# Patient Record
Sex: Female | Born: 1960 | Hispanic: Yes | Marital: Married | Smoking: Never smoker
Health system: Southern US, Community
[De-identification: ages and names within clinical notes are randomized; demographics above are authoritative.]

## PROBLEM LIST (undated history)

## (undated) DIAGNOSIS — C801 Malignant (primary) neoplasm, unspecified: Secondary | ICD-10-CM

## (undated) DIAGNOSIS — K219 Gastro-esophageal reflux disease without esophagitis: Secondary | ICD-10-CM

## (undated) DIAGNOSIS — J45909 Unspecified asthma, uncomplicated: Secondary | ICD-10-CM

## (undated) DIAGNOSIS — I10 Essential (primary) hypertension: Secondary | ICD-10-CM

## (undated) DIAGNOSIS — R911 Solitary pulmonary nodule: Secondary | ICD-10-CM

## (undated) HISTORY — DX: Essential (primary) hypertension: I10

## (undated) HISTORY — DX: Solitary pulmonary nodule: R91.1

## (undated) HISTORY — DX: Unspecified asthma, uncomplicated: J45.909

## (undated) HISTORY — PX: TUBAL LIGATION: SHX77

---

## 2015-08-16 ENCOUNTER — Encounter (HOSPITAL_COMMUNITY): Payer: Self-pay

## 2015-08-16 ENCOUNTER — Ambulatory Visit (HOSPITAL_COMMUNITY)
Admission: RE | Admit: 2015-08-16 | Discharge: 2015-08-16 | Disposition: A | Discharge status: Home / Self Care | Payer: Self-pay | Source: Ambulatory Visit | Attending: Obstetrics and Gynecology | Admitting: Obstetrics and Gynecology

## 2015-08-16 VITALS — BP 140/94 | Temp 97.7°F | Ht 59.84 in | Wt 239.4 lb

## 2015-08-16 DIAGNOSIS — N6315 Unspecified lump in the right breast, overlapping quadrants: Secondary | ICD-10-CM

## 2015-08-16 DIAGNOSIS — Z01419 Encounter for gynecological examination (general) (routine) without abnormal findings: Secondary | ICD-10-CM

## 2015-08-16 DIAGNOSIS — N6310 Unspecified lump in the right breast, unspecified quadrant: Secondary | ICD-10-CM

## 2015-08-16 DIAGNOSIS — N6320 Unspecified lump in the left breast, unspecified quadrant: Secondary | ICD-10-CM

## 2015-08-16 NOTE — Patient Instructions (Addendum)
Educational materials on self breast awareness given. Explained to Felicia [name] that [facility] will cover Pap smears and HPV typing every 5 years unless has a history of abnormal Pap smears. Referred patient to [facility] for bilateral breast biopsies per recommendation. Appointment scheduled for Monday, August 20, 2015 at 1200. Patient aware of appointment and will be there. Let patient know will follow up with her within the next couple weeks with results with of Pap smear by phone. Felicia [name] verbalized understanding.  [place], Arvil Chaco, RN 3:12 PM

## 2015-08-16 NOTE — Progress Notes (Signed)
Patient referred to [facility] by [facility] due to recommending bilateral breast biopsies. Diagnostic mammogram and bilateral breast ultrasounds completed 08/16/2015.  Pap Smear: Pap smear completed today. Last Pap smear was in 2012 in [place] and normal per patient. Per patient has no history of an abnormal Pap smear. No Pap smear results in EPIC.  Physical exam: Breasts Breasts symmetrical. No skin abnormalities right breast. Left inner breast erythema observed on exam. No nipple retraction left breast. Right nipple inverted that per patient first noticed 2 years ago. No nipple discharge bilateral breasts. No lymphadenopathy. Palpated a left breast mass at the lower inner quadrant of breast. Palpated a right breast mass at 12 o'clock. No complaints of pain or tenderness on exam. Referred patient to [facility] for bilateral breast biopsies per recommendation. Appointment scheduled for Monday, August 20, 2015 at 1200.  Pelvic/Bimanual   Ext Genitalia No lesions, no swelling and no discharge observed on external genitalia.         Vagina Vagina pink and normal texture. No lesions or discharge observed in vagina.          Cervix Cervix is present. Cervix pink and of normal texture. No discharge observed.     Uterus Uterus is present and palpable. Uterus in normal position and normal size.        Adnexae Bilateral ovaries present and palpable. No tenderness on palpation.          Rectovaginal No rectal exam completed today since patient had no rectal complaints. No skin abnormalities observed on exam.    Smoking History: Patient has never smoked.  Patient Navigation: Patient education provided. Access to services provided for patient through [facility] program. Spanish interpreter provided.  Colorectal Cancer Screening: Patient has never had a colonoscopy. No complaints today.  Used Spanish interpreter Cresenciano Genre at [facility]

## 2015-08-17 ENCOUNTER — Encounter (HOSPITAL_COMMUNITY): Payer: Self-pay | Admitting: *Deleted

## 2015-08-17 LAB — CYTOLOGY - PAP

## 2015-08-20 ENCOUNTER — Telehealth (HOSPITAL_COMMUNITY): Payer: Self-pay | Admitting: *Deleted

## 2015-08-20 ENCOUNTER — Other Ambulatory Visit: Payer: Self-pay | Admitting: Radiology

## 2015-08-20 NOTE — Telephone Encounter (Signed)
Telephoned patient at home # and discussed negative pap smear results. HPV was negative. Next pap smear due in 5 years. Patient voiced understanding.  

## 2015-08-21 ENCOUNTER — Telehealth: Payer: Self-pay | Admitting: *Deleted

## 2015-08-21 NOTE — Telephone Encounter (Signed)
Received notification from [facility] that all six of the patients biopsies are positive for breast cancer. [facility] will provide the pt will new pt appt to see Dr. Lindi Adie on 08/23/15 at 1pm. Pt arriving at 1pm at [facility] to discuss new pt appt and biopsy results.

## 2015-08-23 ENCOUNTER — Telehealth: Payer: Self-pay | Admitting: Hematology and Oncology

## 2015-08-23 ENCOUNTER — Encounter: Payer: Self-pay | Admitting: Hematology and Oncology

## 2015-08-23 ENCOUNTER — Encounter: Payer: Self-pay | Admitting: *Deleted

## 2015-08-23 ENCOUNTER — Other Ambulatory Visit (HOSPITAL_BASED_OUTPATIENT_CLINIC_OR_DEPARTMENT_OTHER): Payer: Self-pay

## 2015-08-23 ENCOUNTER — Ambulatory Visit (HOSPITAL_BASED_OUTPATIENT_CLINIC_OR_DEPARTMENT_OTHER): Payer: Self-pay | Admitting: Hematology and Oncology

## 2015-08-23 ENCOUNTER — Ambulatory Visit
Admission: RE | Admit: 2015-08-23 | Discharge: 2015-08-23 | Disposition: A | Discharge status: Home / Self Care | Payer: Self-pay | Source: Ambulatory Visit | Attending: Hematology and Oncology | Admitting: Hematology and Oncology

## 2015-08-23 VITALS — BP 174/82 | HR 89 | Temp 97.9°F | Resp 18 | Wt 238.9 lb

## 2015-08-23 DIAGNOSIS — C50412 Malignant neoplasm of upper-outer quadrant of left female breast: Secondary | ICD-10-CM

## 2015-08-23 DIAGNOSIS — C50411 Malignant neoplasm of upper-outer quadrant of right female breast: Secondary | ICD-10-CM

## 2015-08-23 DIAGNOSIS — C50811 Malignant neoplasm of overlapping sites of right female breast: Secondary | ICD-10-CM

## 2015-08-23 DIAGNOSIS — C50012 Malignant neoplasm of nipple and areola, left female breast: Secondary | ICD-10-CM | POA: Insufficient documentation

## 2015-08-23 DIAGNOSIS — C50812 Malignant neoplasm of overlapping sites of left female breast: Principal | ICD-10-CM

## 2015-08-23 DIAGNOSIS — C50912 Malignant neoplasm of unspecified site of left female breast: Secondary | ICD-10-CM

## 2015-08-23 DIAGNOSIS — Z170 Estrogen receptor positive status [ER+]: Secondary | ICD-10-CM | POA: Insufficient documentation

## 2015-08-23 DIAGNOSIS — C50911 Malignant neoplasm of unspecified site of right female breast: Secondary | ICD-10-CM

## 2015-08-23 DIAGNOSIS — C773 Secondary and unspecified malignant neoplasm of axilla and upper limb lymph nodes: Secondary | ICD-10-CM

## 2015-08-23 DIAGNOSIS — C50011 Malignant neoplasm of nipple and areola, right female breast: Secondary | ICD-10-CM | POA: Insufficient documentation

## 2015-08-23 LAB — CBC WITH DIFFERENTIAL/PLATELET
BASO%: 0.4 % (ref 0.0–2.0)
Basophils Absolute: 0 10*3/uL (ref 0.0–0.1)
EOS%: 1.2 % (ref 0.0–7.0)
Eosinophils Absolute: 0.1 10*3/uL (ref 0.0–0.5)
HEMATOCRIT: 46.4 % (ref 34.8–46.6)
HGB: 15.9 g/dL (ref 11.6–15.9)
LYMPH%: 27.3 % (ref 14.0–49.7)
MCH: 29.6 pg (ref 25.1–34.0)
MCHC: 34.3 g/dL (ref 31.5–36.0)
MCV: 86.4 fL (ref 79.5–101.0)
MONO#: 0.8 10*3/uL (ref 0.1–0.9)
MONO%: 8.9 % (ref 0.0–14.0)
NEUT#: 5.6 10*3/uL (ref 1.5–6.5)
NEUT%: 62.2 % (ref 38.4–76.8)
Platelets: 205 10*3/uL (ref 145–400)
RBC: 5.37 10*6/uL (ref 3.70–5.45)
RDW: 12.7 % (ref 11.2–14.5)
WBC: 9 10*3/uL (ref 3.9–10.3)
lymph#: 2.5 10*3/uL (ref 0.9–3.3)
nRBC: 0 % (ref 0–0)

## 2015-08-23 LAB — COMPREHENSIVE METABOLIC PANEL
ALT: 54 U/L (ref 0–55)
AST: 44 U/L — AB (ref 5–34)
Albumin: 4.1 g/dL (ref 3.5–5.0)
Alkaline Phosphatase: 74 U/L (ref 40–150)
Anion Gap: 9 mEq/L (ref 3–11)
BUN: 13.1 mg/dL (ref 7.0–26.0)
CALCIUM: 9.8 mg/dL (ref 8.4–10.4)
CHLORIDE: 106 meq/L (ref 98–109)
CO2: 24 mEq/L (ref 22–29)
Creatinine: 0.7 mg/dL (ref 0.6–1.1)
EGFR: >90 mL/min/{1.73_m2} (ref >90)
Glucose: 96 mg/dl (ref 70–140)
POTASSIUM: 4 meq/L (ref 3.5–5.1)
Sodium: 139 mEq/L (ref 136–145)
Total Bilirubin: 0.71 mg/dL (ref 0.20–1.20)
Total Protein: 8.4 g/dL — ABNORMAL HIGH (ref 6.4–8.3)

## 2015-08-23 MED ORDER — GADOBENATE DIMEGLUMINE 529 MG/ML IV SOLN
20.0000 mL | Freq: Once | INTRAVENOUS | Status: AC | PRN
  Administered 2015-08-23: 20 mL via INTRAVENOUS

## 2015-08-23 NOTE — Progress Notes (Signed)
Unable to get in to exam room prior to MD.  No assessment performed.  

## 2015-08-23 NOTE — Telephone Encounter (Signed)
Gave and printed appt shced and avs for pt for 5.10 with Dr. Donne Hazel.Marland KitchenMarland KitchenMarland Kitchen

## 2015-08-23 NOTE — Assessment & Plan Note (Signed)
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%; HER-2 negative ratio 1.04; 10:00: IDC grade 2-3 dissimilar to 1:00 biopsy 10:00: ER 95%, PR 0%, K67: 25%, HER-2 positive ratio 6.42 right axillary lymph node positive for metastatic cancer --------------------------------------------------------------------------------- Left breast LIQ 08/20/2015: 4.2 cm, 4 mm oval mass 12:00, 1.2 cm mass left UOQ, enlarged left axillary lymph node, T2 N1 stage II B clinical stage  Left biopsy 8:00: IDC with DCIS, grade 2; ER 95%, PR 95%, Ki-67 30%, HER-2 negative ratio 1.2; 12:00: IDC with DCIS, grade 1 ER 95%, PR 95%, Ki-67 10% HER-2 negative ratio 1.14 not similar to this biopsy, left axillary lymph node positive,  ---------------------------------------------------------------------------------. Pathology and radiology counseling: Discussed with the patient, the details of pathology including the type of breast cancer,the clinical staging, the significance of ER, PR and HER-2/neu receptors and the implications for treatment. After reviewing the pathology in detail, we proceeded to discuss the different treatment options between surgery, radiation, chemotherapy, antiestrogen therapies.  Recommendation: 1. Neoadjuvant chemotherapy with either TCH [place]jeta versus AC followed by TH (I requested HER-2 testing to be done on the lymph nodes. The lymph nodes are HER-2 positive then I would plan to give the patient [place]) 2. Followed by bilateral mastectomies and lymph node surgery 3. Followed by adjuvant radiation 4. Followed by adjuvant antiestrogen therapy  Plan: 1. Consult Dr. Donne Hazel with surgery 2. Port placement 3. Echocardiogram 4. Staging scans with CT scans and bone scan 5. Breast MRI  Return to clinic  in 1-2 weeks to discuss the results of the scans and to finalize a treatment.

## 2015-08-23 NOTE — Progress Notes (Signed)
[place] CONSULT NOTE  No care team member to display  CHIEF COMPLAINTS/PURPOSE OF CONSULTATION:  Newly diagnosed Bilateral breast cancer  HISTORY OF PRESENTING ILLNESS:  Felicia [name] 55 y.o. female is here because of recent diagnosis of bilateral breast cancers. She presented to [facility] with complaints of bilateral breast pain and she underwent a mammogram which revealed bilateral breast masses. This was further evaluated by ultrasound and biopsies of both the breast and bilateral axillary lymph nodes was performed. Pathology results were outlined below. We are asked to see the patient urgently because of the symptomatic nature of her disease. She is here today accompanied by her husband and her daughter. She tells me that 8 years ago she had a mammogram and because it was so painful she did not get another mammogram never. She reports of these nodules have been progressing over the past 2 months.  I reviewed her records extensively and collaborated the history with the patient.  SUMMARY OF ONCOLOGIC HISTORY:   Bilateral breast cancer ([place])   08/16/2015 Mammogram Left breast LIQ 4.2 cm, 4 mm oval mass 12:00, 1.2 cm mass left UOQ, enlarged left axillary lymph node   08/16/2015 Mammogram Right breast UIQ: 3.9 cm irregular mass, 1.5 cm mass in the right LOQ, 9 mm mass right UOQ, 6 mm mass right LIQ, enlarged lymph node Rt axilla   08/20/2015 Initial Diagnosis Right breast biopsy 1:00: IDC grade 2 ER 95%, PR 2%, Ki 67 15%; HER-2 negative ratio 1.04; 10:00: IDC grade 2-3 dissimilar to 1:00 biopsy 10:00: ER 95%, PR 0%, K67: 25%, HER-2 positive ratio 6.42 right axillary lymph node positive for metastatic cancer   08/20/2015 Procedure Left biopsy 8:00: IDC with DCIS, grade 2; ER 95%, PR 95%, Ki-67 30%, HER-2 negative ratio 1.2; 12:00: IDC with DCIS, grade 1 ER 95%, PR 95%, Ki-67 10% HER-2 negative ratio 1.14 not similar to this biopsy, left axillary lymph node positive,    MEDICAL  HISTORY:  Past Medical History  Diagnosis Date  . Diabetes mellitus without complication ([place])     SURGICAL HISTORY: Past Surgical History  Procedure Laterality Date  . Tubal ligation      SOCIAL HISTORY: Social History   Social History  . Marital Status: Married    Spouse Name: N/A  . Number of Children: N/A  . Years of Education: N/A   Occupational History  . Not on file.   Social History Main Topics  . Smoking status: Never Smoker   . Smokeless tobacco: Never Used  . Alcohol Use: No  . Drug Use: No  . Sexual Activity: Yes    Birth Control/ Protection: Surgical   Other Topics Concern  . Not on file   Social History Narrative  . No narrative on file    FAMILY HISTORY: Family History  Problem Relation Age of Onset  . Diabetes Mother     ALLERGIES:  has No Known Allergies.  MEDICATIONS:  Current Outpatient Prescriptions  Medication Sig Dispense Refill  . doxycycline (DORYX) 150 MG EC tablet Take 150 mg by mouth 2 (two) times daily.    . naproxen (NAPROSYN) 500 MG tablet Take 500 mg by mouth 2 (two) times daily with a meal.     No current facility-administered medications for this visit.    REVIEW OF SYSTEMS:   Constitutional: Denies fevers, chills or abnormal night sweats Eyes: Denies blurriness of vision, double vision or watery eyes Ears, nose, mouth, throat, and face: Denies mucositis or sore throat  Respiratory: Denies cough, dyspnea or wheezes Cardiovascular: Denies palpitation, chest discomfort or lower extremity swelling Gastrointestinal:  Denies nausea, heartburn or change in bowel habits Skin: Denies abnormal skin rashes Lymphatics: Denies new lymphadenopathy or easy bruising Neurological:Denies numbness, tingling or new weaknesses Behavioral/Psych: Mood is stable, no new changes  Breast: nodules in bilateral breasts All other systems were reviewed with the patient and are negative.  PHYSICAL EXAMINATION: ECOG PERFORMANCE STATUS: 0 -  Asymptomatic  Filed Vitals:   08/23/15 1257  BP: 174/82  Pulse: 89  Temp: 97.9 F (36.6 C)  Resp: 18   Filed Weights   08/23/15 1257  Weight: 238 lb 14.4 oz (108.364 kg)    GENERAL:alert, no distress and comfortable SKIN: skin color, texture, turgor are normal, no rashes or significant lesions EYES: normal, conjunctiva are pink and non-injected, sclera clear OROPHARYNX:no exudate, no erythema and lips, buccal mucosa, and tongue normal  NECK: supple, thyroid normal size, non-tender, without nodularity LYMPH:  no palpable lymphadenopathy in the cervical, axillary or inguinal LUNGS: clear to auscultation and percussion with normal breathing effort HEART: regular rate & rhythm and no murmurs and no lower extremity edema ABDOMEN:abdomen soft, non-tender and normal bowel sounds Musculoskeletal:no cyanosis of digits and no clubbing  PSYCH: alert & oriented x 3 with fluent speech NEURO: no focal motor/sensory deficits BREAST:large palpable nodules in bilateral breasts palpable bilateral axillary lymph nodes.the left breast shows the mass is much bigger. It is tender to palpation but there is no clear-cut evidence of inflammatory change.(exam performed in the presence of a chaperone)   LABORATORY DATA:  I have reviewed the data as listed Lab Results  Component Value Date   WBC 9.0 08/23/2015   HGB 15.9 08/23/2015   HCT 46.4 08/23/2015   MCV 86.4 08/23/2015   PLT 205 08/23/2015   Lab Results  Component Value Date   NA 139 08/23/2015   K 4.0 08/23/2015   CO2 24 08/23/2015    RADIOGRAPHIC STUDIES: I have personally reviewed the radiological reports and agreed with the findings in the report.  ASSESSMENT AND PLAN:  Bilateral breast cancer ([place]) 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%, PR  2%, Ki 67 15%; HER-2 negative ratio 1.04; 10:00: IDC grade 2-3 dissimilar to 1:00 biopsy 10:00: ER 95%, PR 0%, K67: 25%, HER-2 positive ratio 6.42 right axillary lymph node positive for metastatic cancer --------------------------------------------------------------------------------- Left breast LIQ 08/20/2015: 4.2 cm, 4 mm oval mass 12:00, 1.2 cm mass left UOQ, enlarged left axillary lymph node, T2 N1 stage II B clinical stage  Left biopsy 8:00: IDC with DCIS, grade 2; ER 95%, PR 95%, Ki-67 30%, HER-2 negative ratio 1.2; 12:00: IDC with DCIS, grade 1 ER 95%, PR 95%, Ki-67 10% HER-2 negative ratio 1.14 not similar to this biopsy, left axillary lymph node positive,  ---------------------------------------------------------------------------------. Pathology and radiology counseling: Discussed with the patient, the details of pathology including the type of breast cancer,the clinical staging, the significance of ER, PR and HER-2/neu receptors and the implications for treatment. After reviewing the pathology in detail, we proceeded to discuss the different treatment options between surgery, radiation, chemotherapy, antiestrogen therapies.  Recommendation: 1. Neoadjuvant chemotherapy with either TCH Perjeta versus AC followed by TH (I requested HER-2 testing to be done on the lymph nodes.  The lymph nodes are HER-2 positive then I would plan to give the patient [place]) 2. Followed by bilateral mastectomies and lymph node surgery 3. Followed by adjuvant radiation 4. Followed by adjuvant antiestrogen therapy  Plan: 1. Consult Dr. Donne Hazel with surgery 2. Port placement 3. Echocardiogram 4. Staging scans with CT scans and bone scan 5. Breast MRI  Return to clinic in 1-2 weeks to discuss the results of the scans and to finalize a treatment.   All questions were answered. The patient knows to call the clinic with any problems, questions or concerns.    Rulon Eisenmenger, MD 08/23/2015

## 2015-08-23 NOTE — Telephone Encounter (Signed)
Gave and printed appt sched and avs for pt for April °

## 2015-08-24 ENCOUNTER — Telehealth: Payer: Self-pay | Admitting: *Deleted

## 2015-08-24 ENCOUNTER — Ambulatory Visit (HOSPITAL_COMMUNITY)
Admission: RE | Admit: 2015-08-24 | Discharge: 2015-08-24 | Disposition: A | Discharge status: Home / Self Care | Payer: Self-pay | Source: Ambulatory Visit | Attending: Hematology and Oncology | Admitting: Hematology and Oncology

## 2015-08-24 DIAGNOSIS — C50812 Malignant neoplasm of overlapping sites of left female breast: Secondary | ICD-10-CM | POA: Insufficient documentation

## 2015-08-24 DIAGNOSIS — E119 Type 2 diabetes mellitus without complications: Secondary | ICD-10-CM | POA: Insufficient documentation

## 2015-08-24 DIAGNOSIS — C50811 Malignant neoplasm of overlapping sites of right female breast: Secondary | ICD-10-CM | POA: Insufficient documentation

## 2015-08-24 NOTE — Progress Notes (Signed)
  Echocardiogram 2D Echocardiogram has been performed.  Felicia [name] 08/24/2015, 11:30 AM

## 2015-08-24 NOTE — Telephone Encounter (Signed)
Gave pt instructions to [place] interpreter for CT/Bone scan.

## 2015-08-27 ENCOUNTER — Encounter (HOSPITAL_COMMUNITY)
Admission: RE | Admit: 2015-08-27 | Discharge: 2015-08-27 | Disposition: A | Discharge status: Home / Self Care | Payer: Self-pay | Source: Ambulatory Visit | Attending: Hematology and Oncology | Admitting: Hematology and Oncology

## 2015-08-27 ENCOUNTER — Encounter (HOSPITAL_COMMUNITY): Payer: Self-pay | Admitting: *Deleted

## 2015-08-27 ENCOUNTER — Encounter (HOSPITAL_COMMUNITY): Payer: Self-pay

## 2015-08-27 ENCOUNTER — Ambulatory Visit (HOSPITAL_COMMUNITY)
Admission: RE | Admit: 2015-08-27 | Discharge: 2015-08-27 | Disposition: A | Discharge status: Home / Self Care | Payer: Self-pay | Source: Ambulatory Visit | Attending: Hematology and Oncology | Admitting: Hematology and Oncology

## 2015-08-27 DIAGNOSIS — C50811 Malignant neoplasm of overlapping sites of right female breast: Secondary | ICD-10-CM

## 2015-08-27 DIAGNOSIS — C50812 Malignant neoplasm of overlapping sites of left female breast: Principal | ICD-10-CM

## 2015-08-27 DIAGNOSIS — R590 Localized enlarged lymph nodes: Secondary | ICD-10-CM | POA: Insufficient documentation

## 2015-08-27 DIAGNOSIS — R918 Other nonspecific abnormal finding of lung field: Secondary | ICD-10-CM | POA: Insufficient documentation

## 2015-08-27 MED ORDER — IOPAMIDOL (ISOVUE-300) INJECTION 61%: 100.0000 mL | Freq: Once | INTRAVENOUS | Status: DC | PRN

## 2015-08-27 MED ORDER — TECHNETIUM TC 99M MEDRONATE IV KIT
25.0000 | PACK | Freq: Once | INTRAVENOUS | Status: AC | PRN
  Administered 2015-08-27: 25 via INTRAVENOUS

## 2015-08-28 ENCOUNTER — Encounter: Payer: Self-pay | Admitting: *Deleted

## 2015-08-28 NOTE — Progress Notes (Signed)
Able to get pt earlier appt with Dr. Donne Hazel. New appt 08/29/15 at 1:00pm. Left vm with directions. Mcarthur Rossetti, [place] interpreter to request she leave msg in [place] with the new information as well.

## 2015-08-29 ENCOUNTER — Encounter (HOSPITAL_BASED_OUTPATIENT_CLINIC_OR_DEPARTMENT_OTHER): Payer: Self-pay | Admitting: *Deleted

## 2015-08-29 ENCOUNTER — Other Ambulatory Visit: Payer: Self-pay | Admitting: General Surgery

## 2015-08-30 ENCOUNTER — Telehealth: Payer: Self-pay | Admitting: Hematology and Oncology

## 2015-08-30 ENCOUNTER — Encounter: Payer: Self-pay | Admitting: Hematology and Oncology

## 2015-08-30 ENCOUNTER — Ambulatory Visit (HOSPITAL_BASED_OUTPATIENT_CLINIC_OR_DEPARTMENT_OTHER): Payer: Self-pay | Admitting: Hematology and Oncology

## 2015-08-30 VITALS — BP 148/71 | HR 62 | Temp 97.9°F | Resp 18 | Ht 59.0 in | Wt 239.2 lb

## 2015-08-30 DIAGNOSIS — C50812 Malignant neoplasm of overlapping sites of left female breast: Secondary | ICD-10-CM

## 2015-08-30 DIAGNOSIS — C50211 Malignant neoplasm of upper-inner quadrant of right female breast: Secondary | ICD-10-CM

## 2015-08-30 DIAGNOSIS — C50811 Malignant neoplasm of overlapping sites of right female breast: Secondary | ICD-10-CM

## 2015-08-30 DIAGNOSIS — C773 Secondary and unspecified malignant neoplasm of axilla and upper limb lymph nodes: Secondary | ICD-10-CM

## 2015-08-30 DIAGNOSIS — C50411 Malignant neoplasm of upper-outer quadrant of right female breast: Secondary | ICD-10-CM

## 2015-08-30 DIAGNOSIS — C50312 Malignant neoplasm of lower-inner quadrant of left female breast: Secondary | ICD-10-CM

## 2015-08-30 MED ORDER — LORAZEPAM 0.5 MG PO TABS: 0.5000 mg | ORAL_TABLET | Freq: Four times a day (QID) | ORAL | Status: DC | PRN

## 2015-08-30 MED ORDER — ONDANSETRON HCL 8 MG PO TABS: 8.0000 mg | ORAL_TABLET | Freq: Two times a day (BID) | ORAL | Status: DC | PRN

## 2015-08-30 MED ORDER — DEXAMETHASONE 4 MG PO TABS: 4.0000 mg | ORAL_TABLET | Freq: Every day | ORAL | Status: DC

## 2015-08-30 MED ORDER — LIDOCAINE-PRILOCAINE 2.5-2.5 % EX CREA: TOPICAL_CREAM | CUTANEOUS | Status: DC

## 2015-08-30 MED ORDER — PROCHLORPERAZINE MALEATE 10 MG PO TABS: 10.0000 mg | ORAL_TABLET | Freq: Four times a day (QID) | ORAL | Status: DC | PRN

## 2015-08-30 NOTE — Assessment & Plan Note (Signed)
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%; HER-2 negative ratio 1.04; 10:00: IDC grade 2-3 dissimilar to 1:00 biopsy 10:00: ER 95%, PR 0%, K67: 25%, HER-2 positive ratio 6.42 right axillary lymph node positive for metastatic cancer (LN is Her 2 neg) --------------------------------------------------------------------------------- Left breast LIQ 08/20/2015: 4.2 cm, 4 mm oval mass 12:00, 1.2 cm mass left UOQ, enlarged left axillary lymph node, T2 N1 stage II B clinical stage  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 CT CAP and Bone scan 08/27/15:  Bil Breast masses, Bil Axill LN, Bil lower lobe lung nodules indeterminate 7 mm  ---------------------------------------------------------------------------------------------------------------------------------------------. Treatment plan: 1. Neoadjuvant chemotherapy with either AC followed by THP  2. Followed by bilateral mastectomies and lymph node surgery 3. Followed by adjuvant radiation 4. Followed by adjuvant antiestrogen therapy -----------------------------------------------------------------------------------------------------------------------------------------------

## 2015-08-30 NOTE — Progress Notes (Signed)
Checked in patient to assist front staff. Patient uninsured but daughter states they have completed and submitted a FA application for [facility] on the hospital side. Introduced myself as Estate manager/land agent for any financial questions or concerns. Patient wanted to know if they would need to see me once the decision has been made for FA. Advised them to bring letter in when they receive it. Patient has my card for any additional questions. Attached armband and gave pager. Advised patient to sit on breast side.

## 2015-08-30 NOTE — Telephone Encounter (Signed)
appt made and avs printed °

## 2015-08-30 NOTE — Progress Notes (Signed)
Patient Care Team: No Pcp Per Patient as PCP - General (General Practice)  DIAGNOSIS: bilateral breast cancers with axillary lymphadenopathy  SUMMARY OF ONCOLOGIC HISTORY:   Bilateral breast cancer ([place])   08/16/2015 Mammogram Left breast LIQ 4.2 cm, 4 mm oval mass 12:00, 1.2 cm mass left UOQ, enlarged left axillary lymph node   08/16/2015 Mammogram Right breast UIQ: 3.9 cm irregular mass, 1.5 cm mass in the right LOQ, 9 mm mass right UOQ, 6 mm mass right LIQ, enlarged lymph node Rt axilla   08/20/2015 Initial Diagnosis Right breast biopsy 1:00: IDC grade 2 ER 95%, PR 2%, Ki 67 15%; HER-2 negative ratio 1.04; 10:00: IDC grade 2-3 dissimilar to 1:00 biopsy 10:00: ER 95%, PR 0%, K67: 25%, HER-2 positive ratio 6.42 right axillary lymph node positive for metastatic cancer   08/20/2015 Procedure Left biopsy 8:00: IDC with DCIS, grade 2; ER 95%, PR 95%, Ki-67 30%, HER-2 negative ratio 1.2; 12:00: IDC with DCIS, grade 1 ER 95%, PR 95%, Ki-67 10% HER-2 negative ratio 1.14 not similar to this biopsy, left axillary lymph node positive,     CHIEF COMPLIANT: patient here to discuss scans as well as treatment plan  INTERVAL HISTORY: Felicia [name] is a 55 year old with above-mentioned history of bilateral breast cancers who is here to discuss the results of the recent CT scans and to finalize the treatment plan. The CT scan showed 2 small lung nodules one in the right lower lobe and one in the left lower lobe. These were too small to be characterized. Apart from that no other evidence of metastatic disease. She had an echocardiogram which was normal. Breast MRI has also been completed. She will  Get a port placement next Tuesday.  REVIEW OF SYSTEMS:   Constitutional: Denies fevers, chills or abnormal weight loss Eyes: Denies blurriness of vision Ears, nose, mouth, throat, and face: Denies mucositis or sore throat Respiratory: Denies cough, dyspnea or wheezes Cardiovascular: Denies palpitation, chest  discomfort Gastrointestinal:  Denies nausea, heartburn or change in bowel habits Skin: Denies abnormal skin rashes Lymphatics: Denies new lymphadenopathy or easy bruising Neurological:Denies numbness, tingling or new weaknesses Behavioral/Psych: Mood is stable, no new changes  Extremities: No lower extremity edema Breast:  Bilateral masses in the rest along with lymphadenopathy All other systems were reviewed with the patient and are negative.  I have reviewed the past medical history, past surgical history, social history and family history with the patient and they are unchanged from previous note.  ALLERGIES:  has No Known Allergies.  MEDICATIONS:  Current Outpatient Prescriptions  Medication Sig Dispense Refill  . dexamethasone (DECADRON) 4 MG tablet Take 1 tablet (4 mg total) by mouth daily. 1 tab daily x 2 days start day after chemo 30 tablet 1  . lidocaine-prilocaine (EMLA) cream Apply to affected area once 30 g 3  . LORazepam (ATIVAN) 0.5 MG tablet Take 1 tablet (0.5 mg total) by mouth every 6 (six) hours as needed (Nausea or vomiting). 30 tablet 0  . naproxen (NAPROSYN) 500 MG tablet Take 500 mg by mouth 2 (two) times daily with a meal.    . ondansetron (ZOFRAN) 8 MG tablet Take 1 tablet (8 mg total) by mouth 2 (two) times daily as needed. Start on the third day after chemotherapy. 30 tablet 1  . prochlorperazine (COMPAZINE) 10 MG tablet Take 1 tablet (10 mg total) by mouth every 6 (six) hours as needed (Nausea or vomiting). 30 tablet 1   No current facility-administered medications for this  visit.    PHYSICAL EXAMINATION: ECOG PERFORMANCE STATUS: 1 - Symptomatic but completely ambulatory  Filed Vitals:   08/30/15 1159  BP: 148/71  Pulse: 62  Temp: 97.9 F (36.6 C)  Resp: 18   Filed Weights   08/30/15 1159  Weight: 239 lb 3.2 oz (108.5 kg)    GENERAL:alert, no distress and comfortable SKIN: skin color, texture, turgor are normal, no rashes or significant  lesions EYES: normal, Conjunctiva are pink and non-injected, sclera clear OROPHARYNX:no exudate, no erythema and lips, buccal mucosa, and tongue normal  NECK: supple, thyroid normal size, non-tender, without nodularity LYMPH:  no palpable lymphadenopathy in the cervical, axillary or inguinal LUNGS: clear to auscultation and percussion with normal breathing effort HEART: regular rate & rhythm and no murmurs and no lower extremity edema ABDOMEN:abdomen soft, non-tender and normal bowel sounds MUSCULOSKELETAL:no cyanosis of digits and no clubbing  NEURO: alert & oriented x 3 with fluent speech, no focal motor/sensory deficits EXTREMITIES: No lower extremity edema  LABORATORY DATA:  I have reviewed the data as listed   Chemistry      Component Value Date/Time   NA 139 08/23/2015 1407   K 4.0 08/23/2015 1407   CO2 24 08/23/2015 1407   BUN 13.1 08/23/2015 1407   CREATININE 0.7 08/23/2015 1407      Component Value Date/Time   CALCIUM 9.8 08/23/2015 1407   ALKPHOS 74 08/23/2015 1407   AST 44* 08/23/2015 1407   ALT 54 08/23/2015 1407   BILITOT 0.71 08/23/2015 1407       Lab Results  Component Value Date   WBC 9.0 08/23/2015   HGB 15.9 08/23/2015   HCT 46.4 08/23/2015   MCV 86.4 08/23/2015   PLT 205 08/23/2015   NEUTROABS 5.6 08/23/2015     ASSESSMENT & PLAN:  Bilateral breast cancer ([facility]) 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%; HER-2 negative ratio 1.04; 10:00: IDC grade 2-3 dissimilar to 1:00 biopsy 10:00: ER 95%, PR 0%, K67: 25%, HER-2 positive ratio 6.42 right axillary lymph node positive for metastatic cancer (LN is Her 2 neg) --------------------------------------------------------------------------------- Left breast LIQ 08/20/2015: 4.2 cm, 4 mm oval mass 12:00,  1.2 cm mass left UOQ, enlarged left axillary lymph node, T2 N1 stage II B clinical stage  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 CT CAP and Bone scan 08/27/15:  Bil Breast masses, Bil Axill LN, Bil lower lobe lung nodules indeterminate 7 mm  ---------------------------------------------------------------------------------------------------------------------------------------------. Treatment plan: 1. Neoadjuvant chemotherapy with either AC followed by TH (followed by breast MRI and CT chest regarding lung nodules before proceeding to surgery) 2. Followed by bilateral mastectomies and lymph node surgery 3. Followed by adjuvant radiation 4. Followed by adjuvant antiestrogen therapy ----------------------------------------------------------------------------------------------------------------------------------------------- Chemotherapy Counseling: I discussed the risks and benefits of chemotherapy including the risks of nausea/ vomiting, risk of infection from low WBC count, fatigue due to chemo or anemia, bruising or bleeding due to low platelets, mouth sores, loss/ change in taste and decreased appetite. Liver and kidney function will be monitored through out  chemotherapy as abnormalities in liver and kidney function may be a side effect of treatment. Cardiac dysfunction due to Adriamycin Herceptin were discussed in detail. Risk of permanent bone marrow dysfunction and leukemia due to chemo were also discussed.  Plan: 1. Port placement next Tuesday by Dr. Donne Hazel 2. Chemotherapy class 3. Start chemotherapy next Thursday Echocardiogram showed EF of 60-65%  I reviewed the results of the CT and bone scan. Based on tumor board recommendation, we are not performing PET CT scan at this time. We will  follow the lung nodules with a CT chest after the chemotherapy is complete.   Orders Placed This Encounter  Procedures  . CBC with Differential    Standing Status: Standing     Number of Occurrences: 20     Standing Expiration Date: 08/30/2016  . Comprehensive metabolic panel    Standing Status: Standing     Number of Occurrences: 20     Standing Expiration Date: 08/30/2016  . PHYSICIAN COMMUNICATION ORDER    A baseline Echo/ [place] should be obtained prior to initiation of Anthracycline Chemotherapy   The patient has a good understanding of the overall plan. she agrees with it. she will call with any problems that may develop before the next visit here.   Rulon Eisenmenger, MD 08/30/2015

## 2015-09-03 ENCOUNTER — Other Ambulatory Visit: Payer: Self-pay

## 2015-09-03 ENCOUNTER — Encounter: Payer: Self-pay | Admitting: *Deleted

## 2015-09-03 NOTE — Progress Notes (Signed)
Spent time with patient and her daughter in chemo class today with interpreter.  See education flowsheet

## 2015-09-04 ENCOUNTER — Ambulatory Visit (HOSPITAL_BASED_OUTPATIENT_CLINIC_OR_DEPARTMENT_OTHER)
Admission: RE | Admit: 2015-09-04 | Discharge: 2015-09-04 | Disposition: A | Discharge status: Home / Self Care | Payer: Self-pay | Source: Ambulatory Visit | Attending: General Surgery | Admitting: General Surgery

## 2015-09-04 ENCOUNTER — Ambulatory Visit (HOSPITAL_COMMUNITY): Payer: Self-pay

## 2015-09-04 ENCOUNTER — Ambulatory Visit (HOSPITAL_BASED_OUTPATIENT_CLINIC_OR_DEPARTMENT_OTHER): Payer: Self-pay | Admitting: Anesthesiology

## 2015-09-04 ENCOUNTER — Other Ambulatory Visit: Payer: Self-pay | Admitting: *Deleted

## 2015-09-04 ENCOUNTER — Encounter (HOSPITAL_BASED_OUTPATIENT_CLINIC_OR_DEPARTMENT_OTHER): Payer: Self-pay | Admitting: Anesthesiology

## 2015-09-04 ENCOUNTER — Encounter (HOSPITAL_BASED_OUTPATIENT_CLINIC_OR_DEPARTMENT_OTHER)
Admission: RE | Disposition: A | Discharge status: Home / Self Care | Payer: Self-pay | Source: Ambulatory Visit | Attending: General Surgery

## 2015-09-04 DIAGNOSIS — C50411 Malignant neoplasm of upper-outer quadrant of right female breast: Secondary | ICD-10-CM | POA: Insufficient documentation

## 2015-09-04 DIAGNOSIS — Z419 Encounter for procedure for purposes other than remedying health state, unspecified: Secondary | ICD-10-CM

## 2015-09-04 DIAGNOSIS — C50211 Malignant neoplasm of upper-inner quadrant of right female breast: Secondary | ICD-10-CM | POA: Insufficient documentation

## 2015-09-04 DIAGNOSIS — Z95828 Presence of other vascular implants and grafts: Secondary | ICD-10-CM

## 2015-09-04 DIAGNOSIS — C50912 Malignant neoplasm of unspecified site of left female breast: Secondary | ICD-10-CM | POA: Insufficient documentation

## 2015-09-04 DIAGNOSIS — Z6841 Body Mass Index (BMI) 40.0 and over, adult: Secondary | ICD-10-CM | POA: Insufficient documentation

## 2015-09-04 DIAGNOSIS — C50312 Malignant neoplasm of lower-inner quadrant of left female breast: Secondary | ICD-10-CM | POA: Insufficient documentation

## 2015-09-04 HISTORY — DX: Malignant (primary) neoplasm, unspecified: C80.1

## 2015-09-04 HISTORY — PX: PORTACATH PLACEMENT: SHX2246

## 2015-09-04 SURGERY — INSERTION, TUNNELED CENTRAL VENOUS DEVICE, WITH PORT
Anesthesia: General | Site: Chest

## 2015-09-04 MED ORDER — LACTATED RINGERS IV SOLN
INTRAVENOUS | Status: DC
  Administered 2015-09-04: 08:00:00 via INTRAVENOUS

## 2015-09-04 MED ORDER — CEFAZOLIN SODIUM-DEXTROSE 2-4 GM/100ML-% IV SOLN
INTRAVENOUS | Status: AC
Start: 2015-09-04 — End: 2015-09-04
  Filled 2015-09-04: qty 100

## 2015-09-04 MED ORDER — ONDANSETRON HCL 4 MG/2ML IJ SOLN
INTRAMUSCULAR | Status: DC | PRN
  Administered 2015-09-04: 4 mg via INTRAVENOUS

## 2015-09-04 MED ORDER — OXYCODONE HCL 5 MG PO TABS
ORAL_TABLET | ORAL | Status: AC
  Filled 2015-09-04: qty 1

## 2015-09-04 MED ORDER — CEFAZOLIN SODIUM-DEXTROSE 2-4 GM/100ML-% IV SOLN
2.0000 g | INTRAVENOUS | Status: AC
  Administered 2015-09-04: 2 g via INTRAVENOUS

## 2015-09-04 MED ORDER — LIDOCAINE HCL (CARDIAC) 20 MG/ML IV SOLN
INTRAVENOUS | Status: DC | PRN
  Administered 2015-09-04: 100 mg via INTRAVENOUS

## 2015-09-04 MED ORDER — BUPIVACAINE HCL (PF) 0.25 % IJ SOLN
INTRAMUSCULAR | Status: AC
  Filled 2015-09-04: qty 30

## 2015-09-04 MED ORDER — FENTANYL CITRATE (PF) 100 MCG/2ML IJ SOLN
INTRAMUSCULAR | Status: AC
  Filled 2015-09-04: qty 2

## 2015-09-04 MED ORDER — GLYCOPYRROLATE 0.2 MG/ML IJ SOLN: 0.2000 mg | Freq: Once | INTRAMUSCULAR | Status: DC | PRN

## 2015-09-04 MED ORDER — DEXAMETHASONE SODIUM PHOSPHATE 4 MG/ML IJ SOLN
INTRAMUSCULAR | Status: DC | PRN
  Administered 2015-09-04: 10 mg via INTRAVENOUS

## 2015-09-04 MED ORDER — HYDROCODONE-ACETAMINOPHEN 10-325 MG PO TABS: 1.0000 | ORAL_TABLET | Freq: Four times a day (QID) | ORAL | Status: DC | PRN

## 2015-09-04 MED ORDER — BUPIVACAINE HCL (PF) 0.25 % IJ SOLN
INTRAMUSCULAR | Status: DC | PRN
  Administered 2015-09-04: 10 mL

## 2015-09-04 MED ORDER — SCOPOLAMINE 1 MG/3DAYS TD PT72: 1.0000 | MEDICATED_PATCH | Freq: Once | TRANSDERMAL | Status: DC | PRN

## 2015-09-04 MED ORDER — HEPARIN SOD (PORK) LOCK FLUSH 100 UNIT/ML IV SOLN
INTRAVENOUS | Status: AC
  Filled 2015-09-04: qty 5

## 2015-09-04 MED ORDER — PROPOFOL 10 MG/ML IV BOLUS
INTRAVENOUS | Status: DC | PRN
Start: 2015-09-04 — End: 2015-09-04
  Administered 2015-09-04: 200 mg via INTRAVENOUS

## 2015-09-04 MED ORDER — OXYCODONE HCL 5 MG PO TABS
5.0000 mg | ORAL_TABLET | Freq: Once | ORAL | Status: AC | PRN
  Administered 2015-09-04: 5 mg via ORAL

## 2015-09-04 MED ORDER — FENTANYL CITRATE (PF) 100 MCG/2ML IJ SOLN
25.0000 ug | INTRAMUSCULAR | Status: DC | PRN
  Administered 2015-09-04: 50 ug via INTRAVENOUS

## 2015-09-04 MED ORDER — MIDAZOLAM HCL 2 MG/2ML IJ SOLN
1.0000 mg | INTRAMUSCULAR | Status: DC | PRN
  Administered 2015-09-04: 2 mg via INTRAVENOUS

## 2015-09-04 MED ORDER — HEPARIN (PORCINE) IN NACL 2-0.9 UNIT/ML-% IJ SOLN
INTRAMUSCULAR | Status: AC
  Filled 2015-09-04: qty 500

## 2015-09-04 MED ORDER — HEPARIN SOD (PORK) LOCK FLUSH 100 UNIT/ML IV SOLN
INTRAVENOUS | Status: DC | PRN
  Administered 2015-09-04: 500 [IU]

## 2015-09-04 MED ORDER — MIDAZOLAM HCL 2 MG/2ML IJ SOLN
INTRAMUSCULAR | Status: AC
  Filled 2015-09-04: qty 2

## 2015-09-04 MED ORDER — IOPAMIDOL (ISOVUE-300) INJECTION 61%
INTRAVENOUS | Status: AC
  Filled 2015-09-04: qty 50

## 2015-09-04 MED ORDER — FENTANYL CITRATE (PF) 100 MCG/2ML IJ SOLN
50.0000 ug | INTRAMUSCULAR | Status: DC | PRN
  Administered 2015-09-04 (×2): 50 ug via INTRAVENOUS

## 2015-09-04 MED ORDER — HEPARIN (PORCINE) IN NACL 2-0.9 UNIT/ML-% IJ SOLN
INTRAMUSCULAR | Status: DC | PRN
  Administered 2015-09-04: 1 via INTRAVENOUS

## 2015-09-04 SURGICAL SUPPLY — 52 items
BAG DECANTER FOR FLEXI CONT (MISCELLANEOUS) ×3 IMPLANT
BENZOIN TINCTURE PRP APPL 2/3 (GAUZE/BANDAGES/DRESSINGS) ×3 IMPLANT
BLADE SURG 11 STRL SS (BLADE) ×3 IMPLANT
BLADE SURG 15 STRL LF DISP TIS (BLADE) ×1 IMPLANT
BLADE SURG 15 STRL SS (BLADE) ×2
CANISTER SUCT 1200ML W/VALVE (MISCELLANEOUS) IMPLANT
CHLORAPREP W/TINT 26ML (MISCELLANEOUS) ×3 IMPLANT
CLOSURE WOUND 1/2 X4 (GAUZE/BANDAGES/DRESSINGS) ×1
COVER BACK TABLE 60X90IN (DRAPES) ×3 IMPLANT
COVER MAYO STAND STRL (DRAPES) ×3 IMPLANT
COVER PROBE 5X48 (MISCELLANEOUS) ×2
DECANTER SPIKE VIAL GLASS SM (MISCELLANEOUS) ×3 IMPLANT
DRAPE C-ARM 42X72 X-RAY (DRAPES) ×3 IMPLANT
DRAPE LAPAROSCOPIC ABDOMINAL (DRAPES) ×3 IMPLANT
DRAPE UTILITY XL STRL (DRAPES) ×3 IMPLANT
DRSG TEGADERM 4X4.75 (GAUZE/BANDAGES/DRESSINGS) IMPLANT
ELECT COATED BLADE 2.86 ST (ELECTRODE) ×3 IMPLANT
ELECT REM PT RETURN 9FT ADLT (ELECTROSURGICAL) ×3
ELECTRODE REM PT RTRN 9FT ADLT (ELECTROSURGICAL) ×1 IMPLANT
GLOVE BIO SURGEON STRL SZ7 (GLOVE) ×3 IMPLANT
GLOVE BIOGEL PI IND STRL 7.0 (GLOVE) ×2 IMPLANT
GLOVE BIOGEL PI IND STRL 7.5 (GLOVE) ×1 IMPLANT
GLOVE BIOGEL PI INDICATOR 7.0 (GLOVE) ×4
GLOVE BIOGEL PI INDICATOR 7.5 (GLOVE) ×2
GLOVE SURG SS PI 6.5 STRL IVOR (GLOVE) ×3 IMPLANT
GOWN STRL REUS W/ TWL LRG LVL3 (GOWN DISPOSABLE) ×2 IMPLANT
GOWN STRL REUS W/TWL LRG LVL3 (GOWN DISPOSABLE) ×4
IV KIT MINILOC 20X1 SAFETY (NEEDLE) IMPLANT
KIT CVR 48X5XPRB PLUP LF (MISCELLANEOUS) ×1 IMPLANT
KIT PORT POWER 8FR ISP CVUE (Catheter) ×3 IMPLANT
LIQUID BAND (GAUZE/BANDAGES/DRESSINGS) ×3 IMPLANT
NDL SAFETY ECLIPSE 18X1.5 (NEEDLE) IMPLANT
NEEDLE HYPO 18GX1.5 SHARP (NEEDLE)
NEEDLE HYPO 25X1 1.5 SAFETY (NEEDLE) ×3 IMPLANT
PACK BASIN DAY SURGERY FS (CUSTOM PROCEDURE TRAY) ×3 IMPLANT
PENCIL BUTTON HOLSTER BLD 10FT (ELECTRODE) ×3 IMPLANT
SLEEVE SCD COMPRESS KNEE MED (MISCELLANEOUS) ×3 IMPLANT
SPONGE GAUZE 4X4 12PLY STER LF (GAUZE/BANDAGES/DRESSINGS) ×3 IMPLANT
STRIP CLOSURE SKIN 1/2X4 (GAUZE/BANDAGES/DRESSINGS) ×2 IMPLANT
SUT MON AB 4-0 PC3 18 (SUTURE) ×3 IMPLANT
SUT PROLENE 2 0 SH DA (SUTURE) ×3 IMPLANT
SUT SILK 2 0 TIES 17X18 (SUTURE)
SUT SILK 2-0 18XBRD TIE BLK (SUTURE) IMPLANT
SUT VIC AB 3-0 SH 27 (SUTURE) ×2
SUT VIC AB 3-0 SH 27X BRD (SUTURE) ×1 IMPLANT
SYR 5ML LUER SLIP (SYRINGE) ×3 IMPLANT
SYR CONTROL 10ML LL (SYRINGE) ×3 IMPLANT
TOWEL OR 17X24 6PK STRL BLUE (TOWEL DISPOSABLE) ×3 IMPLANT
TOWEL OR NON WOVEN STRL DISP B (DISPOSABLE) ×3 IMPLANT
TUBE CONNECTING 20'X1/4 (TUBING)
TUBE CONNECTING 20X1/4 (TUBING) IMPLANT
YANKAUER SUCT BULB TIP NO VENT (SUCTIONS) IMPLANT

## 2015-09-04 NOTE — Anesthesia Postprocedure Evaluation (Signed)
Anesthesia Post Note  Patient: Felicia [name]  Procedure(s) Performed: Procedure(s) (LRB): INSERTION PORT-A-CATH WITH [place] (N/A)  Patient location during evaluation: PACU Anesthesia Type: General Level of consciousness: awake and alert Pain management: pain level controlled Vital Signs Assessment: post-procedure vital signs reviewed and stable Respiratory status: spontaneous breathing, nonlabored ventilation and respiratory function stable Cardiovascular status: blood pressure returned to baseline and stable Postop Assessment: no signs of nausea or vomiting Anesthetic complications: no    Last Vitals:  Filed Vitals:   09/04/15 1015 09/04/15 1030  BP: 143/78 134/78  Pulse: 50 56  Temp:    Resp: 13 23    Last Pain: There were no vitals filed for this visit.               Etty Isaac,W. EDMOND

## 2015-09-04 NOTE — Interval H&P Note (Signed)
History and Physical Interval Note:  09/04/2015 8:34 AM  Felicia [name]  has presented today for surgery, with the diagnosis of BILATERAL BREAST CANCER  The various methods of treatment have been discussed with the patient and family. After consideration of risks, benefits and other options for treatment, the patient has consented to  Procedure(s): INSERTION PORT-A-CATH WITH [place] (N/A) as a surgical intervention .  The patient's history has been reviewed, patient examined, no change in status, stable for surgery.  I have reviewed the patient's chart and labs.  Questions were answered to the patient's satisfaction.     Secily Walthour

## 2015-09-04 NOTE — Anesthesia Procedure Notes (Signed)
Procedure Name: LMA Insertion Date/Time: 09/04/2015 8:59 AM Performed by: Melynda Ripple D Pre-anesthesia Checklist: Patient identified, Emergency Drugs available, Suction available and Patient being monitored Patient Re-evaluated:Patient Re-evaluated prior to inductionOxygen Delivery Method: Circle System Utilized Preoxygenation: Pre-oxygenation with 100% oxygen Intubation Type: IV induction Ventilation: Mask ventilation without difficulty LMA: LMA inserted LMA Size: 5.0 Number of attempts: 1 Airway Equipment and Method: Bite block Placement Confirmation: positive ETCO2 and breath sounds checked- equal and bilateral Tube secured with: Tape Dental Injury: Teeth and Oropharynx as per pre-operative assessment

## 2015-09-04 NOTE — Op Note (Signed)
Preoperative diagnosis: breast cancer need for venous access Postoperative diagnosis: same as above Procedure: right ij US guided powerport insertion Surgeon: Dr Serita Grammes EBL: minimal Anes: general  Specimens none Complications none Drains none Sponge count correct Dispo to pacu stable  Indications: This is a 11 yof due to begin systemic therapy for breast cancer. We discussed port placement.   Procedure: After informed consent was obtained the patient was taken to the operating room. She was given antibiotics. Sequential compression devices were on her legs. She was then placed under general anesthesia with an LMA. Then she was then prepped and draped in the standard sterile surgical fashion. Surgical timeout was then performed.  I used the ultrasound to identify the right internal jugular vein. I then accessed the vein using the ultrasound. This aspirated blood. I then placed the wire.This was confirmed by fluoroscopy to be in the correct position. I created a pocket on the right chest. I tunneled the line between the 2 sites. I then dilated the tract and placed the dilator assembly with the sheath. This was done under fluoroscopy. I then removed the sheath and dilator. The wire was also removed. The line was then pulled back to be in the distal cava. I hooked this up to the port. I sutured this into place with 2-0 Prolene in 2 places. This aspirated blood and flushed easily.This was confirmed with a final fluoroscopy. I then closed this with 2-0 Vicryl and 4-0 Monocryl. Dermabond was placed on both the incisions.She tolerated this well and was transferred to the recovery room in stable condition.

## 2015-09-04 NOTE — H&P (Signed)
55 yof with no prior history and no family history of breast or ovarian cancer presents with new diagnosis bilateral node positive breast cancer. She is from [place]. she is here with her daughter today who is translating. they did not want to do translator service. she had breast pain and underwent mm that shows density b breasts. there was mass with spiculated margin and calcs at 12 oclock in the right breast. there is irregular mass in right breast laterally with mulitple nodules seen in right liq. there is mass at 12 oclock left breast, oval mass in liq of left breast and in left uoq. she had US guided biopsy of two right breast lesions, two left breast lesions and bilateral nodes. hydromark clips are in the nodes. the results are as follows: 1. left breast 8 oclock- idc with dcis,grade 2, 100/95/-/30% 2. left breast 12 oclock- idc dissimilar to #1, grade 1, 95/95/-/10% 3. left axillary node positive 4. right 1 oclock- idc, grade 2, 95/2/negative/15% 5. right 10 oclock- idc, grade 2-3, 95/0/positive/25% 6. right ax node- positive, her 2 negative she is here without any complaints today she has also undergone a mri that shows right breast with uiq that is spiculated and measures 2.5x4.2 cm, there are spicules of enhancment from this base to the right nipple with right nipple enhancement. there is also right breast mass within ruoq 1.6x1.7cm. there is also an additional lesion measuring 1.9x1.2 cm that has not been biopsied. there are mulitple small enhancing nodules. In the left breast in uiq there is spiculated enhancing mass measuring 4.2x6.6 cm. there is nipple enhancement and retraction. there are innumerable other masses present in this braest. there are also enlarged bilateral axillary nodes. ct shows bilateral breast masses, bilateral ax lad and bilateral ll pulm nodules measuring up to 7 mm which are indeterminate.    Other Problems Lars Mage Spillers, CMA; 08/29/2015 1:32  PM) Kidney Stone Lump In Breast  Past Surgical History Illene Regulus, CMA; 08/29/2015 1:32 PM) Breast Biopsy Bilateral.  Diagnostic Studies History (Alisha Spillers, CMA; 08/29/2015 1:32 PM) Mammogram >3 years ago Pap Smear 1-5 years ago  Allergies Lars Mage Spillers, CMA; 08/29/2015 1:35 PM) No Known Drug Allergies05/06/2015  Medication History (Alisha Spillers, CMA; 08/29/2015 1:35 PM) No Current Medications Medications Reconciled  Social History Illene Regulus, CMA; 08/29/2015 1:32 PM) No alcohol use No caffeine use No drug use Tobacco use Never smoker.  Family History Illene Regulus, CMA; 08/29/2015 1:32 PM) Diabetes Mellitus Mother.  Pregnancy / Birth History Illene Regulus, CMA; 08/29/2015 1:32 PM) Age at menarche 55 years. Age of menopause 20-55 Gravida 3 Maternal age 20-25 Para 3 Regular periods  Review of Systems Lars Mage Spillers CMA; 08/29/2015 1:32 PM) General Not Present- Appetite Loss, Chills, Fatigue, Fever, Night Sweats, Weight Gain and Weight Loss. Skin Not Present- Change in Wart/Mole, Dryness, Hives, Jaundice, New Lesions, Non-Healing Wounds, Rash and Ulcer. HEENT Present- Seasonal Allergies. Not Present- Earache, Hearing Loss, Hoarseness, Nose Bleed, Oral Ulcers, Ringing in the Ears, Sinus Pain, Sore Throat, Visual Disturbances, Wears glasses/contact lenses and Yellow Eyes. Breast Present- Breast Mass and Nipple Discharge. Not Present- Breast Pain and Skin Changes. Cardiovascular Not Present- Chest Pain, Difficulty Breathing Lying Down, Leg Cramps, Palpitations, Rapid Heart Rate, Shortness of Breath and Swelling of Extremities. Gastrointestinal Not Present- Abdominal Pain, Bloating, Bloody Stool, Change in Bowel Habits, Chronic diarrhea, Constipation, Difficulty Swallowing, Excessive gas, Gets full quickly at meals, Hemorrhoids, Indigestion, Nausea, Rectal Pain and Vomiting. Female Genitourinary Not Present- Frequency, Nocturia, Painful  Urination, Pelvic  Pain and Urgency. Musculoskeletal Not Present- Back Pain, Joint Pain, Joint Stiffness, Muscle Pain, Muscle Weakness and Swelling of Extremities. Neurological Not Present- Decreased Memory, Fainting, Headaches, Numbness, Seizures, Tingling, Tremor, Trouble walking and Weakness. Psychiatric Not Present- Anxiety, Bipolar, Change in Sleep Pattern, Depression, Fearful and Frequent crying.  Vitals (Alisha Spillers CMA; 08/29/2015 1:34 PM) 08/29/2015 1:34 PM Weight: 236 lb Height: 62in Body Surface Area: 2.05 m Body Mass Index: 43.16 kg/m  Pulse: 76 (Regular)  BP: 144/82 (Sitting, Left Arm, Standard) Physical Exam Rolm Bookbinder MD; 08/29/2015 2:45 PM) General Mental Status-Alert. Orientation-Oriented X3. Chest and Lung Exam Chest and lung exam reveals -on auscultation, normal breath sounds, no adventitious sounds and normal vocal resonance. Cardiovascular Cardiovascular examination reveals -normal heart sounds, regular rate and rhythm with no murmurs. Lymphatic Head & Neck General Head & Neck Lymphatics: Bilateral - Description - Normal. Note: no [place] adenopathy, bilateral mobile palpable nodes  Assessment & Plan Rolm Bookbinder MD; 08/29/2015 2:53 PM) BILATERAL BREAST CANCER (C50.911) Story: she has seen med onc already with plans to begin chemotherapy. we discussed surgery and its role upon completion and restaging. discussed port placment to begin chemotherapy which I will do next week.

## 2015-09-04 NOTE — Discharge Instructions (Signed)
PORT-A-CATH: POST OP INSTRUCTIONS  Always review your discharge instruction sheet given to you by the facility where your surgery was performed.   1. A prescription for pain medication may be given to you upon discharge. Take your pain medication as prescribed, if needed. If narcotic pain medicine is not needed, then you make take acetaminophen (Tylenol) or ibuprofen (Advil) as needed.  2. Take your usually prescribed medications unless otherwise directed. 3. If you need a refill on your pain medication, please contact our office. All narcotic pain medicine now requires a paper prescription.  Phoned in and fax refills are no longer allowed by law.  Prescriptions will not be filled after 5 pm or on weekends.  4. You should follow a light diet for the remainder of the day after your procedure. 5. Most patients will experience some mild swelling and/or bruising in the area of the incision. It may take several days to resolve. 6. It is common to experience some constipation if taking pain medication after surgery. Increasing fluid intake and taking a stool softener (such as Colace) will usually help or prevent this problem from occurring. A mild laxative (Milk of Magnesia or Miralax) should be taken according to package directions if there are no bowel movements after 48 hours.  7. Unless discharge instructions indicate otherwise, you may remove your bandages 48 hours after surgery, and you may shower at that time. You may have steri-strips (small white skin tapes) in place directly over the incision.  These strips should be left on the skin for 7-10 days.  If your surgeon used Dermabond (skin glue) on the incision, you may shower in 24 hours.  The glue will flake off over the next 2-3 weeks.  8. If your port is left accessed at the end of surgery (needle left in port), the dressing cannot get wet and should only by changed by a healthcare professional. When the port is no longer accessed (when the  needle has been removed), follow step 7.   9. ACTIVITIES:  Limit activity involving your arms for the next 72 hours. Do no strenuous exercise or activity for 1 week. You may drive when you are no longer taking prescription pain medication, you can comfortably wear a seatbelt, and you can maneuver your car. 10.You may need to see your doctor in the office for a follow-up appointment.  Please       check with your doctor.  11.When you receive a new Port-a-Cath, you will get a product guide and        ID card.  Please keep them in case you need them.  WHEN TO CALL YOUR DOCTOR [phone]): 1. Fever over 101.0 2. Chills 3. Continued bleeding from incision 4. Increased redness and tenderness at the site 5. Shortness of breath, difficulty breathing   The clinic staff is available to answer your questions during regular business hours. Please dont hesitate to call and ask to speak to one of the nurses or medical assistants for clinical concerns. If you have a medical emergency, go to the nearest emergency room or call 911.  A surgeon from [facility] is always on call at the hospital.     For further information, please visit [email or web address]    Post Anesthesia Home Care Instructions  Activity: Get plenty of rest for the remainder of the day. A responsible adult should stay with you for 24 hours following the procedure.  For the next 24 hours, DO NOT: -Drive a  car -Paediatric nurse -Drink alcoholic beverages -Take any medication unless instructed by your physician -Make any legal decisions or sign important papers.  Meals: Start with liquid foods such as gelatin or soup. Progress to regular foods as tolerated. Avoid greasy, spicy, heavy foods. If nausea and/or vomiting occur, drink only clear liquids until the nausea and/or vomiting subsides. Call your physician if vomiting continues.  Special Instructions/Symptoms: Your throat may feel dry or sore from  the anesthesia or the breathing tube placed in your throat during surgery. If this causes discomfort, gargle with warm salt water. The discomfort should disappear within 24 hours.  If you had a scopolamine patch placed behind your ear for the management of post- operative nausea and/or vomiting:  1. The medication in the patch is effective for 72 hours, after which it should be removed.  Wrap patch in a tissue and discard in the trash. Wash hands thoroughly with soap and water. 2. You may remove the patch earlier than 72 hours if you experience unpleasant side effects which may include dry mouth, dizziness or visual disturbances. 3. Avoid touching the patch. Wash your hands with soap and water after contact with the patch.   Call your surgeon if you experience:   1.  Fever over 101.0. 2.  Inability to urinate. 3.  Nausea and/or vomiting. 4.  Extreme swelling or bruising at the surgical site. 5.  Continued bleeding from the incision. 6.  Increased pain, redness or drainage from the incision. 7.  Problems related to your pain medication. 8.  Any problems and/or concerns

## 2015-09-04 NOTE — Anesthesia Preprocedure Evaluation (Addendum)
Anesthesia Evaluation  Patient identified by MRN, date of birth, ID band Patient awake    Reviewed: Allergy & Precautions, H&P , NPO status , Patient's Chart, lab work & pertinent test results  Airway Mallampati: II  TM Distance: >3 FB Neck ROM: Full    Dental no notable dental hx. (+) Teeth Intact, Dental Advisory Given   Pulmonary neg pulmonary ROS,    Pulmonary exam normal breath sounds clear to auscultation       Cardiovascular negative cardio ROS   Rhythm:Regular Rate:Normal     Neuro/Psych negative neurological ROS  negative psych ROS   GI/Hepatic negative GI ROS, Neg liver ROS,   Endo/Other  diabetesMorbid obesity  Renal/GU negative Renal ROS  negative genitourinary   Musculoskeletal   Abdominal   Peds  Hematology negative hematology ROS (+)   Anesthesia Other Findings   Reproductive/Obstetrics negative OB ROS                            Anesthesia Physical Anesthesia Plan  ASA: III  Anesthesia Plan: General   Post-op Pain Management:    Induction: Intravenous  Airway Management Planned: LMA  Additional Equipment:   Intra-op Plan:   Post-operative Plan: Extubation in OR  Informed Consent: I have reviewed the patients History and Physical, chart, labs and discussed the procedure including the risks, benefits and alternatives for the proposed anesthesia with the patient or authorized representative who has indicated his/her understanding and acceptance.   Dental advisory given  Plan Discussed with: CRNA  Anesthesia Plan Comments:         Anesthesia Quick Evaluation

## 2015-09-04 NOTE — Transfer of Care (Signed)
Immediate Anesthesia Transfer of Care Note  Patient: Felicia [name]  Procedure(s) Performed: Procedure(s): INSERTION PORT-A-CATH WITH [place] (N/A)  Patient Location: PACU  Anesthesia Type:General  Level of Consciousness: awake, alert  and oriented  Airway & Oxygen Therapy: Patient Spontanous Breathing and Patient connected to face mask oxygen  Post-op Assessment: Report given to RN and Post -op Vital signs reviewed and stable  Post vital signs: Reviewed and stable  Last Vitals:  Filed Vitals:   09/04/15 0758  BP: 150/88  Pulse: 58  Temp: 36.6 C  Resp: 18    Last Pain: There were no vitals filed for this visit.       Complications: No apparent anesthesia complications

## 2015-09-05 ENCOUNTER — Encounter (HOSPITAL_BASED_OUTPATIENT_CLINIC_OR_DEPARTMENT_OTHER): Payer: Self-pay | Admitting: General Surgery

## 2015-09-06 ENCOUNTER — Other Ambulatory Visit (HOSPITAL_BASED_OUTPATIENT_CLINIC_OR_DEPARTMENT_OTHER): Payer: Self-pay

## 2015-09-06 ENCOUNTER — Encounter: Payer: Self-pay | Admitting: *Deleted

## 2015-09-06 ENCOUNTER — Ambulatory Visit (HOSPITAL_BASED_OUTPATIENT_CLINIC_OR_DEPARTMENT_OTHER): Payer: Self-pay

## 2015-09-06 VITALS — BP 156/65 | HR 59 | Temp 97.6°F | Resp 20

## 2015-09-06 DIAGNOSIS — C50811 Malignant neoplasm of overlapping sites of right female breast: Secondary | ICD-10-CM

## 2015-09-06 DIAGNOSIS — C50211 Malignant neoplasm of upper-inner quadrant of right female breast: Secondary | ICD-10-CM

## 2015-09-06 DIAGNOSIS — C50411 Malignant neoplasm of upper-outer quadrant of right female breast: Secondary | ICD-10-CM

## 2015-09-06 DIAGNOSIS — Z5111 Encounter for antineoplastic chemotherapy: Secondary | ICD-10-CM

## 2015-09-06 DIAGNOSIS — C50312 Malignant neoplasm of lower-inner quadrant of left female breast: Secondary | ICD-10-CM

## 2015-09-06 DIAGNOSIS — C50812 Malignant neoplasm of overlapping sites of left female breast: Secondary | ICD-10-CM

## 2015-09-06 DIAGNOSIS — C773 Secondary and unspecified malignant neoplasm of axilla and upper limb lymph nodes: Secondary | ICD-10-CM

## 2015-09-06 LAB — CBC WITH DIFFERENTIAL/PLATELET
BASO%: 0.3 % (ref 0.0–2.0)
Basophils Absolute: 0 10*3/uL (ref 0.0–0.1)
EOS%: 0.4 % (ref 0.0–7.0)
Eosinophils Absolute: 0 10*3/uL (ref 0.0–0.5)
HEMATOCRIT: 43 % (ref 34.8–46.6)
HGB: 14.7 g/dL (ref 11.6–15.9)
LYMPH%: 34.3 % (ref 14.0–49.7)
MCH: 29.2 pg (ref 25.1–34.0)
MCHC: 34.2 g/dL (ref 31.5–36.0)
MCV: 85.3 fL (ref 79.5–101.0)
MONO#: 0.8 10*3/uL (ref 0.1–0.9)
MONO%: 7.8 % (ref 0.0–14.0)
NEUT#: 5.7 10*3/uL (ref 1.5–6.5)
NEUT%: 57.2 % (ref 38.4–76.8)
PLATELETS: 205 10*3/uL (ref 145–400)
RBC: 5.04 10*6/uL (ref 3.70–5.45)
RDW: 12.9 % (ref 11.2–14.5)
WBC: 10 10*3/uL (ref 3.9–10.3)
lymph#: 3.4 10*3/uL — ABNORMAL HIGH (ref 0.9–3.3)

## 2015-09-06 LAB — COMPREHENSIVE METABOLIC PANEL
ALT: 42 U/L (ref 0–55)
ANION GAP: 7 meq/L (ref 3–11)
AST: 29 U/L (ref 5–34)
Albumin: 3.7 g/dL (ref 3.5–5.0)
Alkaline Phosphatase: 74 U/L (ref 40–150)
BILIRUBIN TOTAL: 0.46 mg/dL (ref 0.20–1.20)
BUN: 13.4 mg/dL (ref 7.0–26.0)
CALCIUM: 9.4 mg/dL (ref 8.4–10.4)
CO2: 26 meq/L (ref 22–29)
CREATININE: 0.7 mg/dL (ref 0.6–1.1)
Chloride: 107 mEq/L (ref 98–109)
Glucose: 87 mg/dl (ref 70–140)
Potassium: 3.4 mEq/L — ABNORMAL LOW (ref 3.5–5.1)
Sodium: 140 mEq/L (ref 136–145)
TOTAL PROTEIN: 7.4 g/dL (ref 6.4–8.3)

## 2015-09-06 MED ORDER — SODIUM CHLORIDE 0.9 % IV SOLN
600.0000 mg/m2 | Freq: Once | INTRAVENOUS | Status: AC
  Administered 2015-09-06: 1280 mg via INTRAVENOUS
  Filled 2015-09-06: qty 64

## 2015-09-06 MED ORDER — SODIUM CHLORIDE 0.9 % IV SOLN
Freq: Once | INTRAVENOUS | Status: AC
  Administered 2015-09-06: 14:00:00 via INTRAVENOUS
  Filled 2015-09-06: qty 5

## 2015-09-06 MED ORDER — DOXORUBICIN HCL CHEMO IV INJECTION 2 MG/ML
60.0000 mg/m2 | Freq: Once | INTRAVENOUS | Status: AC
Start: 2015-09-06 — End: 2015-09-06
  Administered 2015-09-06: 128 mg via INTRAVENOUS
  Filled 2015-09-06: qty 64

## 2015-09-06 MED ORDER — HEPARIN SOD (PORK) LOCK FLUSH 100 UNIT/ML IV SOLN
500.0000 [IU] | Freq: Once | INTRAVENOUS | Status: AC | PRN
Start: 2015-09-06 — End: 2015-09-06
  Administered 2015-09-06: 500 [IU]
  Filled 2015-09-06: qty 5

## 2015-09-06 MED ORDER — PEGFILGRASTIM 6 MG/0.6ML ~~LOC~~ PSKT
6.0000 mg | PREFILLED_SYRINGE | Freq: Once | SUBCUTANEOUS | Status: AC
  Administered 2015-09-06: 6 mg via SUBCUTANEOUS
  Filled 2015-09-06: qty 0.6

## 2015-09-06 MED ORDER — SODIUM CHLORIDE 0.9 % IV SOLN
Freq: Once | INTRAVENOUS | Status: AC
  Administered 2015-09-06: 13:00:00 via INTRAVENOUS

## 2015-09-06 MED ORDER — PALONOSETRON HCL INJECTION 0.25 MG/5ML
INTRAVENOUS | Status: AC
  Filled 2015-09-06: qty 5

## 2015-09-06 MED ORDER — SODIUM CHLORIDE 0.9% FLUSH
10.0000 mL | INTRAVENOUS | Status: DC | PRN
  Administered 2015-09-06: 10 mL
  Filled 2015-09-06: qty 10

## 2015-09-06 MED ORDER — PALONOSETRON HCL INJECTION 0.25 MG/5ML
0.2500 mg | Freq: Once | INTRAVENOUS | Status: AC
  Administered 2015-09-06: 0.25 mg via INTRAVENOUS

## 2015-09-06 NOTE — Patient Instructions (Signed)
Felicia [name] Discharge Instructions for Patients Receiving Chemotherapy  Today you received the following chemotherapy agents adriamycin and cytoxan.  To help prevent nausea and vomiting after your treatment, we encourage you to take your nausea medication as prescribed by your physician.   If you develop nausea and vomiting that is not controlled by your nausea medication, call the clinic.   BELOW ARE SYMPTOMS THAT SHOULD BE REPORTED IMMEDIATELY:  *FEVER GREATER THAN 100.5 F  *CHILLS WITH OR WITHOUT FEVER  NAUSEA AND VOMITING THAT IS NOT CONTROLLED WITH YOUR NAUSEA MEDICATION  *UNUSUAL SHORTNESS OF BREATH  *UNUSUAL BRUISING OR BLEEDING  TENDERNESS IN MOUTH AND THROAT WITH OR WITHOUT PRESENCE OF ULCERS  *URINARY PROBLEMS  *BOWEL PROBLEMS  UNUSUAL RASH Items with * indicate a potential emergency and should be followed up as soon as possible.  Feel free to call the clinic you have any questions or concerns. The clinic phone number is [phone].  Please show the [place] at check-in to the Emergency Department and triage nurse.  Doxorubicin injection Qu es este medicamento? La DOXORRUBICINA es un agente quimioteraputico. Se utiliza para el tratamiento de muchos tipos de cnceres, como enfermedad de Hodgkin, leucemia, linfoma no Hodgkin, neuroblastoma, sarcoma y el tumor de [place]. Se utiliza tambin para el tratamiento del cncer de vejiga, mama, pulmn, ovarios, estmago, y tiroides. Este medicamento puede ser utilizado para otros usos; si tiene alguna pregunta consulte con su proveedor de atencin mdica o con su farmacutico. Qu le debo informar a mi profesional de la salud antes de tomar este medicamento? Necesita saber si usted presenta alguno de los siguientes problemas o situaciones: -trastornos sanguneos -enfermedad cardiaca, ataque cardiaco reciente -infeccin (especialmente infecciones virales, como varicela o herpes) -latidos cardacos  irregulares -enfermedad heptica -radioterapia reciente o continuada -una reaccin alrgica o inusual a la doxorrubicina, a otros agentes quimioteraputicos, a otros medicamentos, alimentos, colorantes o conservantes -si est embarazada o buscando quedar embarazada -si est amamantando a un beb Cmo debo utilizar este medicamento? Este medicamento se administra mediante infusin por va intravenosa. Lo administra un profesional de la salud calificado en un hospital o en un entorno clnico. Si experimenta dolor, hinchazn, ardor o cualquier sensacin inusual alrededor del lugar de la inyeccin, informe inmediatamente a su profesional de [employer]	[place]. Hable con su pediatra para informarse acerca del uso de este medicamento en nios. Puede requerir atencin especial. Sobredosis: Pngase en contacto inmediatamente con un centro toxicolgico o una sala de urgencia si usted cree que haya tomado demasiado medicamento. ATENCIN: [employer] es solo para usted. No comparta este medicamento con nadie. Qu sucede si me olvido de una dosis? Es importante no olvidar ninguna dosis. Informe a su mdico o a su profesional de la salud si no puede asistir a Photographer. Qu puede interactuar con este medicamento? No tome esta medicina con ninguno de los siguientes medicamentos: -cisapride -droperidol -halofantrina -pimozida -zidovudina Esta medicina tambin puede interactuar con los siguientes medicamentos: -cloroquina -clorpromacina -claritromicina -ciclofosfamida -ciclosporina -eritromicina -medicamentos para la depresin, ansiedad o trastornos psicticos -medicamentos utilizados para el pulso cardiaco irregular, tales como amiodarona, bepridil, dofetilida, encainida, flecainida, propafenona, quinidina -medicamentos para convulsiones, tales como etotona, fosfenitona, fenitona -medicamentos para las nuseas, vmito, tales como dolasetrn, [place], Contractor -medicamentos para incrementar  los conteos sanguneos, tales como filgrastim, pegfilgrastim, sargramostim -metadona -metotrexato -pentamidina -progesterona -vacunas -verapamilo Consulte a su mdico o a su profesional de la salud antes de tomar cualquiera de los siguientes medicamentos: -acetaminofeno -aspirina -ibuprofeno -quetoprofeno -naproxeno Puede  ser que esta lista no menciona todas las posibles interacciones. Informe a su profesional de [employer]	[place] de [employer] productos a base de hierbas, medicamentos de [place] o suplementos nutritivos que est tomando. Si usted fuma, consume bebidas alcohlicas o si utiliza drogas ilegales, indqueselo tambin a su profesional de [employer]	[place]. Algunas sustancias pueden interactuar con su medicamento. A qu debo estar atento al usar [employer]? Se supervisar su condicin atentamente mientras reciba este medicamento. Tendr que hacerse anlisis de sangre peridicos mientras recibe [place]. Este medicamento puede hacerle sentir un Nurse, mental health. Esto es normal ya que la quimioterapia afecta tanto a las clulas sanas como a las clulas cancerosas. Si presenta alguno de los [employer], infrmelos. Sin embargo, contine con el tratamiento aun si se siente enfermo, a menos que su mdico le indique que lo suspenda. Despus de varios das de [place] orina puede ser de color rojo. Esto no significa sangre en la orina. Si su orina es un color oscuro o Forensic psychologist, comunquese con su mdico. En algunos casos, podr recibir [employer] para ayudarle con los efectos secundarios. [place] para usar. Consulte a su mdico o a su profesional de la salud por asesoramiento si tiene fiebre, escalofros, dolor de garganta o cualquier otro sntoma de resfro o gripe. No se trate usted mismo. Este medicamento puede reducir la capacidad del cuerpo para combatir infecciones. Trate de no acercarse a personas que estn enfermas. [employer] puede  aumentar el riesgo de magulladuras o sangrado. Consulte a su mdico o a su profesional de la salud si observa sangrados inusuales. Proceda con cuidado al cepillar sus dientes, usar hilo dental o Risk manager palillos para los dientes, ya que puede contraer una infeccin o Therapist, art con mayor facilidad. Si se somete a algn tratamiento dental, informe a su dentista que est [employer]. Evite tomar productos que contienen aspirina, acetaminofeno, ibuprofeno, naproxeno o quetoprofeno a menos que as lo indique su mdico. Estos productos pueden disimular la fiebre. Los hombres y las mujeres en edad de procrear deben Risk manager mtodos anticonceptivos eficaces mientras reciben este medicamento. No se debe quedar embarazada mientras reciben [employer]. Existe la posibilidad de efectos secundarios graves a un beb sin nacer. Para ms informacin hable con su profesional de la salud o su farmacutico. No debe amamantar a un beb mientras est tomando este medicamento. Evite que otras personas tocan su orina u otros fluidos corporales por lo menos 5 das despus de cada tratamiento. Quienes cuidan de los pacientes deben Risk manager guantes de ltex para English as a second language teacher contacto con la [place] y otros fluidos corporales durante este tiempo. Existe una cantidad mxima de este medicamento que debe recibir a lo largo de su vida. La cantidad depende de la afeccin mdica siendo tratado y su salud en general. Su mdico observar la cantidad de este medicamento que usted recibe en su vida. Informe a su mdico si usted ha tomado este medicamento antes. Qu efectos secundarios puedo tener al [employer] este medicamento? Efectos secundarios que debe informar a su mdico o a Barrister's clerk de la salud tan pronto como sea posible: -reacciones alrgicas como erupcin cutnea, picazn o urticarias, hinchazn de la cara, labios o lengua -conteos sanguneos bajos - este medicamento puede reducir la cantidad de glbulos blancos,  glbulos rojos y plaquetas. Su riesgo de infeccin y [place]. -signos de infeccin - fiebre o escalofros, tos, dolor de garganta, dolor o dificultad para orinar -signos de reduccin de plaquetas o sangrado -  magulladuras, puntos rojos en la piel, heces de color oscuro o con aspecto alquitranado, sangre en la orina -signos de reduccin de glbulos rojos - cansancio o debilidad inusual, desmayos, sensacin de mareo -problemas respiratorios -dolor en el pecho -pulso cardiaco rpido, irregular -llagas en la boca -nuseas, vmito -dolor, enrojecimiento, hinchazn en el lugar de la inyeccin -dolor, hormigueo, entumecimiento de manos o pies -hinchazn de tobillos, pies o manos -sangrado, magulladuras inusuales Efectos secundarios que, por lo general, no requieren Geophysical data processor (debe informarlos a su mdico o a su profesional de la salud si persisten o si son molestos): -diarrea -enrojecimiento de la cara -cada del cabello -prdida del apetito -ausencia de perodos menstruales -dao o Glass blower/designer de las uas -ojos rojos o llorosos -color rojo de la orina -Higher education careers adviser Puede ser que esta lista no menciona todos los posibles efectos secundarios. Comunquese a su mdico por asesoramiento mdico [employer] Usted puede informar los efectos secundarios a la FDA por telfono al 1-800-FDA-1088. Dnde debo guardar mi medicina? Este medicamento se administra en hospitales o clnicas y no necesitar guardarlo en su domicilio. ATENCIN: Este folleto es un resumen. Puede ser que no cubra toda la posible informacin. Si usted tiene preguntas acerca de esta medicina, consulte con su mdico, su farmacutico o su profesional de Technical sales engineer.    2016, Elsevier/Gold Standard. (2014-06-06 00:00:00)  Cyclophosphamide injection Qu es este medicamento? La CICLOFOSFAMIDA es un agente quimioteraputico. Este medicamento reduce el crecimiento de las clulas cancerosas.  Este medicamento se [place] en el tratamiento de varios tipos de cncer como linfoma, mieloma, leucemia, cncer de mama y cncer de ovarios, por ejemplo. Este medicamento puede ser utilizado para otros usos; si tiene alguna pregunta consulte con su proveedor de atencin mdica o con su farmacutico. Qu le debo informar a mi profesional de la salud antes de tomar este medicamento? Necesita saber si usted presenta alguno de los siguientes problemas o situaciones: -trastorno sanguneo -antecedentes de quimioterapia -infeccin -enfermedad renal -enfermedad heptica -radioterapia reciente o en curso -tumors en la mdula sea -una reaccin alrgica o inusual a la ciclofosfamida, a otros agentes quimioteraputicos, a otros medicamentos, alimentos, colorantes o conservantes -si est embarazada o buscando quedar embarazada -si est amamantando a un beb Cmo debo utilizar este medicamento? Este medicamento normalmente se administra mediante inyeccin por va intravenosa o intramuscular o infusin por va intravenosa. Lo administra un profesional de la salud calificado en un hospital o en un entorno clnico. Hable con su pediatra para informarse acerca del uso de este medicamento en nios. Puede requerir atencin especial. Sobredosis: Pngase en contacto inmediatamente con un centro toxicolgico o una sala de urgencia si usted cree que haya tomado demasiado medicamento. ATENCIN: [employer] es solo para usted. No comparta este medicamento con nadie. Qu sucede si me olvido de una dosis? Es importante no olvidar ninguna dosis. Informe a su mdico o a su profesional de la salud si no puede asistir a Photographer. Qu puede interactuar con este medicamento? Esta medicina puede interactuar con los siguientes medicamentos: -amiodarona -anfotericina B -azatioprina -ciertos medicamentos antivricos para el VIH o SIDA, tales como inhibidores de la proteasa (indinavir, ritonavir) y zidovudina -ciertos  medicamentos para la presin sangunea, tales como benazepril, captopril, enalapril, fosinopril, lisinopril, moexipril, monopril, perindopril, quinapril, ramipril, trandolapril -ciertos medicamentos para el cncer tales como antraciclnicos (daunorrubicina, doxorrubicina), busulfn, citarabina, paclitaxel, pentostatina, tamoxifeno, trastuzumab -ciertos diurticos, tales como clorotiazida, clortalidona, hidroclorotiazida, indapamida, metolazona -ciertos medicamentos que tratan o previenen cogulos sanguneos, como warfarina -ciertos relajantes musculares,  tales como succinilcolina -ciclosporina -etanercept -indometacina -medicamentos para incrementar los conteos sanguneos, tales como filgrastim, pegfilgrastim, sargramostim -medicamentos utilizados para la anestesia general -metronidazol -natalizumab Puede ser que esta lista no menciona todas las posibles interacciones. Informe a su profesional de [employer]	[place] de [employer] productos a base de hierbas, medicamentos de [place] o suplementos nutritivos que est tomando. Si usted fuma, consume bebidas alcohlicas o si utiliza drogas ilegales, indqueselo tambin a su profesional de [employer]	[place]. Algunas sustancias pueden interactuar con su medicamento. A qu debo estar atento al usar Coca[employer]Cola? Visite a su mdico para chequear su evolucin. Este medicamento puede hacerle sentir un Nurse, mental health. Esto no es raro ya que la quimioterapia afecta tanto a las clulas sanas como a las clulas cancerosas. Si presenta alguno de los [employer], infrmelos. Sin embargo, contine con el tratamiento aun si se siente enfermo, a menos que su mdico le indique que lo suspenda. Beba agua u otros lquidos como se le haya indicado. Orine con frecuencia, especialmente de noche. En algunos casos, podr recibir [employer] para ayudarle con los efectos secundarios. Siga las instrucciones de [place]. Consulte a su mdico o a su profesional de la salud  por asesoramiento si tiene fiebre, escalofros, dolor de garganta o cualquier otro sntoma de resfro o gripe. No se trate usted mismo. Este medicamento puede reducir la capacidad del cuerpo para combatir infecciones. Trate de no acercarse a personas que estn enfermas. [employer] puede aumentar el riesgo de magulladuras o sangrado. Consulte a su mdico o a su profesional de la salud si nota sangrados inusuales. Proceda con cuidado al cepillar sus dientes, usar hilo dental o Risk manager palillos para los dientes, ya que puede contraer una infeccin o Therapist, art con mayor facilidad. Si se somete a algn tratamiento dental, informe a su dentista que est recibiendo [employer]. Puede experimentar mareos o somnolencia. No conduzca ni utilice maquinaria ni haga nada que Associate Professor en estado de alerta hasta que sepa cmo le afecta este medicamento. No se debe quedar embarazada mientras recibe este medicamento o durante 1 ao despus de terminarlo. Las mujeres deben informar a su mdico si estn buscando quedar embarazadas o si creen que estn embarazadas. Los hombres no deben tener hijos mientras estn recibiendo [employer] y durante 4 meses despus de terminarlo. Existe la posibilidad de efectos secundarios graves a un beb sin nacer. Para ms informacin hable con su profesional de la salud o su farmacutico. No debe Economist a un beb mientras est usando este medicamento. Este medicamento puede interferir con la capacidad de tener hijos. En algunas mujeres, este medicamento ha causado insuficiencia ovrica. En algunos hombres, este medicamento ha causado reduccin de los conteos de [place]. Usted debe consultarse con su mdico o su profesional de la salud si est preocupada por su fertilidad. Si va a someterse a una operacin, informe a su mdico o a su profesional de la salud que ha [employer]. Qu efectos secundarios puedo tener al [employer] este medicamento? Efectos  secundarios que debe informar a su mdico o a Barrister's clerk de la salud tan pronto como sea posible: -reacciones alrgicas como erupcin cutnea, picazn o urticarias, hinchazn de la cara, labios o lengua -conteos sanguneos bajos - este medicamento puede reducir la cantidad de glbulos blancos, glbulos rojos y plaquetas. Su riesgo de infeccin y [place]. -signos de infeccin - fiebre o escalofros, tos, dolor de garganta, dolor o dificultad para orinar -signos de reduccin de plaquetas o sangrado -  magulladuras, puntos rojos en la piel, heces de color oscuro o con aspecto alquitranado, sangre en la orina -signos de reduccin de glbulos rojos - cansancio o debilidad inusual, desmayos, aturdimiento -problemas respiratorios -orina oscura -mareos -palpitaciones -hinchazn de tobillos, pies o manos -dificultad para orinar o cambios en el volumen de orina -aumento de peso -color amarillento de los ojos o la piel Efectos secundarios que, por lo general, no requieren Geophysical data processor (debe informarlos a su mdico o a su profesional de la salud si persisten o si son molestos): -cambios en el color de las uas o piel -cada del cabello -ausencia de perodos menstruales -llagas en la boca -nuseas, vmito Puede ser que [employer] no menciona todos los posibles efectos secundarios. Comunquese a su mdico por asesoramiento mdico [employer] Usted puede informar los efectos secundarios a la FDA por telfono al 1-800-FDA-1088. Dnde debo guardar mi medicina? Este medicamento se administra en hospitales o clnicas y no necesitar guardarlo en su domicilio. ATENCIN: Este folleto es un resumen. Puede ser que no cubra toda la posible informacin. Si usted tiene preguntas acerca de esta medicina, consulte con su mdico, su farmacutico o su profesional de Technical sales engineer.    2016, Elsevier/Gold Standard. (2014-06-06 00:00:00)

## 2015-09-13 ENCOUNTER — Other Ambulatory Visit (HOSPITAL_BASED_OUTPATIENT_CLINIC_OR_DEPARTMENT_OTHER): Payer: Self-pay

## 2015-09-13 ENCOUNTER — Encounter: Payer: Self-pay | Admitting: Hematology and Oncology

## 2015-09-13 ENCOUNTER — Ambulatory Visit (HOSPITAL_BASED_OUTPATIENT_CLINIC_OR_DEPARTMENT_OTHER): Payer: Self-pay | Admitting: Hematology and Oncology

## 2015-09-13 VITALS — BP 123/75 | HR 86 | Temp 98.1°F | Resp 18 | Wt 235.6 lb

## 2015-09-13 DIAGNOSIS — C50411 Malignant neoplasm of upper-outer quadrant of right female breast: Secondary | ICD-10-CM

## 2015-09-13 DIAGNOSIS — R5383 Other fatigue: Secondary | ICD-10-CM

## 2015-09-13 DIAGNOSIS — C50812 Malignant neoplasm of overlapping sites of left female breast: Secondary | ICD-10-CM

## 2015-09-13 DIAGNOSIS — C50811 Malignant neoplasm of overlapping sites of right female breast: Secondary | ICD-10-CM

## 2015-09-13 DIAGNOSIS — C50312 Malignant neoplasm of lower-inner quadrant of left female breast: Secondary | ICD-10-CM

## 2015-09-13 DIAGNOSIS — R918 Other nonspecific abnormal finding of lung field: Secondary | ICD-10-CM

## 2015-09-13 DIAGNOSIS — R110 Nausea: Secondary | ICD-10-CM

## 2015-09-13 DIAGNOSIS — C773 Secondary and unspecified malignant neoplasm of axilla and upper limb lymph nodes: Secondary | ICD-10-CM

## 2015-09-13 DIAGNOSIS — C50211 Malignant neoplasm of upper-inner quadrant of right female breast: Secondary | ICD-10-CM

## 2015-09-13 DIAGNOSIS — R51 Headache: Secondary | ICD-10-CM

## 2015-09-13 LAB — CBC WITH DIFFERENTIAL/PLATELET
BASO%: 0.5 % (ref 0.0–2.0)
Basophils Absolute: 0 10*3/uL (ref 0.0–0.1)
EOS%: 1.8 % (ref 0.0–7.0)
Eosinophils Absolute: 0.1 10*3/uL (ref 0.0–0.5)
HCT: 40.9 % (ref 34.8–46.6)
HGB: 14 g/dL (ref 11.6–15.9)
LYMPH%: 29.3 % (ref 14.0–49.7)
MCH: 29 pg (ref 25.1–34.0)
MCHC: 34.2 g/dL (ref 31.5–36.0)
MCV: 84.9 fL (ref 79.5–101.0)
MONO#: 0.2 10*3/uL (ref 0.1–0.9)
MONO%: 6.1 % (ref 0.0–14.0)
NEUT%: 62.3 % (ref 38.4–76.8)
NEUTROS ABS: 2.5 10*3/uL (ref 1.5–6.5)
NRBC: 0 % (ref 0–0)
PLATELETS: 127 10*3/uL — AB (ref 145–400)
RBC: 4.82 10*6/uL (ref 3.70–5.45)
RDW: 12.5 % (ref 11.2–14.5)
WBC: 4 10*3/uL (ref 3.9–10.3)
lymph#: 1.2 10*3/uL (ref 0.9–3.3)

## 2015-09-13 LAB — COMPREHENSIVE METABOLIC PANEL
ALT: 24 U/L (ref 0–55)
ANION GAP: 6 meq/L (ref 3–11)
AST: 14 U/L (ref 5–34)
Albumin: 3.5 g/dL (ref 3.5–5.0)
Alkaline Phosphatase: 95 U/L (ref 40–150)
BUN: 9.5 mg/dL (ref 7.0–26.0)
CHLORIDE: 106 meq/L (ref 98–109)
CO2: 23 meq/L (ref 22–29)
Calcium: 9.3 mg/dL (ref 8.4–10.4)
Creatinine: 0.6 mg/dL (ref 0.6–1.1)
EGFR: >90 mL/min/{1.73_m2} (ref >90)
GLUCOSE: 163 mg/dL — AB (ref 70–140)
Potassium: 3.7 mEq/L (ref 3.5–5.1)
SODIUM: 136 meq/L (ref 136–145)
TOTAL PROTEIN: 7 g/dL (ref 6.4–8.3)
Total Bilirubin: 0.71 mg/dL (ref 0.20–1.20)

## 2015-09-13 NOTE — Progress Notes (Signed)
Patient Care Team: No Pcp Per Patient as PCP - General (General Practice)   SUMMARY OF ONCOLOGIC HISTORY:   Bilateral breast cancer ([facility])   08/16/2015 Mammogram Left breast LIQ 4.2 cm, 4 mm oval mass 12:00, 1.2 cm mass left UOQ, enlarged left axillary lymph node   08/16/2015 Mammogram Right breast UIQ: 3.9 cm irregular mass, 1.5 cm mass in the right LOQ, 9 mm mass right UOQ, 6 mm mass right LIQ, enlarged lymph node Rt axilla   08/20/2015 Initial Diagnosis Right breast biopsy 1:00: IDC grade 2 ER 95%, PR 2%, Ki 67 15%; HER-2 negative ratio 1.04; 10:00: IDC grade 2-3 dissimilar to 1:00 biopsy 10:00: ER 95%, PR 0%, K67: 25%, HER-2 positive ratio 6.42 right axillary lymph node positive for metastatic cancer   08/20/2015 Procedure Left biopsy 8:00: IDC with DCIS, grade 2; ER 95%, PR 95%, Ki-67 30%, HER-2 negative ratio 1.2; 12:00: IDC with DCIS, grade 1 ER 95%, PR 95%, Ki-67 10% HER-2 negative ratio 1.14 not similar to this biopsy, left axillary lymph node positive,    09/13/2015 -  Neo-Adjuvant Chemotherapy Neoadjuvant dose dense Adriamycin and Cytoxan followed by Taxol Herceptin Perjeta    CHIEF COMPLIANT: Cycle 1 day 1 dose dense Adriamycin and Cytoxan  INTERVAL HISTORY: Felicia [name] is a 55 year old with above-mentioned history of left breast cancer currently On neoadjuvant chemotherapy and today cycle 1 day 8. She tolerated cycle 1 extremely well. She had very mild nausea. Did not have any vomiting. Did not have any fevers or chills.  REVIEW OF SYSTEMS:   Constitutional: Denies fevers, chills or abnormal weight loss Eyes: Denies blurriness of vision Ears, nose, mouth, throat, and face: Denies mucositis or sore throat Respiratory: Denies cough, dyspnea or wheezes Cardiovascular: Denies palpitation, chest discomfort Gastrointestinal:  Denies nausea, heartburn or change in bowel habits Skin: Denies abnormal skin rashes Lymphatics: Denies new lymphadenopathy or easy  bruising Neurological:Denies numbness, tingling or new weaknesses Behavioral/Psych: Mood is stable, no new changes  Extremities: No lower extremity edema All other systems were reviewed with the patient and are negative.  I have reviewed the past medical history, past surgical history, social history and family history with the patient and they are unchanged from previous note.  ALLERGIES:  has No Known Allergies.  MEDICATIONS:  Current Outpatient Prescriptions  Medication Sig Dispense Refill  . dexamethasone (DECADRON) 4 MG tablet Take 1 tablet (4 mg total) by mouth daily. 1 tab daily x 2 days start day after chemo 30 tablet 1  . HYDROcodone-acetaminophen (NORCO) 10-325 MG tablet Take 1 tablet by mouth every 6 (six) hours as needed. 10 tablet 0  . lidocaine-prilocaine (EMLA) cream Apply to affected area once 30 g 3  . LORazepam (ATIVAN) 0.5 MG tablet Take 1 tablet (0.5 mg total) by mouth every 6 (six) hours as needed (Nausea or vomiting). 30 tablet 0  . naproxen (NAPROSYN) 500 MG tablet Take 500 mg by mouth 2 (two) times daily with a meal.    . ondansetron (ZOFRAN) 8 MG tablet Take 1 tablet (8 mg total) by mouth 2 (two) times daily as needed. Start on the third day after chemotherapy. 30 tablet 1  . prochlorperazine (COMPAZINE) 10 MG tablet Take 1 tablet (10 mg total) by mouth every 6 (six) hours as needed (Nausea or vomiting). 30 tablet 1   No current facility-administered medications for this visit.    PHYSICAL EXAMINATION: ECOG PERFORMANCE STATUS: 1 - Symptomatic but completely ambulatory  Filed Vitals:   09/13/15 1015  BP: 123/75  Pulse: 86  Temp: 98.1 F (36.7 C)  Resp: 18   Filed Weights   09/13/15 1015  Weight: 235 lb 9.6 oz (106.867 kg)    GENERAL:alert, no distress and comfortable SKIN: skin color, texture, turgor are normal, no rashes or significant lesions EYES: normal, Conjunctiva are pink and non-injected, sclera clear OROPHARYNX:no exudate, no erythema and  lips, buccal mucosa, and tongue normal  NECK: supple, thyroid normal size, non-tender, without nodularity LYMPH:  no palpable lymphadenopathy in the cervical, axillary or inguinal LUNGS: clear to auscultation and percussion with normal breathing effort HEART: regular rate & rhythm and no murmurs and no lower extremity edema ABDOMEN:abdomen soft, non-tender and normal bowel sounds MUSCULOSKELETAL:no cyanosis of digits and no clubbing  NEURO: alert & oriented x 3 with fluent speech, no focal motor/sensory deficits EXTREMITIES: No lower extremity edema  LABORATORY DATA:  I have reviewed the data as listed   Chemistry      Component Value Date/Time   NA 140 09/06/2015 1054   K 3.4* 09/06/2015 1054   CO2 26 09/06/2015 1054   BUN 13.4 09/06/2015 1054   CREATININE 0.7 09/06/2015 1054      Component Value Date/Time   CALCIUM 9.4 09/06/2015 1054   ALKPHOS 74 09/06/2015 1054   AST 29 09/06/2015 1054   ALT 42 09/06/2015 1054   BILITOT 0.46 09/06/2015 1054       Lab Results  Component Value Date   WBC 4.0 09/13/2015   HGB 14.0 09/13/2015   HCT 40.9 09/13/2015   MCV 84.9 09/13/2015   PLT 127* 09/13/2015   NEUTROABS 2.5 09/13/2015   ASSESSMENT & PLAN:  Bilateral breast cancer ([facility]) 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%; HER-2 negative ratio 1.04; 10:00: IDC grade 2-3 dissimilar to 1:00 biopsy 10:00: ER 95%, PR 0%, K67: 25%, HER-2 positive ratio 6.42 right axillary lymph node positive for metastatic cancer (LN is Her 2 neg) --------------------------------------------------------------------------------- Left breast LIQ 08/20/2015: 4.2 cm, 4 mm oval mass 12:00, 1.2 cm mass left UOQ, enlarged left axillary lymph node, T2 N1 stage II B clinical stage  Left biopsy 8:00: IDC  with DCIS, grade 2; ER 95%, PR 95%, Ki-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 CT CAP and Bone scan 08/27/15:  Bil Breast masses, Bil Axill LN, Bil lower lobe lung nodules indeterminate 7 mm  ---------------------------------------------------------------------------------------------------------------------------------------------. Treatment plan: 1. Neoadjuvant chemotherapy with either AC followed by THP (followed by CT chest to evaluate the lung nodules and repeat breast MRI) 2. Followed by bilateral mastectomies and lymph node surgery 3. Followed by adjuvant radiation 4. Followed by adjuvant antiestrogen therapy ------------------------------------------------------------------------------------------------------------------------------------ Current treatment: Cycle 1 day 8 dose dense Adriamycin and Cytoxan Echocardiogram 08/24/2015: EF 60-65% Chemotherapy toxicities: 1. Mild nausea 2. Mild fatigue and headache Return to clinic in 1 week for cycle 2    No orders of the defined types were placed in this encounter.   The patient has a good understanding of the overall plan. she agrees with it. she will call with any problems that may develop before the next visit here.  Rulon Eisenmenger, MD 09/13/2015

## 2015-09-13 NOTE — Assessment & Plan Note (Signed)
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%; HER-2 negative ratio 1.04; 10:00: IDC grade 2-3 dissimilar to 1:00 biopsy 10:00: ER 95%, PR 0%, K67: 25%, HER-2 positive ratio 6.42 right axillary lymph node positive for metastatic cancer (LN is Her 2 neg) --------------------------------------------------------------------------------- Left breast LIQ 08/20/2015: 4.2 cm, 4 mm oval mass 12:00, 1.2 cm mass left UOQ, enlarged left axillary lymph node, T2 N1 stage II B clinical stage  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 CT CAP and Bone scan 08/27/15:  Bil Breast masses, Bil Axill LN, Bil lower lobe lung nodules indeterminate 7 mm  ---------------------------------------------------------------------------------------------------------------------------------------------. Treatment plan: 1. Neoadjuvant chemotherapy with either AC followed by THP (followed by CT chest to evaluate the lung nodules and repeat breast MRI) 2. Followed by bilateral mastectomies and lymph node surgery 3. Followed by adjuvant radiation 4. Followed by adjuvant antiestrogen therapy ------------------------------------------------------------------------------------------------------------------------------------ Current treatment: Cycle 1 day 1 dose dense Adriamycin and Cytoxan Antiemetics were reviewed Echocardiogram 08/24/2015: EF 60-65% Return to clinic in 1 week for toxicity check

## 2015-09-20 ENCOUNTER — Telehealth: Payer: Self-pay | Admitting: Hematology and Oncology

## 2015-09-20 ENCOUNTER — Ambulatory Visit (HOSPITAL_BASED_OUTPATIENT_CLINIC_OR_DEPARTMENT_OTHER): Payer: Self-pay | Admitting: Hematology and Oncology

## 2015-09-20 ENCOUNTER — Other Ambulatory Visit (HOSPITAL_BASED_OUTPATIENT_CLINIC_OR_DEPARTMENT_OTHER): Payer: Self-pay

## 2015-09-20 ENCOUNTER — Encounter: Payer: Self-pay | Admitting: Hematology and Oncology

## 2015-09-20 ENCOUNTER — Ambulatory Visit (HOSPITAL_BASED_OUTPATIENT_CLINIC_OR_DEPARTMENT_OTHER): Payer: Self-pay

## 2015-09-20 VITALS — BP 151/72 | HR 77 | Temp 98.1°F | Resp 18 | Wt 236.9 lb

## 2015-09-20 DIAGNOSIS — Z5111 Encounter for antineoplastic chemotherapy: Secondary | ICD-10-CM

## 2015-09-20 DIAGNOSIS — C50811 Malignant neoplasm of overlapping sites of right female breast: Secondary | ICD-10-CM

## 2015-09-20 DIAGNOSIS — C50312 Malignant neoplasm of lower-inner quadrant of left female breast: Secondary | ICD-10-CM

## 2015-09-20 DIAGNOSIS — C773 Secondary and unspecified malignant neoplasm of axilla and upper limb lymph nodes: Secondary | ICD-10-CM

## 2015-09-20 DIAGNOSIS — C50211 Malignant neoplasm of upper-inner quadrant of right female breast: Secondary | ICD-10-CM

## 2015-09-20 DIAGNOSIS — C50411 Malignant neoplasm of upper-outer quadrant of right female breast: Secondary | ICD-10-CM

## 2015-09-20 DIAGNOSIS — C50812 Malignant neoplasm of overlapping sites of left female breast: Secondary | ICD-10-CM

## 2015-09-20 LAB — CBC WITH DIFFERENTIAL/PLATELET
BASO%: 0.2 % (ref 0.0–2.0)
Basophils Absolute: 0 10*3/uL (ref 0.0–0.1)
EOS%: 0.2 % (ref 0.0–7.0)
Eosinophils Absolute: 0 10*3/uL (ref 0.0–0.5)
HEMATOCRIT: 41.4 % (ref 34.8–46.6)
HGB: 14.1 g/dL (ref 11.6–15.9)
LYMPH#: 1.6 10*3/uL (ref 0.9–3.3)
LYMPH%: 17.9 % (ref 14.0–49.7)
MCH: 29.1 pg (ref 25.1–34.0)
MCHC: 34.1 g/dL (ref 31.5–36.0)
MCV: 85.5 fL (ref 79.5–101.0)
MONO#: 0.5 10*3/uL (ref 0.1–0.9)
MONO%: 6.2 % (ref 0.0–14.0)
NEUT%: 75.5 % (ref 38.4–76.8)
NEUTROS ABS: 6.6 10*3/uL — AB (ref 1.5–6.5)
PLATELETS: 142 10*3/uL — AB (ref 145–400)
RBC: 4.84 10*6/uL (ref 3.70–5.45)
RDW: 13 % (ref 11.2–14.5)
WBC: 8.8 10*3/uL (ref 3.9–10.3)

## 2015-09-20 LAB — COMPREHENSIVE METABOLIC PANEL
ALT: 33 U/L (ref 0–55)
ANION GAP: 8 meq/L (ref 3–11)
AST: 25 U/L (ref 5–34)
Albumin: 3.5 g/dL (ref 3.5–5.0)
Alkaline Phosphatase: 96 U/L (ref 40–150)
BILIRUBIN TOTAL: 0.31 mg/dL (ref 0.20–1.20)
BUN: 8.9 mg/dL (ref 7.0–26.0)
CALCIUM: 9.2 mg/dL (ref 8.4–10.4)
CO2: 22 mEq/L (ref 22–29)
Chloride: 109 mEq/L (ref 98–109)
Creatinine: 0.7 mg/dL (ref 0.6–1.1)
EGFR: >90 mL/min/{1.73_m2} (ref >90)
Glucose: 211 mg/dl — ABNORMAL HIGH (ref 70–140)
Potassium: 3.2 mEq/L — ABNORMAL LOW (ref 3.5–5.1)
Sodium: 140 mEq/L (ref 136–145)
TOTAL PROTEIN: 7.2 g/dL (ref 6.4–8.3)

## 2015-09-20 MED ORDER — DOXORUBICIN HCL CHEMO IV INJECTION 2 MG/ML
60.0000 mg/m2 | Freq: Once | INTRAVENOUS | Status: AC
  Administered 2015-09-20: 128 mg via INTRAVENOUS
  Filled 2015-09-20: qty 64

## 2015-09-20 MED ORDER — PEGFILGRASTIM 6 MG/0.6ML ~~LOC~~ PSKT
6.0000 mg | PREFILLED_SYRINGE | Freq: Once | SUBCUTANEOUS | Status: AC
  Administered 2015-09-20: 6 mg via SUBCUTANEOUS
  Filled 2015-09-20: qty 0.6

## 2015-09-20 MED ORDER — SODIUM CHLORIDE 0.9 % IV SOLN
Freq: Once | INTRAVENOUS | Status: AC
  Administered 2015-09-20: 11:00:00 via INTRAVENOUS
  Filled 2015-09-20: qty 5

## 2015-09-20 MED ORDER — PALONOSETRON HCL INJECTION 0.25 MG/5ML
0.2500 mg | Freq: Once | INTRAVENOUS | Status: AC
  Administered 2015-09-20: 0.25 mg via INTRAVENOUS

## 2015-09-20 MED ORDER — SODIUM CHLORIDE 0.9% FLUSH
10.0000 mL | INTRAVENOUS | Status: DC | PRN
  Administered 2015-09-20: 10 mL
  Filled 2015-09-20: qty 10

## 2015-09-20 MED ORDER — MAGIC MOUTHWASH W/LIDOCAINE: 5.0000 mL | Freq: Three times a day (TID) | ORAL | Status: DC | PRN

## 2015-09-20 MED ORDER — PALONOSETRON HCL INJECTION 0.25 MG/5ML
INTRAVENOUS | Status: AC
  Filled 2015-09-20: qty 5

## 2015-09-20 MED ORDER — HEPARIN SOD (PORK) LOCK FLUSH 100 UNIT/ML IV SOLN
500.0000 [IU] | Freq: Once | INTRAVENOUS | Status: AC | PRN
  Administered 2015-09-20: 500 [IU]
  Filled 2015-09-20: qty 5

## 2015-09-20 MED ORDER — SODIUM CHLORIDE 0.9 % IV SOLN
Freq: Once | INTRAVENOUS | Status: AC
  Administered 2015-09-20: 11:00:00 via INTRAVENOUS

## 2015-09-20 MED ORDER — SODIUM CHLORIDE 0.9 % IV SOLN
600.0000 mg/m2 | Freq: Once | INTRAVENOUS | Status: AC
  Administered 2015-09-20: 1280 mg via INTRAVENOUS
  Filled 2015-09-20: qty 64

## 2015-09-20 NOTE — Telephone Encounter (Signed)
appt made and avs printed °

## 2015-09-20 NOTE — Progress Notes (Signed)
Patient Care Team: No Pcp Per Patient as PCP - General (General Practice)  SUMMARY OF ONCOLOGIC HISTORY:   Bilateral breast cancer ([facility])   08/16/2015 Mammogram Left breast LIQ 4.2 cm, 4 mm oval mass 12:00, 1.2 cm mass left UOQ, enlarged left axillary lymph node   08/16/2015 Mammogram Right breast UIQ: 3.9 cm irregular mass, 1.5 cm mass in the right LOQ, 9 mm mass right UOQ, 6 mm mass right LIQ, enlarged lymph node Rt axilla   08/20/2015 Initial Diagnosis Right breast biopsy 1:00: IDC grade 2 ER 95%, PR 2%, Ki 67 15%; HER-2 negative ratio 1.04; 10:00: IDC grade 2-3 dissimilar to 1:00 biopsy 10:00: ER 95%, PR 0%, K67: 25%, HER-2 positive ratio 6.42 right axillary lymph node positive for metastatic cancer   08/20/2015 Procedure Left biopsy 8:00: IDC with DCIS, grade 2; ER 95%, PR 95%, Ki-67 30%, HER-2 negative ratio 1.2; 12:00: IDC with DCIS, grade 1 ER 95%, PR 95%, Ki-67 10% HER-2 negative ratio 1.14 not similar to this biopsy, left axillary lymph node positive,    09/13/2015 -  Neo-Adjuvant Chemotherapy Neoadjuvant dose dense Adriamycin and Cytoxan followed by Taxol Herceptin Perjeta    CHIEF COMPLIANT: Cycle 2 dose dense Adriamycin and Cytoxan  INTERVAL HISTORY: Felicia [name] is a 55 year old with above-mentioned history of bilateral breast cancers currently on neoadjuvant chemotherapy and today cycle 2 of dose dense Adriamycin and Cytoxan. She has tolerated cycle one reasonably well. She had mouth sores which healed after 2 days. Because of this she was not able to eat much food. Denied any fevers or chills denied any nausea or vomiting.  REVIEW OF SYSTEMS:   Constitutional: Denies fevers, chills or abnormal weight loss Eyes: Denies blurriness of vision Ears, nose, mouth, throat, and face: Mild sores have resolved Respiratory: Denies cough, dyspnea or wheezes Cardiovascular: Denies palpitation, chest discomfort Gastrointestinal:  Denies nausea, heartburn or change in bowel habits Skin:  Denies abnormal skin rashes Lymphatics: Denies new lymphadenopathy or easy bruising Neurological:Denies numbness, tingling or new weaknesses Behavioral/Psych: Mood is stable, no new changes  Extremities: No lower extremity edema Breast:  denies any pain or lumps or nodules in either breasts All other systems were reviewed with the patient and are negative.  I have reviewed the past medical history, past surgical history, social history and family history with the patient and they are unchanged from previous note.  ALLERGIES:  has No Known Allergies.  MEDICATIONS:  Current Outpatient Prescriptions  Medication Sig Dispense Refill  . dexamethasone (DECADRON) 4 MG tablet Take 1 tablet (4 mg total) by mouth daily. 1 tab daily x 2 days start day after chemo 30 tablet 1  . HYDROcodone-acetaminophen (NORCO) 10-325 MG tablet Take 1 tablet by mouth every 6 (six) hours as needed. 10 tablet 0  . lidocaine-prilocaine (EMLA) cream Apply to affected area once 30 g 3  . LORazepam (ATIVAN) 0.5 MG tablet Take 1 tablet (0.5 mg total) by mouth every 6 (six) hours as needed (Nausea or vomiting). 30 tablet 0  . naproxen (NAPROSYN) 500 MG tablet Take 500 mg by mouth 2 (two) times daily with a meal.    . ondansetron (ZOFRAN) 8 MG tablet Take 1 tablet (8 mg total) by mouth 2 (two) times daily as needed. Start on the third day after chemotherapy. 30 tablet 1  . prochlorperazine (COMPAZINE) 10 MG tablet Take 1 tablet (10 mg total) by mouth every 6 (six) hours as needed (Nausea or vomiting). 30 tablet 1   No current facility-administered  medications for this visit.    PHYSICAL EXAMINATION: ECOG PERFORMANCE STATUS: 1 - Symptomatic but completely ambulatory  Filed Vitals:   09/20/15 0907  BP: 151/72  Pulse: 77  Temp: 98.1 F (36.7 C)  Resp: 18   Filed Weights   09/20/15 0907  Weight: 236 lb 14.4 oz (107.457 kg)    GENERAL:alert, no distress and comfortable SKIN: skin color, texture, turgor are normal,  no rashes or significant lesions EYES: normal, Conjunctiva are pink and non-injected, sclera clear OROPHARYNX:no exudate, no erythema and lips, buccal mucosa, and tongue normal  NECK: supple, thyroid normal size, non-tender, without nodularity LYMPH:  no palpable lymphadenopathy in the cervical, axillary or inguinal LUNGS: clear to auscultation and percussion with normal breathing effort HEART: regular rate & rhythm and no murmurs and no lower extremity edema ABDOMEN:abdomen soft, non-tender and normal bowel sounds MUSCULOSKELETAL:no cyanosis of digits and no clubbing  NEURO: alert & oriented x 3 with fluent speech, no focal motor/sensory deficits EXTREMITIES: No lower extremity edema   LABORATORY DATA:  I have reviewed the data as listed   Chemistry      Component Value Date/Time   NA 136 09/13/2015 0953   K 3.7 09/13/2015 0953   CO2 23 09/13/2015 0953   BUN 9.5 09/13/2015 0953   CREATININE 0.6 09/13/2015 0953      Component Value Date/Time   CALCIUM 9.3 09/13/2015 0953   ALKPHOS 95 09/13/2015 0953   AST 14 09/13/2015 0953   ALT 24 09/13/2015 0953   BILITOT 0.71 09/13/2015 0953       Lab Results  Component Value Date   WBC 8.8 09/20/2015   HGB 14.1 09/20/2015   HCT 41.4 09/20/2015   MCV 85.5 09/20/2015   PLT 142* 09/20/2015   NEUTROABS 6.6* 09/20/2015     ASSESSMENT & PLAN:  Bilateral breast cancer ([facility]) 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%; HER-2 negative ratio 1.04; 10:00: IDC grade 2-3 dissimilar to 1:00 biopsy 10:00: ER 95%, PR 0%, K67: 25%, HER-2 positive ratio 6.42 right axillary lymph node positive for metastatic cancer (LN is Her 2 neg) --------------------------------------------------------------------------------- Left breast LIQ 08/20/2015:  4.2 cm, 4 mm oval mass 12:00, 1.2 cm mass left UOQ, enlarged left axillary lymph node, T2 N1 stage II B clinical stage  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 CT CAP and Bone scan 08/27/15: Bil Breast masses, Bil Axill LN, Bil lower lobe lung nodules indeterminate 7 mm  ---------------------------------------------------------------------------------------------------------------------------------------------. Treatment plan: 1. Neoadjuvant chemotherapy with either AC followed by THP (followed by CT chest to evaluate the lung nodules and repeat breast MRI) 2. Followed by bilateral mastectomies and lymph node surgery 3. Followed by adjuvant radiation 4. Followed by adjuvant antiestrogen therapy ------------------------------------------------------------------------------------------------------------------------------------ Current treatment: Cycle 2 day 1 dose dense Adriamycin and Cytoxan Echocardiogram 08/24/2015: EF 60-65% Chemotherapy toxicities: 1. Mild nausea 2. Mild fatigue and headache 3. Mouth sores: Given a prescription for Magic mouthwash Return to clinic in 2 weeks for cycle 3   No orders of the defined types were  placed in this encounter.   The patient has a good understanding of the overall plan. she agrees with it. she will call with any problems that may develop before the next visit here.   Rulon Eisenmenger, MD 09/20/2015

## 2015-09-20 NOTE — Progress Notes (Signed)
Met with patient's daughter and interpreter regarding needing financial assistance for medication. Proof of income was presented with only spouse working. Approved patient for $1000 [employer]. Patient signed, copy of approval as well as expenses it covers were given to daughter as well as contact information for our Outpatient pharmacy. They have my card for any additional financial questions or concerns.

## 2015-09-20 NOTE — Assessment & Plan Note (Signed)
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%; HER-2 negative ratio 1.04; 10:00: IDC grade 2-3 dissimilar to 1:00 biopsy 10:00: ER 95%, PR 0%, K67: 25%, HER-2 positive ratio 6.42 right axillary lymph node positive for metastatic cancer (LN is Her 2 neg) --------------------------------------------------------------------------------- Left breast LIQ 08/20/2015: 4.2 cm, 4 mm oval mass 12:00, 1.2 cm mass left UOQ, enlarged left axillary lymph node, T2 N1 stage II B clinical stage  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 CT CAP and Bone scan 08/27/15: Bil Breast masses, Bil Axill LN, Bil lower lobe lung nodules indeterminate 7 mm  ---------------------------------------------------------------------------------------------------------------------------------------------. Treatment plan: 1. Neoadjuvant chemotherapy with either AC followed by THP (followed by CT chest to evaluate the lung nodules and repeat breast MRI) 2. Followed by bilateral mastectomies and lymph node surgery 3. Followed by adjuvant radiation 4. Followed by adjuvant antiestrogen therapy ------------------------------------------------------------------------------------------------------------------------------------ Current treatment: Cycle 2 day 1 dose dense Adriamycin and Cytoxan Echocardiogram 08/24/2015: EF 60-65% Chemotherapy toxicities: 1. Mild nausea 2. Mild fatigue and headache Return to clinic in 2 weeks for cycle 3

## 2015-10-04 ENCOUNTER — Ambulatory Visit (HOSPITAL_BASED_OUTPATIENT_CLINIC_OR_DEPARTMENT_OTHER): Payer: Self-pay

## 2015-10-04 ENCOUNTER — Encounter: Payer: Self-pay | Admitting: Hematology and Oncology

## 2015-10-04 ENCOUNTER — Ambulatory Visit (HOSPITAL_BASED_OUTPATIENT_CLINIC_OR_DEPARTMENT_OTHER): Payer: Self-pay | Admitting: Hematology and Oncology

## 2015-10-04 ENCOUNTER — Other Ambulatory Visit (HOSPITAL_BASED_OUTPATIENT_CLINIC_OR_DEPARTMENT_OTHER): Payer: Self-pay

## 2015-10-04 ENCOUNTER — Telehealth: Payer: Self-pay | Admitting: Hematology and Oncology

## 2015-10-04 ENCOUNTER — Encounter: Payer: Self-pay | Admitting: *Deleted

## 2015-10-04 VITALS — BP 142/82 | HR 82 | Temp 98.3°F | Resp 18 | Ht 59.0 in | Wt 235.2 lb

## 2015-10-04 DIAGNOSIS — C773 Secondary and unspecified malignant neoplasm of axilla and upper limb lymph nodes: Secondary | ICD-10-CM

## 2015-10-04 DIAGNOSIS — Z5111 Encounter for antineoplastic chemotherapy: Secondary | ICD-10-CM

## 2015-10-04 DIAGNOSIS — C50811 Malignant neoplasm of overlapping sites of right female breast: Secondary | ICD-10-CM

## 2015-10-04 DIAGNOSIS — C50312 Malignant neoplasm of lower-inner quadrant of left female breast: Secondary | ICD-10-CM

## 2015-10-04 DIAGNOSIS — C50211 Malignant neoplasm of upper-inner quadrant of right female breast: Secondary | ICD-10-CM

## 2015-10-04 DIAGNOSIS — C50411 Malignant neoplasm of upper-outer quadrant of right female breast: Secondary | ICD-10-CM

## 2015-10-04 DIAGNOSIS — C50812 Malignant neoplasm of overlapping sites of left female breast: Secondary | ICD-10-CM

## 2015-10-04 LAB — CBC WITH DIFFERENTIAL/PLATELET
BASO%: 0.9 % (ref 0.0–2.0)
BASOS ABS: 0.1 10*3/uL (ref 0.0–0.1)
EOS ABS: 0 10*3/uL (ref 0.0–0.5)
EOS%: 0.2 % (ref 0.0–7.0)
HCT: 42.1 % (ref 34.8–46.6)
HEMOGLOBIN: 13.9 g/dL (ref 11.6–15.9)
LYMPH%: 15.3 % (ref 14.0–49.7)
MCH: 28.3 pg (ref 25.1–34.0)
MCHC: 33.1 g/dL (ref 31.5–36.0)
MCV: 85.5 fL (ref 79.5–101.0)
MONO#: 0.9 10*3/uL (ref 0.1–0.9)
MONO%: 8.2 % (ref 0.0–14.0)
NEUT#: 7.8 10*3/uL — ABNORMAL HIGH (ref 1.5–6.5)
NEUT%: 75.4 % (ref 38.4–76.8)
Platelets: 151 10*3/uL (ref 145–400)
RBC: 4.93 10*6/uL (ref 3.70–5.45)
RDW: 13.3 % (ref 11.2–14.5)
WBC: 10.3 10*3/uL (ref 3.9–10.3)
lymph#: 1.6 10*3/uL (ref 0.9–3.3)

## 2015-10-04 LAB — COMPREHENSIVE METABOLIC PANEL
ALBUMIN: 3.9 g/dL (ref 3.5–5.0)
ALK PHOS: 92 U/L (ref 40–150)
ALT: 34 U/L (ref 0–55)
AST: 26 U/L (ref 5–34)
Anion Gap: 10 mEq/L (ref 3–11)
BUN: 9 mg/dL (ref 7.0–26.0)
CHLORIDE: 106 meq/L (ref 98–109)
CO2: 23 meq/L (ref 22–29)
Calcium: 9.3 mg/dL (ref 8.4–10.4)
Creatinine: 0.7 mg/dL (ref 0.6–1.1)
GLUCOSE: 118 mg/dL (ref 70–140)
POTASSIUM: 3.8 meq/L (ref 3.5–5.1)
SODIUM: 139 meq/L (ref 136–145)
Total Bilirubin: 0.4 mg/dL (ref 0.20–1.20)
Total Protein: 7.6 g/dL (ref 6.4–8.3)

## 2015-10-04 MED ORDER — PALONOSETRON HCL INJECTION 0.25 MG/5ML
INTRAVENOUS | Status: AC
  Filled 2015-10-04: qty 5

## 2015-10-04 MED ORDER — FOSAPREPITANT DIMEGLUMINE INJECTION 150 MG
Freq: Once | INTRAVENOUS | Status: AC
  Administered 2015-10-04: 12:00:00 via INTRAVENOUS
  Filled 2015-10-04: qty 5

## 2015-10-04 MED ORDER — HEPARIN SOD (PORK) LOCK FLUSH 100 UNIT/ML IV SOLN
500.0000 [IU] | Freq: Once | INTRAVENOUS | Status: AC | PRN
  Administered 2015-10-04: 500 [IU]
  Filled 2015-10-04: qty 5

## 2015-10-04 MED ORDER — PALONOSETRON HCL INJECTION 0.25 MG/5ML
0.2500 mg | Freq: Once | INTRAVENOUS | Status: AC
  Administered 2015-10-04: 0.25 mg via INTRAVENOUS

## 2015-10-04 MED ORDER — SODIUM CHLORIDE 0.9% FLUSH
10.0000 mL | INTRAVENOUS | Status: DC | PRN
  Administered 2015-10-04: 10 mL
  Filled 2015-10-04: qty 10

## 2015-10-04 MED ORDER — PEGFILGRASTIM 6 MG/0.6ML ~~LOC~~ PSKT
6.0000 mg | PREFILLED_SYRINGE | Freq: Once | SUBCUTANEOUS | Status: AC
  Administered 2015-10-04: 6 mg via SUBCUTANEOUS
  Filled 2015-10-04: qty 0.6

## 2015-10-04 MED ORDER — SODIUM CHLORIDE 0.9 % IV SOLN
600.0000 mg/m2 | Freq: Once | INTRAVENOUS | Status: AC
  Administered 2015-10-04: 1280 mg via INTRAVENOUS
  Filled 2015-10-04: qty 64

## 2015-10-04 MED ORDER — DOXORUBICIN HCL CHEMO IV INJECTION 2 MG/ML
60.0000 mg/m2 | Freq: Once | INTRAVENOUS | Status: AC
  Administered 2015-10-04: 128 mg via INTRAVENOUS
  Filled 2015-10-04: qty 64

## 2015-10-04 MED ORDER — SODIUM CHLORIDE 0.9 % IV SOLN
Freq: Once | INTRAVENOUS | Status: AC
  Administered 2015-10-04: 12:00:00 via INTRAVENOUS

## 2015-10-04 NOTE — Patient Instructions (Addendum)
[facility] Discharge Instructions for Patients Receiving Chemotherapy  Today you received the following chemotherapy agents: Adriamycin and Cytoxan.  To help prevent nausea and vomiting after your treatment, we encourage you to take your nausea medication as prescribed. If you develop nausea and vomiting that is not controlled by your nausea medication, call the clinic.   BELOW ARE SYMPTOMS THAT SHOULD BE REPORTED IMMEDIATELY:  *FEVER GREATER THAN 100.5 F  *CHILLS WITH OR WITHOUT FEVER  NAUSEA AND VOMITING THAT IS NOT CONTROLLED WITH YOUR NAUSEA MEDICATION  *UNUSUAL SHORTNESS OF BREATH  *UNUSUAL BRUISING OR BLEEDING  TENDERNESS IN MOUTH AND THROAT WITH OR WITHOUT PRESENCE OF ULCERS  *URINARY PROBLEMS  *BOWEL PROBLEMS  UNUSUAL RASH Items with * indicate a potential emergency and should be followed up as soon as possible.  Feel free to call the clinic you have any questions or concerns. The clinic phone number is [phone].  Please show the [place] at check-in to the Emergency Department and triage nurse.  Doxorubicin injection Qu es este medicamento? La DOXORRUBICINA es un agente quimioteraputico. Se utiliza para el tratamiento de muchos tipos de cnceres, como enfermedad de Hodgkin, leucemia, linfoma no Hodgkin, neuroblastoma, sarcoma y el tumor de [place]. Se utiliza tambin para el tratamiento del cncer de vejiga, mama, pulmn, ovarios, estmago, y tiroides. Este medicamento puede ser utilizado para otros usos; si tiene alguna pregunta consulte con su proveedor de atencin mdica o con su farmacutico. Qu le debo informar a mi profesional de la salud antes de tomar este medicamento? Necesita saber si usted presenta alguno de los siguientes problemas o situaciones: -trastornos sanguneos -enfermedad cardiaca, ataque cardiaco reciente -infeccin (especialmente infecciones virales, como varicela o herpes) -latidos cardacos  irregulares -enfermedad heptica -radioterapia reciente o continuada -una reaccin alrgica o inusual a la doxorrubicina, a otros agentes quimioteraputicos, a otros medicamentos, alimentos, colorantes o conservantes -si est embarazada o buscando quedar embarazada -si est amamantando a un beb Cmo debo utilizar este medicamento? Este medicamento se administra mediante infusin por va intravenosa. Lo administra un profesional de la salud calificado en un hospital o en un entorno clnico. Si experimenta dolor, hinchazn, ardor o cualquier sensacin inusual alrededor del lugar de la inyeccin, informe inmediatamente a su profesional de [employer]	[place]. Hable con su pediatra para informarse acerca del uso de este medicamento en nios. Puede requerir atencin especial. Sobredosis: Pngase en contacto inmediatamente con un centro toxicolgico o una sala de urgencia si usted cree que haya tomado demasiado medicamento. ATENCIN: [employer] es solo para usted. No comparta este medicamento con nadie. Qu sucede si me olvido de una dosis? Es importante no olvidar ninguna dosis. Informe a su mdico o a su profesional de la salud si no puede asistir a Photographer. Qu puede interactuar con este medicamento? No tome esta medicina con ninguno de los siguientes medicamentos: -cisapride -droperidol -halofantrina -pimozida -zidovudina Esta medicina tambin puede interactuar con los siguientes medicamentos: -cloroquina -clorpromacina -claritromicina -ciclofosfamida -ciclosporina -eritromicina -medicamentos para la depresin, ansiedad o trastornos psicticos -medicamentos utilizados para el pulso cardiaco irregular, tales como amiodarona, bepridil, dofetilida, encainida, flecainida, propafenona, quinidina -medicamentos para convulsiones, tales como etotona, fosfenitona, fenitona -medicamentos para las nuseas, vmito, tales como dolasetrn, [place], Contractor -medicamentos para incrementar  los conteos sanguneos, tales como filgrastim, pegfilgrastim, sargramostim -metadona -metotrexato -pentamidina -progesterona -vacunas -verapamilo Consulte a su mdico o a su profesional de la salud antes de tomar cualquiera de los siguientes medicamentos: -acetaminofeno -aspirina -ibuprofeno -quetoprofeno -naproxeno Puede ser que [employer] no  menciona todas las posibles interacciones. Informe a su profesional de [employer]	[place] de [employer] productos a base de hierbas, medicamentos de [place] o suplementos nutritivos que est tomando. Si usted fuma, consume bebidas alcohlicas o si utiliza drogas ilegales, indqueselo tambin a su profesional de [employer]	[place]. Algunas sustancias pueden interactuar con su medicamento. A qu debo estar atento al usar [employer]? Se supervisar su condicin atentamente mientras reciba este medicamento. Tendr que hacerse anlisis de sangre peridicos mientras recibe [place]. Este medicamento puede hacerle sentir un Nurse, mental health. Esto es normal ya que la quimioterapia afecta tanto a las clulas sanas como a las clulas cancerosas. Si presenta alguno de los [employer], infrmelos. Sin embargo, contine con el tratamiento aun si se siente enfermo, a menos que su mdico le indique que lo suspenda. Despus de varios das de [place], [place] orina puede ser de color rojo. Esto no significa sangre en la orina. Si su orina es un color oscuro o Forensic psychologist, comunquese con su mdico. En algunos casos, podr recibir [employer] para ayudarle con los efectos secundarios. [place] para usar. Consulte a su mdico o a su profesional de la salud por asesoramiento si tiene fiebre, escalofros, dolor de garganta o cualquier otro sntoma de resfro o gripe. No se trate usted mismo. Este medicamento puede reducir la capacidad del cuerpo para combatir infecciones. Trate de no acercarse a personas que estn enfermas. [employer] puede  aumentar el riesgo de magulladuras o sangrado. Consulte a su mdico o a su profesional de la salud si observa sangrados inusuales. Proceda con cuidado al cepillar sus dientes, usar hilo dental o Risk manager palillos para los dientes, ya que puede contraer una infeccin o Therapist, art con mayor facilidad. Si se somete a algn tratamiento dental, informe a su dentista que est [employer]. Evite tomar productos que contienen aspirina, acetaminofeno, ibuprofeno, naproxeno o quetoprofeno a menos que as lo indique su mdico. Estos productos pueden disimular la fiebre. Los hombres y las mujeres en edad de procrear deben Risk manager mtodos anticonceptivos eficaces mientras reciben este medicamento. No se debe quedar embarazada mientras reciben [employer]. Existe la posibilidad de efectos secundarios graves a un beb sin nacer. Para ms informacin hable con su profesional de la salud o su farmacutico. No debe amamantar a un beb mientras est tomando este medicamento. Evite que otras personas tocan su orina u otros fluidos corporales por lo menos 5 das despus de cada tratamiento. Quienes cuidan de los pacientes deben Risk manager guantes de ltex para English as a second language teacher contacto con la [place] y otros fluidos corporales durante este tiempo. Existe una cantidad mxima de este medicamento que debe recibir a lo largo de su vida. La cantidad depende de la afeccin mdica siendo tratado y su salud en general. Su mdico observar la cantidad de este medicamento que usted recibe en su vida. Informe a su mdico si usted ha tomado este medicamento antes. Qu efectos secundarios puedo tener al [employer] este medicamento? Efectos secundarios que debe informar a su mdico o a Barrister's clerk de la salud tan pronto como sea posible: -reacciones alrgicas como erupcin cutnea, picazn o urticarias, hinchazn de la cara, labios o lengua -conteos sanguneos bajos - este medicamento puede reducir la cantidad de glbulos blancos,  glbulos rojos y plaquetas. Su riesgo de infeccin y [place]. -signos de infeccin - fiebre o escalofros, tos, dolor de garganta, dolor o dificultad para orinar -signos de reduccin de plaquetas o sangrado - magulladuras, puntos rojos en la  piel, heces de color oscuro o con aspecto alquitranado, sangre en la orina -signos de reduccin de glbulos rojos - cansancio o debilidad inusual, desmayos, sensacin de mareo -problemas respiratorios -dolor en el pecho -pulso cardiaco rpido, irregular -llagas en la boca -nuseas, vmito -dolor, enrojecimiento, hinchazn en el lugar de la inyeccin -dolor, hormigueo, entumecimiento de manos o pies -hinchazn de tobillos, pies o manos -sangrado, magulladuras inusuales Efectos secundarios que, por lo general, no requieren Geophysical data processor (debe informarlos a su mdico o a su profesional de la salud si persisten o si son molestos): -diarrea -enrojecimiento de la cara -cada del cabello -prdida del apetito -ausencia de perodos menstruales -dao o Glass blower/designer de las uas -ojos rojos o llorosos -color rojo de la orina -Higher education careers adviser Puede ser que esta lista no menciona todos los posibles efectos secundarios. Comunquese a su mdico por asesoramiento mdico [employer] Usted puede informar los efectos secundarios a la FDA por telfono al 1-800-FDA-1088. Dnde debo guardar mi medicina? Este medicamento se administra en hospitales o clnicas y no necesitar guardarlo en su domicilio. ATENCIN: Este folleto es un resumen. Puede ser que no cubra toda la posible informacin. Si usted tiene preguntas acerca de esta medicina, consulte con su mdico, su farmacutico o su profesional de Technical sales engineer.    2016, Elsevier/Gold Standard. (2014-06-06 00:00:00)  Cyclophosphamide injection Qu es este medicamento? La CICLOFOSFAMIDA es un agente quimioteraputico. Este medicamento reduce el crecimiento de las clulas cancerosas.  Este medicamento se [place] en el tratamiento de varios tipos de cncer como linfoma, mieloma, leucemia, cncer de mama y cncer de ovarios, por ejemplo. Este medicamento puede ser utilizado para otros usos; si tiene alguna pregunta consulte con su proveedor de atencin mdica o con su farmacutico. Qu le debo informar a mi profesional de la salud antes de tomar este medicamento? Necesita saber si usted presenta alguno de los siguientes problemas o situaciones: -trastorno sanguneo -antecedentes de quimioterapia -infeccin -enfermedad renal -enfermedad heptica -radioterapia reciente o en curso -tumors en la mdula sea -una reaccin alrgica o inusual a la ciclofosfamida, a otros agentes quimioteraputicos, a otros medicamentos, alimentos, colorantes o conservantes -si est embarazada o buscando quedar embarazada -si est amamantando a un beb Cmo debo utilizar este medicamento? Este medicamento normalmente se administra mediante inyeccin por va intravenosa o intramuscular o infusin por va intravenosa. Lo administra un profesional de la salud calificado en un hospital o en un entorno clnico. Hable con su pediatra para informarse acerca del uso de este medicamento en nios. Puede requerir atencin especial. Sobredosis: Pngase en contacto inmediatamente con un centro toxicolgico o una sala de urgencia si usted cree que haya tomado demasiado medicamento. ATENCIN: [employer] es solo para usted. No comparta este medicamento con nadie. Qu sucede si me olvido de una dosis? Es importante no olvidar ninguna dosis. Informe a su mdico o a su profesional de la salud si no puede asistir a Photographer. Qu puede interactuar con este medicamento? Esta medicina puede interactuar con los siguientes medicamentos: -amiodarona -anfotericina B -azatioprina -ciertos medicamentos antivricos para el VIH o SIDA, tales como inhibidores de la proteasa (indinavir, ritonavir) y zidovudina -ciertos  medicamentos para la presin sangunea, tales como benazepril, captopril, enalapril, fosinopril, lisinopril, moexipril, monopril, perindopril, quinapril, ramipril, trandolapril -ciertos medicamentos para el cncer tales como antraciclnicos (daunorrubicina, doxorrubicina), busulfn, citarabina, paclitaxel, pentostatina, tamoxifeno, trastuzumab -ciertos diurticos, tales como clorotiazida, clortalidona, hidroclorotiazida, indapamida, metolazona -ciertos medicamentos que tratan o previenen cogulos sanguneos, como warfarina -ciertos relajantes musculares, tales como succinilcolina -ciclosporina -etanercept -  indometacina -medicamentos para incrementar los conteos sanguneos, tales como filgrastim, pegfilgrastim, sargramostim -medicamentos utilizados para la anestesia general -metronidazol -natalizumab Puede ser que esta lista no menciona todas las posibles interacciones. Informe a su profesional de [employer]	[place] de [employer] productos a base de hierbas, medicamentos de [place] o suplementos nutritivos que est tomando. Si usted fuma, consume bebidas alcohlicas o si utiliza drogas ilegales, indqueselo tambin a su profesional de [employer]	[place]. Algunas sustancias pueden interactuar con su medicamento. A qu debo estar atento al usar Coca[employer]? Visite a su mdico para chequear su evolucin. Este medicamento puede hacerle sentir un Nurse, mental health. Esto no es raro ya que la quimioterapia afecta tanto a las clulas sanas como a las clulas cancerosas. Si presenta alguno de los [employer], infrmelos. Sin embargo, contine con el tratamiento aun si se siente enfermo, a menos que su mdico le indique que lo suspenda. Beba agua u otros lquidos como se le haya indicado. Orine con frecuencia, especialmente de noche. En algunos casos, podr recibir [employer] para ayudarle con los efectos secundarios. Siga las instrucciones de [place]. Consulte a su mdico o a su profesional de la salud  por asesoramiento si tiene fiebre, escalofros, dolor de garganta o cualquier otro sntoma de resfro o gripe. No se trate usted mismo. Este medicamento puede reducir la capacidad del cuerpo para combatir infecciones. Trate de no acercarse a personas que estn enfermas. [employer] puede aumentar el riesgo de magulladuras o sangrado. Consulte a su mdico o a su profesional de la salud si nota sangrados inusuales. Proceda con cuidado al cepillar sus dientes, usar hilo dental o Risk manager palillos para los dientes, ya que puede contraer una infeccin o Therapist, art con mayor facilidad. Si se somete a algn tratamiento dental, informe a su dentista que est recibiendo [employer]. Puede experimentar mareos o somnolencia. No conduzca ni utilice maquinaria ni haga nada que Associate Professor en estado de alerta hasta que sepa cmo le afecta este medicamento. No se debe quedar embarazada mientras recibe este medicamento o durante 1 ao despus de terminarlo. Las mujeres deben informar a su mdico si estn buscando quedar embarazadas o si creen que estn embarazadas. Los hombres no deben tener hijos mientras estn recibiendo [employer] y durante 4 meses despus de terminarlo. Existe la posibilidad de efectos secundarios graves a un beb sin nacer. Para ms informacin hable con su profesional de la salud o su farmacutico. No debe Economist a un beb mientras est usando este medicamento. Este medicamento puede interferir con la capacidad de tener hijos. En algunas mujeres, este medicamento ha causado insuficiencia ovrica. En algunos hombres, este medicamento ha causado reduccin de los conteos de [place]. Usted debe consultarse con su mdico o su profesional de la salud si est preocupada por su fertilidad. Si va a someterse a una operacin, informe a su mdico o a su profesional de la salud que ha [employer]. Qu efectos secundarios puedo tener al [employer] este medicamento? Efectos  secundarios que debe informar a su mdico o a Barrister's clerk de la salud tan pronto como sea posible: -reacciones alrgicas como erupcin cutnea, picazn o urticarias, hinchazn de la cara, labios o lengua -conteos sanguneos bajos - este medicamento puede reducir la cantidad de glbulos blancos, glbulos rojos y plaquetas. Su riesgo de infeccin y [place]. -signos de infeccin - fiebre o escalofros, tos, dolor de garganta, dolor o dificultad para orinar -signos de reduccin de plaquetas o sangrado - magulladuras, puntos rojos en  la piel, heces de color oscuro o con aspecto alquitranado, sangre en la orina -signos de reduccin de glbulos rojos - cansancio o debilidad inusual, desmayos, aturdimiento -problemas respiratorios -orina oscura -mareos -palpitaciones -hinchazn de tobillos, pies o manos -dificultad para orinar o cambios en el volumen de orina -aumento de peso -color amarillento de los ojos o la piel Efectos secundarios que, por lo general, no requieren Geophysical data processor (debe informarlos a su mdico o a su profesional de la salud si persisten o si son molestos): -cambios en el color de las uas o piel -cada del cabello -ausencia de perodos menstruales -llagas en la boca -nuseas, vmito Puede ser que [employer] no menciona todos los posibles efectos secundarios. Comunquese a su mdico por asesoramiento mdico [employer] Usted puede informar los efectos secundarios a la FDA por telfono al 1-800-FDA-1088. Dnde debo guardar mi medicina? Este medicamento se administra en hospitales o clnicas y no necesitar guardarlo en su domicilio. ATENCIN: Este folleto es un resumen. Puede ser que no cubra toda la posible informacin. Si usted tiene preguntas acerca de esta medicina, consulte con su mdico, su farmacutico o su profesional de Technical sales engineer.    2016, Elsevier/Gold Standard. (2014-06-06 00:00:00)

## 2015-10-04 NOTE — Assessment & Plan Note (Signed)
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%; HER-2 negative ratio 1.04; 10:00: IDC grade 2-3 dissimilar to 1:00 biopsy 10:00: ER 95%, PR 0%, K67: 25%, HER-2 positive ratio 6.42 right axillary lymph node positive for metastatic cancer (LN is Her 2 neg) --------------------------------------------------------------------------------- Left breast LIQ 08/20/2015: 4.2 cm, 4 mm oval mass 12:00, 1.2 cm mass left UOQ, enlarged left axillary lymph node, T2 N1 stage II B clinical stage  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 CT CAP and Bone scan 08/27/15: Bil Breast masses, Bil Axill LN, Bil lower lobe lung nodules indeterminate 7 mm  --------------------------------------------------------------------------------------------------------------------------------------- Treatment plan: 1. Neoadjuvant chemotherapy with either AC followed by THP (followed by CT chest to evaluate the lung nodules and repeat breast MRI) 2. Followed by bilateral mastectomies and lymph node surgery 3. Followed by adjuvant radiation 4. Followed by adjuvant antiestrogen therapy ------------------------------------------------------------------------------------------------------------------------------------ Current treatment: Cycle 3 day 1 dose dense Adriamycin and Cytoxan Echocardiogram 08/24/2015: EF 60-65% Chemotherapy toxicities: 1. Mild nausea 2. Mild fatigue and headache 3. Mouth sores: Given a prescription for Magic mouthwash Return to clinic in 2 weeks for cycle 4

## 2015-10-04 NOTE — Telephone Encounter (Signed)
appt made and avs printed °

## 2015-10-04 NOTE — Progress Notes (Signed)
Patient Care Team: No Pcp Per Patient as PCP - General (General Practice)  SUMMARY OF ONCOLOGIC HISTORY:   Bilateral breast cancer ([place])   08/16/2015 Mammogram Left breast LIQ 4.2 cm, 4 mm oval mass 12:00, 1.2 cm mass left UOQ, enlarged left axillary lymph node   08/16/2015 Mammogram Right breast UIQ: 3.9 cm irregular mass, 1.5 cm mass in the right LOQ, 9 mm mass right UOQ, 6 mm mass right LIQ, enlarged lymph node Rt axilla   08/20/2015 Initial Diagnosis Right breast biopsy 1:00: IDC grade 2 ER 95%, PR 2%, Ki 67 15%; HER-2 negative ratio 1.04; 10:00: IDC grade 2-3 dissimilar to 1:00 biopsy 10:00: ER 95%, PR 0%, K67: 25%, HER-2 positive ratio 6.42 right axillary lymph node positive for metastatic cancer   08/20/2015 Procedure Left biopsy 8:00: IDC with DCIS, grade 2; ER 95%, PR 95%, Ki-67 30%, HER-2 negative ratio 1.2; 12:00: IDC with DCIS, grade 1 ER 95%, PR 95%, Ki-67 10% HER-2 negative ratio 1.14 not similar to this biopsy, left axillary lymph node positive,    09/13/2015 -  Neo-Adjuvant Chemotherapy Neoadjuvant dose dense Adriamycin and Cytoxan followed by Taxol Herceptin Perjeta    CHIEF COMPLIANT: cycle 3 AC  INTERVAL HISTORY: Felicia [name] is a 55 yr old on neoadj chemo with dose dense AC and today is cycle 3. Shes tolerating chemo fairly well. Denies nausea or vomiting, mouth sore went away. She took magic mouth wash.  REVIEW OF SYSTEMS:   Constitutional: Denies fevers, chills or abnormal weight loss Eyes: Denies blurriness of vision Ears, nose, mouth, throat, and face: Denies mucositis or sore throat Respiratory: Denies cough, dyspnea or wheezes Cardiovascular: Denies palpitation, chest discomfort Gastrointestinal:  Denies nausea, heartburn or change in bowel habits Skin: Denies abnormal skin rashes Lymphatics: Denies new lymphadenopathy or easy bruising Neurological:Denies numbness, tingling or new weaknesses Behavioral/Psych: Mood is stable, no new changes  Extremities: No  lower extremity edema Breast:  Dec in size of lumps All other systems were reviewed with the patient and are negative.  I have reviewed the past medical history, past surgical history, social history and family history with the patient and they are unchanged from previous note.  ALLERGIES:  has No Known Allergies.  MEDICATIONS:  Current Outpatient Prescriptions  Medication Sig Dispense Refill  . dexamethasone (DECADRON) 4 MG tablet Take 1 tablet (4 mg total) by mouth daily. 1 tab daily x 2 days start day after chemo 30 tablet 1  . HYDROcodone-acetaminophen (NORCO) 10-325 MG tablet Take 1 tablet by mouth every 6 (six) hours as needed. 10 tablet 0  . lidocaine-prilocaine (EMLA) cream Apply to affected area once 30 g 3  . LORazepam (ATIVAN) 0.5 MG tablet Take 1 tablet (0.5 mg total) by mouth every 6 (six) hours as needed (Nausea or vomiting). 30 tablet 0  . magic mouthwash w/lidocaine SOLN Take 5 mLs by mouth 3 (three) times daily as needed for mouth pain. 100 mL 0  . naproxen (NAPROSYN) 500 MG tablet Take 500 mg by mouth 2 (two) times daily with a meal.    . ondansetron (ZOFRAN) 8 MG tablet Take 1 tablet (8 mg total) by mouth 2 (two) times daily as needed. Start on the third day after chemotherapy. 30 tablet 1  . prochlorperazine (COMPAZINE) 10 MG tablet Take 1 tablet (10 mg total) by mouth every 6 (six) hours as needed (Nausea or vomiting). 30 tablet 1   No current facility-administered medications for this visit.    PHYSICAL EXAMINATION: ECOG PERFORMANCE  STATUS: 1 - Symptomatic but completely ambulatory  Filed Vitals:   10/04/15 1022  BP: 142/82  Pulse: 82  Temp: 98.3 F (36.8 C)  Resp: 18   Filed Weights   10/04/15 1022  Weight: 235 lb 3.2 oz (106.686 kg)    GENERAL:alert, no distress and comfortable SKIN: skin color, texture, turgor are normal, no rashes or significant lesions EYES: normal, Conjunctiva are pink and non-injected, sclera clear OROPHARYNX:no exudate, no  erythema and lips, buccal mucosa, and tongue normal  NECK: supple, thyroid normal size, non-tender, without nodularity LYMPH:  no palpable lymphadenopathy in the cervical, axillary or inguinal LUNGS: clear to auscultation and percussion with normal breathing effort HEART: regular rate & rhythm and no murmurs and no lower extremity edema ABDOMEN:abdomen soft, non-tender and normal bowel sounds MUSCULOSKELETAL:no cyanosis of digits and no clubbing  NEURO: alert & oriented x 3 with fluent speech, no focal motor/sensory deficits EXTREMITIES: No lower extremity edema  LABORATORY DATA:  I have reviewed the data as listed   Chemistry      Component Value Date/Time   NA 140 09/20/2015 0857   K 3.2* 09/20/2015 0857   CO2 22 09/20/2015 0857   BUN 8.9 09/20/2015 0857   CREATININE 0.7 09/20/2015 0857      Component Value Date/Time   CALCIUM 9.2 09/20/2015 0857   ALKPHOS 96 09/20/2015 0857   AST 25 09/20/2015 0857   ALT 33 09/20/2015 0857   BILITOT 0.31 09/20/2015 0857       Lab Results  Component Value Date   WBC 8.8 09/20/2015   HGB 14.1 09/20/2015   HCT 41.4 09/20/2015   MCV 85.5 09/20/2015   PLT 142* 09/20/2015   NEUTROABS 6.6* 09/20/2015     ASSESSMENT & PLAN:  Bilateral breast cancer ([facility]) 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%; HER-2 negative ratio 1.04; 10:00: IDC grade 2-3 dissimilar to 1:00 biopsy 10:00: ER 95%, PR 0%, K67: 25%, HER-2 positive ratio 6.42 right axillary lymph node positive for metastatic cancer (LN is Her 2 neg) --------------------------------------------------------------------------------- Left breast LIQ 08/20/2015: 4.2 cm, 4 mm oval mass 12:00, 1.2 cm mass left UOQ, enlarged left axillary lymph node, T2 N1 stage II B clinical stage  Left  biopsy 8:00: IDC with DCIS, grade 2; ER 95%, PR 95%, Ki-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 CT CAP and Bone scan 08/27/15: Bil Breast masses, Bil Axill LN, Bil lower lobe lung nodules indeterminate 7 mm  --------------------------------------------------------------------------------------------------------------------------------------- Treatment plan: 1. Neoadjuvant chemotherapy with either AC followed by THP (followed by CT chest to evaluate the lung nodules and repeat breast MRI) 2. Followed by bilateral mastectomies and lymph node surgery 3. Followed by adjuvant radiation 4. Followed by adjuvant antiestrogen therapy ------------------------------------------------------------------------------------------------------------------------------------ Current treatment: Cycle 3 day 1 dose dense Adriamycin and Cytoxan Echocardiogram 08/24/2015: EF 60-65% Chemotherapy toxicities: 1. Mild nausea 2. Mild fatigue and headache 3. Mouth sores: Given a prescription for Magic mouthwash Return to clinic in 2 weeks for cycle 4   No orders of the defined types were placed in this encounter.   The patient has a good understanding  of the overall plan. she agrees with it. she will call with any problems that may develop before the next visit here.   Rulon Eisenmenger, MD 10/04/2015

## 2015-10-18 ENCOUNTER — Ambulatory Visit (HOSPITAL_BASED_OUTPATIENT_CLINIC_OR_DEPARTMENT_OTHER): Payer: Self-pay | Admitting: Hematology and Oncology

## 2015-10-18 ENCOUNTER — Telehealth: Payer: Self-pay | Admitting: Hematology and Oncology

## 2015-10-18 ENCOUNTER — Encounter: Payer: Self-pay | Admitting: Hematology and Oncology

## 2015-10-18 ENCOUNTER — Ambulatory Visit (HOSPITAL_BASED_OUTPATIENT_CLINIC_OR_DEPARTMENT_OTHER): Payer: Self-pay

## 2015-10-18 ENCOUNTER — Other Ambulatory Visit (HOSPITAL_BASED_OUTPATIENT_CLINIC_OR_DEPARTMENT_OTHER): Payer: Self-pay

## 2015-10-18 VITALS — BP 139/96 | HR 87 | Temp 97.8°F | Resp 19 | Wt 232.5 lb

## 2015-10-18 DIAGNOSIS — C50312 Malignant neoplasm of lower-inner quadrant of left female breast: Secondary | ICD-10-CM

## 2015-10-18 DIAGNOSIS — C50211 Malignant neoplasm of upper-inner quadrant of right female breast: Secondary | ICD-10-CM

## 2015-10-18 DIAGNOSIS — C50812 Malignant neoplasm of overlapping sites of left female breast: Secondary | ICD-10-CM

## 2015-10-18 DIAGNOSIS — C773 Secondary and unspecified malignant neoplasm of axilla and upper limb lymph nodes: Secondary | ICD-10-CM

## 2015-10-18 DIAGNOSIS — C50411 Malignant neoplasm of upper-outer quadrant of right female breast: Secondary | ICD-10-CM

## 2015-10-18 DIAGNOSIS — C50811 Malignant neoplasm of overlapping sites of right female breast: Secondary | ICD-10-CM

## 2015-10-18 DIAGNOSIS — Z5111 Encounter for antineoplastic chemotherapy: Secondary | ICD-10-CM

## 2015-10-18 LAB — CBC WITH DIFFERENTIAL/PLATELET
BASO%: 0.5 % (ref 0.0–2.0)
Basophils Absolute: 0.1 10*3/uL (ref 0.0–0.1)
EOS%: 0.3 % (ref 0.0–7.0)
Eosinophils Absolute: 0 10*3/uL (ref 0.0–0.5)
HCT: 39.4 % (ref 34.8–46.6)
HGB: 13.4 g/dL (ref 11.6–15.9)
LYMPH#: 1.5 10*3/uL (ref 0.9–3.3)
LYMPH%: 14.7 % (ref 14.0–49.7)
MCH: 29.5 pg (ref 25.1–34.0)
MCHC: 34 g/dL (ref 31.5–36.0)
MCV: 86.6 fL (ref 79.5–101.0)
MONO#: 1.2 10*3/uL — ABNORMAL HIGH (ref 0.1–0.9)
MONO%: 11.4 % (ref 0.0–14.0)
NEUT#: 7.5 10*3/uL — ABNORMAL HIGH (ref 1.5–6.5)
NEUT%: 73.1 % (ref 38.4–76.8)
Platelets: 153 10*3/uL (ref 145–400)
RBC: 4.55 10*6/uL (ref 3.70–5.45)
RDW: 15.1 % — ABNORMAL HIGH (ref 11.2–14.5)
WBC: 10.2 10*3/uL (ref 3.9–10.3)

## 2015-10-18 LAB — COMPREHENSIVE METABOLIC PANEL
ALT: 32 U/L (ref 0–55)
AST: 25 U/L (ref 5–34)
Albumin: 3.7 g/dL (ref 3.5–5.0)
Alkaline Phosphatase: 88 U/L (ref 40–150)
Anion Gap: 9 mEq/L (ref 3–11)
BUN: 8.9 mg/dL (ref 7.0–26.0)
CHLORIDE: 107 meq/L (ref 98–109)
CO2: 23 meq/L (ref 22–29)
CREATININE: 0.7 mg/dL (ref 0.6–1.1)
Calcium: 9.1 mg/dL (ref 8.4–10.4)
EGFR: >90 mL/min/{1.73_m2} (ref >90)
Glucose: 135 mg/dl (ref 70–140)
Potassium: 3.4 mEq/L — ABNORMAL LOW (ref 3.5–5.1)
SODIUM: 139 meq/L (ref 136–145)
Total Bilirubin: 0.45 mg/dL (ref 0.20–1.20)
Total Protein: 7.3 g/dL (ref 6.4–8.3)

## 2015-10-18 MED ORDER — PEGFILGRASTIM 6 MG/0.6ML ~~LOC~~ PSKT
6.0000 mg | PREFILLED_SYRINGE | Freq: Once | SUBCUTANEOUS | Status: AC
  Administered 2015-10-18: 6 mg via SUBCUTANEOUS
  Filled 2015-10-18: qty 0.6

## 2015-10-18 MED ORDER — HEPARIN SOD (PORK) LOCK FLUSH 100 UNIT/ML IV SOLN
500.0000 [IU] | Freq: Once | INTRAVENOUS | Status: AC | PRN
  Administered 2015-10-18: 500 [IU]
  Filled 2015-10-18: qty 5

## 2015-10-18 MED ORDER — SODIUM CHLORIDE 0.9% FLUSH
10.0000 mL | INTRAVENOUS | Status: DC | PRN
  Administered 2015-10-18: 10 mL
  Filled 2015-10-18: qty 10

## 2015-10-18 MED ORDER — SODIUM CHLORIDE 0.9 % IV SOLN
600.0000 mg/m2 | Freq: Once | INTRAVENOUS | Status: AC
  Administered 2015-10-18: 1280 mg via INTRAVENOUS
  Filled 2015-10-18: qty 64

## 2015-10-18 MED ORDER — SODIUM CHLORIDE 0.9 % IV SOLN
Freq: Once | INTRAVENOUS | Status: AC
  Administered 2015-10-18: 13:00:00 via INTRAVENOUS
  Filled 2015-10-18: qty 5

## 2015-10-18 MED ORDER — PALONOSETRON HCL INJECTION 0.25 MG/5ML
INTRAVENOUS | Status: AC
  Filled 2015-10-18: qty 5

## 2015-10-18 MED ORDER — DOXORUBICIN HCL CHEMO IV INJECTION 2 MG/ML
60.0000 mg/m2 | Freq: Once | INTRAVENOUS | Status: AC
  Administered 2015-10-18: 128 mg via INTRAVENOUS
  Filled 2015-10-18: qty 64

## 2015-10-18 MED ORDER — SODIUM CHLORIDE 0.9 % IV SOLN
Freq: Once | INTRAVENOUS | Status: AC
  Administered 2015-10-18: 13:00:00 via INTRAVENOUS

## 2015-10-18 MED ORDER — PALONOSETRON HCL INJECTION 0.25 MG/5ML
0.2500 mg | Freq: Once | INTRAVENOUS | Status: AC
  Administered 2015-10-18: 0.25 mg via INTRAVENOUS

## 2015-10-18 NOTE — Patient Instructions (Addendum)
[facility] Discharge Instructions for Patients Receiving Chemotherapy  Today you received the following chemotherapy agents: Adriamycin and Cytoxan and Neulasta/ONpro  To help prevent nausea and vomiting after your treatment, we encourage you to take your nausea medication as presecribed by MD. DO NOT take Zofran for 3 days, starting today. You may use Compazine and/or Ativan if having a problem with nausea.  If you develop nausea and vomiting that is not controlled by your nausea medication, call the clinic.   BELOW ARE SYMPTOMS THAT SHOULD BE REPORTED IMMEDIATELY:  *FEVER GREATER THAN 100.5 F  *CHILLS WITH OR WITHOUT FEVER  NAUSEA AND VOMITING THAT IS NOT CONTROLLED WITH YOUR NAUSEA MEDICATION  *UNUSUAL SHORTNESS OF BREATH  *UNUSUAL BRUISING OR BLEEDING  TENDERNESS IN MOUTH AND THROAT WITH OR WITHOUT PRESENCE OF ULCERS  *URINARY PROBLEMS  *BOWEL PROBLEMS  UNUSUAL RASH Items with * indicate a potential emergency and should be followed up as soon as possible.  Feel free to call the clinic you have any questions or concerns. The clinic phone number is [phone].  Please show the [facility] at check-in to the Emergency Department and triage nurse.

## 2015-10-18 NOTE — Telephone Encounter (Signed)
appt made and avs printed °

## 2015-10-18 NOTE — Assessment & Plan Note (Signed)
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%; HER-2 negative ratio 1.04; 10:00: IDC grade 2-3 dissimilar to 1:00 biopsy 10:00: ER 95%, PR 0%, K67: 25%, HER-2 positive ratio 6.42 right axillary lymph node positive for metastatic cancer (LN is Her 2 neg) --------------------------------------------------------------------------------- Left breast LIQ 08/20/2015: 4.2 cm, 4 mm oval mass 12:00, 1.2 cm mass left UOQ, enlarged left axillary lymph node, T2 N1 stage II B clinical stage  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 CT CAP and Bone scan 08/27/15: Bil Breast masses, Bil Axill LN, Bil lower lobe lung nodules indeterminate 7 mm  --------------------------------------------------------------------------------------------------------------------------------------- Treatment plan: 1. Neoadjuvant chemotherapy with either AC followed by THP (followed by CT chest to evaluate the lung nodules and repeat breast MRI) 2. Followed by bilateral mastectomies and lymph node surgery 3. Followed by adjuvant radiation 4. Followed by adjuvant antiestrogen therapy ------------------------------------------------------------------------------------------------------------------------------------ Current treatment: Cycle 4 day 1 dose dense Adriamycin and Cytoxan Echocardiogram 08/24/2015: EF 60-65% Chemotherapy toxicities: 1. Mild nausea 2. Mild fatigue and headache 3. Mouth sores: Given a prescription for Magic mouthwash Return to clinic in 2 weeks for cycle 1/12 THP

## 2015-10-18 NOTE — Progress Notes (Signed)
Patient Care Team: No Pcp Per Patient as PCP - General (General Practice)  SUMMARY OF ONCOLOGIC HISTORY:   Bilateral breast cancer ([place])   08/16/2015 Mammogram Left breast LIQ 4.2 cm, 4 mm oval mass 12:00, 1.2 cm mass left UOQ, enlarged left axillary lymph node   08/16/2015 Mammogram Right breast UIQ: 3.9 cm irregular mass, 1.5 cm mass in the right LOQ, 9 mm mass right UOQ, 6 mm mass right LIQ, enlarged lymph node Rt axilla   08/20/2015 Initial Diagnosis Right breast biopsy 1:00: IDC grade 2 ER 95%, PR 2%, Ki 67 15%; HER-2 negative ratio 1.04; 10:00: IDC grade 2-3 dissimilar to 1:00 biopsy 10:00: ER 95%, PR 0%, K67: 25%, HER-2 positive ratio 6.42 right axillary lymph node positive for metastatic cancer   08/20/2015 Procedure Left biopsy 8:00: IDC with DCIS, grade 2; ER 95%, PR 95%, Ki-67 30%, HER-2 negative ratio 1.2; 12:00: IDC with DCIS, grade 1 ER 95%, PR 95%, Ki-67 10% HER-2 negative ratio 1.14 not similar to this biopsy, left axillary lymph node positive,    09/13/2015 -  Neo-Adjuvant Chemotherapy Neoadjuvant dose dense Adriamycin and Cytoxan followed by Taxol Herceptin Perjeta    CHIEF COMPLIANT: Cycle 4 of dose dense Adriamycin and Cytoxan  INTERVAL HISTORY: Felicia [name] is a 55 year old with above-mentioned history of breast cancer currently on neoadjuvant chemotherapy. Cycle 4 of dose dense Adriamycin and Cytoxan. She complains of occasional sore throat and cough. Denies any fevers or chills. Denies any nausea or vomiting. She does feel fatigued.  REVIEW OF SYSTEMS:   Constitutional: Denies fevers, chills or abnormal weight loss Eyes: Denies blurriness of vision Ears, nose, mouth, throat, and face: Denies mucositis or sore throat Respiratory: Intermittent cough Cardiovascular: Denies palpitation, chest discomfort Gastrointestinal:  Denies nausea, heartburn or change in bowel habits Skin: Denies abnormal skin rashes Lymphatics: Denies new lymphadenopathy or easy  bruising Neurological:Denies numbness, tingling or new weaknesses Behavioral/Psych: Mood is stable, no new changes  Extremities: No lower extremity edema  All other systems were reviewed with the patient and are negative.  I have reviewed the past medical history, past surgical history, social history and family history with the patient and they are unchanged from previous note.  ALLERGIES:  has No Known Allergies.  MEDICATIONS:  Current Outpatient Prescriptions  Medication Sig Dispense Refill  . dexamethasone (DECADRON) 4 MG tablet Take 1 tablet (4 mg total) by mouth daily. 1 tab daily x 2 days start day after chemo 30 tablet 1  . HYDROcodone-acetaminophen (NORCO) 10-325 MG tablet Take 1 tablet by mouth every 6 (six) hours as needed. 10 tablet 0  . lidocaine-prilocaine (EMLA) cream Apply to affected area once 30 g 3  . LORazepam (ATIVAN) 0.5 MG tablet Take 1 tablet (0.5 mg total) by mouth every 6 (six) hours as needed (Nausea or vomiting). 30 tablet 0  . magic mouthwash w/lidocaine SOLN Take 5 mLs by mouth 3 (three) times daily as needed for mouth pain. 100 mL 0  . naproxen (NAPROSYN) 500 MG tablet Take 500 mg by mouth 2 (two) times daily with a meal.    . ondansetron (ZOFRAN) 8 MG tablet Take 1 tablet (8 mg total) by mouth 2 (two) times daily as needed. Start on the third day after chemotherapy. 30 tablet 1  . prochlorperazine (COMPAZINE) 10 MG tablet Take 1 tablet (10 mg total) by mouth every 6 (six) hours as needed (Nausea or vomiting). 30 tablet 1   No current facility-administered medications for this visit.  PHYSICAL EXAMINATION: ECOG PERFORMANCE STATUS: 1 - Symptomatic but completely ambulatory  Filed Vitals:   10/18/15 1034  BP: 139/96  Pulse: 87  Temp: 97.8 F (36.6 C)  Resp: 19   Filed Weights   10/18/15 1034  Weight: 232 lb 8 oz (105.461 kg)    GENERAL:alert, no distress and comfortable SKIN: skin color, texture, turgor are normal, no rashes or significant  lesions EYES: normal, Conjunctiva are pink and non-injected, sclera clear OROPHARYNX:no exudate, no erythema and lips, buccal mucosa, and tongue normal  NECK: supple, thyroid normal size, non-tender, without nodularity LYMPH:  no palpable lymphadenopathy in the cervical, axillary or inguinal LUNGS: clear to auscultation and percussion with normal breathing effort HEART: regular rate & rhythm and no murmurs and no lower extremity edema ABDOMEN:abdomen soft, non-tender and normal bowel sounds MUSCULOSKELETAL:no cyanosis of digits and no clubbing  NEURO: alert & oriented x 3 with fluent speech, no focal motor/sensory deficits EXTREMITIES: No lower extremity edema  LABORATORY DATA:  I have reviewed the data as listed   Chemistry      Component Value Date/Time   NA 139 10/18/2015 1023   K 3.4* 10/18/2015 1023   CO2 23 10/18/2015 1023   BUN 8.9 10/18/2015 1023   CREATININE 0.7 10/18/2015 1023      Component Value Date/Time   CALCIUM 9.1 10/18/2015 1023   ALKPHOS 88 10/18/2015 1023   AST 25 10/18/2015 1023   ALT 32 10/18/2015 1023   BILITOT 0.45 10/18/2015 1023       Lab Results  Component Value Date   WBC 10.2 10/18/2015   HGB 13.4 10/18/2015   HCT 39.4 10/18/2015   MCV 86.6 10/18/2015   PLT 153 10/18/2015   NEUTROABS 7.5* 10/18/2015     ASSESSMENT & PLAN:  Bilateral breast cancer (HCC) 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%; HER-2 negative ratio 1.04; 10:00: IDC grade 2-3 dissimilar to 1:00 biopsy 10:00: ER 95%, PR 0%, K67: 25%, HER-2 positive ratio 6.42 right axillary lymph node positive for metastatic cancer (LN is Her 2 neg) --------------------------------------------------------------------------------- Left breast LIQ 08/20/2015: 4.2 cm, 4 mm oval mass  12:00, 1.2 cm mass left UOQ, enlarged left axillary lymph node, T2 N1 stage II B clinical stage  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 CT CAP and Bone scan 08/27/15: Bil Breast masses, Bil Axill LN, Bil lower lobe lung nodules indeterminate 7 mm  --------------------------------------------------------------------------------------------------------------------------------------- Treatment plan: 1. Neoadjuvant chemotherapy with either AC followed by THP (followed by CT chest to evaluate the lung nodules and repeat breast MRI) 2. Followed by bilateral mastectomies and lymph node surgery 3. Followed by adjuvant radiation 4. Followed by adjuvant antiestrogen therapy ------------------------------------------------------------------------------------------------------------------------------------ Current treatment: Cycle 4 day 1 dose dense Adriamycin and Cytoxan Echocardiogram 08/24/2015: EF 60-65% Chemotherapy toxicities: 1. Mild nausea 2. Mild fatigue and headache 3. Mouth sores: Given a prescription for Magic mouthwash Return to clinic in 2 weeks for cycle 1/12 THP Patient will get weekly Taxol and every three-week Herceptin and Perjeta   No orders of  the defined types were placed in this encounter.   The patient has a good understanding of the overall plan. she agrees with it. she will call with any problems that may develop before the next visit here.   Rulon Eisenmenger, MD 10/18/2015

## 2015-11-01 ENCOUNTER — Encounter: Payer: Self-pay | Admitting: Pharmacist

## 2015-11-01 ENCOUNTER — Ambulatory Visit (HOSPITAL_BASED_OUTPATIENT_CLINIC_OR_DEPARTMENT_OTHER): Payer: Self-pay

## 2015-11-01 ENCOUNTER — Ambulatory Visit (HOSPITAL_BASED_OUTPATIENT_CLINIC_OR_DEPARTMENT_OTHER): Payer: Self-pay | Admitting: Nurse Practitioner

## 2015-11-01 ENCOUNTER — Encounter: Payer: Self-pay | Admitting: Hematology and Oncology

## 2015-11-01 ENCOUNTER — Ambulatory Visit (HOSPITAL_BASED_OUTPATIENT_CLINIC_OR_DEPARTMENT_OTHER): Payer: Self-pay | Admitting: Hematology and Oncology

## 2015-11-01 ENCOUNTER — Telehealth: Payer: Self-pay | Admitting: Hematology and Oncology

## 2015-11-01 ENCOUNTER — Encounter: Payer: Self-pay | Admitting: *Deleted

## 2015-11-01 ENCOUNTER — Other Ambulatory Visit (HOSPITAL_BASED_OUTPATIENT_CLINIC_OR_DEPARTMENT_OTHER): Payer: Self-pay

## 2015-11-01 VITALS — BP 114/69 | HR 100 | Temp 98.0°F | Resp 18

## 2015-11-01 VITALS — BP 132/79 | HR 75 | Temp 97.8°F | Resp 18 | Ht 59.0 in | Wt 231.7 lb

## 2015-11-01 DIAGNOSIS — C50312 Malignant neoplasm of lower-inner quadrant of left female breast: Secondary | ICD-10-CM

## 2015-11-01 DIAGNOSIS — C50811 Malignant neoplasm of overlapping sites of right female breast: Secondary | ICD-10-CM

## 2015-11-01 DIAGNOSIS — Z5112 Encounter for antineoplastic immunotherapy: Secondary | ICD-10-CM

## 2015-11-01 DIAGNOSIS — C773 Secondary and unspecified malignant neoplasm of axilla and upper limb lymph nodes: Secondary | ICD-10-CM

## 2015-11-01 DIAGNOSIS — R51 Headache: Secondary | ICD-10-CM

## 2015-11-01 DIAGNOSIS — C50812 Malignant neoplasm of overlapping sites of left female breast: Secondary | ICD-10-CM

## 2015-11-01 DIAGNOSIS — K1370 Unspecified lesions of oral mucosa: Secondary | ICD-10-CM

## 2015-11-01 DIAGNOSIS — C50211 Malignant neoplasm of upper-inner quadrant of right female breast: Secondary | ICD-10-CM

## 2015-11-01 DIAGNOSIS — C50411 Malignant neoplasm of upper-outer quadrant of right female breast: Secondary | ICD-10-CM

## 2015-11-01 DIAGNOSIS — R110 Nausea: Secondary | ICD-10-CM

## 2015-11-01 DIAGNOSIS — Z95828 Presence of other vascular implants and grafts: Secondary | ICD-10-CM

## 2015-11-01 DIAGNOSIS — T7840XA Allergy, unspecified, initial encounter: Secondary | ICD-10-CM

## 2015-11-01 DIAGNOSIS — R530 Neoplastic (malignant) related fatigue: Secondary | ICD-10-CM

## 2015-11-01 LAB — COMPREHENSIVE METABOLIC PANEL
ALT: 26 U/L (ref 0–55)
AST: 21 U/L (ref 5–34)
Albumin: 3.7 g/dL (ref 3.5–5.0)
Alkaline Phosphatase: 96 U/L (ref 40–150)
Anion Gap: 10 mEq/L (ref 3–11)
BUN: 10.1 mg/dL (ref 7.0–26.0)
CHLORIDE: 107 meq/L (ref 98–109)
CO2: 22 meq/L (ref 22–29)
Calcium: 9.6 mg/dL (ref 8.4–10.4)
Creatinine: 0.7 mg/dL (ref 0.6–1.1)
EGFR: >90 mL/min/{1.73_m2} (ref >90)
GLUCOSE: 119 mg/dL (ref 70–140)
POTASSIUM: 3.6 meq/L (ref 3.5–5.1)
SODIUM: 139 meq/L (ref 136–145)
Total Bilirubin: 0.4 mg/dL (ref 0.20–1.20)
Total Protein: 7.2 g/dL (ref 6.4–8.3)

## 2015-11-01 LAB — CBC WITH DIFFERENTIAL/PLATELET
BASO%: 0.4 % (ref 0.0–2.0)
BASOS ABS: 0.1 10*3/uL (ref 0.0–0.1)
EOS%: 0.1 % (ref 0.0–7.0)
Eosinophils Absolute: 0 10*3/uL (ref 0.0–0.5)
HCT: 37.3 % (ref 34.8–46.6)
HEMOGLOBIN: 12.7 g/dL (ref 11.6–15.9)
LYMPH%: 12.1 % — AB (ref 14.0–49.7)
MCH: 29.6 pg (ref 25.1–34.0)
MCHC: 34 g/dL (ref 31.5–36.0)
MCV: 86.9 fL (ref 79.5–101.0)
MONO#: 1.1 10*3/uL — ABNORMAL HIGH (ref 0.1–0.9)
MONO%: 9.4 % (ref 0.0–14.0)
NEUT#: 8.8 10*3/uL — ABNORMAL HIGH (ref 1.5–6.5)
NEUT%: 78 % — AB (ref 38.4–76.8)
Platelets: 141 10*3/uL — ABNORMAL LOW (ref 145–400)
RBC: 4.29 10*6/uL (ref 3.70–5.45)
RDW: 16.2 % — AB (ref 11.2–14.5)
WBC: 11.3 10*3/uL — ABNORMAL HIGH (ref 3.9–10.3)
lymph#: 1.4 10*3/uL (ref 0.9–3.3)

## 2015-11-01 MED ORDER — LORAZEPAM 2 MG/ML IJ SOLN
INTRAMUSCULAR | Status: AC
  Filled 2015-11-01: qty 1

## 2015-11-01 MED ORDER — SODIUM CHLORIDE 0.9 % IV SOLN
Freq: Once | INTRAVENOUS | Status: AC
  Administered 2015-11-01: 16:00:00 via INTRAVENOUS
  Filled 2015-11-01: qty 4

## 2015-11-01 MED ORDER — DIPHENHYDRAMINE HCL 50 MG/ML IJ SOLN
INTRAMUSCULAR | Status: AC
  Filled 2015-11-01: qty 1

## 2015-11-01 MED ORDER — DIPHENHYDRAMINE HCL 25 MG PO CAPS
ORAL_CAPSULE | ORAL | Status: AC
  Filled 2015-11-01: qty 1

## 2015-11-01 MED ORDER — SODIUM CHLORIDE 0.9% FLUSH
10.0000 mL | INTRAVENOUS | Status: DC | PRN
  Administered 2015-11-01: 10 mL via INTRAVENOUS
  Filled 2015-11-01: qty 10

## 2015-11-01 MED ORDER — FAMOTIDINE IN NACL 20-0.9 MG/50ML-% IV SOLN
INTRAVENOUS | Status: AC
  Filled 2015-11-01: qty 50

## 2015-11-01 MED ORDER — HYDROMORPHONE HCL 4 MG/ML IJ SOLN
INTRAMUSCULAR | Status: AC
  Filled 2015-11-01: qty 4

## 2015-11-01 MED ORDER — PALONOSETRON HCL INJECTION 0.25 MG/5ML
INTRAVENOUS | Status: AC
  Filled 2015-11-01: qty 5

## 2015-11-01 MED ORDER — ACETAMINOPHEN 325 MG PO TABS
ORAL_TABLET | ORAL | Status: AC
  Filled 2015-11-01: qty 2

## 2015-11-01 MED ORDER — PALONOSETRON HCL INJECTION 0.25 MG/5ML
0.2500 mg | Freq: Once | INTRAVENOUS | Status: AC
  Administered 2015-11-01: 0.25 mg via INTRAVENOUS

## 2015-11-01 MED ORDER — SODIUM CHLORIDE 0.9 % IV SOLN
Freq: Once | INTRAVENOUS | Status: AC
  Administered 2015-11-01: 12:00:00 via INTRAVENOUS

## 2015-11-01 MED ORDER — SODIUM CHLORIDE 0.9 % IV SOLN
840.0000 mg | Freq: Once | INTRAVENOUS | Status: AC
  Administered 2015-11-01: 840 mg via INTRAVENOUS
  Filled 2015-11-01: qty 28

## 2015-11-01 MED ORDER — DIPHENHYDRAMINE HCL 25 MG PO CAPS: 50.0000 mg | ORAL_CAPSULE | Freq: Once | ORAL | Status: DC

## 2015-11-01 MED ORDER — DIPHENHYDRAMINE HCL 50 MG/ML IJ SOLN: 25.0000 mg | Freq: Once | INTRAMUSCULAR | Status: DC

## 2015-11-01 MED ORDER — SODIUM CHLORIDE 0.9 % IV SOLN: Freq: Once | INTRAVENOUS | Status: DC

## 2015-11-01 MED ORDER — PACLITAXEL CHEMO INJECTION 300 MG/50ML
80.0000 mg/m2 | Freq: Once | INTRAVENOUS | Status: DC
  Filled 2015-11-01: qty 28

## 2015-11-01 MED ORDER — MEPERIDINE HCL 25 MG/ML IJ SOLN
INTRAMUSCULAR | Status: AC
  Filled 2015-11-01: qty 1

## 2015-11-01 MED ORDER — FAMOTIDINE IN NACL 20-0.9 MG/50ML-% IV SOLN
20.0000 mg | Freq: Once | INTRAVENOUS | Status: AC | PRN
  Administered 2015-11-01: 20 mg via INTRAVENOUS

## 2015-11-01 MED ORDER — HEPARIN SOD (PORK) LOCK FLUSH 100 UNIT/ML IV SOLN
500.0000 [IU] | Freq: Once | INTRAVENOUS | Status: AC
  Administered 2015-11-01: 500 [IU] via INTRAVENOUS
  Filled 2015-11-01: qty 5

## 2015-11-01 MED ORDER — LORAZEPAM 2 MG/ML IJ SOLN
0.5000 mg | Freq: Once | INTRAMUSCULAR | Status: AC
  Administered 2015-11-01: 0.5 mg via INTRAVENOUS

## 2015-11-01 MED ORDER — DIPHENHYDRAMINE HCL 25 MG PO CAPS
25.0000 mg | ORAL_CAPSULE | Freq: Once | ORAL | Status: AC
  Administered 2015-11-01: 25 mg via ORAL

## 2015-11-01 MED ORDER — ACETAMINOPHEN 325 MG PO TABS
650.0000 mg | ORAL_TABLET | Freq: Once | ORAL | Status: AC
  Administered 2015-11-01: 650 mg via ORAL

## 2015-11-01 MED ORDER — METHYLPREDNISOLONE SODIUM SUCC 125 MG IJ SOLR
125.0000 mg | Freq: Once | INTRAMUSCULAR | Status: AC | PRN
  Administered 2015-11-01: 125 mg via INTRAVENOUS

## 2015-11-01 MED ORDER — DIPHENHYDRAMINE HCL 50 MG/ML IJ SOLN
25.0000 mg | Freq: Once | INTRAMUSCULAR | Status: AC | PRN
  Administered 2015-11-01: 25 mg via INTRAVENOUS

## 2015-11-01 MED ORDER — TRASTUZUMAB CHEMO 150 MG IV SOLR
8.0000 mg/kg | Freq: Once | INTRAVENOUS | Status: AC
  Administered 2015-11-01: 840 mg via INTRAVENOUS
  Filled 2015-11-01: qty 40

## 2015-11-01 MED ORDER — MEPERIDINE HCL 25 MG/ML IJ SOLN
12.5000 mg | Freq: Once | INTRAMUSCULAR | Status: AC
  Administered 2015-11-01: 12.5 mg via INTRAVENOUS

## 2015-11-01 MED ORDER — SODIUM CHLORIDE 0.9 % IV SOLN: 8.0000 mg/kg | Freq: Once | INTRAVENOUS | Status: DC

## 2015-11-01 MED ORDER — LORAZEPAM 2 MG/ML IJ SOLN
1.0000 mg | Freq: Once | INTRAMUSCULAR | Status: AC
  Administered 2015-11-01: 1 mg via INTRAVENOUS

## 2015-11-01 MED ORDER — FAMOTIDINE IN NACL 20-0.9 MG/50ML-% IV SOLN
20.0000 mg | Freq: Once | INTRAVENOUS | Status: DC
Start: 2015-11-01 — End: 2015-11-01

## 2015-11-01 NOTE — Telephone Encounter (Signed)
appt made and avs printed °

## 2015-11-01 NOTE — Progress Notes (Signed)
Complained of being cold and rigors noted at 1413, Herceptin stopped. Iv normal saline started. Selena Lesser, NP called and reported. Administered 12.5 mg Demerol Iv at 1420. At 1422 no further complaints of being cold or rigors noted. At [place], NP to bedside to see, instructed to stop Herceptin with approximately 20 ml left in bag, and to start Perjeta.  At 1501 Perjeta stopped, patient vomited approxmately 100 ml emesis. Selena Lesser, NP called and reported vomiting. Instructed to give solumedrol 125 mg, benedryl 25 mg and Pepcid 20 mg Iv. Vomited approximately 100 ml of emesis again at 1528. At [facility], NP to see, no further complaints of nausea at this time. Okay to restart Perjeta in 10 minutes.  At 1607 vomited small amount, Perjeta stopped. Selena Lesser at side. Instructed to give ativan .5 mg Iv and zofran 8 mg iv.  At 1615 complaining of lip inflammation, reported to Selena Lesser, NP.

## 2015-11-01 NOTE — Progress Notes (Signed)
1715- Pt nauea/ vomiting was better after zofran iv. Symptom management (C.Bacon, NP) spoke with Dr. Lindi Adie and pt to receive an additional 1mg  iv ativan and 0.25mg  iv aloxi prior to discharge (per MD orders). Will restart perjeta and taxol tomorrow at 8am. Pt received a total of 45ml's of Perjeta today and will get the rest tomorrow. Pt daughter at chairside and understands plan of care. Pt states that her lips stopped tingling. VSS upon discharge. Pt assisted on a wc to her car after treatment.

## 2015-11-01 NOTE — Assessment & Plan Note (Signed)
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%; HER-2 negative ratio 1.04; 10:00: IDC grade 2-3 dissimilar to 1:00 biopsy 10:00: ER 95%, PR 0%, K67: 25%, HER-2 positive ratio 6.42 right axillary lymph node positive for metastatic cancer (LN is Her 2 neg) --------------------------------------------------------------------------------- Left breast LIQ 08/20/2015: 4.2 cm, 4 mm oval mass 12:00, 1.2 cm mass left UOQ, enlarged left axillary lymph node, T2 N1 stage II B clinical stage  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 CT CAP and Bone scan 08/27/15: Bil Breast masses, Bil Axill LN, Bil lower lobe lung nodules indeterminate 7 mm  --------------------------------------------------------------------------------------------------------------------------------------- Treatment plan: 1. Neoadjuvant chemotherapy with either AC followed by THP (followed by CT chest to evaluate the lung nodules and repeat breast MRI) 2. Followed by bilateral mastectomies and lymph node surgery 3. Followed by adjuvant radiation 4. Followed by adjuvant antiestrogen therapy ------------------------------------------------------------------------------------------------------------------------------------ Current treatment: weekly Taxol and every three-week Herceptin and Perjeta; today's cycle 1 day 1 Taxol  Echocardiogram 08/24/2015: EF 60-65%  Chemotherapy toxicities: 1. Mild nausea 2. Mild fatigue and headache 3. Mouth sores: Given a prescription for Magic mouthwash Return to clinic in 1 weeks for cycle 2/12 THP

## 2015-11-01 NOTE — Progress Notes (Signed)
Patient Care Team: No Pcp Per Patient as PCP - General (General Practice)  SUMMARY OF ONCOLOGIC HISTORY:   Bilateral breast cancer ([place])   08/16/2015 Mammogram Left breast LIQ 4.2 cm, 4 mm oval mass 12:00, 1.2 cm mass left UOQ, enlarged left axillary lymph node   08/16/2015 Mammogram Right breast UIQ: 3.9 cm irregular mass, 1.5 cm mass in the right LOQ, 9 mm mass right UOQ, 6 mm mass right LIQ, enlarged lymph node Rt axilla   08/20/2015 Initial Diagnosis Right breast biopsy 1:00: IDC grade 2 ER 95%, PR 2%, Ki 67 15%; HER-2 negative ratio 1.04; 10:00: IDC grade 2-3 dissimilar to 1:00 biopsy 10:00: ER 95%, PR 0%, K67: 25%, HER-2 positive ratio 6.42 right axillary lymph node positive for metastatic cancer   08/20/2015 Procedure Left biopsy 8:00: IDC with DCIS, grade 2; ER 95%, PR 95%, Ki-67 30%, HER-2 negative ratio 1.2; 12:00: IDC with DCIS, grade 1 ER 95%, PR 95%, Ki-67 10% HER-2 negative ratio 1.14 not similar to this biopsy, left axillary lymph node positive,    09/13/2015 -  Neo-Adjuvant Chemotherapy Neoadjuvant dose dense Adriamycin and Cytoxan followed by Taxol Herceptin Perjeta    CHIEF COMPLIANT: Completed 4 cycles of dose dense Adriamycin and Cytoxan, today is cycle 1 Taxol Herceptin Perjeta  INTERVAL HISTORY: Felicia [name] is a 55 year old with above-mentioned history of left breast cancer currently on neoadjuvant chemotherapy for cycle 1 of Taxol Herceptin Perjeta. She had done fairly well from the initial 4 cycles. She does have fatigue, decreased taste and appetite. Denies any nausea or vomiting. She also had complete hair loss.  REVIEW OF SYSTEMS:   Constitutional: Denies fevers, chills or abnormal weight loss Eyes: Denies blurriness of vision Ears, nose, mouth, throat, and face: Denies mucositis or sore throat Respiratory: Denies cough, dyspnea or wheezes Cardiovascular: Denies palpitation, chest discomfort Gastrointestinal:  Denies nausea, heartburn or change in bowel  habits Skin: Denies abnormal skin rashes Lymphatics: Denies new lymphadenopathy or easy bruising Neurological:Denies numbness, tingling or new weaknesses Behavioral/Psych: Mood is stable, no new changes  Extremities: No lower extremity edema Breast:  denies any pain or lumps or nodules in either breasts All other systems were reviewed with the patient and are negative.  I have reviewed the past medical history, past surgical history, social history and family history with the patient and they are unchanged from previous note.  ALLERGIES:  has No Known Allergies.  MEDICATIONS:  Current Outpatient Prescriptions  Medication Sig Dispense Refill  . HYDROcodone-acetaminophen (NORCO) 10-325 MG tablet Take 1 tablet by mouth every 6 (six) hours as needed. 10 tablet 0  . magic mouthwash w/lidocaine SOLN Take 5 mLs by mouth 3 (three) times daily as needed for mouth pain. 100 mL 0  . naproxen (NAPROSYN) 500 MG tablet Take 500 mg by mouth 2 (two) times daily with a meal.     No current facility-administered medications for this visit.   Facility-Administered Medications Ordered in Other Visits  Medication Dose Route Frequency Provider Last Rate Last Dose  . 0.9 %  sodium chloride infusion   Intravenous Once Nicholas Lose, MD      . diphenhydrAMINE (BENADRYL) injection 25 mg  25 mg Intravenous Once Nicholas Lose, MD      . famotidine (PEPCID) IVPB 20 mg premix  20 mg Intravenous Once Nicholas Lose, MD      . heparin lock flush 100 unit/mL  500 Units Intravenous Once Nicholas Lose, MD      . ondansetron (ZOFRAN) 8 mg  in sodium chloride 0.9 % 50 mL IVPB   Intravenous Once Nicholas Lose, MD      . PACLitaxel (TAXOL) 168 mg in sodium chloride 0.9 % 250 mL chemo infusion (</= 72m/m2)  80 mg/m2 (Treatment Plan Actual) Intravenous Once VNicholas Lose MD      . pertuzumab (PERJETA) 840 mg in sodium chloride 0.9 % 250 mL chemo infusion  840 mg Intravenous Once VNicholas Lose MD      . sodium chloride flush (NS)  0.9 % injection 10 mL  10 mL Intravenous PRN VNicholas Lose MD      . trastuzumab (HERCEPTIN) 840 mg in sodium chloride 0.9 % 250 mL chemo infusion  8 mg/kg (Treatment Plan Actual) Intravenous Once VNicholas Lose MD 193.3 mL/hr at 11/01/15 1252 840 mg at 11/01/15 1252    PHYSICAL EXAMINATION: ECOG PERFORMANCE STATUS: 1 - Symptomatic but completely ambulatory  Filed Vitals:   11/01/15 1034  BP: 132/79  Pulse: 75  Temp: 97.8 F (36.6 C)  Resp: 18   Filed Weights   11/01/15 1034  Weight: 231 lb 11.2 oz (105.098 kg)    GENERAL:alert, no distress and comfortable SKIN: skin color, texture, turgor are normal, no rashes or significant lesions EYES: normal, Conjunctiva are pink and non-injected, sclera clear OROPHARYNX:no exudate, no erythema and lips, buccal mucosa, and tongue normal  NECK: supple, thyroid normal size, non-tender, without nodularity LYMPH:  no palpable lymphadenopathy in the cervical, axillary or inguinal LUNGS: clear to auscultation and percussion with normal breathing effort HEART: regular rate & rhythm and no murmurs and no lower extremity edema ABDOMEN:abdomen soft, non-tender and normal bowel sounds MUSCULOSKELETAL:no cyanosis of digits and no clubbing  NEURO: alert & oriented x 3 with fluent speech, no focal motor/sensory deficits EXTREMITIES: No lower extremity edema  LABORATORY DATA:  I have reviewed the data as listed   Chemistry      Component Value Date/Time   NA 139 11/01/2015 1018   K 3.6 11/01/2015 1018   CO2 22 11/01/2015 1018   BUN 10.1 11/01/2015 1018   CREATININE 0.7 11/01/2015 1018      Component Value Date/Time   CALCIUM 9.6 11/01/2015 1018   ALKPHOS 96 11/01/2015 1018   AST 21 11/01/2015 1018   ALT 26 11/01/2015 1018   BILITOT 0.40 11/01/2015 1018       Lab Results  Component Value Date   WBC 11.3* 11/01/2015   HGB 12.7 11/01/2015   HCT 37.3 11/01/2015   MCV 86.9 11/01/2015   PLT 141* 11/01/2015   NEUTROABS 8.8* 11/01/2015    ASSESSMENT & PLAN:  Bilateral breast cancer ([facility]) 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%; HER-2 negative ratio 1.04; 10:00: IDC grade 2-3 dissimilar to 1:00 biopsy 10:00: ER 95%, PR 0%, K67: 25%, HER-2 positive ratio 6.42 right axillary lymph node positive for metastatic cancer (LN is Her 2 neg) --------------------------------------------------------------------------------- Left breast LIQ 08/20/2015: 4.2 cm, 4 mm oval mass 12:00, 1.2 cm mass left UOQ, enlarged left axillary lymph node, T2 N1 stage II B clinical stage  Left biopsy 8:00: IDC with DCIS, grade 2; ER 95%, PR 95%, Ki-67 30%, HER-2 negative ratio 1.2; 12:00: IDC with DCIS, grade 1 ER 95%, PR 95%, Ki-67 10% HER-2 negative ratio  1.14 not similar to this biopsy, left axillary lymph node positive. CT CAP and Bone scan 08/27/15: Bil Breast masses, Bil Axill LN, Bil lower lobe lung nodules indeterminate 7 mm  --------------------------------------------------------------------------------------------------------------------------------------- Treatment plan: 1. Neoadjuvant chemotherapy with either AC followed by THP (followed by CT chest to evaluate the lung nodules and repeat breast MRI) 2. Followed by bilateral mastectomies and lymph node surgery 3. Followed by adjuvant radiation 4. Followed by adjuvant antiestrogen therapy ------------------------------------------------------------------------------------------------------------------------------------ Current treatment: weekly Taxol and every three-week Herceptin and Perjeta; today's cycle 1 day 1 Taxol  Echocardiogram 08/24/2015: EF 60-65%  Chemotherapy toxicities: 1. Mild nausea 2. Mild fatigue and headache 3. Mouth sores: Given a prescription for Magic  mouthwash Return to clinic in 1 weeks for cycle 2/12 THP    Orders Placed This Encounter  Procedures  . CBC with Differential    Standing Status: Standing     Number of Occurrences: 20     Standing Expiration Date: 11/01/2016  . Comprehensive metabolic panel    Standing Status: Standing     Number of Occurrences: 20     Standing Expiration Date: 11/01/2016   The patient has a good understanding of the overall plan. she agrees with it. she will call with any problems that may develop before the next visit here.   Rulon Eisenmenger, MD 11/01/2015

## 2015-11-01 NOTE — Patient Instructions (Addendum)
[facility] Discharge Instructions for Patients Receiving Chemotherapy  Today you received the following chemotherapy agents: Herceptin and Perjeta.  To help prevent nausea and vomiting after your treatment, we encourage you to take your nausea medication as presecribed by MD. DO NOT take Zofran for 3 days, starting today. You may use Compazine and/or Ativan if having a problem with nausea.  If you develop nausea and vomiting that is not controlled by your nausea medication, call the clinic.   BELOW ARE SYMPTOMS THAT SHOULD BE REPORTED IMMEDIATELY:  *FEVER GREATER THAN 100.5 F  *CHILLS WITH OR WITHOUT FEVER  NAUSEA AND VOMITING THAT IS NOT CONTROLLED WITH YOUR NAUSEA MEDICATION  *UNUSUAL SHORTNESS OF BREATH  *UNUSUAL BRUISING OR BLEEDING  TENDERNESS IN MOUTH AND THROAT WITH OR WITHOUT PRESENCE OF ULCERS  *URINARY PROBLEMS  *BOWEL PROBLEMS  UNUSUAL RASH Items with * indicate a potential emergency and should be followed up as soon as possible.  Feel free to call the clinic you have any questions or concerns. The clinic phone number is [phone].  Please show the [facility] at check-in to the Emergency Department and triage nurse.   Pertuzumab injection What is this medicine? PERTUZUMAB (per TOOZ ue mab) is a monoclonal antibody. It is used to treat breast cancer. This medicine may be used for other purposes; ask your health care provider or pharmacist if you have questions. What should I tell my health care provider before I take this medicine? They need to know if you have any of these conditions: -heart disease -heart failure -high blood pressure -history of irregular heart beat -recent or ongoing radiation therapy -an unusual or allergic reaction to pertuzumab, other medicines, foods, dyes, or preservatives -pregnant or trying to get pregnant -breast-feeding How should I use this medicine? This medicine is for infusion into a vein. It is  given by a health care professional in a hospital or clinic setting. Talk to your pediatrician regarding the use of this medicine in children. Special care may be needed. Overdosage: If you think you have taken too much of this medicine contact a poison control center or emergency room at once. NOTE: This medicine is only for you. Do not share this medicine with others. What if I miss a dose? It is important not to miss your dose. Call your doctor or health care professional if you are unable to keep an appointment. What may interact with this medicine? Interactions are not expected. Give your health care provider a list of all the medicines, herbs, non-prescription drugs, or dietary supplements you use. Also tell them if you smoke, drink alcohol, or use illegal drugs. Some items may interact with your medicine. This list may not describe all possible interactions. Give your health care provider a list of all the medicines, herbs, non-prescription drugs, or dietary supplements you use. Also tell them if you smoke, drink alcohol, or use illegal drugs. Some items may interact with your medicine. What should I watch for while using this medicine? Your condition will be monitored carefully while you are receiving this medicine. Report any side effects. Continue your course of treatment even though you feel ill unless your doctor tells you to stop. Do not become pregnant while taking this medicine or for 7 months after stopping it. Women should inform their doctor if they wish to become pregnant or think they might be pregnant. Women of child-bearing potential will need to have a negative pregnancy test before starting this medicine. There is  a potential for serious side effects to an unborn child. Talk to your health care professional or pharmacist for more information. Do not breast-feed an infant while taking this medicine or for 7 months after stopping it. Women must use effective birth control with this  medicine. Call your doctor or health care professional for advice if you get a fever, chills or sore throat, or other symptoms of a cold or flu. Do not treat yourself. Try to avoid being around people who are sick. You may experience fever, chills, and headache during the infusion. Report any side effects during the infusion to your health care professional. What side effects may I notice from receiving this medicine? Side effects that you should report to your doctor or health care professional as soon as possible: -breathing problems -chest pain or palpitations -dizziness -feeling faint or lightheaded -fever or chills -skin rash, itching or hives -sore throat -swelling of the face, lips, or tongue -swelling of the legs or ankles -unusually weak or tired Side effects that usually do not require medical attention (Report these to your doctor or health care professional if they continue or are bothersome.): -diarrhea -hair loss -nausea, vomiting -tiredness This list may not describe all possible side effects. Call your doctor for medical advice about side effects. You may report side effects to FDA at 1-800-FDA-1088. Where should I keep my medicine? This drug is given in a hospital or clinic and will not be stored at home. NOTE: This sheet is a summary. It may not cover all possible information. If you have questions about this medicine, talk to your doctor, pharmacist, or health care provider.    2016, [employer] Standard. (2014-07-21 16:07:57)  Trastuzumab injection for infusion What is this medicine? TRASTUZUMAB (tras TOO zoo mab) is a monoclonal antibody. It is used to treat breast cancer and stomach cancer. This medicine may be used for other purposes; ask your health care provider or pharmacist if you have questions. What should I tell my health care provider before I take this medicine? They need to know if you have any of these conditions: -heart disease -heart  failure -infection (especially a virus infection such as chickenpox, cold sores, or herpes) -lung or breathing disease, like asthma -recent or ongoing radiation therapy -an unusual or allergic reaction to trastuzumab, benzyl alcohol, or other medications, foods, dyes, or preservatives -pregnant or trying to get pregnant -breast-feeding How should I use this medicine? This drug is given as an infusion into a vein. It is administered in a hospital or clinic by a specially trained health care professional. Talk to your pediatrician regarding the use of this medicine in children. This medicine is not approved for use in children. Overdosage: If you think you have taken too much of this medicine contact a poison control center or emergency room at once. NOTE: This medicine is only for you. Do not share this medicine with others. What if I miss a dose? It is important not to miss a dose. Call your doctor or health care professional if you are unable to keep an appointment. What may interact with this medicine? -doxorubicin -warfarin This list may not describe all possible interactions. Give your health care provider a list of all the medicines, herbs, non-prescription drugs, or dietary supplements you use. Also tell them if you smoke, drink alcohol, or use illegal drugs. Some items may interact with your medicine. What should I watch for while using this medicine? Visit your doctor for checks on your progress. Report  any side effects. Continue your course of treatment even though you feel ill unless your doctor tells you to stop. Call your doctor or health care professional for advice if you get a fever, chills or sore throat, or other symptoms of a cold or flu. Do not treat yourself. Try to avoid being around people who are sick. You may experience fever, chills and shaking during your first infusion. These effects are usually mild and can be treated with other medicines. Report any side effects  during the infusion to your health care professional. Fever and chills usually do not happen with later infusions. Do not become pregnant while taking this medicine or for 7 months after stopping it. Women should inform their doctor if they wish to become pregnant or think they might be pregnant. Women of child-bearing potential will need to have a negative pregnancy test before starting this medicine. There is a potential for serious side effects to an unborn child. Talk to your health care professional or pharmacist for more information. Do not breast-feed an infant while taking this medicine or for 7 months after stopping it. Women must use effective birth control with this medicine. What side effects may I notice from receiving this medicine? Side effects that you should report to your doctor or other health care professional as soon as possible: -breathing difficulties -chest pain or palpitations -cough -dizziness or fainting -fever or chills, sore throat -skin rash, itching or hives -swelling of the legs or ankles -unusually weak or tired Side effects that usually do not require medical attention (report to your doctor or other health care professional if they continue or are bothersome): -loss of appetite -headache -muscle aches -nausea This list may not describe all possible side effects. Call your doctor for medical advice about side effects. You may report side effects to FDA at 1-800-FDA-1088. Where should I keep my medicine? This drug is given in a hospital or clinic and will not be stored at home. NOTE: This sheet is a summary. It may not cover all possible information. If you have questions about this medicine, talk to your doctor, pharmacist, or health care provider.    2016, [employer]/Gold Standard. (2014-07-21 11:49:32)

## 2015-11-02 ENCOUNTER — Other Ambulatory Visit: Payer: Self-pay | Admitting: Hematology & Oncology

## 2015-11-02 ENCOUNTER — Ambulatory Visit (HOSPITAL_BASED_OUTPATIENT_CLINIC_OR_DEPARTMENT_OTHER): Payer: Self-pay

## 2015-11-02 ENCOUNTER — Telehealth: Payer: Self-pay | Admitting: *Deleted

## 2015-11-02 ENCOUNTER — Other Ambulatory Visit: Payer: Self-pay | Admitting: Hematology and Oncology

## 2015-11-02 ENCOUNTER — Encounter: Payer: Self-pay | Admitting: Hematology and Oncology

## 2015-11-02 VITALS — BP 131/80 | HR 71 | Temp 98.1°F | Resp 20

## 2015-11-02 DIAGNOSIS — C773 Secondary and unspecified malignant neoplasm of axilla and upper limb lymph nodes: Secondary | ICD-10-CM

## 2015-11-02 DIAGNOSIS — C50211 Malignant neoplasm of upper-inner quadrant of right female breast: Secondary | ICD-10-CM

## 2015-11-02 DIAGNOSIS — Z5111 Encounter for antineoplastic chemotherapy: Secondary | ICD-10-CM

## 2015-11-02 DIAGNOSIS — C50312 Malignant neoplasm of lower-inner quadrant of left female breast: Secondary | ICD-10-CM

## 2015-11-02 DIAGNOSIS — C50812 Malignant neoplasm of overlapping sites of left female breast: Secondary | ICD-10-CM

## 2015-11-02 DIAGNOSIS — Z5112 Encounter for antineoplastic immunotherapy: Secondary | ICD-10-CM

## 2015-11-02 DIAGNOSIS — C50411 Malignant neoplasm of upper-outer quadrant of right female breast: Secondary | ICD-10-CM

## 2015-11-02 DIAGNOSIS — C50811 Malignant neoplasm of overlapping sites of right female breast: Secondary | ICD-10-CM

## 2015-11-02 MED ORDER — ACETAMINOPHEN 325 MG PO TABS
650.0000 mg | ORAL_TABLET | Freq: Once | ORAL | Status: AC
  Administered 2015-11-02: 650 mg via ORAL

## 2015-11-02 MED ORDER — PROCHLORPERAZINE EDISYLATE 5 MG/ML IJ SOLN
10.0000 mg | Freq: Once | INTRAMUSCULAR | Status: AC
  Administered 2015-11-02: 10 mg via INTRAVENOUS

## 2015-11-02 MED ORDER — METHYLPREDNISOLONE SODIUM SUCC 125 MG IJ SOLR
60.0000 mg | Freq: Once | INTRAMUSCULAR | Status: AC
  Administered 2015-11-02: 60 mg via INTRAVENOUS

## 2015-11-02 MED ORDER — DIPHENHYDRAMINE HCL 50 MG/ML IJ SOLN
INTRAMUSCULAR | Status: AC
  Filled 2015-11-02: qty 1

## 2015-11-02 MED ORDER — PROCHLORPERAZINE EDISYLATE 5 MG/ML IJ SOLN
INTRAMUSCULAR | Status: AC
  Filled 2015-11-02: qty 2

## 2015-11-02 MED ORDER — HEPARIN SOD (PORK) LOCK FLUSH 100 UNIT/ML IV SOLN
500.0000 [IU] | Freq: Once | INTRAVENOUS | Status: AC | PRN
  Administered 2015-11-02: 500 [IU]
  Filled 2015-11-02: qty 5

## 2015-11-02 MED ORDER — METHYLPREDNISOLONE SODIUM SUCC 125 MG IJ SOLR
INTRAMUSCULAR | Status: AC
  Filled 2015-11-02: qty 2

## 2015-11-02 MED ORDER — DIPHENHYDRAMINE HCL 50 MG/ML IJ SOLN
25.0000 mg | Freq: Once | INTRAMUSCULAR | Status: AC
  Administered 2015-11-02: 25 mg via INTRAVENOUS

## 2015-11-02 MED ORDER — ACETAMINOPHEN 325 MG PO TABS
ORAL_TABLET | ORAL | Status: AC
  Filled 2015-11-02: qty 2

## 2015-11-02 MED ORDER — SODIUM CHLORIDE 0.9 % IV SOLN
Freq: Once | INTRAVENOUS | Status: AC
  Administered 2015-11-02: 08:00:00 via INTRAVENOUS

## 2015-11-02 MED ORDER — FAMOTIDINE IN NACL 20-0.9 MG/50ML-% IV SOLN
INTRAVENOUS | Status: AC
  Filled 2015-11-02: qty 50

## 2015-11-02 MED ORDER — PACLITAXEL CHEMO INJECTION 300 MG/50ML
80.0000 mg/m2 | Freq: Once | INTRAVENOUS | Status: AC
  Administered 2015-11-02: 168 mg via INTRAVENOUS
  Filled 2015-11-02: qty 28

## 2015-11-02 MED ORDER — SODIUM CHLORIDE 0.9% FLUSH
10.0000 mL | INTRAVENOUS | Status: DC | PRN
  Administered 2015-11-02: 10 mL
  Filled 2015-11-02: qty 10

## 2015-11-02 MED ORDER — SODIUM CHLORIDE 0.9 % IV SOLN
690.0000 mg | Freq: Once | INTRAVENOUS | Status: AC
  Administered 2015-11-02: 690 mg via INTRAVENOUS
  Filled 2015-11-02: qty 23

## 2015-11-02 MED ORDER — LORAZEPAM 2 MG/ML IJ SOLN
INTRAMUSCULAR | Status: AC
  Filled 2015-11-02: qty 1

## 2015-11-02 MED ORDER — LORAZEPAM 2 MG/ML IJ SOLN
0.5000 mg | Freq: Once | INTRAMUSCULAR | Status: AC
  Administered 2015-11-02: 0.5 mg via INTRAVENOUS

## 2015-11-02 MED ORDER — FAMOTIDINE IN NACL 20-0.9 MG/50ML-% IV SOLN
20.0000 mg | Freq: Once | INTRAVENOUS | Status: AC
  Administered 2015-11-02: 20 mg via INTRAVENOUS

## 2015-11-02 NOTE — Patient Instructions (Signed)
[facility] Discharge Instructions for Patients Receiving Chemotherapy  Today you received the following chemotherapy agents Perjeta & Taxol  To help prevent nausea and vomiting after your treatment, we encourage you to take your nausea medication as directed.    If you develop nausea and vomiting that is not controlled by your nausea medication, call the clinic.   BELOW ARE SYMPTOMS THAT SHOULD BE REPORTED IMMEDIATELY:  *FEVER GREATER THAN 100.5 F  *CHILLS WITH OR WITHOUT FEVER  NAUSEA AND VOMITING THAT IS NOT CONTROLLED WITH YOUR NAUSEA MEDICATION  *UNUSUAL SHORTNESS OF BREATH  *UNUSUAL BRUISING OR BLEEDING  TENDERNESS IN MOUTH AND THROAT WITH OR WITHOUT PRESENCE OF ULCERS  *URINARY PROBLEMS  *BOWEL PROBLEMS  UNUSUAL RASH Items with * indicate a potential emergency and should be followed up as soon as possible.  Feel free to call the clinic you have any questions or concerns. The clinic phone number is [phone].  Please show the [place] at check-in to the Emergency Department and triage nurse.  Pertuzumab injection Qu es este medicamento? El PERTUZUMAB es un anticuerpo monoclonal que acta sobre una protena llamada HER2. HER2 se encuentra en algunos cnceres de mama. Este medicamento puede detener el crecimiento de las clulas cancerosas. Este [employer] se puede usar con otros tratamientos para Science writer. Este medicamento puede ser utilizado para otros usos; si tiene alguna pregunta consulte con su proveedor de atencin mdica o con su farmacutico. Qu le debo informar a mi profesional de la salud antes de tomar este medicamento? Necesita saber si usted presenta alguno de los siguientes problemas o situaciones: enfermedad cardiaca insuficiencia cardiaca alta presin sangunea antecedentes de pulso cardiaca irregular radioterapia reciente o continuada una reaccin alrgica o inusual al pertuzumab, a otros medicamentos, alimentos, colorantes  o conservantes si est embarazada o buscando quedar embarazada si est amamantando a un beb Cmo debo utilizar este medicamento? Este medicamento se administra mediante infusin por va intravenosa. Lo administra un profesional de Technical sales engineer en un hospital o en un entorno clnico. Hable con su pediatra para informarse acerca del uso de este medicamento en nios. Puede requerir atencin especial. Sobredosis: Pngase en contacto inmediatamente con un centro toxicolgico o una sala de urgencia si usted cree que haya tomado demasiado medicamento. ATENCIN: [employer] es solo para usted. No comparta este medicamento con nadie. Qu sucede si me olvido de una dosis? Es importante no olvidar ninguna dosis. Informe a su mdico o a su profesional de la salud si no puede asistir a Photographer. Qu puede interactuar con este medicamento? No se esperan interacciones. Puede ser que esta lista no menciona todas las posibles interacciones. Informe a su profesional de [employer]	[place] de [employer] productos a base de hierbas, medicamentos de [place] o suplementos nutritivos que est tomando. Si usted fuma, consume bebidas alcohlicas o si utiliza drogas ilegales, indqueselo tambin a su profesional de [employer]	[place]. Algunas sustancias pueden interactuar con su medicamento. A qu debo estar atento al usar [employer]? Se supervisar su estado de salud atentamente mientras reciba este medicamento. Si presenta algn efecto secundario, infrmelo. Sin embargo, contine con el tratamiento aun si se siente enfermo, a menos que su mdico le indique que lo suspenda. No debe quedar embarazada mientras est tomando este medicamento o por 7 meses despus de dejar de usarlo. Las mujeres deben informar a su mdico si estn buscando quedar embarazadas o si creen que estn embarazadas. Las mujeres con la posibilidad de Best boy nios deben Best boy  una prueba de embarazo negativa antes de empezar a tomar [employer]. Existe la  posibilidad de que ocurran efectos secundarios graves a un beb sin nacer. Para ms informacin hable con su profesional de la salud o su farmacutico. No debe amamantar a un beb mientras est tomando este medicamento o por 7 meses despus de dejar de usarlo. Las [employer] deben usar un mtodo anticonceptivo eficaz con este medicamento. Consulte a su mdico o a su profesional de la salud si tiene fiebre, escalofros, dolor de garganta, o cualquier otro sntoma de resfro o gripe. No se trate usted mismo. Trate de no acercarse a personas que estn enfermas. Es posible que tenga [place], escalofros y dolor de cabeza durante la infusin. Informe cualquier efecto secundario durante la infusin a su profesional de [employer]	[place]. Qu efectos secundarios puedo tener al [employer] este medicamento? Efectos secundarios que debe informar a su mdico o a Barrister's clerk de la salud tan pronto como sea posible: Arboriculturist o palpitaciones mareos sensacin de Youth worker o aturdimiento fiebre o escalofros erupcin cutnea, picazn o urticarias dolor de garganta hinchazn de la cara, labio o lengua hinchazn de las piernas o tobillos cansancio o debilidad inusual Efectos secundarios que, por lo general, no requieren atencin mdica (debe informarlos a su mdico o a su profesional de la salud si persisten o si son molestos): diarrea cada del cabello nuseas, vmito somnolencia Puede ser que esta lista no menciona todos los posibles efectos secundarios. Comunquese a su mdico por asesoramiento mdico [employer]. Usted puede informar los efectos secundarios a la FDA por telfono al 1-800-FDA-1088. Dnde debo guardar mi medicina? Este medicamento se administra en hospitales o clnicas y no necesitar guardarlo en su domicilio. ATENCIN: Este folleto es un resumen. Puede ser que no cubra toda la posible informacin. Si usted tiene preguntas acerca de esta medicina, consulte con su  mdico, su farmacutico o su profesional de Technical sales engineer.    2016, Elsevier/Gold Standard. (2014-09-14 00:00:00)  Paclitaxel injection Qu es este medicamento? El PACLITAXEL es un agente quimioteraputico. Este medicamento acta sobre las clulas que se dividen rpidamente, como las clulas cancerosas, y finalmente provoca la muerte de estas clulas. Se utiliza en el tratamiento del cncer de [place], mama y otros tipos de cncer. Este medicamento puede ser utilizado para otros usos; si tiene alguna pregunta consulte con su proveedor de atencin mdica o con su farmacutico. Qu le debo informar a mi profesional de la salud antes de tomar este medicamento? Necesita saber si usted presenta alguno de los siguientes problemas o situaciones: -trastornos sanguneos -pulso cardiaco irregular -infeccin (especialmente infecciones virales, como varicela o herpes) -enfermedad heptica -radioterapia reciente o continua -una reaccin alrgica o inusual al paclitaxel, alcohol, aceite de ricino polioxietilado, otros agentes quimioteraputicos, medicamentos, alimentos, colorantes o conservantes -si est embarazada o buscando quedar embarazada -si est amamantando a un beb Cmo debo utilizar este medicamento? Este medicamento se administra mediante infusin por va intravenosa. Lo administra un profesional de la salud calificado en un hospital o en un entorno clnico. Hable con su pediatra para informarse acerca del uso de este medicamento en nios. Puede requerir atencin especial. Sobredosis: Pngase en contacto inmediatamente con un centro toxicolgico o una sala de urgencia si usted cree que haya tomado demasiado medicamento. ATENCIN: [employer] es solo para usted. No comparta este medicamento con nadie. Qu sucede si me olvido de una dosis? Es importante no olvidar ninguna dosis. Informe a su mdico o a Barrister's clerk  de la salud si no puede asistir a Photographer. Qu puede interactuar con este  medicamento? No tome esta medicina con ninguno de los siguientes medicamentos: -disulfiram -metronidazol Esta medicina tambin puede interactuar con los siguientes medicamentos: -ciclosporina -diazepam -quetoconazol -medicamentos para incrementar los conteos sanguneos, tales como filgrastim, pegfilgrastim, sargramostim -otros agentes quimioteraputicos, tales como cisplatino, doxorrubicina, epirubicina, etopsido, teniposida, vincristina -quinidina -testosterona -vacunas -verapamilo Consulte a su mdico o a su profesional de la salud antes de tomar cualquiera de los siguientes medicamentos: -acetaminofeno -aspirina -ibuprofeno -quetoprofeno -naproxeno Puede ser que esta lista no menciona todas las posibles interacciones. Informe a su profesional de [employer]	[place] de [employer] productos a base de hierbas, medicamentos de [place] o suplementos nutritivos que est tomando. Si usted fuma, consume bebidas alcohlicas o si utiliza drogas ilegales, indqueselo tambin a su profesional de [employer]	[place]. Algunas sustancias pueden interactuar con su medicamento. A qu debo estar atento al usar Coca[employer]Cola? Se supervisar su estado de salud atentamente mientras reciba este medicamento. Tendr que hacerse anlisis de sangre importantes mientras est tomando este medicamento. Este medicamento puede hacerle sentir un Nurse, mental health. Esto es normal ya que la quimioterapia afecta tanto a las clulas sanas como a las clulas cancerosas. Si presenta alguno de los [employer], infrmelos. Sin embargo, contine con el tratamiento aun si se siente enfermo, a menos que su mdico le indique que lo suspenda. Este medicamento puede causar Chief of Staff graves. Para reducir su riesgo necesitar tomar otro(s) medicamento(s) antes del tratamiento con este medicamento. En algunos casos, podr recibir [employer] para ayudarlo con los efectos secundarios. Siga las instrucciones para  usarlos. Consulte a su mdico o a su profesional de la salud si tiene fiebre, escalofros, dolor de garganta o cualquier otro sntoma de resfro o gripe. No se trate usted mismo. Este medicamento puede reducir la capacidad del cuerpo para combatir infecciones. Trate de no acercarse a personas que estn enfermas. [employer] puede aumentar el riesgo de magulladuras o sangrado. Consulte a su mdico o a su profesional de la salud si observa sangrados inusuales. Proceda con cuidado al cepillar sus dientes, usar hilo dental o Risk manager palillos para los dientes, ya que puede contraer una infeccin o Therapist, art con mayor facilidad. Si se somete a algn tratamiento dental, informe a su dentista que est [employer]. Evite tomar productos que contienen aspirina, acetaminofeno, ibuprofeno, naproxeno o quetoprofeno a menos que as lo indique su mdico. Estos productos pueden disimular la fiebre. No debe quedar embarazada mientras recibe este medicamento. Las mujeres deben informar a su mdico si estn buscando quedar embarazadas o si creen que estn embarazadas. Existe la posibilidad de que ocurran efectos secundarios graves a un beb sin nacer. Para ms informacin hable con su profesional de la salud o su farmacutico. No debe amamantar a un beb mientras est tomando este medicamento. Para los hombres, se desaconseja tener nios mientras reciben Coca[employer]. Qu efectos secundarios puedo tener al [employer] este medicamento? Efectos secundarios que debe informar a su mdico o a Barrister's clerk de la salud tan pronto como sea posible: Chief of Staff, como erupcin cutnea, picazn o urticarias, hinchazn de la cara, los labios o la lengua conteos sanguneos bajos - este frmaco puede reducir la cantidad de glbulos blancos, glbulos rojos y plaquetas. Su riesgo de infeccin y [place]. signos de infeccin - fiebre o escalofros, tos, dolor de garganta, dolor o dificultad para  orinar signos de reduccin de plaquetas o sangrado - magulladuras, puntos rojos en  la piel, heces de color oscuro o con aspecto alquitranado, sangrado por la nariz signos de reduccin de glbulos rojos - cansancio o debilidad inusual, desmayos, sensacin de mareo problemas respiratorios dolor en el pecho alta o baja presin sangunea llagas en la boca nuseas y vmito dolor, hinchazn, enrojecimiento o irritacin en el lugar de la G[place]er, hormigueo, entumecimiento de las manos o los pies pulso cardiaco lento o irregular hinchazn de tobillos, pies, manosEfectos secundarios que, por lo general, no requieren atencin mdica (debe informarlos a su mdico o a su profesional de la salud si persisten o si son molestos): dolor de huesos cada total del cabello, incluyendo el cabello de la cabeza, de las [place], el vello pbico, las cejas y las pestaas cambios en el color de las uas de las manos diarrea aflojamiento de las uas de las manos prdida del apetito dolores musculares o articulares enrojecimiento de la piel sudoracin Puede ser que esta lista no menciona todos los posibles efectos secundarios. Comunquese a su mdico por asesoramiento mdico [employer]. Usted puede informar los efectos secundarios a la FDA por telfono al 1-800-FDA-1088. Dnde debo guardar mi medicina? Este medicamento se administra en hospitales o clnicas y no necesitar guardarlo en su domicilio. ATENCIN: Este folleto es un resumen. Puede ser que no cubra toda la posible informacin. Si usted tiene preguntas acerca de esta medicina, consulte con su mdico, su farmacutico o su profesional de Technical sales engineer.    2016, Elsevier/Gold Standard. (2014-08-25 00:00:00)

## 2015-11-02 NOTE — Telephone Encounter (Signed)
Chemo follow up: Spoke with daughter and translated information to mother. Patient does not have any question/concerns at this time. Informed patient and family member to call if any questions arise. Patient verbalized understanding.

## 2015-11-02 NOTE — Progress Notes (Signed)
sent for poss asst with perjeta and herceptin

## 2015-11-02 NOTE — Telephone Encounter (Signed)
-----   Message from Wilmon Arms, RN sent at 11/02/2015 11:24 AM EDT ----- Regarding: Felicia [name] Followup ContactDX:[id number] First time taxol follow up 11/02/2015 Had reaction on 11/01/2015 to Herceptin & Perjeta

## 2015-11-02 NOTE — Telephone Encounter (Signed)
-----   Message from Flo Shanks, RN sent at 11/01/2015  1:58 PM EDT ----- Regarding: 1st time chemo/ Dr. Lindi Adie First time Taxol, Herceptin and Perjeta.

## 2015-11-02 NOTE — Telephone Encounter (Signed)
See prior chemo follow up note. No questions/concerns at this time.

## 2015-11-05 ENCOUNTER — Encounter: Payer: Self-pay | Admitting: Nurse Practitioner

## 2015-11-05 DIAGNOSIS — T7840XA Allergy, unspecified, initial encounter: Secondary | ICD-10-CM | POA: Insufficient documentation

## 2015-11-05 NOTE — Addendum Note (Signed)
Addended by: Drue Second R on: 11/05/2015 01:59 PM   Modules accepted: Level of Service

## 2015-11-05 NOTE — Assessment & Plan Note (Signed)
Patient presented to the [facility] today to receive her first cycle of Herceptin/Perjeta/Taxol chemotherapy regimen.  Patient had a difficult time with her first cycle of chemotherapy; experiencing several side effects of the chemotherapy.  See further notes for details.  Patient is scheduled to return tomorrow 11/02/2015 to complete her first cycle of chemotherapy.  She will return on 11/08/2015 for labs, visit, and chemotherapy.

## 2015-11-05 NOTE — Assessment & Plan Note (Signed)
Patient presented to the [facility] today to receive cycle one of her Taxol/Herceptin/Perjeta chemotherapy regimen.  Patient received all but approximately 20 mL of the Herceptin infusion; when she developed rigors.  Patient was given Demerol 12.5 mg IV; which resolved the rigors.   Decision was made to hold the last 20 mL of the Herceptin; and to proceed with the Perjeta fusion.  In the midst of the Perjeta infusion.  Patient developed some nausea and had multiple episodes of vomiting.  Patient was given hypersensitivity medications per protocol; and all symptoms resolved once again.  However, patient didn't ultimately developed some tingling/numbness to her lips and mouth during the Perjeta infusion; and decision was made to hold any further infusions this afternoon.  In total,-patient received Benadryl, Pepcid, Solu-Medrol, and Demerol per protocol.  All symptoms completely resolved; and patient was given both verbal and written instructions regarding the use of Benadryl and Pepcid at home.  Also, patient was advised to go directly to the emergency department if there any worsening symptoms whatsoever.  Reviewed all with Dr. Lindi Adie; and decision was made for patient to return in the morning to receive the rest of the Perjeta infusion and the Taxol portion of her chemotherapy as well.  Dr. Lindi Adie is aware of.  Newly added premedications in hopes of avoiding any further chemotherapy reactions.

## 2015-11-05 NOTE — Progress Notes (Addendum)
SYMPTOM MANAGEMENT CLINIC    Chief Complaint: Hypersensitivity reaction  HPI:  Felicia [name] 55 y.o. female diagnosed with bilateral breast cancer.  Presents to the [facility] today to initiate cycle 1 of her Herceptin/Perjeta/Taxol chemotherapy regimen.  Patient presented to the [facility] today to receive cycle one of her Taxol/Herceptin/Perjeta chemotherapy regimen.  Patient received all but approximately 20 mL of the Herceptin infusion; when she developed rigors.  Patient was given Demerol 12.5 mg IV; which resolved the rigors.   Decision was made to hold the last 20 mL of the Herceptin; and to proceed with the Perjeta fusion.  In the midst of the Perjeta infusion.  Patient developed some nausea and had multiple episodes of vomiting.  Patient was given hypersensitivity medications per protocol; and all symptoms resolved once again.  However, patient didn't ultimately developed some tingling/numbness to her lips and mouth during the Perjeta infusion; and decision was made to hold any further infusions this afternoon.  In total,-patient received Benadryl, Pepcid, Solu-Medrol, and Demerol per protocol.  All symptoms completely resolved; and patient was given both verbal and written instructions regarding the use of Benadryl and Pepcid at home.  Also, patient was advised to go directly to the emergency department if there any worsening symptoms whatsoever.  Reviewed all with Dr. Lindi Adie; and decision was made for patient to return in the morning to receive the rest of the Perjeta infusion and the Taxol portion of her chemotherapy as well.  Dr. Lindi Adie is aware of.  Newly added premedications in hopes of avoiding any further chemotherapy reactions.    Bilateral breast cancer ([name]) (Resolved)   08/16/2015 Mammogram Left breast LIQ 4.2 cm, 4 mm oval mass 12:00, 1.2 cm mass left UOQ, enlarged left axillary lymph node   08/16/2015 Mammogram Right breast UIQ: 3.9 cm irregular mass, 1.5 cm mass  in the right LOQ, 9 mm mass right UOQ, 6 mm mass right LIQ, enlarged lymph node Rt axilla   08/20/2015 Initial Diagnosis Right breast biopsy 1:00: IDC grade 2 ER 95%, PR 2%, Ki 67 15%; HER-2 negative ratio 1.04; 10:00: IDC grade 2-3 dissimilar to 1:00 biopsy 10:00: ER 95%, PR 0%, K67: 25%, HER-2 positive ratio 6.42 right axillary lymph node positive for metastatic cancer   08/20/2015 Procedure Left biopsy 8:00: IDC with DCIS, grade 2; ER 95%, PR 95%, Ki-67 30%, HER-2 negative ratio 1.2; 12:00: IDC with DCIS, grade 1 ER 95%, PR 95%, Ki-67 10% HER-2 negative ratio 1.14 not similar to this biopsy, left axillary lymph node positive,    09/13/2015 -  Neo-Adjuvant Chemotherapy Neoadjuvant dose dense Adriamycin and Cytoxan followed by Taxol Herceptin Perjeta    Review of Systems  Gastrointestinal: Positive for nausea and vomiting.  All other systems reviewed and are negative.   Past Medical History  Diagnosis Date  . Diabetes mellitus without complication (HCC)     no medications  . Cancer [facility])     breast    Past Surgical History  Procedure Laterality Date  . Tubal ligation    . Portacath placement N/A 09/04/2015    Procedure: INSERTION PORT-A-CATH WITH [place];  Surgeon: Rolm Bookbinder, MD;  Location: [facility];  Service: General;  Laterality: N/A;    has Bilateral malignant neoplasm of overlapping sites of breast in female [facility]) and Hypersensitivity reaction on her problem list.    has No Known Allergies.    Medication List       This list is accurate as of: 11/01/15 11:59  PM.  Always use your most recent med list.               HYDROcodone-acetaminophen 10-325 MG tablet  Commonly known as:  NORCO  Take 1 tablet by mouth every 6 (six) hours as needed.     magic mouthwash w/lidocaine Soln  Take 5 mLs by mouth 3 (three) times daily as needed for mouth pain.     naproxen 500 MG tablet  Commonly known as:  NAPROSYN  Take 500 mg by mouth 2 (two) times daily with a meal.          PHYSICAL EXAMINATION  Oncology Vitals 11/02/2015 11/02/2015  Temp 98.1 98.4  Pulse 71 65  Resp 20 24  SpO2 99 95   BP Readings from Last 2 Encounters:  11/02/15 131/80  11/01/15 114/69    Physical Exam  Constitutional: She is oriented to person, place, and time and well-developed, well-nourished, and in no distress.  HENT:  Head: Normocephalic and atraumatic.  Mouth/Throat: Oropharynx is clear and moist.  Eyes: Conjunctivae and EOM are normal. Pupils are equal, round, and reactive to light. Right eye exhibits no discharge. Left eye exhibits no discharge. No scleral icterus.  Neck: Normal range of motion. Neck supple. No JVD present. No tracheal deviation present. No thyromegaly present.  Cardiovascular: Normal rate, regular rhythm, normal heart sounds and intact distal pulses.   Pulmonary/Chest: Effort normal and breath sounds normal. No respiratory distress. She has no wheezes. She has no rales. She exhibits no tenderness.  Abdominal: Soft. Bowel sounds are normal. She exhibits no distension and no mass. There is no tenderness. There is no rebound and no guarding.  Musculoskeletal: Normal range of motion. She exhibits no edema or tenderness.  Lymphadenopathy:    She has no cervical adenopathy.  Neurological: She is alert and oriented to person, place, and time. Gait normal.  Skin: Skin is warm and dry. No rash noted. No erythema. No pallor.  Psychiatric: Affect normal.  Nursing note and vitals reviewed.   LABORATORY DATA:. Appointment on 11/01/2015  Component Date Value Ref Range Status  . WBC 11/01/2015 11.3* 3.9 - 10.3 10e3/uL Final  . NEUT# 11/01/2015 8.8* 1.5 - 6.5 10e3/uL Final  . HGB 11/01/2015 12.7  11.6 - 15.9 g/dL Final  . HCT 11/01/2015 37.3  34.8 - 46.6 % Final  . Platelets 11/01/2015 141* 145 - 400 10e3/uL Final  . MCV 11/01/2015 86.9  79.5 - 101.0 fL Final  . MCH 11/01/2015 29.6  25.1 - 34.0 pg Final  . MCHC 11/01/2015 34.0  31.5 - 36.0 g/dL Final  .  RBC 11/01/2015 4.29  3.70 - 5.45 10e6/uL Final  . RDW 11/01/2015 16.2* 11.2 - 14.5 % Final  . lymph# 11/01/2015 1.4  0.9 - 3.3 10e3/uL Final  . MONO# 11/01/2015 1.1* 0.1 - 0.9 10e3/uL Final  . Eosinophils Absolute 11/01/2015 0.0  0.0 - 0.5 10e3/uL Final  . Basophils Absolute 11/01/2015 0.1  0.0 - 0.1 10e3/uL Final  . NEUT% 11/01/2015 78.0* 38.4 - 76.8 % Final  . LYMPH% 11/01/2015 12.1* 14.0 - 49.7 % Final  . MONO% 11/01/2015 9.4  0.0 - 14.0 % Final  . EOS% 11/01/2015 0.1  0.0 - 7.0 % Final  . BASO% 11/01/2015 0.4  0.0 - 2.0 % Final  . Sodium 11/01/2015 139  136 - 145 mEq/L Final  . Potassium 11/01/2015 3.6  3.5 - 5.1 mEq/L Final  . Chloride 11/01/2015 107  98 - 109 mEq/L Final  . CO2  11/01/2015 22  22 - 29 mEq/L Final  . Glucose 11/01/2015 119  70 - 140 mg/dl Final   Glucose reference range is for nonfasting patients. Fasting glucose reference range is 70- 100.  Marland Kitchen BUN 11/01/2015 10.1  7.0 - 26.0 mg/dL Final  . Creatinine 11/01/2015 0.7  0.6 - 1.1 mg/dL Final  . Total Bilirubin 11/01/2015 0.40  0.20 - 1.20 mg/dL Final  . Alkaline Phosphatase 11/01/2015 96  40 - 150 U/L Final  . AST 11/01/2015 21  5 - 34 U/L Final  . ALT 11/01/2015 26  0 - 55 U/L Final  . Total Protein 11/01/2015 7.2  6.4 - 8.3 g/dL Final  . Albumin 11/01/2015 3.7  3.5 - 5.0 g/dL Final  . Calcium 11/01/2015 9.6  8.4 - 10.4 mg/dL Final  . Anion Gap 11/01/2015 10  3 - 11 mEq/L Final  . EGFR 11/01/2015 >90  >90 ml/min/1.73 m2 Final   eGFR is calculated using the CKD-EPI Creatinine Equation (2009)   *Translator present for visit.   RADIOGRAPHIC STUDIES: No results found.  ASSESSMENT/PLAN:    Hypersensitivity reaction Patient presented to the [facility] today to receive cycle one of her Taxol/Herceptin/Perjeta chemotherapy regimen.  Patient received all but approximately 20 mL of the Herceptin infusion; when she developed rigors.  Patient was given Demerol 12.5 mg IV; which resolved the rigors.   Decision was made  to hold the last 20 mL of the Herceptin; and to proceed with the Perjeta fusion.  In the midst of the Perjeta infusion.  Patient developed some nausea and had multiple episodes of vomiting.  Patient was given hypersensitivity medications per protocol; and all symptoms resolved once again.  However, patient didn't ultimately developed some tingling/numbness to her lips and mouth during the Perjeta infusion; and decision was made to hold any further infusions this afternoon.  In total,-patient received Benadryl, Pepcid, Solu-Medrol, and Demerol per protocol.  All symptoms completely resolved; and patient was given both verbal and written instructions regarding the use of Benadryl and Pepcid at home.  Also, patient was advised to go directly to the emergency department if there any worsening symptoms whatsoever.  Reviewed all with Dr. Lindi Adie; and decision was made for patient to return in the morning to receive the rest of the Perjeta infusion and the Taxol portion of her chemotherapy as well.  Dr. Lindi Adie is aware of.  Newly added premedications in hopes of avoiding any further chemotherapy reactions.  Bilateral malignant neoplasm of overlapping sites of breast in female [facility]) Patient presented to the [facility] today to receive her first cycle of Herceptin/Perjeta/Taxol chemotherapy regimen.  Patient had a difficult time with her first cycle of chemotherapy; experiencing several side effects of the chemotherapy.  See further notes for details.  Patient is scheduled to return tomorrow 11/02/2015 to complete her first cycle of chemotherapy.  She will return on 11/08/2015 for labs, visit, and chemotherapy.   Patient stated understanding of all instructions; and was in agreement with this plan of care. The patient knows to call the clinic with any problems, questions or concerns.   Total time spent with patient was 40 minutes;  with greater than 75 percent of that time spent in face to face counseling  regarding patient's symptoms,  and coordination of care and follow up.  Disclaimer:This dictation was prepared with Dragon/digital dictation along with [employer]. Any transcriptional errors that result from this process are unintentional.  Drue Second, NP 11/05/2015

## 2015-11-07 NOTE — Assessment & Plan Note (Signed)
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%; HER-2 negative ratio 1.04; 10:00: IDC grade 2-3 dissimilar to 1:00 biopsy 10:00: ER 95%, PR 0%, K67: 25%, HER-2 positive ratio 6.42 right axillary lymph node positive for metastatic cancer (LN is Her 2 neg) --------------------------------------------------------------------------------- Left breast LIQ 08/20/2015: 4.2 cm, 4 mm oval mass 12:00, 1.2 cm mass left UOQ, enlarged left axillary lymph node, T2 N1 stage II B clinical stage  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 CT CAP and Bone scan 08/27/15: Bil Breast masses, Bil Axill LN, Bil lower lobe lung nodules indeterminate 7 mm  --------------------------------------------------------------------------------------------------------------------------------------- Treatment plan: 1. Neoadjuvant chemotherapy with either AC followed by THP (followed by CT chest to evaluate the lung nodules and repeat breast MRI) 2. Followed by bilateral mastectomies and lymph node surgery 3. Followed by adjuvant radiation 4. Followed by adjuvant antiestrogen therapy ------------------------------------------------------------------------------------------------------------------------------------ Current treatment: weekly Taxol and every three-week Herceptin and Perjeta; today's cycle 1 day 8 Taxol  Echocardiogram 08/24/2015: EF 60-65%  Chemotherapy toxicities: 1. Mild nausea 2. Mild fatigue and headache 3. Mouth sores: Given a prescription for Magic mouthwash Return to clinic in 2 weeks for cycle 4/12 THP

## 2015-11-08 ENCOUNTER — Encounter: Payer: Self-pay | Admitting: Hematology and Oncology

## 2015-11-08 ENCOUNTER — Ambulatory Visit (HOSPITAL_BASED_OUTPATIENT_CLINIC_OR_DEPARTMENT_OTHER): Payer: Self-pay

## 2015-11-08 ENCOUNTER — Other Ambulatory Visit (HOSPITAL_BASED_OUTPATIENT_CLINIC_OR_DEPARTMENT_OTHER): Payer: Self-pay

## 2015-11-08 ENCOUNTER — Ambulatory Visit: Payer: Self-pay

## 2015-11-08 ENCOUNTER — Ambulatory Visit (HOSPITAL_BASED_OUTPATIENT_CLINIC_OR_DEPARTMENT_OTHER): Payer: Self-pay | Admitting: Hematology and Oncology

## 2015-11-08 ENCOUNTER — Encounter: Payer: Self-pay | Admitting: *Deleted

## 2015-11-08 VITALS — BP 121/81 | HR 84 | Temp 98.2°F | Resp 18 | Ht 59.0 in | Wt 229.0 lb

## 2015-11-08 DIAGNOSIS — C50812 Malignant neoplasm of overlapping sites of left female breast: Secondary | ICD-10-CM

## 2015-11-08 DIAGNOSIS — C50811 Malignant neoplasm of overlapping sites of right female breast: Secondary | ICD-10-CM

## 2015-11-08 DIAGNOSIS — Z5111 Encounter for antineoplastic chemotherapy: Secondary | ICD-10-CM

## 2015-11-08 LAB — COMPREHENSIVE METABOLIC PANEL
ALT: 48 U/L (ref 0–55)
ANION GAP: 8 meq/L (ref 3–11)
AST: 28 U/L (ref 5–34)
Albumin: 3.6 g/dL (ref 3.5–5.0)
Alkaline Phosphatase: 69 U/L (ref 40–150)
BUN: 10 mg/dL (ref 7.0–26.0)
CALCIUM: 9.4 mg/dL (ref 8.4–10.4)
CHLORIDE: 107 meq/L (ref 98–109)
CO2: 23 meq/L (ref 22–29)
Creatinine: 0.6 mg/dL (ref 0.6–1.1)
Glucose: 99 mg/dl (ref 70–140)
POTASSIUM: 3.5 meq/L (ref 3.5–5.1)
Sodium: 138 mEq/L (ref 136–145)
Total Bilirubin: 0.56 mg/dL (ref 0.20–1.20)
Total Protein: 7 g/dL (ref 6.4–8.3)

## 2015-11-08 LAB — CBC WITH DIFFERENTIAL/PLATELET
BASO%: 1.1 % (ref 0.0–2.0)
BASOS ABS: 0.1 10*3/uL (ref 0.0–0.1)
EOS%: 0.2 % (ref 0.0–7.0)
Eosinophils Absolute: 0 10*3/uL (ref 0.0–0.5)
HEMATOCRIT: 38.1 % (ref 34.8–46.6)
HGB: 12.7 g/dL (ref 11.6–15.9)
LYMPH#: 1 10*3/uL (ref 0.9–3.3)
LYMPH%: 11.2 % — AB (ref 14.0–49.7)
MCH: 29.1 pg (ref 25.1–34.0)
MCHC: 33.3 g/dL (ref 31.5–36.0)
MCV: 87.4 fL (ref 79.5–101.0)
MONO#: 0.7 10*3/uL (ref 0.1–0.9)
MONO%: 8.2 % (ref 0.0–14.0)
NEUT#: 6.9 10*3/uL — ABNORMAL HIGH (ref 1.5–6.5)
NEUT%: 79.3 % — AB (ref 38.4–76.8)
PLATELETS: 311 10*3/uL (ref 145–400)
RBC: 4.36 10*6/uL (ref 3.70–5.45)
RDW: 16.9 % — ABNORMAL HIGH (ref 11.2–14.5)
WBC: 8.7 10*3/uL (ref 3.9–10.3)

## 2015-11-08 MED ORDER — HEPARIN SOD (PORK) LOCK FLUSH 100 UNIT/ML IV SOLN
500.0000 [IU] | Freq: Once | INTRAVENOUS | Status: AC | PRN
  Administered 2015-11-08: 500 [IU]
  Filled 2015-11-08: qty 5

## 2015-11-08 MED ORDER — SODIUM CHLORIDE 0.9 % IV SOLN
Freq: Once | INTRAVENOUS | Status: AC
  Administered 2015-11-08: 14:00:00 via INTRAVENOUS

## 2015-11-08 MED ORDER — SODIUM CHLORIDE 0.9 % IV SOLN
80.0000 mg/m2 | Freq: Once | INTRAVENOUS | Status: AC
  Administered 2015-11-08: 168 mg via INTRAVENOUS
  Filled 2015-11-08: qty 28

## 2015-11-08 MED ORDER — FAMOTIDINE IN NACL 20-0.9 MG/50ML-% IV SOLN
INTRAVENOUS | Status: AC
  Filled 2015-11-08: qty 50

## 2015-11-08 MED ORDER — FAMOTIDINE IN NACL 20-0.9 MG/50ML-% IV SOLN
20.0000 mg | Freq: Once | INTRAVENOUS | Status: AC
  Administered 2015-11-08: 20 mg via INTRAVENOUS

## 2015-11-08 MED ORDER — SODIUM CHLORIDE 0.9% FLUSH
10.0000 mL | INTRAVENOUS | Status: DC | PRN
  Administered 2015-11-08: 10 mL
  Filled 2015-11-08: qty 10

## 2015-11-08 MED ORDER — DIPHENHYDRAMINE HCL 50 MG/ML IJ SOLN
INTRAMUSCULAR | Status: AC
  Filled 2015-11-08: qty 1

## 2015-11-08 MED ORDER — DIPHENHYDRAMINE HCL 50 MG/ML IJ SOLN
25.0000 mg | Freq: Once | INTRAMUSCULAR | Status: AC
  Administered 2015-11-08: 25 mg via INTRAVENOUS

## 2015-11-08 MED ORDER — SODIUM CHLORIDE 0.9 % IV SOLN
Freq: Once | INTRAVENOUS | Status: AC
  Administered 2015-11-08: 15:00:00 via INTRAVENOUS
  Filled 2015-11-08: qty 4

## 2015-11-08 NOTE — Progress Notes (Signed)
Patient Care Team: No Pcp Per Patient as PCP - General (General Practice)  DIAGNOSIS: No matching staging information was found for the patient.  SUMMARY OF ONCOLOGIC HISTORY:   Bilateral breast cancer ([place]) (Resolved)   08/16/2015 Mammogram Left breast LIQ 4.2 cm, 4 mm oval mass 12:00, 1.2 cm mass left UOQ, enlarged left axillary lymph node   08/16/2015 Mammogram Right breast UIQ: 3.9 cm irregular mass, 1.5 cm mass in the right LOQ, 9 mm mass right UOQ, 6 mm mass right LIQ, enlarged lymph node Rt axilla   08/20/2015 Initial Diagnosis Right breast biopsy 1:00: IDC grade 2 ER 95%, PR 2%, Ki 67 15%; HER-2 negative ratio 1.04; 10:00: IDC grade 2-3 dissimilar to 1:00 biopsy 10:00: ER 95%, PR 0%, K67: 25%, HER-2 positive ratio 6.42 right axillary lymph node positive for metastatic cancer   08/20/2015 Procedure Left biopsy 8:00: IDC with DCIS, grade 2; ER 95%, PR 95%, Ki-67 30%, HER-2 negative ratio 1.2; 12:00: IDC with DCIS, grade 1 ER 95%, PR 95%, Ki-67 10% HER-2 negative ratio 1.14 not similar to this biopsy, left axillary lymph node positive,    09/13/2015 -  Neo-Adjuvant Chemotherapy Neoadjuvant dose dense Adriamycin and Cytoxan followed by Taxol Herceptin Perjeta    CHIEF COMPLIANT: Taxol week 2  INTERVAL HISTORY: Felicia [name] is a 55 year old with above-mentioned history of left breast cancer currently on neoadjuvant chemotherapy. She received Herceptin Perjeta last week along with Taxol. After the Herceptin was given she had profound chills and riders for which she received Demerol. With the Demerol she had profound nausea and vomiting which did not subside. She had to be brought in the next day to finish the rest of the treatment with Perjeta and Taxol. She had done well since then. She also had nosebleeds lasted 2-3 days as well as one episode of hemorrhoidal rectal bleed. She had diarrhea from Perjeta which resolved after 1-2 days. She did not take Imodium as recommended.  REVIEW OF  SYSTEMS:   Constitutional: Denies fevers, chills or abnormal weight loss Eyes: Denies blurriness of vision Ears, nose, mouth, throat, and face: Denies mucositis or sore throat Respiratory: Denies cough, dyspnea or wheezes Cardiovascular: Denies palpitation, chest discomfort Gastrointestinal:  Denies nausea, heartburn or change in bowel habits Skin: Denies abnormal skin rashes Lymphatics: Denies new lymphadenopathy or easy bruising Neurological:Denies numbness, tingling or new weaknesses Behavioral/Psych: Mood is stable, no new changes  Extremities: No lower extremity edema Breast:  denies any pain or lumps or nodules in either breasts All other systems were reviewed with the patient and are negative.  I have reviewed the past medical history, past surgical history, social history and family history with the patient and they are unchanged from previous note.  ALLERGIES:  has No Known Allergies.  MEDICATIONS:  Current Outpatient Prescriptions  Medication Sig Dispense Refill  . dexamethasone (DECADRON) 4 MG tablet Take 4 mg by mouth daily. Start day after chemo.  Take 1 tablet daily for 2 days after chemo.    Marland Kitchen HYDROcodone-acetaminophen (NORCO) 10-325 MG tablet Take 1 tablet by mouth every 6 (six) hours as needed. 10 tablet 0  . lidocaine-prilocaine (EMLA) cream Apply 1 application topically as needed (Apply 2- hours prior to access of port).    . LORazepam (ATIVAN) 0.5 MG tablet Take 0.5 mg by mouth every 6 (six) hours as needed for anxiety.    . magic mouthwash w/lidocaine SOLN Take 5 mLs by mouth 3 (three) times daily as needed for mouth pain. 100 mL  0  . naproxen (NAPROSYN) 500 MG tablet Take 500 mg by mouth 2 (two) times daily with a meal.    . prochlorperazine (COMPAZINE) 10 MG tablet Take 10 mg by mouth every 6 (six) hours as needed for nausea or vomiting.     No current facility-administered medications for this visit.    PHYSICAL EXAMINATION: ECOG PERFORMANCE STATUS: 1 -  Symptomatic but completely ambulatory  Filed Vitals:   11/08/15 1213  BP: 121/81  Pulse: 84  Temp: 98.2 F (36.8 C)  Resp: 18   Filed Weights   11/08/15 1213  Weight: 229 lb (103.874 kg)    GENERAL:alert, no distress and comfortable SKIN: skin color, texture, turgor are normal, no rashes or significant lesions EYES: normal, Conjunctiva are pink and non-injected, sclera clear OROPHARYNX:no exudate, no erythema and lips, buccal mucosa, and tongue normal  NECK: supple, thyroid normal size, non-tender, without nodularity LYMPH:  no palpable lymphadenopathy in the cervical, axillary or inguinal LUNGS: clear to auscultation and percussion with normal breathing effort HEART: regular rate & rhythm and no murmurs and no lower extremity edema ABDOMEN:abdomen soft, non-tender and normal bowel sounds MUSCULOSKELETAL:no cyanosis of digits and no clubbing  NEURO: alert & oriented x 3 with fluent speech, no focal motor/sensory deficits EXTREMITIES: No lower extremity edema  LABORATORY DATA:  I have reviewed the data as listed   Chemistry      Component Value Date/Time   NA 139 11/01/2015 1018   K 3.6 11/01/2015 1018   CO2 22 11/01/2015 1018   BUN 10.1 11/01/2015 1018   CREATININE 0.7 11/01/2015 1018      Component Value Date/Time   CALCIUM 9.6 11/01/2015 1018   ALKPHOS 96 11/01/2015 1018   AST 21 11/01/2015 1018   ALT 26 11/01/2015 1018   BILITOT 0.40 11/01/2015 1018       Lab Results  Component Value Date   WBC 8.7 11/08/2015   HGB 12.7 11/08/2015   HCT 38.1 11/08/2015   MCV 87.4 11/08/2015   PLT 311 11/08/2015   NEUTROABS 6.9* 11/08/2015     ASSESSMENT & PLAN:  Bilateral malignant neoplasm of overlapping sites of breast in female [facility]) 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%,  PR 2%, Ki 67 15%; HER-2 negative ratio 1.04; 10:00: IDC grade 2-3 dissimilar to 1:00 biopsy 10:00: ER 95%, PR 0%, K67: 25%, HER-2 positive ratio 6.42 right axillary lymph node positive for metastatic cancer (LN is Her 2 neg) --------------------------------------------------------------------------------- Left breast LIQ 08/20/2015: 4.2 cm, 4 mm oval mass 12:00, 1.2 cm mass left UOQ, enlarged left axillary lymph node, T2 N1 stage II B clinical stage  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 CT CAP and Bone scan 08/27/15: Bil Breast masses, Bil Axill LN, Bil lower lobe lung nodules indeterminate 7 mm  --------------------------------------------------------------------------------------------------------------------------------------- Treatment plan: 1. Neoadjuvant chemotherapy with either AC followed by THP (followed by CT chest to evaluate the lung nodules and repeat breast MRI) 2. Followed by bilateral mastectomies and lymph node surgery 3. Followed by adjuvant radiation 4. Followed by adjuvant  antiestrogen therapy ------------------------------------------------------------------------------------------------------------------------------------ Current treatment: weekly Taxol and every three-week Herceptin and Perjeta; today's cycle 1 day 8 Taxol  Echocardiogram 08/24/2015: EF 60-65%  Chemotherapy toxicities: 1. Mild nausea 2. Mild fatigue and headache 3. Mouth sores: Given a prescription for Magic mouthwash 4. With the first dose of Herceptin, right worse followed by Demerol which caused nausea and vomiting 5. Diarrhea due to Perjeta resolved after 1-2 days 5. Nosebleeds and rectal bleed related to diarrhea  Return to clinic in 2 weeks for cycle 4/12 THP   No orders of the defined types  were placed in this encounter.   The patient has a good understanding of the overall plan. she agrees with it. she will call with any problems that may develop before the next visit here.   Rulon Eisenmenger, MD 11/08/2015

## 2015-11-08 NOTE — Patient Instructions (Signed)
[facility] Discharge Instructions for Patients Receiving Chemotherapy  Today you received the following chemotherapy agents Taxol   To help prevent nausea and vomiting after your treatment, we encourage you to take your nausea medication as directed.   If you develop nausea and vomiting that is not controlled by your nausea medication, call the clinic.   BELOW ARE SYMPTOMS THAT SHOULD BE REPORTED IMMEDIATELY:  *FEVER GREATER THAN 100.5 F  *CHILLS WITH OR WITHOUT FEVER  NAUSEA AND VOMITING THAT IS NOT CONTROLLED WITH YOUR NAUSEA MEDICATION  *UNUSUAL SHORTNESS OF BREATH  *UNUSUAL BRUISING OR BLEEDING  TENDERNESS IN MOUTH AND THROAT WITH OR WITHOUT PRESENCE OF ULCERS  *URINARY PROBLEMS  *BOWEL PROBLEMS  UNUSUAL RASH Items with * indicate a potential emergency and should be followed up as soon as possible.  Feel free to call the clinic you have any questions or concerns. The clinic phone number is [phone].  Please show the CHEMO ALERT CARD at check-in to the Emergency Department and triage nurse.   

## 2015-11-15 ENCOUNTER — Ambulatory Visit (HOSPITAL_BASED_OUTPATIENT_CLINIC_OR_DEPARTMENT_OTHER): Payer: Self-pay

## 2015-11-15 ENCOUNTER — Other Ambulatory Visit (HOSPITAL_BASED_OUTPATIENT_CLINIC_OR_DEPARTMENT_OTHER): Payer: Self-pay

## 2015-11-15 ENCOUNTER — Encounter: Payer: Self-pay | Admitting: *Deleted

## 2015-11-15 DIAGNOSIS — C50811 Malignant neoplasm of overlapping sites of right female breast: Secondary | ICD-10-CM

## 2015-11-15 DIAGNOSIS — C50812 Malignant neoplasm of overlapping sites of left female breast: Secondary | ICD-10-CM

## 2015-11-15 DIAGNOSIS — Z5111 Encounter for antineoplastic chemotherapy: Secondary | ICD-10-CM

## 2015-11-15 LAB — CBC WITH DIFFERENTIAL/PLATELET
BASO%: 0.9 % (ref 0.0–2.0)
Basophils Absolute: 0 10*3/uL (ref 0.0–0.1)
EOS%: 0.9 % (ref 0.0–7.0)
Eosinophils Absolute: 0 10*3/uL (ref 0.0–0.5)
HEMATOCRIT: 37.7 % (ref 34.8–46.6)
HEMOGLOBIN: 12.4 g/dL (ref 11.6–15.9)
LYMPH#: 0.8 10*3/uL — AB (ref 0.9–3.3)
LYMPH%: 17.4 % (ref 14.0–49.7)
MCH: 29.4 pg (ref 25.1–34.0)
MCHC: 32.9 g/dL (ref 31.5–36.0)
MCV: 89.3 fL (ref 79.5–101.0)
MONO#: 0.5 10*3/uL (ref 0.1–0.9)
MONO%: 9.5 % (ref 0.0–14.0)
NEUT#: 3.4 10*3/uL (ref 1.5–6.5)
NEUT%: 71.3 % (ref 38.4–76.8)
PLATELETS: 263 10*3/uL (ref 145–400)
RBC: 4.22 10*6/uL (ref 3.70–5.45)
RDW: 17.8 % — ABNORMAL HIGH (ref 11.2–14.5)
WBC: 4.8 10*3/uL (ref 3.9–10.3)

## 2015-11-15 LAB — COMPREHENSIVE METABOLIC PANEL
ALBUMIN: 3.5 g/dL (ref 3.5–5.0)
ALK PHOS: 69 U/L (ref 40–150)
ALT: 39 U/L (ref 0–55)
ANION GAP: 9 meq/L (ref 3–11)
AST: 25 U/L (ref 5–34)
BILIRUBIN TOTAL: 0.64 mg/dL (ref 0.20–1.20)
BUN: 7.9 mg/dL (ref 7.0–26.0)
CALCIUM: 9.3 mg/dL (ref 8.4–10.4)
CHLORIDE: 108 meq/L (ref 98–109)
CO2: 23 mEq/L (ref 22–29)
CREATININE: 0.7 mg/dL (ref 0.6–1.1)
EGFR: >90 mL/min/{1.73_m2} (ref >90)
Glucose: 133 mg/dl (ref 70–140)
Potassium: 3.3 mEq/L — ABNORMAL LOW (ref 3.5–5.1)
Sodium: 139 mEq/L (ref 136–145)
TOTAL PROTEIN: 6.8 g/dL (ref 6.4–8.3)

## 2015-11-15 MED ORDER — FAMOTIDINE IN NACL 20-0.9 MG/50ML-% IV SOLN
INTRAVENOUS | Status: AC
  Filled 2015-11-15: qty 50

## 2015-11-15 MED ORDER — DIPHENHYDRAMINE HCL 50 MG/ML IJ SOLN
25.0000 mg | Freq: Once | INTRAMUSCULAR | Status: AC
  Administered 2015-11-15: 25 mg via INTRAVENOUS

## 2015-11-15 MED ORDER — SODIUM CHLORIDE 0.9 % IV SOLN
Freq: Once | INTRAVENOUS | Status: AC
  Administered 2015-11-15: 12:00:00 via INTRAVENOUS

## 2015-11-15 MED ORDER — SODIUM CHLORIDE 0.9 % IV SOLN
Freq: Once | INTRAVENOUS | Status: AC
  Administered 2015-11-15: 13:00:00 via INTRAVENOUS
  Filled 2015-11-15: qty 4

## 2015-11-15 MED ORDER — SODIUM CHLORIDE 0.9% FLUSH
10.0000 mL | INTRAVENOUS | Status: DC | PRN
  Administered 2015-11-15: 10 mL
  Filled 2015-11-15: qty 10

## 2015-11-15 MED ORDER — FAMOTIDINE IN NACL 20-0.9 MG/50ML-% IV SOLN
20.0000 mg | Freq: Once | INTRAVENOUS | Status: AC
  Administered 2015-11-15: 20 mg via INTRAVENOUS

## 2015-11-15 MED ORDER — PACLITAXEL CHEMO INJECTION 300 MG/50ML
80.0000 mg/m2 | Freq: Once | INTRAVENOUS | Status: AC
  Administered 2015-11-15: 168 mg via INTRAVENOUS
  Filled 2015-11-15: qty 28

## 2015-11-15 MED ORDER — DIPHENHYDRAMINE HCL 50 MG/ML IJ SOLN
INTRAMUSCULAR | Status: AC
  Filled 2015-11-15: qty 1

## 2015-11-15 MED ORDER — HEPARIN SOD (PORK) LOCK FLUSH 100 UNIT/ML IV SOLN
500.0000 [IU] | Freq: Once | INTRAVENOUS | Status: AC | PRN
  Administered 2015-11-15: 500 [IU]
  Filled 2015-11-15: qty 5

## 2015-11-15 NOTE — Patient Instructions (Signed)
[facility] Discharge Instructions for Patients Receiving Chemotherapy  Today you received the following chemotherapy agents: Taxol.  To help prevent nausea and vomiting after your treatment, we encourage you to take your nausea medication : Compazine 10 mg every 6 hours as needed.   If you develop nausea and vomiting that is not controlled by your nausea medication, call the clinic.   BELOW ARE SYMPTOMS THAT SHOULD BE REPORTED IMMEDIATELY:  *FEVER GREATER THAN 100.5 F  *CHILLS WITH OR WITHOUT FEVER  NAUSEA AND VOMITING THAT IS NOT CONTROLLED WITH YOUR NAUSEA MEDICATION  *UNUSUAL SHORTNESS OF BREATH  *UNUSUAL BRUISING OR BLEEDING  TENDERNESS IN MOUTH AND THROAT WITH OR WITHOUT PRESENCE OF ULCERS  *URINARY PROBLEMS  *BOWEL PROBLEMS  UNUSUAL RASH Items with * indicate a potential emergency and should be followed up as soon as possible.  Feel free to call the clinic you have any questions or concerns. The clinic phone number is [phone].  Please show the CHEMO ALERT CARD at check-in to the Emergency Department and triage nurse.   

## 2015-11-20 ENCOUNTER — Encounter: Payer: Self-pay | Admitting: Hematology and Oncology

## 2015-11-20 NOTE — Progress Notes (Signed)
Per [employer] perjecta was approved case [id number] 11/05/15

## 2015-11-22 ENCOUNTER — Encounter: Payer: Self-pay | Admitting: Hematology and Oncology

## 2015-11-22 ENCOUNTER — Encounter: Payer: Self-pay | Admitting: *Deleted

## 2015-11-22 ENCOUNTER — Other Ambulatory Visit: Payer: Self-pay

## 2015-11-22 ENCOUNTER — Ambulatory Visit (HOSPITAL_BASED_OUTPATIENT_CLINIC_OR_DEPARTMENT_OTHER): Payer: Self-pay | Admitting: Hematology and Oncology

## 2015-11-22 ENCOUNTER — Telehealth: Payer: Self-pay | Admitting: Hematology and Oncology

## 2015-11-22 ENCOUNTER — Ambulatory Visit (HOSPITAL_BASED_OUTPATIENT_CLINIC_OR_DEPARTMENT_OTHER): Payer: Self-pay

## 2015-11-22 ENCOUNTER — Other Ambulatory Visit (HOSPITAL_BASED_OUTPATIENT_CLINIC_OR_DEPARTMENT_OTHER): Payer: Self-pay

## 2015-11-22 DIAGNOSIS — R51 Headache: Secondary | ICD-10-CM

## 2015-11-22 DIAGNOSIS — C50211 Malignant neoplasm of upper-inner quadrant of right female breast: Secondary | ICD-10-CM

## 2015-11-22 DIAGNOSIS — K625 Hemorrhage of anus and rectum: Secondary | ICD-10-CM

## 2015-11-22 DIAGNOSIS — C50411 Malignant neoplasm of upper-outer quadrant of right female breast: Secondary | ICD-10-CM

## 2015-11-22 DIAGNOSIS — C773 Secondary and unspecified malignant neoplasm of axilla and upper limb lymph nodes: Secondary | ICD-10-CM

## 2015-11-22 DIAGNOSIS — R197 Diarrhea, unspecified: Secondary | ICD-10-CM

## 2015-11-22 DIAGNOSIS — C50811 Malignant neoplasm of overlapping sites of right female breast: Secondary | ICD-10-CM

## 2015-11-22 DIAGNOSIS — C50312 Malignant neoplasm of lower-inner quadrant of left female breast: Secondary | ICD-10-CM

## 2015-11-22 DIAGNOSIS — C50812 Malignant neoplasm of overlapping sites of left female breast: Principal | ICD-10-CM

## 2015-11-22 DIAGNOSIS — Z5111 Encounter for antineoplastic chemotherapy: Secondary | ICD-10-CM

## 2015-11-22 DIAGNOSIS — K1370 Unspecified lesions of oral mucosa: Secondary | ICD-10-CM

## 2015-11-22 DIAGNOSIS — Z5112 Encounter for antineoplastic immunotherapy: Secondary | ICD-10-CM

## 2015-11-22 DIAGNOSIS — R110 Nausea: Secondary | ICD-10-CM

## 2015-11-22 DIAGNOSIS — R040 Epistaxis: Secondary | ICD-10-CM

## 2015-11-22 DIAGNOSIS — R530 Neoplastic (malignant) related fatigue: Secondary | ICD-10-CM

## 2015-11-22 LAB — COMPREHENSIVE METABOLIC PANEL
ALT: 40 U/L (ref 0–55)
ANION GAP: 9 meq/L (ref 3–11)
AST: 25 U/L (ref 5–34)
Albumin: 3.5 g/dL (ref 3.5–5.0)
Alkaline Phosphatase: 74 U/L (ref 40–150)
BILIRUBIN TOTAL: 0.55 mg/dL (ref 0.20–1.20)
BUN: 9.9 mg/dL (ref 7.0–26.0)
CALCIUM: 9.2 mg/dL (ref 8.4–10.4)
CHLORIDE: 108 meq/L (ref 98–109)
CO2: 23 mEq/L (ref 22–29)
CREATININE: 0.7 mg/dL (ref 0.6–1.1)
EGFR: >90 mL/min/{1.73_m2} (ref >90)
Glucose: 132 mg/dl (ref 70–140)
Potassium: 3.4 mEq/L — ABNORMAL LOW (ref 3.5–5.1)
Sodium: 140 mEq/L (ref 136–145)
TOTAL PROTEIN: 6.8 g/dL (ref 6.4–8.3)

## 2015-11-22 LAB — CBC WITH DIFFERENTIAL/PLATELET
BASO%: 1 % (ref 0.0–2.0)
Basophils Absolute: 0.1 10*3/uL (ref 0.0–0.1)
EOS%: 0.7 % (ref 0.0–7.0)
Eosinophils Absolute: 0 10*3/uL (ref 0.0–0.5)
HEMATOCRIT: 37.9 % (ref 34.8–46.6)
HEMOGLOBIN: 12.6 g/dL (ref 11.6–15.9)
LYMPH#: 0.9 10*3/uL (ref 0.9–3.3)
LYMPH%: 13.3 % — ABNORMAL LOW (ref 14.0–49.7)
MCH: 29.6 pg (ref 25.1–34.0)
MCHC: 33.3 g/dL (ref 31.5–36.0)
MCV: 88.7 fL (ref 79.5–101.0)
MONO#: 0.5 10*3/uL (ref 0.1–0.9)
MONO%: 6.9 % (ref 0.0–14.0)
NEUT%: 78.1 % — ABNORMAL HIGH (ref 38.4–76.8)
NEUTROS ABS: 5.2 10*3/uL (ref 1.5–6.5)
PLATELETS: 244 10*3/uL (ref 145–400)
RBC: 4.27 10*6/uL (ref 3.70–5.45)
RDW: 17.3 % — AB (ref 11.2–14.5)
WBC: 6.7 10*3/uL (ref 3.9–10.3)

## 2015-11-22 MED ORDER — FAMOTIDINE IN NACL 20-0.9 MG/50ML-% IV SOLN
INTRAVENOUS | Status: AC
  Filled 2015-11-22: qty 50

## 2015-11-22 MED ORDER — FAMOTIDINE IN NACL 20-0.9 MG/50ML-% IV SOLN
20.0000 mg | Freq: Once | INTRAVENOUS | Status: AC
  Administered 2015-11-22: 20 mg via INTRAVENOUS

## 2015-11-22 MED ORDER — DIPHENHYDRAMINE HCL 50 MG/ML IJ SOLN
50.0000 mg | Freq: Once | INTRAMUSCULAR | Status: AC
  Administered 2015-11-22: 50 mg via INTRAVENOUS

## 2015-11-22 MED ORDER — PERTUZUMAB CHEMO INJECTION 420 MG/14ML
420.0000 mg | Freq: Once | INTRAVENOUS | Status: AC
  Administered 2015-11-22: 420 mg via INTRAVENOUS
  Filled 2015-11-22: qty 14

## 2015-11-22 MED ORDER — SODIUM CHLORIDE 0.9 % IV SOLN
Freq: Once | INTRAVENOUS | Status: AC
  Administered 2015-11-22: 12:00:00 via INTRAVENOUS
  Filled 2015-11-22: qty 4

## 2015-11-22 MED ORDER — TRASTUZUMAB CHEMO 150 MG IV SOLR
6.0000 mg/kg | Freq: Once | INTRAVENOUS | Status: AC
  Administered 2015-11-22: 630 mg via INTRAVENOUS
  Filled 2015-11-22: qty 30

## 2015-11-22 MED ORDER — DIPHENHYDRAMINE HCL 50 MG/ML IJ SOLN
INTRAMUSCULAR | Status: AC
  Filled 2015-11-22: qty 1

## 2015-11-22 MED ORDER — SODIUM CHLORIDE 0.9 % IV SOLN
Freq: Once | INTRAVENOUS | Status: AC
  Administered 2015-11-22: 12:00:00 via INTRAVENOUS

## 2015-11-22 MED ORDER — DIPHENHYDRAMINE HCL 50 MG/ML IJ SOLN: 25.0000 mg | Freq: Once | INTRAMUSCULAR | Status: DC

## 2015-11-22 MED ORDER — SODIUM CHLORIDE 0.9 % IV SOLN
80.0000 mg/m2 | Freq: Once | INTRAVENOUS | Status: AC
  Administered 2015-11-22: 168 mg via INTRAVENOUS
  Filled 2015-11-22: qty 28

## 2015-11-22 MED ORDER — SODIUM CHLORIDE 0.9% FLUSH
10.0000 mL | INTRAVENOUS | Status: DC | PRN
  Administered 2015-11-22: 10 mL
  Filled 2015-11-22: qty 10

## 2015-11-22 MED ORDER — ACETAMINOPHEN 325 MG PO TABS
650.0000 mg | ORAL_TABLET | Freq: Once | ORAL | Status: AC
  Administered 2015-11-22: 650 mg via ORAL

## 2015-11-22 MED ORDER — ACETAMINOPHEN 325 MG PO TABS
ORAL_TABLET | ORAL | Status: AC
  Filled 2015-11-22: qty 2

## 2015-11-22 MED ORDER — HEPARIN SOD (PORK) LOCK FLUSH 100 UNIT/ML IV SOLN
500.0000 [IU] | Freq: Once | INTRAVENOUS | Status: AC | PRN
  Administered 2015-11-22: 500 [IU]
  Filled 2015-11-22: qty 5

## 2015-11-22 MED ORDER — DIPHENHYDRAMINE HCL 25 MG PO CAPS: 50.0000 mg | ORAL_CAPSULE | Freq: Once | ORAL | Status: DC

## 2015-11-22 NOTE — Assessment & Plan Note (Signed)
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%; HER-2 negative ratio 1.04; 10:00: IDC grade 2-3 dissimilar to 1:00 biopsy 10:00: ER 95%, PR 0%, K67: 25%, HER-2 positive ratio 6.42 right axillary lymph node positive for metastatic cancer (LN is Her 2 neg) --------------------------------------------------------------------------------- Left breast LIQ 08/20/2015: 4.2 cm, 4 mm oval mass 12:00, 1.2 cm mass left UOQ, enlarged left axillary lymph node, T2 N1 stage II B clinical stage  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 CT CAP and Bone scan 08/27/15: Bil Breast masses, Bil Axill LN, Bil lower lobe lung nodules indeterminate 7 mm  --------------------------------------------------------------------------------------------------------------------------------------- Treatment plan: 1. Neoadjuvant chemotherapy with either AC followed by THP (followed by CT chest to evaluate the lung nodules and repeat breast MRI) 2. Followed by bilateral mastectomies and lymph node surgery 3. Followed by adjuvant radiation 4. Followed by adjuvant antiestrogen therapy ------------------------------------------------------------------------------------------------------------------------------------ Current treatment: weekly Taxol and every three-week Herceptin and Perjeta; today's cycle 4 Taxol  Echocardiogram 08/24/2015: EF 60-65%  Chemotherapy toxicities: 1. Mild nausea 2. Mild fatigue and headache 3. Mouth sores: Given a prescription for Magic mouthwash 4. With the first dose of Herceptin, right worse followed by  Demerol which caused nausea and vomiting 5. Diarrhea due to Perjeta resolved after 1-2 days 5. Nosebleeds and rectal bleed related to diarrhea  Return to clinic in 3 weeks for cycle 7/12 THP

## 2015-11-22 NOTE — Progress Notes (Signed)
Per meghan herceptin approved also

## 2015-11-22 NOTE — Telephone Encounter (Signed)
appt made and avs printed °

## 2015-11-22 NOTE — Patient Instructions (Signed)
[facility] Discharge Instructions for Patients Receiving Chemotherapy  Today you received the following chemotherapy agents Herceptin/Perjeta/Taxol  To help prevent nausea and vomiting after your treatment, we encourage you to take your nausea medication as directed by your MD.    If you develop nausea and vomiting that is not controlled by your nausea medication, call the clinic.   BELOW ARE SYMPTOMS THAT SHOULD BE REPORTED IMMEDIATELY:  *FEVER GREATER THAN 100.5 F  *CHILLS WITH OR WITHOUT FEVER  NAUSEA AND VOMITING THAT IS NOT CONTROLLED WITH YOUR NAUSEA MEDICATION  *UNUSUAL SHORTNESS OF BREATH  *UNUSUAL BRUISING OR BLEEDING  TENDERNESS IN MOUTH AND THROAT WITH OR WITHOUT PRESENCE OF ULCERS  *URINARY PROBLEMS  *BOWEL PROBLEMS  UNUSUAL RASH Items with * indicate a potential emergency and should be followed up as soon as possible.  Feel free to call the clinic you have any questions or concerns. The clinic phone number is [phone].  Please show the [facility] at check-in to the Emergency Department and triage nurse.   Trastuzumab injection for infusion HERCEPTIN Qu es este medicamento? El TRASTUZUMAB es un anticuerpo monoclonal. Genene Churn para tratar el cncer de mama y de [place]. Este medicamento puede ser utilizado para otros usos; si tiene alguna pregunta consulte con su proveedor de atencin mdica o con su farmacutico. Qu le debo informar a mi profesional de la salud antes de tomar este medicamento? Necesita saber si usted presenta alguno de los siguientes problemas o situaciones: -enfermedad cardiaca -insuficiencia cardiaca -infeccin (especialmente infecciones virales, como la varicela o el herpes) -enfermedad pulmonar o respiratoria, como asma -radioterapia reciente o continuada -una reaccin alrgica o inusual al trastuzumab, al alcohol benclico, a otros medicamentos, alimentos, colorantes o conservantes -si est embarazada  o buscando quedar embarazada -si est amamantando a un beb Cmo debo utilizar este medicamento? Este medicamento se administra mediante infusin por va intravenosa. Lo administra un profesional de la salud calificado en un hospital o en un entorno clnico. Hable con su pediatra para informarse acerca del uso de este medicamento en nios. Este medicamento no est aprobado para uso en nios. Sobredosis: Pngase en contacto inmediatamente con un centro toxicolgico o una sala de urgencia si usted cree que haya tomado demasiado medicamento. ATENCIN: [employer] es solo para usted. No comparta este medicamento con nadie. Qu sucede si me olvido de una dosis? Es importante no olvidar ninguna dosis. Informe a su mdico o a su profesional de la salud si no puede asistir a Photographer. Qu puede interactuar con este medicamento? -doxorubicina -warfarina Puede ser que esta lista no menciona todas las posibles interacciones. Informe a su profesional de KB Home	[place] de [employer] productos a base de hierbas, medicamentos de [place] o suplementos nutritivos que est tomando. Si usted fuma, consume bebidas alcohlicas o si utiliza drogas ilegales, indqueselo tambin a su profesional de KB Home	[place]. Algunas sustancias pueden interactuar con su medicamento. A qu debo estar atento al usar Coca-Cola? Visite a su mdico para chequear su evolucin peridicamente. Si presenta algn efecto secundario, infrmelo. Sin embargo, contine con el tratamiento aun si se siente enfermo, a menos que su mdico le indique que lo suspenda. Consulte a su mdico o a su profesional de la salud si tiene fiebre, escalofros, dolor de garganta, o cualquier otro sntoma de resfro o gripe. No se trate usted mismo. Trate de no acercarse a personas que estn enfermas. Es posible que tenga [place], escalofros y temblores durante su primera infusin. Estos  efectos generalmente son leves y pueden tratarse con otros medicamentos.  Informe cualquier efecto secundario durante la infusin a su profesional de [employer]. La fiebre y los escalofros por lo general no suceden con las infusiones posteriores. No debe quedar embarazada mientras est tomando este medicamento o por 7 meses despus de dejar de usarlo. Las mujeres deben informar a su mdico si estn buscando quedar embarazadas o si creen que estn embarazadas. Las mujeres con la posibilidad de Warehouse manager nios deben tener una prueba de embarazo negativa antes de Corporate investment banker a tomar este medicamento. Existe la posibilidad de que ocurran efectos secundarios graves a un beb sin nacer. Para ms informacin hable con su profesional de la salud o su farmacutico. No debe amamantar a un beb mientras est tomando este medicamento o por 7 meses despus de dejar de usarlo. Las [employer] deben usar un mtodo anticonceptivo eficaz con este medicamento. Qu efectos secundarios puedo tener al [employer] este medicamento? Efectos secundarios que debe informar a su mdico o a Scientist, water quality de la salud tan pronto como sea posible: -problemas respiratorios -Journalist, newspaper o palpitaciones -tos -mareos o desmayos -fiebre o escalofros, dolor de garganta -erupcin cutnea, picazn o urticarias -hinchazn de los tobillos o las piernas -cansancio o debilidad inusual Efectos secundarios que, por lo general, no requieren Psychologist, prison and probation services (debe informarlos a su mdico u otro profesional de la salud si persisten o si son molestos): -prdida del apetito -dolor de cabeza -dolores musculares -nuseas Puede ser que esta lista no menciona todos los posibles efectos secundarios. Comunquese a su mdico por asesoramiento mdico [employer]. Usted puede informar los efectos secundarios a la FDA por telfono al 1-800-FDA-1088. Dnde debo guardar mi medicina? Este medicamento se administra en hospitales o clnicas y no necesitar guardarlo en su domicilio. ATENCIN: Este folleto es un  resumen. Puede ser que no cubra toda la posible informacin. Si usted tiene preguntas acerca de esta medicina, consulte con su mdico, su farmacutico o su profesional de Radiographer, therapeutic.    2016, Elsevier/Gold Standard. (2014-09-14 00:00:00)  Pertuzumab injection Perjeta Qu es este medicamento? El PERTUZUMAB es un anticuerpo monoclonal que acta sobre una protena llamada HER2. HER2 se encuentra en algunos cnceres de mama. Este medicamento puede detener el crecimiento de las clulas cancerosas. Este Automatic Data se puede usar con otros tratamientos para Management consultant. Este medicamento puede ser utilizado para otros usos; si tiene alguna pregunta consulte con su proveedor de atencin mdica o con su farmacutico. Qu le debo informar a mi profesional de la salud antes de tomar este medicamento? Necesita saber si usted presenta alguno de los siguientes problemas o situaciones: enfermedad cardiaca insuficiencia cardiaca alta presin sangunea antecedentes de pulso cardiaca irregular radioterapia reciente o continuada una reaccin alrgica o inusual al pertuzumab, a otros medicamentos, alimentos, colorantes o conservantes si est embarazada o buscando quedar embarazada si est amamantando a un beb Cmo debo utilizar este medicamento? Este medicamento se administra mediante infusin por va intravenosa. Lo administra un profesional de Radiographer, therapeutic en un hospital o en un entorno clnico. Hable con su pediatra para informarse acerca del uso de este medicamento en nios. Puede requerir atencin especial. Sobredosis: Pngase en contacto inmediatamente con un centro toxicolgico o una sala de urgencia si usted cree que haya tomado demasiado medicamento. ATENCIN: [employer] es solo para usted. No comparta este medicamento con nadie. Qu sucede si me olvido de una dosis? Es importante no olvidar ninguna dosis. Informe a su mdico o a Producer, television/film/video  de la salud si no puede asistir a Photographer. Qu puede  interactuar con este medicamento? No se esperan interacciones. Puede ser que esta lista no menciona todas las posibles interacciones. Informe a su profesional de KB Home	[place] de [employer] productos a base de hierbas, medicamentos de [place] o suplementos nutritivos que est tomando. Si usted fuma, consume bebidas alcohlicas o si utiliza drogas ilegales, indqueselo tambin a su profesional de KB Home	[place]. Algunas sustancias pueden interactuar con su medicamento. A qu debo estar atento al usar Coca-Cola? Se supervisar su estado de salud atentamente mientras reciba este medicamento. Si presenta algn efecto secundario, infrmelo. Sin embargo, contine con el tratamiento aun si se siente enfermo, a menos que su mdico le indique que lo suspenda. No debe quedar embarazada mientras est tomando este medicamento o por 7 meses despus de dejar de usarlo. Las mujeres deben informar a su mdico si estn buscando quedar embarazadas o si creen que estn embarazadas. Las mujeres con la posibilidad de Best boy nios deben tener una prueba de embarazo negativa antes de Art gallery manager a tomar este medicamento. Existe la posibilidad de que ocurran efectos secundarios graves a un beb sin nacer. Para ms informacin hable con su profesional de la salud o su farmacutico. No debe amamantar a un beb mientras est tomando este medicamento o por 7 meses despus de dejar de usarlo. Las [employer] deben usar un mtodo anticonceptivo eficaz con este medicamento. Consulte a su mdico o a su profesional de la salud si tiene fiebre, escalofros, dolor de garganta, o cualquier otro sntoma de resfro o gripe. No se trate usted mismo. Trate de no acercarse a personas que estn enfermas. Es posible que tenga [place], escalofros y dolor de cabeza durante la infusin. Informe cualquier efecto secundario durante la infusin a su profesional de KB Home	[place]. Qu efectos secundarios puedo tener al [employer] este medicamento? Efectos secundarios que  debe informar a su mdico o a Barrister's clerk de la salud tan pronto como sea posible: Arboriculturist o palpitaciones mareos sensacin de Youth worker o aturdimiento fiebre o escalofros erupcin cutnea, picazn o urticarias dolor de garganta hinchazn de la cara, labio o lengua hinchazn de las piernas o tobillos cansancio o debilidad inusual Efectos secundarios que, por lo general, no requieren atencin mdica (debe informarlos a su mdico o a su profesional de la salud si persisten o si son molestos): diarrea cada del cabello nuseas, vmito somnolencia Puede ser que esta lista no menciona todos los posibles efectos secundarios. Comunquese a su mdico por asesoramiento mdico [employer]. Usted puede informar los efectos secundarios a la FDA por telfono al 1-800-FDA-1088. Dnde debo guardar mi medicina? Este medicamento se administra en hospitales o clnicas y no necesitar guardarlo en su domicilio. ATENCIN: Este folleto es un resumen. Puede ser que no cubra toda la posible informacin. Si usted tiene preguntas acerca de esta medicina, consulte con su mdico, su farmacutico o su profesional de Technical sales engineer.    2016, Elsevier/Gold Standard. (2014-09-14 00:00:00)   Paclitaxel injection  Taxol Qu es este medicamento? El PACLITAXEL es un agente quimioteraputico. Este medicamento acta sobre las clulas que se dividen rpidamente, como las clulas cancerosas, y finalmente provoca la muerte de estas clulas. Se utiliza en el tratamiento del cncer de [place], mama y otros tipos de cncer. Este medicamento puede ser utilizado para otros usos; si tiene alguna pregunta consulte con su proveedor de atencin mdica o con su farmacutico. Qu le debo informar a mi  profesional de la salud antes de tomar este medicamento? Necesita saber si usted presenta alguno de los siguientes problemas o situaciones: -trastornos sanguneos -pulso cardiaco  irregular -infeccin (especialmente infecciones virales, como varicela o herpes) -enfermedad heptica -radioterapia reciente o continua -una reaccin alrgica o inusual al paclitaxel, alcohol, aceite de ricino polioxietilado, otros agentes quimioteraputicos, medicamentos, alimentos, colorantes o conservantes -si est embarazada o buscando quedar embarazada -si est amamantando a un beb Cmo debo utilizar este medicamento? Este medicamento se administra mediante infusin por va intravenosa. Lo administra un profesional de la salud calificado en un hospital o en un entorno clnico. Hable con su pediatra para informarse acerca del uso de este medicamento en nios. Puede requerir atencin especial. Sobredosis: Pngase en contacto inmediatamente con un centro toxicolgico o una sala de urgencia si usted cree que haya tomado demasiado medicamento. ATENCIN: [employer] es solo para usted. No comparta este medicamento con nadie. Qu sucede si me olvido de una dosis? Es importante no olvidar ninguna dosis. Informe a su mdico o a su profesional de la salud si no puede asistir a Photographer. Qu puede interactuar con este medicamento? No tome esta medicina con ninguno de los siguientes medicamentos: -disulfiram -metronidazol Esta medicina tambin puede interactuar con los siguientes medicamentos: -ciclosporina -diazepam -quetoconazol -medicamentos para incrementar los conteos sanguneos, tales como filgrastim, pegfilgrastim, sargramostim -otros agentes quimioteraputicos, tales como cisplatino, doxorrubicina, epirubicina, etopsido, teniposida, vincristina -quinidina -testosterona -vacunas -verapamilo Consulte a su mdico o a su profesional de la salud antes de tomar cualquiera de los siguientes medicamentos: -acetaminofeno -aspirina -ibuprofeno -quetoprofeno -naproxeno Puede ser que esta lista no menciona todas las posibles interacciones. Informe a su profesional de KB Home	[place] de  [employer] productos a base de hierbas, medicamentos de [place] o suplementos nutritivos que est tomando. Si usted fuma, consume bebidas alcohlicas o si utiliza drogas ilegales, indqueselo tambin a su profesional de KB Home	[place]. Algunas sustancias pueden interactuar con su medicamento. A qu debo estar atento al usar Coca-Cola? Se supervisar su estado de salud atentamente mientras reciba este medicamento. Tendr que hacerse anlisis de sangre importantes mientras est tomando este medicamento. Este medicamento puede hacerle sentir un Nurse, mental health. Esto es normal ya que la quimioterapia afecta tanto a las clulas sanas como a las clulas cancerosas. Si presenta alguno de los [employer], infrmelos. Sin embargo, contine con el tratamiento aun si se siente enfermo, a menos que su mdico le indique que lo suspenda. Este medicamento puede causar Chief of Staff graves. Para reducir su riesgo necesitar tomar otro(s) medicamento(s) antes del tratamiento con este medicamento. En algunos casos, podr recibir [employer] para ayudarlo con los efectos secundarios. Siga las instrucciones para usarlos. Consulte a su mdico o a su profesional de la salud si tiene fiebre, escalofros, dolor de garganta o cualquier otro sntoma de resfro o gripe. No se trate usted mismo. Este medicamento puede reducir la capacidad del cuerpo para combatir infecciones. Trate de no acercarse a personas que estn enfermas. [employer] puede aumentar el riesgo de magulladuras o sangrado. Consulte a su mdico o a su profesional de la salud si observa sangrados inusuales. Proceda con cuidado al cepillar sus dientes, usar hilo dental o Risk manager palillos para los dientes, ya que puede contraer una infeccin o Therapist, art con mayor facilidad. Si se somete a algn tratamiento dental, informe a su dentista que est [employer]. Evite tomar productos que contienen aspirina, acetaminofeno,  ibuprofeno, naproxeno o quetoprofeno a menos que as lo indique su mdico. Estos productos pueden  disimular la fiebre. No debe quedar embarazada mientras recibe este medicamento. Las mujeres deben informar a su mdico si estn buscando quedar embarazadas o si creen que estn embarazadas. Existe la posibilidad de que ocurran efectos secundarios graves a un beb sin nacer. Para ms informacin hable con su profesional de la salud o su farmacutico. No debe amamantar a un beb mientras est tomando este medicamento. Para los hombres, se desaconseja tener nios mientras reciben Coca[employer]a. Qu efectos secundarios puedo tener al [employer] este medicamento? Efectos secundarios que debe informar a su mdico o a Barrister's clerk de la salud tan pronto como sea posible: Chief of Staff, como erupcin cutnea, picazn o urticarias, hinchazn de la cara, los labios o la lengua conteos sanguneos bajos - este frmaco puede reducir la cantidad de glbulos blancos, glbulos rojos y plaquetas. Su riesgo de infeccin y [place]. signos de infeccin - fiebre o escalofros, tos, dolor de garganta, dolor o dificultad para orinar signos de reduccin de plaquetas o sangrado - magulladuras, puntos rojos en la piel, heces de color oscuro o con aspecto alquitranado, sangrado por la nariz signos de reduccin de glbulos rojos - cansancio o debilidad inusual, desmayos, sensacin de mareo problemas respiratorios dolor en el pecho alta o baja presin sangunea llagas en la boca nuseas y vmito dolor, hinchazn, enrojecimiento o irritacin en el lugar de la [place], hormigueo, entumecimiento de las manos o los pies pulso cardiaco lento o irregular hinchazn de tobillos, pies, manosEfectos secundarios que, por lo general, no requieren atencin mdica (debe informarlos a su mdico o a su profesional de la salud si persisten o si son molestos): dolor de huesos cada total del cabello, incluyendo el cabello de  la cabeza, de las [place], el vello pbico, las cejas y las pestaas cambios en el color de las uas de las manos diarrea aflojamiento de las uas de las manos prdida del apetito dolores musculares o articulares enrojecimiento de la piel sudoracin Puede ser que esta lista no menciona todos los posibles efectos secundarios. Comunquese a su mdico por asesoramiento mdico [employer]. Usted puede informar los efectos secundarios a la FDA por telfono al 1-800-FDA-1088. Dnde debo guardar mi medicina? Este medicamento se administra en hospitales o clnicas y no necesitar guardarlo en su domicilio. ATENCIN: Este folleto es un resumen. Puede ser que no cubra toda la posible informacin. Si usted tiene preguntas acerca de esta medicina, consulte con su mdico, su farmacutico o su profesional de Technical sales engineer.    2016, Elsevier/Gold Standard. (2014-08-25 00:00:00)

## 2015-11-22 NOTE — Progress Notes (Signed)
Patient Care Team: No Pcp Per Patient as PCP - General (General Practice)  SUMMARY OF ONCOLOGIC HISTORY:   Bilateral breast cancer ([place]) (Resolved)   08/16/2015 Mammogram    Left breast LIQ 4.2 cm, 4 mm oval mass 12:00, 1.2 cm mass left UOQ, enlarged left axillary lymph node     08/16/2015 Mammogram    Right breast UIQ: 3.9 cm irregular mass, 1.5 cm mass in the right LOQ, 9 mm mass right UOQ, 6 mm mass right LIQ, enlarged lymph node Rt axilla     08/20/2015 Initial Diagnosis    Right breast biopsy 1:00: IDC grade 2 ER 95%, PR 2%, Ki 67 15%; HER-2 negative ratio 1.04; 10:00: IDC grade 2-3 dissimilar to 1:00 biopsy 10:00: ER 95%, PR 0%, K67: 25%, HER-2 positive ratio 6.42 right axillary lymph node positive for metastatic cancer     08/20/2015 Procedure    Left biopsy 8:00: IDC with DCIS, grade 2; ER 95%, PR 95%, Ki-67 30%, HER-2 negative ratio 1.2; 12:00: IDC with DCIS, grade 1 ER 95%, PR 95%, Ki-67 10% HER-2 negative ratio 1.14 not similar to this biopsy, left axillary lymph node positive,      09/13/2015 -  Neo-Adjuvant Chemotherapy    Neoadjuvant dose dense Adriamycin and Cytoxan followed by Taxol Herceptin Perjeta      CHIEF COMPLIANT: Cycle 4 Taxol, cycle 2 Herceptin Perjeta every 3 weeks  INTERVAL HISTORY: Burlene Arnt is a 55 year old with above-mentioned history of right breast cancer currently neoadjuvant chemotherapy and today is cycle #4 of Taxol. She had tolerated Taxol fairly well. This is a second dose of Herceptin Perjeta is being given every 3 weeks. She reports alopecia, nausea mild, decreased appetite, she also had fatigue and diarrhea with Perjeta. She continues to have nosebleeds intermittently.  REVIEW OF SYSTEMS:   Constitutional: Denies fevers, chills or abnormal weight loss, fatigue Eyes: Denies blurriness of vision Ears, nose, mouth, throat, and face: Intermittent nosebleeds Respiratory: Denies cough, dyspnea or wheezes Cardiovascular: Denies palpitation,  chest discomfort Gastrointestinal:  Intermittent diarrhea after Perjeta Skin: Denies abnormal skin rashes Lymphatics: Denies new lymphadenopathy or easy bruising Neurological:Denies numbness, tingling or new weaknesses Behavioral/Psych: Mood is stable, no new changes  Extremities: No lower extremity edema  All other systems were reviewed with the patient and are negative.  I have reviewed the past medical history, past surgical history, social history and family history with the patient and they are unchanged from previous note.  ALLERGIES:  has No Known Allergies.  MEDICATIONS:  Current Outpatient Prescriptions  Medication Sig Dispense Refill  . dexamethasone (DECADRON) 4 MG tablet Take 4 mg by mouth daily. Start day after chemo.  Take 1 tablet daily for 2 days after chemo.    Marland Kitchen HYDROcodone-acetaminophen (NORCO) 10-325 MG tablet Take 1 tablet by mouth every 6 (six) hours as needed. 10 tablet 0  . lidocaine-prilocaine (EMLA) cream Apply 1 application topically as needed (Apply 2- hours prior to access of port).    . LORazepam (ATIVAN) 0.5 MG tablet Take 0.5 mg by mouth every 6 (six) hours as needed for anxiety.    . magic mouthwash w/lidocaine SOLN Take 5 mLs by mouth 3 (three) times daily as needed for mouth pain. 100 mL 0  . naproxen (NAPROSYN) 500 MG tablet Take 500 mg by mouth 2 (two) times daily with a meal.    . prochlorperazine (COMPAZINE) 10 MG tablet Take 10 mg by mouth every 6 (six) hours as needed for nausea or vomiting.  No current facility-administered medications for this visit.     PHYSICAL EXAMINATION: ECOG PERFORMANCE STATUS: 1 - Symptomatic but completely ambulatory  Vitals:   11/22/15 1133  BP: 127/67  Pulse: 72  Resp: 18  Temp: 97.8 F (36.6 C)   Filed Weights   11/22/15 1133  Weight: 228 lb 3.2 oz (103.5 kg)    GENERAL:alert, no distress and comfortable SKIN: skin color, texture, turgor are normal, no rashes or significant lesions EYES: normal,  Conjunctiva are pink and non-injected, sclera clear OROPHARYNX:no exudate, no erythema and lips, buccal mucosa, and tongue normal  NECK: supple, thyroid normal size, non-tender, without nodularity LYMPH:  no palpable lymphadenopathy in the cervical, axillary or inguinal LUNGS: clear to auscultation and percussion with normal breathing effort HEART: regular rate & rhythm and no murmurs and no lower extremity edema ABDOMEN:abdomen soft, non-tender and normal bowel sounds MUSCULOSKELETAL:no cyanosis of digits and no clubbing  NEURO: alert & oriented x 3 with fluent speech, no focal motor/sensory deficits EXTREMITIES: No lower extremity edema  LABORATORY DATA:  I have reviewed the data as listed   Chemistry      Component Value Date/Time   NA 139 11/15/2015 1044   K 3.3 (L) 11/15/2015 1044   CO2 23 11/15/2015 1044   BUN 7.9 11/15/2015 1044   CREATININE 0.7 11/15/2015 1044      Component Value Date/Time   CALCIUM 9.3 11/15/2015 1044   ALKPHOS 69 11/15/2015 1044   AST 25 11/15/2015 1044   ALT 39 11/15/2015 1044   BILITOT 0.64 11/15/2015 1044       Lab Results  Component Value Date   WBC 6.7 11/22/2015   HGB 12.6 11/22/2015   HCT 37.9 11/22/2015   MCV 88.7 11/22/2015   PLT 244 11/22/2015   NEUTROABS 5.2 11/22/2015     ASSESSMENT & PLAN:  Bilateral malignant neoplasm of overlapping sites of breast in female [facility]) 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%; HER-2 negative ratio 1.04; 10:00: IDC grade 2-3 dissimilar to 1:00 biopsy 10:00: ER 95%, PR 0%, K67: 25%, HER-2 positive ratio 6.42 right axillary lymph node positive for metastatic cancer (LN is Her 2 neg) --------------------------------------------------------------------------------- Left breast LIQ 08/20/2015: 4.2  cm, 4 mm oval mass 12:00, 1.2 cm mass left UOQ, enlarged left axillary lymph node, T2 N1 stage II B clinical stage  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 CT CAP and Bone scan 08/27/15: Bil Breast masses, Bil Axill LN, Bil lower lobe lung nodules indeterminate 7 mm  --------------------------------------------------------------------------------------------------------------------------------------- Treatment plan: 1. Neoadjuvant chemotherapy with either AC followed by THP (followed by CT chest to evaluate the lung nodules and repeat breast MRI) 2. Followed by bilateral mastectomies and lymph node surgery 3. Followed by adjuvant radiation 4. Followed by adjuvant antiestrogen therapy ------------------------------------------------------------------------------------------------------------------------------------ Current treatment: weekly Taxol and every three-week Herceptin and Perjeta; today's cycle 4 Taxol and cycle 2 Herceptin and Perjeta  Echocardiogram 08/24/2015: EF 60-65%  Chemotherapy toxicities: 1. Mild nausea 2. Mild fatigue and headache 3. Mouth sores: Given a prescription  for Magic mouthwash 4. Infusion reaction: With the first dose of Herceptin, got worse followed by Demerol which caused nausea and vomiting 5. Diarrhea due to Perjeta resolved after 1-2 days 5. Nosebleeds and rectal bleed related to diarrhea  Return to clinic in 3 weeks for cycle 7/12 T and cycle 3 HP   No orders of the defined types were placed in this encounter.  The patient has a good understanding of the overall plan. she agrees with it. she will call with any problems that may develop before the next visit here.   Rulon Eisenmenger, MD 11/22/15

## 2015-11-29 ENCOUNTER — Encounter: Payer: Self-pay | Admitting: *Deleted

## 2015-11-29 ENCOUNTER — Other Ambulatory Visit (HOSPITAL_BASED_OUTPATIENT_CLINIC_OR_DEPARTMENT_OTHER): Payer: Self-pay

## 2015-11-29 ENCOUNTER — Ambulatory Visit (HOSPITAL_BASED_OUTPATIENT_CLINIC_OR_DEPARTMENT_OTHER): Payer: Self-pay

## 2015-11-29 VITALS — BP 146/78 | HR 67 | Temp 97.7°F | Resp 18

## 2015-11-29 DIAGNOSIS — C50812 Malignant neoplasm of overlapping sites of left female breast: Secondary | ICD-10-CM

## 2015-11-29 DIAGNOSIS — C50312 Malignant neoplasm of lower-inner quadrant of left female breast: Secondary | ICD-10-CM

## 2015-11-29 DIAGNOSIS — C50411 Malignant neoplasm of upper-outer quadrant of right female breast: Secondary | ICD-10-CM

## 2015-11-29 DIAGNOSIS — Z5111 Encounter for antineoplastic chemotherapy: Secondary | ICD-10-CM

## 2015-11-29 DIAGNOSIS — C50811 Malignant neoplasm of overlapping sites of right female breast: Secondary | ICD-10-CM

## 2015-11-29 DIAGNOSIS — C50211 Malignant neoplasm of upper-inner quadrant of right female breast: Secondary | ICD-10-CM

## 2015-11-29 LAB — CBC WITH DIFFERENTIAL/PLATELET
BASO%: 0.6 % (ref 0.0–2.0)
Basophils Absolute: 0 10*3/uL (ref 0.0–0.1)
EOS ABS: 0.1 10*3/uL (ref 0.0–0.5)
EOS%: 1 % (ref 0.0–7.0)
HCT: 36.2 % (ref 34.8–46.6)
HGB: 12.3 g/dL (ref 11.6–15.9)
LYMPH%: 14 % (ref 14.0–49.7)
MCH: 30.2 pg (ref 25.1–34.0)
MCHC: 34 g/dL (ref 31.5–36.0)
MCV: 88.9 fL (ref 79.5–101.0)
MONO#: 0.6 10*3/uL (ref 0.1–0.9)
MONO%: 7.9 % (ref 0.0–14.0)
NEUT%: 76.5 % (ref 38.4–76.8)
NEUTROS ABS: 5.6 10*3/uL (ref 1.5–6.5)
PLATELETS: 218 10*3/uL (ref 145–400)
RBC: 4.07 10*6/uL (ref 3.70–5.45)
RDW: 16.2 % — ABNORMAL HIGH (ref 11.2–14.5)
WBC: 7.2 10*3/uL (ref 3.9–10.3)
lymph#: 1 10*3/uL (ref 0.9–3.3)

## 2015-11-29 LAB — COMPREHENSIVE METABOLIC PANEL
ALBUMIN: 3.6 g/dL (ref 3.5–5.0)
ALK PHOS: 72 U/L (ref 40–150)
ALT: 42 U/L (ref 0–55)
ANION GAP: 9 meq/L (ref 3–11)
AST: 28 U/L (ref 5–34)
BILIRUBIN TOTAL: 0.56 mg/dL (ref 0.20–1.20)
BUN: 10.5 mg/dL (ref 7.0–26.0)
CO2: 23 meq/L (ref 22–29)
CREATININE: 0.7 mg/dL (ref 0.6–1.1)
Calcium: 9.5 mg/dL (ref 8.4–10.4)
Chloride: 107 mEq/L (ref 98–109)
Glucose: 113 mg/dl (ref 70–140)
Potassium: 3.5 mEq/L (ref 3.5–5.1)
Sodium: 139 mEq/L (ref 136–145)
TOTAL PROTEIN: 7 g/dL (ref 6.4–8.3)

## 2015-11-29 MED ORDER — DIPHENHYDRAMINE HCL 50 MG/ML IJ SOLN
25.0000 mg | Freq: Once | INTRAMUSCULAR | Status: AC
  Administered 2015-11-29: 25 mg via INTRAVENOUS

## 2015-11-29 MED ORDER — HEPARIN SOD (PORK) LOCK FLUSH 100 UNIT/ML IV SOLN
500.0000 [IU] | Freq: Once | INTRAVENOUS | Status: AC | PRN
  Administered 2015-11-29: 500 [IU]
  Filled 2015-11-29: qty 5

## 2015-11-29 MED ORDER — FAMOTIDINE IN NACL 20-0.9 MG/50ML-% IV SOLN
20.0000 mg | Freq: Once | INTRAVENOUS | Status: AC
  Administered 2015-11-29: 20 mg via INTRAVENOUS

## 2015-11-29 MED ORDER — SODIUM CHLORIDE 0.9 % IV SOLN
Freq: Once | INTRAVENOUS | Status: AC
  Administered 2015-11-29: 11:00:00 via INTRAVENOUS

## 2015-11-29 MED ORDER — DIPHENHYDRAMINE HCL 50 MG/ML IJ SOLN
INTRAMUSCULAR | Status: AC
  Filled 2015-11-29: qty 1

## 2015-11-29 MED ORDER — PACLITAXEL CHEMO INJECTION 300 MG/50ML
80.0000 mg/m2 | Freq: Once | INTRAVENOUS | Status: AC
  Administered 2015-11-29: 168 mg via INTRAVENOUS
  Filled 2015-11-29: qty 28

## 2015-11-29 MED ORDER — SODIUM CHLORIDE 0.9 % IV SOLN
Freq: Once | INTRAVENOUS | Status: AC
  Administered 2015-11-29: 11:00:00 via INTRAVENOUS
  Filled 2015-11-29: qty 4

## 2015-11-29 MED ORDER — SODIUM CHLORIDE 0.9% FLUSH
10.0000 mL | INTRAVENOUS | Status: DC | PRN
  Administered 2015-11-29: 10 mL
  Filled 2015-11-29: qty 10

## 2015-11-29 MED ORDER — FAMOTIDINE IN NACL 20-0.9 MG/50ML-% IV SOLN
INTRAVENOUS | Status: AC
  Filled 2015-11-29: qty 50

## 2015-11-29 NOTE — Patient Instructions (Signed)
[facility] Discharge Instructions for Patients Receiving Chemotherapy  Today you received the following chemotherapy agents Taxol   To help prevent nausea and vomiting after your treatment, we encourage you to take your nausea medication as directed.   If you develop nausea and vomiting that is not controlled by your nausea medication, call the clinic.   BELOW ARE SYMPTOMS THAT SHOULD BE REPORTED IMMEDIATELY:  *FEVER GREATER THAN 100.5 F  *CHILLS WITH OR WITHOUT FEVER  NAUSEA AND VOMITING THAT IS NOT CONTROLLED WITH YOUR NAUSEA MEDICATION  *UNUSUAL SHORTNESS OF BREATH  *UNUSUAL BRUISING OR BLEEDING  TENDERNESS IN MOUTH AND THROAT WITH OR WITHOUT PRESENCE OF ULCERS  *URINARY PROBLEMS  *BOWEL PROBLEMS  UNUSUAL RASH Items with * indicate a potential emergency and should be followed up as soon as possible.  Feel free to call the clinic you have any questions or concerns. The clinic phone number is [phone].  Please show the CHEMO ALERT CARD at check-in to the Emergency Department and triage nurse.   

## 2015-11-29 NOTE — Progress Notes (Signed)
Interpretor present during first part of today's visit. Addressed all questions for assessment and explained all medications.  No complaints or questions.    Interpretor: Anastasio Auerbach [phone]

## 2015-11-29 NOTE — Progress Notes (Signed)
Patient reports numbness in finger tips that is a new report.  Does not interfere with ADL.  Per Dr. Lindi Adie & Stanton Kidney RN ok to proceed.

## 2015-12-06 ENCOUNTER — Other Ambulatory Visit (HOSPITAL_BASED_OUTPATIENT_CLINIC_OR_DEPARTMENT_OTHER): Payer: Self-pay

## 2015-12-06 ENCOUNTER — Ambulatory Visit (HOSPITAL_BASED_OUTPATIENT_CLINIC_OR_DEPARTMENT_OTHER): Payer: Self-pay

## 2015-12-06 ENCOUNTER — Other Ambulatory Visit: Payer: Self-pay

## 2015-12-06 VITALS — BP 144/82 | HR 86 | Temp 97.8°F | Resp 18

## 2015-12-06 DIAGNOSIS — C50312 Malignant neoplasm of lower-inner quadrant of left female breast: Secondary | ICD-10-CM

## 2015-12-06 DIAGNOSIS — C50812 Malignant neoplasm of overlapping sites of left female breast: Principal | ICD-10-CM

## 2015-12-06 DIAGNOSIS — C50811 Malignant neoplasm of overlapping sites of right female breast: Secondary | ICD-10-CM

## 2015-12-06 DIAGNOSIS — C50211 Malignant neoplasm of upper-inner quadrant of right female breast: Secondary | ICD-10-CM

## 2015-12-06 DIAGNOSIS — Z5111 Encounter for antineoplastic chemotherapy: Secondary | ICD-10-CM

## 2015-12-06 DIAGNOSIS — C50411 Malignant neoplasm of upper-outer quadrant of right female breast: Secondary | ICD-10-CM

## 2015-12-06 LAB — CBC WITH DIFFERENTIAL/PLATELET
BASO%: 0.3 % (ref 0.0–2.0)
Basophils Absolute: 0 10*3/uL (ref 0.0–0.1)
EOS ABS: 0.1 10*3/uL (ref 0.0–0.5)
EOS%: 0.9 % (ref 0.0–7.0)
HCT: 35.6 % (ref 34.8–46.6)
HEMOGLOBIN: 12.2 g/dL (ref 11.6–15.9)
LYMPH%: 15.3 % (ref 14.0–49.7)
MCH: 30.4 pg (ref 25.1–34.0)
MCHC: 34.3 g/dL (ref 31.5–36.0)
MCV: 88.8 fL (ref 79.5–101.0)
MONO#: 0.6 10*3/uL (ref 0.1–0.9)
MONO%: 8.8 % (ref 0.0–14.0)
NEUT%: 74.7 % (ref 38.4–76.8)
NEUTROS ABS: 5 10*3/uL (ref 1.5–6.5)
PLATELETS: 213 10*3/uL (ref 145–400)
RBC: 4.01 10*6/uL (ref 3.70–5.45)
RDW: 15.8 % — AB (ref 11.2–14.5)
WBC: 6.7 10*3/uL (ref 3.9–10.3)
lymph#: 1 10*3/uL (ref 0.9–3.3)

## 2015-12-06 LAB — COMPREHENSIVE METABOLIC PANEL
ALBUMIN: 3.5 g/dL (ref 3.5–5.0)
ALK PHOS: 68 U/L (ref 40–150)
ALT: 41 U/L (ref 0–55)
AST: 28 U/L (ref 5–34)
Anion Gap: 9 mEq/L (ref 3–11)
BILIRUBIN TOTAL: 0.56 mg/dL (ref 0.20–1.20)
BUN: 8.4 mg/dL (ref 7.0–26.0)
CO2: 23 meq/L (ref 22–29)
CREATININE: 0.7 mg/dL (ref 0.6–1.1)
Calcium: 9.7 mg/dL (ref 8.4–10.4)
Chloride: 107 mEq/L (ref 98–109)
GLUCOSE: 121 mg/dL (ref 70–140)
Potassium: 3.1 mEq/L — ABNORMAL LOW (ref 3.5–5.1)
SODIUM: 140 meq/L (ref 136–145)
TOTAL PROTEIN: 6.9 g/dL (ref 6.4–8.3)

## 2015-12-06 MED ORDER — POTASSIUM CHLORIDE CRYS ER 20 MEQ PO TBCR: 20.0000 meq | EXTENDED_RELEASE_TABLET | Freq: Every day | ORAL | 0 refills | Status: DC

## 2015-12-06 MED ORDER — FAMOTIDINE IN NACL 20-0.9 MG/50ML-% IV SOLN
INTRAVENOUS | Status: AC
  Filled 2015-12-06: qty 50

## 2015-12-06 MED ORDER — SODIUM CHLORIDE 0.9 % IV SOLN
Freq: Once | INTRAVENOUS | Status: AC
  Administered 2015-12-06: 10:00:00 via INTRAVENOUS
  Filled 2015-12-06: qty 4

## 2015-12-06 MED ORDER — SODIUM CHLORIDE 0.9 % IV SOLN
80.0000 mg/m2 | Freq: Once | INTRAVENOUS | Status: AC
  Administered 2015-12-06: 168 mg via INTRAVENOUS
  Filled 2015-12-06: qty 28

## 2015-12-06 MED ORDER — SODIUM CHLORIDE 0.9 % IV SOLN
Freq: Once | INTRAVENOUS | Status: AC
  Administered 2015-12-06: 09:00:00 via INTRAVENOUS

## 2015-12-06 MED ORDER — FAMOTIDINE IN NACL 20-0.9 MG/50ML-% IV SOLN
20.0000 mg | Freq: Once | INTRAVENOUS | Status: AC
  Administered 2015-12-06: 20 mg via INTRAVENOUS

## 2015-12-06 MED ORDER — HEPARIN SOD (PORK) LOCK FLUSH 100 UNIT/ML IV SOLN
500.0000 [IU] | Freq: Once | INTRAVENOUS | Status: AC | PRN
  Administered 2015-12-06: 500 [IU]
  Filled 2015-12-06: qty 5

## 2015-12-06 MED ORDER — DIPHENHYDRAMINE HCL 50 MG/ML IJ SOLN
INTRAMUSCULAR | Status: AC
  Filled 2015-12-06: qty 1

## 2015-12-06 MED ORDER — SODIUM CHLORIDE 0.9% FLUSH
10.0000 mL | INTRAVENOUS | Status: DC | PRN
  Administered 2015-12-06: 10 mL
  Filled 2015-12-06: qty 10

## 2015-12-06 MED ORDER — DIPHENHYDRAMINE HCL 50 MG/ML IJ SOLN
25.0000 mg | Freq: Once | INTRAMUSCULAR | Status: AC
  Administered 2015-12-06: 10:00:00 via INTRAVENOUS

## 2015-12-06 NOTE — Patient Instructions (Signed)
[facility] Discharge Instructions for Patients Receiving Chemotherapy  Today you received the following chemotherapy agents Taxol   To help prevent nausea and vomiting after your treatment, we encourage you to take your nausea medication as directed.   If you develop nausea and vomiting that is not controlled by your nausea medication, call the clinic.   BELOW ARE SYMPTOMS THAT SHOULD BE REPORTED IMMEDIATELY:  *FEVER GREATER THAN 100.5 F  *CHILLS WITH OR WITHOUT FEVER  NAUSEA AND VOMITING THAT IS NOT CONTROLLED WITH YOUR NAUSEA MEDICATION  *UNUSUAL SHORTNESS OF BREATH  *UNUSUAL BRUISING OR BLEEDING  TENDERNESS IN MOUTH AND THROAT WITH OR WITHOUT PRESENCE OF ULCERS  *URINARY PROBLEMS  *BOWEL PROBLEMS  UNUSUAL RASH Items with * indicate a potential emergency and should be followed up as soon as possible.  Feel free to call the clinic you have any questions or concerns. The clinic phone number is [phone].  Please show the CHEMO ALERT CARD at check-in to the Emergency Department and triage nurse.   

## 2015-12-06 NOTE — Progress Notes (Signed)
Called and reported potassium of 3.1 today to Dr. Lindi Adie, patient to pick up prescription for potassium at pharmacy today. Verbalized understanding.

## 2015-12-12 ENCOUNTER — Encounter: Payer: Self-pay | Admitting: Hematology and Oncology

## 2015-12-12 NOTE — Progress Notes (Signed)
Sent notes again for herceptin per [facility]

## 2015-12-13 ENCOUNTER — Other Ambulatory Visit (HOSPITAL_BASED_OUTPATIENT_CLINIC_OR_DEPARTMENT_OTHER): Payer: Self-pay

## 2015-12-13 ENCOUNTER — Other Ambulatory Visit: Payer: Self-pay

## 2015-12-13 ENCOUNTER — Ambulatory Visit (HOSPITAL_BASED_OUTPATIENT_CLINIC_OR_DEPARTMENT_OTHER): Payer: Self-pay | Admitting: Hematology and Oncology

## 2015-12-13 ENCOUNTER — Ambulatory Visit (HOSPITAL_BASED_OUTPATIENT_CLINIC_OR_DEPARTMENT_OTHER): Payer: Self-pay

## 2015-12-13 ENCOUNTER — Encounter: Payer: Self-pay | Admitting: Hematology and Oncology

## 2015-12-13 DIAGNOSIS — C50211 Malignant neoplasm of upper-inner quadrant of right female breast: Secondary | ICD-10-CM

## 2015-12-13 DIAGNOSIS — C50812 Malignant neoplasm of overlapping sites of left female breast: Secondary | ICD-10-CM

## 2015-12-13 DIAGNOSIS — R040 Epistaxis: Secondary | ICD-10-CM

## 2015-12-13 DIAGNOSIS — Z5112 Encounter for antineoplastic immunotherapy: Secondary | ICD-10-CM

## 2015-12-13 DIAGNOSIS — C50312 Malignant neoplasm of lower-inner quadrant of left female breast: Secondary | ICD-10-CM

## 2015-12-13 DIAGNOSIS — Z5111 Encounter for antineoplastic chemotherapy: Secondary | ICD-10-CM

## 2015-12-13 DIAGNOSIS — C50811 Malignant neoplasm of overlapping sites of right female breast: Secondary | ICD-10-CM

## 2015-12-13 DIAGNOSIS — R197 Diarrhea, unspecified: Secondary | ICD-10-CM

## 2015-12-13 DIAGNOSIS — K625 Hemorrhage of anus and rectum: Secondary | ICD-10-CM

## 2015-12-13 DIAGNOSIS — R110 Nausea: Secondary | ICD-10-CM

## 2015-12-13 DIAGNOSIS — R51 Headache: Secondary | ICD-10-CM

## 2015-12-13 DIAGNOSIS — G620 Drug-induced polyneuropathy: Secondary | ICD-10-CM

## 2015-12-13 DIAGNOSIS — C50411 Malignant neoplasm of upper-outer quadrant of right female breast: Secondary | ICD-10-CM

## 2015-12-13 LAB — COMPREHENSIVE METABOLIC PANEL
ALBUMIN: 3.4 g/dL — AB (ref 3.5–5.0)
ALK PHOS: 67 U/L (ref 40–150)
ALT: 45 U/L (ref 0–55)
AST: 34 U/L (ref 5–34)
Anion Gap: 8 mEq/L (ref 3–11)
BILIRUBIN TOTAL: 0.7 mg/dL (ref 0.20–1.20)
BUN: 8.4 mg/dL (ref 7.0–26.0)
CO2: 21 mEq/L — ABNORMAL LOW (ref 22–29)
CREATININE: 0.7 mg/dL (ref 0.6–1.1)
Calcium: 9.4 mg/dL (ref 8.4–10.4)
Chloride: 109 mEq/L (ref 98–109)
EGFR: >90 mL/min/{1.73_m2} (ref >90)
GLUCOSE: 144 mg/dL — AB (ref 70–140)
Potassium: 3.4 mEq/L — ABNORMAL LOW (ref 3.5–5.1)
SODIUM: 138 meq/L (ref 136–145)
TOTAL PROTEIN: 6.7 g/dL (ref 6.4–8.3)

## 2015-12-13 LAB — CBC WITH DIFFERENTIAL/PLATELET
BASO%: 0.4 % (ref 0.0–2.0)
Basophils Absolute: 0 10*3/uL (ref 0.0–0.1)
EOS ABS: 0.1 10*3/uL (ref 0.0–0.5)
EOS%: 0.9 % (ref 0.0–7.0)
HCT: 35.2 % (ref 34.8–46.6)
HEMOGLOBIN: 12.1 g/dL (ref 11.6–15.9)
LYMPH#: 0.9 10*3/uL (ref 0.9–3.3)
LYMPH%: 15.5 % (ref 14.0–49.7)
MCH: 30.9 pg (ref 25.1–34.0)
MCHC: 34.4 g/dL (ref 31.5–36.0)
MCV: 90 fL (ref 79.5–101.0)
MONO#: 0.3 10*3/uL (ref 0.1–0.9)
MONO%: 5.9 % (ref 0.0–14.0)
NEUT%: 77.3 % — ABNORMAL HIGH (ref 38.4–76.8)
NEUTROS ABS: 4.4 10*3/uL (ref 1.5–6.5)
Platelets: 227 10*3/uL (ref 145–400)
RBC: 3.91 10*6/uL (ref 3.70–5.45)
RDW: 15.7 % — AB (ref 11.2–14.5)
WBC: 5.6 10*3/uL (ref 3.9–10.3)

## 2015-12-13 MED ORDER — HEPARIN SOD (PORK) LOCK FLUSH 100 UNIT/ML IV SOLN
500.0000 [IU] | Freq: Once | INTRAVENOUS | Status: AC | PRN
  Administered 2015-12-13: 500 [IU]
  Filled 2015-12-13: qty 5

## 2015-12-13 MED ORDER — SODIUM CHLORIDE 0.9 % IV SOLN
Freq: Once | INTRAVENOUS | Status: AC
  Administered 2015-12-13: 11:00:00 via INTRAVENOUS
  Filled 2015-12-13: qty 4

## 2015-12-13 MED ORDER — SODIUM CHLORIDE 0.9 % IV SOLN
60.0000 mg/m2 | Freq: Once | INTRAVENOUS | Status: AC
  Administered 2015-12-13: 126 mg via INTRAVENOUS
  Filled 2015-12-13: qty 21

## 2015-12-13 MED ORDER — DIPHENHYDRAMINE HCL 50 MG/ML IJ SOLN
25.0000 mg | Freq: Once | INTRAMUSCULAR | Status: AC
  Administered 2015-12-13: 25 mg via INTRAVENOUS

## 2015-12-13 MED ORDER — SODIUM CHLORIDE 0.9% FLUSH
10.0000 mL | INTRAVENOUS | Status: DC | PRN
  Administered 2015-12-13: 10 mL
  Filled 2015-12-13: qty 10

## 2015-12-13 MED ORDER — TRASTUZUMAB CHEMO 150 MG IV SOLR
6.0000 mg/kg | Freq: Once | INTRAVENOUS | Status: AC
  Administered 2015-12-13: 630 mg via INTRAVENOUS
  Filled 2015-12-13: qty 30

## 2015-12-13 MED ORDER — GABAPENTIN 100 MG PO CAPS: ORAL_CAPSULE | ORAL | 0 refills | Status: DC

## 2015-12-13 MED ORDER — FAMOTIDINE IN NACL 20-0.9 MG/50ML-% IV SOLN
20.0000 mg | Freq: Once | INTRAVENOUS | Status: AC
  Administered 2015-12-13: 20 mg via INTRAVENOUS

## 2015-12-13 MED ORDER — DIPHENHYDRAMINE HCL 50 MG/ML IJ SOLN
INTRAMUSCULAR | Status: AC
  Filled 2015-12-13: qty 1

## 2015-12-13 MED ORDER — DIPHENHYDRAMINE HCL 50 MG/ML IJ SOLN: 50.0000 mg | Freq: Once | INTRAMUSCULAR | Status: DC

## 2015-12-13 MED ORDER — ACETAMINOPHEN 325 MG PO TABS
650.0000 mg | ORAL_TABLET | Freq: Once | ORAL | Status: AC
  Administered 2015-12-13: 650 mg via ORAL

## 2015-12-13 MED ORDER — SODIUM CHLORIDE 0.9 % IV SOLN
Freq: Once | INTRAVENOUS | Status: AC
  Administered 2015-12-13: 11:00:00 via INTRAVENOUS

## 2015-12-13 MED ORDER — SODIUM CHLORIDE 0.9 % IV SOLN
420.0000 mg | Freq: Once | INTRAVENOUS | Status: AC
  Administered 2015-12-13: 420 mg via INTRAVENOUS
  Filled 2015-12-13: qty 14

## 2015-12-13 MED ORDER — ACETAMINOPHEN 325 MG PO TABS
ORAL_TABLET | ORAL | Status: AC
  Filled 2015-12-13: qty 2

## 2015-12-13 MED ORDER — FAMOTIDINE IN NACL 20-0.9 MG/50ML-% IV SOLN
INTRAVENOUS | Status: AC
  Filled 2015-12-13: qty 50

## 2015-12-13 NOTE — Progress Notes (Signed)
Discussed with Dr. Geralyn Flash nurse Eulas Post RN, ok to proceed without current Echo, last completed 08/24/2015.  Potassium improved from 7 days ago from 3.1 to 3.4, confirmed patient still taking Potassium supplements and will continue to take.  Two Benadryl doses listed for treatment today, pending direction on which dosing to proceed with for treatment from Dr. Lindi Adie.

## 2015-12-13 NOTE — Progress Notes (Signed)
Per Dr. Lindi Adie give Benadryl 25 mg IV as pre med

## 2015-12-13 NOTE — Progress Notes (Signed)
Patient Care Team: No Pcp Per Patient as PCP - General (General Practice)  SUMMARY OF ONCOLOGIC HISTORY:   Bilateral breast cancer ([place]) (Resolved)   08/16/2015 Mammogram    Left breast LIQ 4.2 cm, 4 mm oval mass 12:00, 1.2 cm mass left UOQ, enlarged left axillary lymph node     08/16/2015 Mammogram    Right breast UIQ: 3.9 cm irregular mass, 1.5 cm mass in the right LOQ, 9 mm mass right UOQ, 6 mm mass right LIQ, enlarged lymph node Rt axilla     08/20/2015 Initial Diagnosis    Right breast biopsy 1:00: IDC grade 2 ER 95%, PR 2%, Ki 67 15%; HER-2 negative ratio 1.04; 10:00: IDC grade 2-3 dissimilar to 1:00 biopsy 10:00: ER 95%, PR 0%, K67: 25%, HER-2 positive ratio 6.42 right axillary lymph node positive for metastatic cancer     08/20/2015 Procedure    Left biopsy 8:00: IDC with DCIS, grade 2; ER 95%, PR 95%, Ki-67 30%, HER-2 negative ratio 1.2; 12:00: IDC with DCIS, grade 1 ER 95%, PR 95%, Ki-67 10% HER-2 negative ratio 1.14 not similar to this biopsy, left axillary lymph node positive,      09/13/2015 -  Neo-Adjuvant Chemotherapy    Neoadjuvant dose dense Adriamycin and Cytoxan followed by Taxol Herceptin Perjeta      CHIEF COMPLIANT: Taxol Herceptin Perjeta  INTERVAL HISTORY: Felicia [name] is a 55 year old with above-mentioned history of right breast cancer currently neoadjuvant chemotherapy and today is cycle 7 of Taxol and cycle 3 of Herceptin Perjeta. Herceptin Perjeta are being given once every 3 weeks. Taxol is being given weekly. She complains of neuropathy in the hands and feet. She has had decent appetite and eating quite well.  REVIEW OF SYSTEMS:   Constitutional: Denies fevers, chills or abnormal weight loss Eyes: Denies blurriness of vision Ears, nose, mouth, throat, and face: Denies mucositis or sore throat Respiratory: Denies cough, dyspnea or wheezes Cardiovascular: Denies palpitation, chest discomfort Gastrointestinal:  Denies nausea, heartburn or change in bowel  habits Skin: Denies abnormal skin rashes Lymphatics: Denies new lymphadenopathy or easy bruising Neurological: Neuropathy in the fingers and toes Behavioral/Psych: Mood is stable, no new changes  Extremities: No lower extremity edema  All other systems were reviewed with the patient and are negative.  I have reviewed the past medical history, past surgical history, social history and family history with the patient and they are unchanged from previous note.  ALLERGIES:  has No Known Allergies.  MEDICATIONS:  Current Outpatient Prescriptions  Medication Sig Dispense Refill  . dexamethasone (DECADRON) 4 MG tablet Take 4 mg by mouth daily. Start day after chemo.  Take 1 tablet daily for 2 days after chemo.    Marland Kitchen HYDROcodone-acetaminophen (NORCO) 10-325 MG tablet Take 1 tablet by mouth every 6 (six) hours as needed. 10 tablet 0  . lidocaine-prilocaine (EMLA) cream Apply 1 application topically as needed (Apply 2- hours prior to access of port).    . LORazepam (ATIVAN) 0.5 MG tablet Take 0.5 mg by mouth every 6 (six) hours as needed for anxiety.    . magic mouthwash w/lidocaine SOLN Take 5 mLs by mouth 3 (three) times daily as needed for mouth pain. 100 mL 0  . naproxen (NAPROSYN) 500 MG tablet Take 500 mg by mouth 2 (two) times daily with a meal.    . potassium chloride SA (K-DUR,KLOR-CON) 20 MEQ tablet Take 1 tablet (20 mEq total) by mouth daily. 30 tablet 0  . prochlorperazine (COMPAZINE) 10 MG  tablet Take 10 mg by mouth every 6 (six) hours as needed for nausea or vomiting.     No current facility-administered medications for this visit.    Facility-Administered Medications Ordered in Other Visits  Medication Dose Route Frequency Provider Last Rate Last Dose  . diphenhydrAMINE (BENADRYL) injection 50 mg  50 mg Intravenous Once Nicholas Lose, MD      . famotidine (PEPCID) IVPB 20 mg premix  20 mg Intravenous Once Nicholas Lose, MD      . heparin lock flush 100 unit/mL  500 Units  Intracatheter Once PRN Nicholas Lose, MD      . PACLitaxel (TAXOL) 126 mg in sodium chloride 0.9 % 250 mL chemo infusion (</= 60m/m2)  60 mg/m2 (Treatment Plan Recorded) Intravenous Once VNicholas Lose MD      . pertuzumab (PERJETA) 420 mg in sodium chloride 0.9 % 250 mL chemo infusion  420 mg Intravenous Once VNicholas Lose MD      . sodium chloride flush (NS) 0.9 % injection 10 mL  10 mL Intracatheter PRN VNicholas Lose MD      . trastuzumab (HERCEPTIN) 630 mg in sodium chloride 0.9 % 250 mL chemo infusion  6 mg/kg (Treatment Plan Recorded) Intravenous Once VNicholas Lose MD        PHYSICAL EXAMINATION: ECOG PERFORMANCE STATUS: 1 - Symptomatic but completely ambulatory  Vitals:   12/13/15 1010  BP: 130/73  Pulse: 83  Resp: 19  Temp: 97.5 F (36.4 C)   Filed Weights   12/13/15 1010  Weight: 225 lb 9 oz (102.3 kg)    GENERAL:alert, no distress and comfortable SKIN: skin color, texture, turgor are normal, no rashes or significant lesions EYES: normal, Conjunctiva are pink and non-injected, sclera clear OROPHARYNX:no exudate, no erythema and lips, buccal mucosa, and tongue normal  NECK: supple, thyroid normal size, non-tender, without nodularity LYMPH:  no palpable lymphadenopathy in the cervical, axillary or inguinal LUNGS: clear to auscultation and percussion with normal breathing effort HEART: regular rate & rhythm and no murmurs and no lower extremity edema ABDOMEN:abdomen soft, non-tender and normal bowel sounds MUSCULOSKELETAL:no cyanosis of digits and no clubbing  NEURO: alert & oriented x 3 with fluent speech, grade 1-2 peripheral neuropathy EXTREMITIES: No lower extremity edema  LABORATORY DATA:  I have reviewed the data as listed   Chemistry      Component Value Date/Time   NA 138 12/13/2015 0956   K 3.4 (L) 12/13/2015 0956   CO2 21 (L) 12/13/2015 0956   BUN 8.4 12/13/2015 0956   CREATININE 0.7 12/13/2015 0956      Component Value Date/Time   CALCIUM 9.4 12/13/2015  0956   ALKPHOS 67 12/13/2015 0956   AST 34 12/13/2015 0956   ALT 45 12/13/2015 0956   BILITOT 0.70 12/13/2015 0956       Lab Results  Component Value Date   WBC 5.6 12/13/2015   HGB 12.1 12/13/2015   HCT 35.2 12/13/2015   MCV 90.0 12/13/2015   PLT 227 12/13/2015   NEUTROABS 4.4 12/13/2015     ASSESSMENT & PLAN:  Bilateral malignant neoplasm of overlapping sites of breast in female ([facility]  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%; HER-2 negative ratio 1.04; 10:00: IDC grade 2-3  dissimilar to 1:00 biopsy 10:00: ER 95%, PR 0%, K67: 25%, HER-2 positiveratio 6.42 right axillary lymph node positive for metastatic cancer (LN is Her 2 neg) --------------------------------------------------------------------------------- Left breast LIQ 08/20/2015: 4.2 cm, 4 mm oval mass 12:00, 1.2 cm mass left UOQ, enlarged left axillary lymph node, T2 N1 stage II B clinical stage  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 CT CAP and Bone scan 08/27/15: Bil Breast masses, Bil Axill LN, Bil lower lobe lung nodules indeterminate 7 mm  --------------------------------------------------------------------------------------------------------------------------------------- Treatment plan: 1. Neoadjuvant chemotherapy with either AC followed by THP (followed by CT chest to evaluate the lung nodules and repeat breast MRI) 2. Followed by bilateral mastectomies and lymph node surgery 3. Followed by adjuvant radiation 4. Followed by adjuvant antiestrogen  therapy ------------------------------------------------------------------------------------------------------------------------------------ Current treatment: weekly Taxol and every three-week Herceptin and Perjeta; today's cycle 7/12 Taxol and cycle 3 Herceptin and Perjeta  Echocardiogram 08/24/2015: EF 60-65%  Chemotherapy toxicities: 1. Mild nausea 2. Mild fatigue and headache 3. Mouth sores: Given a prescription for Magic mouthwash 4. Infusion reaction: With the first dose of Herceptin, got worse followed by Demerol which caused nausea and vomiting 5. Diarrhea due to Perjeta resolved after 1-2 days 5. 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 I decreased the dosage of Taxol. We'll monitor this very closely.  Return to clinic in 1 weeks for cycle 8/12 Taxol.   No orders of the defined types were placed in this encounter.  The patient has a good understanding of the overall plan. she agrees with it. she will call with any problems that may develop before the next visit here.   Rulon Eisenmenger, MD 12/13/15

## 2015-12-13 NOTE — Assessment & Plan Note (Signed)
Bilateral malignant neoplasm of overlapping sites of breast in female [facility]) 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%; HER-2 negative ratio 1.04; 10:00: IDC grade 2-3 dissimilar to 1:00 biopsy 10:00: ER 95%, PR 0%, K67: 25%, HER-2 positiveratio 6.42 right axillary lymph node positive for metastatic cancer (LN is Her 2 neg) --------------------------------------------------------------------------------- Left breast LIQ 08/20/2015: 4.2 cm, 4 mm oval mass 12:00, 1.2 cm mass left UOQ, enlarged left axillary lymph node, T2 N1 stage II B clinical stage  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 CT CAP and Bone scan 08/27/15: Bil Breast masses, Bil Axill LN, Bil lower lobe lung nodules indeterminate 7 mm  --------------------------------------------------------------------------------------------------------------------------------------- Treatment plan: 1. Neoadjuvant chemotherapy with either AC followed by THP (followed by CT chest to evaluate the lung nodules and repeat breast MRI) 2. Followed by bilateral mastectomies and lymph node surgery 3. Followed by adjuvant radiation 4. Followed by adjuvant antiestrogen therapy ------------------------------------------------------------------------------------------------------------------------------------ Current treatment: weekly Taxol and every three-week Herceptin and Perjeta; today's cycle 7/12 Taxol and cycle 3 Herceptin and Perjeta  Echocardiogram 08/24/2015: EF 60-65%  Chemotherapy toxicities: 1. Mild nausea 2. Mild fatigue and headache 3.  Mouth sores: Given a prescription for Magic mouthwash 4. Infusion reaction: With the first dose of Herceptin, got worse followed by Demerol which caused nausea and vomiting 5. Diarrhea due to Perjeta resolved after 1-2 days 5. Nosebleeds and rectal bleed related to diarrhea  Return to clinic in 3 weeks for cycle 10/12 T and cycle 4 HP

## 2015-12-13 NOTE — Patient Instructions (Signed)
[facility] Discharge Instructions for Patients Receiving Chemotherapy  Today you received the following chemotherapy agents Herceptin, Perjeta, and Taxol  To help prevent nausea and vomiting after your treatment, we encourage you to take your nausea medication as directed.   If you develop nausea and vomiting that is not controlled by your nausea medication, call the clinic.   BELOW ARE SYMPTOMS THAT SHOULD BE REPORTED IMMEDIATELY:  *FEVER GREATER THAN 100.5 F  *CHILLS WITH OR WITHOUT FEVER  NAUSEA AND VOMITING THAT IS NOT CONTROLLED WITH YOUR NAUSEA MEDICATION  *UNUSUAL SHORTNESS OF BREATH  *UNUSUAL BRUISING OR BLEEDING  TENDERNESS IN MOUTH AND THROAT WITH OR WITHOUT PRESENCE OF ULCERS  *URINARY PROBLEMS  *BOWEL PROBLEMS  UNUSUAL RASH Items with * indicate a potential emergency and should be followed up as soon as possible.  Feel free to call the clinic you have any questions or concerns. The clinic phone number is [phone].  Please show the CHEMO ALERT CARD at check-in to the Emergency Department and triage nurse.  Trastuzumab injection for infusion Qu es este medicamento? El TRASTUZUMAB es un anticuerpo monoclonal. Sunday Spillers para tratar el cncer de mama y de [place]. Este medicamento puede ser utilizado para otros usos; si tiene alguna pregunta consulte con su proveedor de atencin mdica o con su farmacutico. Qu le debo informar a mi profesional de la salud antes de tomar este medicamento? Necesita saber si usted presenta alguno de los siguientes problemas o situaciones: -enfermedad cardiaca -insuficiencia cardiaca -infeccin (especialmente infecciones virales, como la varicela o el herpes) -enfermedad pulmonar o respiratoria, como asma -radioterapia reciente o continuada -una reaccin alrgica o inusual al trastuzumab, al alcohol benclico, a otros medicamentos, alimentos, colorantes o conservantes -si est embarazada o buscando quedar  embarazada -si est amamantando a un beb Cmo debo utilizar este medicamento? Este medicamento se administra mediante infusin por va intravenosa. Lo administra un profesional de la salud calificado en un hospital o en un entorno clnico. Hable con su pediatra para informarse acerca del uso de este medicamento en nios. Este medicamento no est aprobado para uso en nios. Sobredosis: Pngase en contacto inmediatamente con un centro toxicolgico o una sala de urgencia si usted cree que haya tomado demasiado medicamento. ATENCIN: [employer] es solo para usted. No comparta este medicamento con nadie. Qu sucede si me olvido de una dosis? Es importante no olvidar ninguna dosis. Informe a su mdico o a su profesional de la salud si no puede asistir a Marketing executive. Qu puede interactuar con este medicamento? -doxorubicina -warfarina Puede ser que esta lista no menciona todas las posibles interacciones. Informe a su profesional de [employer] de [employer] productos a base de hierbas, medicamentos de [place] o suplementos nutritivos que est tomando. Si usted fuma, consume bebidas alcohlicas o si utiliza drogas ilegales, indqueselo tambin a su profesional de [employer]. Algunas sustancias pueden interactuar con su medicamento. A qu debo estar atento al usar [employer]? Visite a su mdico para chequear su evolucin peridicamente. Si presenta algn efecto secundario, infrmelo. Sin embargo, contine con el tratamiento aun si se siente enfermo, a menos que su mdico le indique que lo suspenda. Consulte a su mdico o a su profesional de la salud si tiene fiebre, escalofros, dolor de garganta, o cualquier otro sntoma de resfro o gripe. No se trate usted mismo. Trate de no acercarse a personas que estn enfermas. Es posible que tenga [place], escalofros y temblores durante su primera infusin. Estos efectos generalmente son  leves y pueden tratarse con otros medicamentos. Informe cualquier  efecto secundario durante la infusin a su profesional de [employer]. La fiebre y los escalofros por lo general no suceden con las infusiones posteriores. No debe quedar embarazada mientras est tomando este medicamento o por 7 meses despus de dejar de usarlo. Las mujeres deben informar a su mdico si estn buscando quedar embarazadas o si creen que estn embarazadas. Las mujeres con la posibilidad de Warehouse manager nios deben tener una prueba de embarazo negativa antes de Corporate investment banker a tomar este medicamento. Existe la posibilidad de que ocurran efectos secundarios graves a un beb sin nacer. Para ms informacin hable con su profesional de la salud o su farmacutico. No debe amamantar a un beb mientras est tomando este medicamento o por 7 meses despus de dejar de usarlo. Las [employer] deben usar un mtodo anticonceptivo eficaz con este medicamento. Qu efectos secundarios puedo tener al [employer] este medicamento? Efectos secundarios que debe informar a su mdico o a Scientist, water quality de la salud tan pronto como sea posible: -problemas respiratorios -Journalist, newspaper o palpitaciones -tos -mareos o desmayos -fiebre o escalofros, dolor de garganta -erupcin cutnea, picazn o urticarias -hinchazn de los tobillos o las piernas -cansancio o debilidad inusual Efectos secundarios que, por lo general, no requieren Psychologist, prison and probation services (debe informarlos a su mdico u otro profesional de la salud si persisten o si son molestos): -prdida del apetito -dolor de cabeza -dolores musculares -nuseas Puede ser que esta lista no menciona todos los posibles efectos secundarios. Comunquese a su mdico por asesoramiento mdico [employer]. Usted puede informar los efectos secundarios a la FDA por telfono al 1-800-FDA-1088. Dnde debo guardar mi medicina? Este medicamento se administra en hospitales o clnicas y no necesitar guardarlo en su domicilio. ATENCIN: Este folleto es un resumen. Puede ser que no  cubra toda la posible informacin. Si usted tiene preguntas acerca de esta medicina, consulte con su mdico, su farmacutico o su profesional de Radiographer, therapeutic.    2016, Elsevier/Gold Standard. (2014-09-14 00:00:00)  Pertuzumab injection Qu es este medicamento? El PERTUZUMAB es un anticuerpo monoclonal que acta sobre una protena llamada HER2. HER2 se encuentra en algunos cnceres de mama. Este medicamento puede detener el crecimiento de las clulas cancerosas. Este [employer] Data se puede usar con otros tratamientos para Management consultant. Este medicamento puede ser utilizado para otros usos; si tiene alguna pregunta consulte con su proveedor de atencin mdica o con su farmacutico. Qu le debo informar a mi profesional de la salud antes de tomar este medicamento? Necesita saber si usted presenta alguno de los siguientes problemas o situaciones: enfermedad cardiaca insuficiencia cardiaca alta presin sangunea antecedentes de pulso cardiaca irregular radioterapia reciente o continuada una reaccin alrgica o inusual al pertuzumab, a otros medicamentos, alimentos, colorantes o conservantes si est embarazada o buscando quedar embarazada si est amamantando a un beb Cmo debo utilizar este medicamento? Este medicamento se administra mediante infusin por va intravenosa. Lo administra un profesional de Radiographer, therapeutic en un hospital o en un entorno clnico. Hable con su pediatra para informarse acerca del uso de este medicamento en nios. Puede requerir atencin especial. Sobredosis: Pngase en contacto inmediatamente con un centro toxicolgico o una sala de urgencia si usted cree que haya tomado demasiado medicamento. ATENCIN: [employer] es solo para usted. No comparta este medicamento con nadie. Qu sucede si me olvido de una dosis? Es importante no olvidar ninguna dosis. Informe a su mdico o a su profesional de la salud si  no puede asistir a una cita. Qu puede interactuar con este medicamento? No se  esperan interacciones. Puede ser que esta lista no menciona todas las posibles interacciones. Informe a su profesional de [employer] de [employer] productos a base de hierbas, medicamentos de [place] o suplementos nutritivos que est tomando. Si usted fuma, consume bebidas alcohlicas o si utiliza drogas ilegales, indqueselo tambin a su profesional de [employer]. Algunas sustancias pueden interactuar con su medicamento. A qu debo estar atento al usar [employer]? Se supervisar su estado de salud atentamente mientras reciba este medicamento. Si presenta algn efecto secundario, infrmelo. Sin embargo, contine con el tratamiento aun si se siente enfermo, a menos que su mdico le indique que lo suspenda. No debe quedar embarazada mientras est tomando este medicamento o por 7 meses despus de dejar de usarlo. Las mujeres deben informar a su mdico si estn buscando quedar embarazadas o si creen que estn embarazadas. Las mujeres con la posibilidad de Warehouse manager nios deben tener una prueba de embarazo negativa antes de Corporate investment banker a tomar este medicamento. Existe la posibilidad de que ocurran efectos secundarios graves a un beb sin nacer. Para ms informacin hable con su profesional de la salud o su farmacutico. No debe amamantar a un beb mientras est tomando este medicamento o por 7 meses despus de dejar de usarlo. Las [employer] deben usar un mtodo anticonceptivo eficaz con este medicamento. Consulte a su mdico o a su profesional de la salud si tiene fiebre, escalofros, dolor de garganta, o cualquier otro sntoma de resfro o gripe. No se trate usted mismo. Trate de no acercarse a personas que estn enfermas. Es posible que tenga [place], escalofros y dolor de cabeza durante la infusin. Informe cualquier efecto secundario durante la infusin a su profesional de [employer]. Qu efectos secundarios puedo tener al [employer] este medicamento? Efectos secundarios que debe informar a su mdico o a Water quality scientist de la salud tan pronto como sea posible: Barista o palpitaciones mareos sensacin de Corporate investment banker o aturdimiento fiebre o escalofros erupcin cutnea, picazn o urticarias dolor de garganta hinchazn de la cara, labio o lengua hinchazn de las piernas o tobillos cansancio o debilidad inusual Efectos secundarios que, por lo general, no requieren atencin mdica (debe informarlos a su mdico o a su profesional de la salud si persisten o si son molestos): diarrea cada del cabello nuseas, vmito somnolencia Puede ser que esta lista no menciona todos los posibles efectos secundarios. Comunquese a su mdico por asesoramiento mdico [employer]. Usted puede informar los efectos secundarios a la FDA por telfono al 1-800-FDA-1088. Dnde debo guardar mi medicina? Este medicamento se administra en hospitales o clnicas y no necesitar guardarlo en su domicilio. ATENCIN: Este folleto es un resumen. Puede ser que no cubra toda la posible informacin. Si usted tiene preguntas acerca de esta medicina, consulte con su mdico, su farmacutico o su profesional de Radiographer, therapeutic.    2016, Elsevier/Gold Standard. (2014-09-14 00:00:00)   Paclitaxel injection Qu es este medicamento? El PACLITAXEL es un agente quimioteraputico. Este medicamento acta sobre las clulas que se dividen rpidamente, como las clulas cancerosas, y finalmente provoca la muerte de estas clulas. Se utiliza en el tratamiento del cncer de [place], mama y otros tipos de cncer. Este medicamento puede ser utilizado para otros usos; si tiene alguna pregunta consulte con su proveedor de atencin mdica o con su farmacutico. Qu le debo informar a mi profesional de la salud antes de  tomar este medicamento? Necesita saber si usted presenta alguno de los siguientes problemas o situaciones: -trastornos sanguneos -pulso cardiaco irregular -infeccin (especialmente infecciones virales,  como varicela o herpes) -enfermedad heptica -radioterapia reciente o continua -una reaccin alrgica o inusual al paclitaxel, alcohol, aceite de ricino polioxietilado, otros agentes quimioteraputicos, medicamentos, alimentos, colorantes o conservantes -si est embarazada o buscando quedar embarazada -si est amamantando a un beb Cmo debo utilizar este medicamento? Este medicamento se administra mediante infusin por va intravenosa. Lo administra un profesional de la salud calificado en un hospital o en un entorno clnico. Hable con su pediatra para informarse acerca del uso de este medicamento en nios. Puede requerir atencin especial. Sobredosis: Pngase en contacto inmediatamente con un centro toxicolgico o una sala de urgencia si usted cree que haya tomado demasiado medicamento. ATENCIN: [employer] es solo para usted. No comparta este medicamento con nadie. Qu sucede si me olvido de una dosis? Es importante no olvidar ninguna dosis. Informe a su mdico o a su profesional de la salud si no puede asistir a Photographer. Qu puede interactuar con este medicamento? No tome esta medicina con ninguno de los siguientes medicamentos: -disulfiram -metronidazol Esta medicina tambin puede interactuar con los siguientes medicamentos: -ciclosporina -diazepam -quetoconazol -medicamentos para incrementar los conteos sanguneos, tales como filgrastim, pegfilgrastim, sargramostim -otros agentes quimioteraputicos, tales como cisplatino, doxorrubicina, epirubicina, etopsido, teniposida, vincristina -quinidina -testosterona -vacunas -verapamilo Consulte a su mdico o a su profesional de la salud antes de tomar cualquiera de los siguientes medicamentos: -acetaminofeno -aspirina -ibuprofeno -quetoprofeno -naproxeno Puede ser que esta lista no menciona todas las posibles interacciones. Informe a su profesional de [employer]	[place] de [employer] productos a base de hierbas, medicamentos de [place] o suplementos nutritivos que est tomando. Si usted fuma, consume bebidas alcohlicas o si utiliza drogas ilegales, indqueselo tambin a su profesional de [employer]	[place]. Algunas sustancias pueden interactuar con su medicamento. A qu debo estar atento al usar [employer]? Se supervisar su estado de salud atentamente mientras reciba este medicamento. Tendr que hacerse anlisis de sangre importantes mientras est tomando este medicamento. Este medicamento puede hacerle sentir un Nurse, mental health. Esto es normal ya que la quimioterapia afecta tanto a las clulas sanas como a las clulas cancerosas. Si presenta alguno de los [employer], infrmelos. Sin embargo, contine con el tratamiento aun si se siente enfermo, a menos que su mdico le indique que lo suspenda. Este medicamento puede causar Chief of Staff graves. Para reducir su riesgo necesitar tomar otro(s) medicamento(s) antes del tratamiento con este medicamento. En algunos casos, podr recibir [employer] para ayudarlo con los efectos secundarios. Siga las instrucciones para usarlos. Consulte a su mdico o a su profesional de la salud si tiene fiebre, escalofros, dolor de garganta o cualquier otro sntoma de resfro o gripe. No se trate usted mismo. Este medicamento puede reducir la capacidad del cuerpo para combatir infecciones. Trate de no acercarse a personas que estn enfermas. [employer] puede aumentar el riesgo de magulladuras o sangrado. Consulte a su mdico o a su profesional de la salud si observa sangrados inusuales. Proceda con cuidado al cepillar sus dientes, usar hilo dental o Risk manager palillos para los dientes, ya que puede contraer una infeccin o Therapist, art con mayor facilidad. Si se somete a algn tratamiento dental, informe a su dentista que est [employer]. Evite tomar productos que contienen aspirina, acetaminofeno, ibuprofeno, naproxeno o quetoprofeno a menos que as lo indique  su mdico. Estos productos pueden disimular la fiebre. No debe quedar  embarazada mientras recibe Coca[employer]Cola. Las mujeres deben informar a su mdico si estn buscando quedar embarazadas o si creen que estn embarazadas. Existe la posibilidad de que ocurran efectos secundarios graves a un beb sin nacer. Para ms informacin hable con su profesional de la salud o su farmacutico. No debe amamantar a un beb mientras est tomando este medicamento. Para los hombres, se desaconseja tener nios mientras reciben Coca[employer]a. Qu efectos secundarios puedo tener al [employer] este medicamento? Efectos secundarios que debe informar a su mdico o a Barrister's clerk de la salud tan pronto como sea posible: Chief of Staff, como erupcin cutnea, picazn o urticarias, hinchazn de la cara, los labios o la lengua conteos sanguneos bajos - este frmaco puede reducir la cantidad de glbulos blancos, glbulos rojos y plaquetas. Su riesgo de infeccin y [place]. signos de infeccin - fiebre o escalofros, tos, dolor de garganta, dolor o dificultad para orinar signos de reduccin de plaquetas o sangrado - magulladuras, puntos rojos en la piel, heces de color oscuro o con aspecto alquitranado, sangrado por la nariz signos de reduccin de glbulos rojos - cansancio o debilidad inusual, desmayos, sensacin de mareo problemas respiratorios dolor en el pecho alta o baja presin sangunea llagas en la boca nuseas y vmito dolor, hinchazn, enrojecimiento o irritacin en el lugar de la [place], hormigueo, entumecimiento de las manos o los pies pulso cardiaco lento o irregular hinchazn de tobillos, pies, manosEfectos secundarios que, por lo general, no requieren atencin mdica (debe informarlos a su mdico o a su profesional de la salud si persisten o si son molestos): dolor de huesos cada total del cabello, incluyendo el cabello de la cabeza, de las Between, el vello pbico, las cejas y las  pestaas cambios en el color de las uas de las manos diarrea aflojamiento de las uas de las manos prdida del apetito dolores musculares o articulares enrojecimiento de la piel sudoracin Puede ser que esta lista no menciona todos los posibles efectos secundarios. Comunquese a su mdico por asesoramiento mdico [employer]. Usted puede informar los efectos secundarios a la FDA por telfono al 1-800-FDA-1088. Dnde debo guardar mi medicina? Este medicamento se administra en hospitales o clnicas y no necesitar guardarlo en su domicilio. ATENCIN: Este folleto es un resumen. Puede ser que no cubra toda la posible informacin. Si usted tiene preguntas acerca de esta medicina, consulte con su mdico, su farmacutico o su profesional de Technical sales engineer.    2016, Elsevier/Gold Standard. (2014-08-25 00:00:00)

## 2015-12-19 ENCOUNTER — Encounter (HOSPITAL_COMMUNITY): Payer: Self-pay | Admitting: Pharmacist

## 2015-12-20 ENCOUNTER — Encounter: Payer: Self-pay | Admitting: *Deleted

## 2015-12-20 ENCOUNTER — Ambulatory Visit: Payer: Self-pay

## 2015-12-20 ENCOUNTER — Ambulatory Visit (HOSPITAL_BASED_OUTPATIENT_CLINIC_OR_DEPARTMENT_OTHER): Payer: Self-pay | Admitting: Hematology and Oncology

## 2015-12-20 ENCOUNTER — Encounter: Payer: Self-pay | Admitting: Hematology and Oncology

## 2015-12-20 ENCOUNTER — Ambulatory Visit (HOSPITAL_BASED_OUTPATIENT_CLINIC_OR_DEPARTMENT_OTHER): Payer: Self-pay

## 2015-12-20 ENCOUNTER — Other Ambulatory Visit (HOSPITAL_BASED_OUTPATIENT_CLINIC_OR_DEPARTMENT_OTHER): Payer: Self-pay

## 2015-12-20 DIAGNOSIS — Z5111 Encounter for antineoplastic chemotherapy: Secondary | ICD-10-CM

## 2015-12-20 DIAGNOSIS — G620 Drug-induced polyneuropathy: Secondary | ICD-10-CM

## 2015-12-20 DIAGNOSIS — C50811 Malignant neoplasm of overlapping sites of right female breast: Secondary | ICD-10-CM

## 2015-12-20 DIAGNOSIS — C50211 Malignant neoplasm of upper-inner quadrant of right female breast: Secondary | ICD-10-CM

## 2015-12-20 DIAGNOSIS — C50812 Malignant neoplasm of overlapping sites of left female breast: Secondary | ICD-10-CM

## 2015-12-20 DIAGNOSIS — C50312 Malignant neoplasm of lower-inner quadrant of left female breast: Secondary | ICD-10-CM

## 2015-12-20 DIAGNOSIS — C50411 Malignant neoplasm of upper-outer quadrant of right female breast: Secondary | ICD-10-CM

## 2015-12-20 DIAGNOSIS — K2970 Gastritis, unspecified, without bleeding: Secondary | ICD-10-CM

## 2015-12-20 LAB — COMPREHENSIVE METABOLIC PANEL
ALBUMIN: 3.4 g/dL — AB (ref 3.5–5.0)
ALK PHOS: 67 U/L (ref 40–150)
ALT: 42 U/L (ref 0–55)
ANION GAP: 6 meq/L (ref 3–11)
AST: 31 U/L (ref 5–34)
BILIRUBIN TOTAL: 0.76 mg/dL (ref 0.20–1.20)
BUN: 10.7 mg/dL (ref 7.0–26.0)
CO2: 23 mEq/L (ref 22–29)
Calcium: 9.5 mg/dL (ref 8.4–10.4)
Chloride: 108 mEq/L (ref 98–109)
Creatinine: 0.7 mg/dL (ref 0.6–1.1)
GLUCOSE: 107 mg/dL (ref 70–140)
Potassium: 3.4 mEq/L — ABNORMAL LOW (ref 3.5–5.1)
Sodium: 137 mEq/L (ref 136–145)
TOTAL PROTEIN: 6.8 g/dL (ref 6.4–8.3)

## 2015-12-20 LAB — CBC WITH DIFFERENTIAL/PLATELET
BASO%: 0.3 % (ref 0.0–2.0)
BASOS ABS: 0 10*3/uL (ref 0.0–0.1)
EOS ABS: 0 10*3/uL (ref 0.0–0.5)
EOS%: 0.7 % (ref 0.0–7.0)
HCT: 36.2 % (ref 34.8–46.6)
HGB: 12.3 g/dL (ref 11.6–15.9)
LYMPH%: 14.1 % (ref 14.0–49.7)
MCH: 30.8 pg (ref 25.1–34.0)
MCHC: 34 g/dL (ref 31.5–36.0)
MCV: 90.7 fL (ref 79.5–101.0)
MONO#: 0.7 10*3/uL (ref 0.1–0.9)
MONO%: 11.3 % (ref 0.0–14.0)
NEUT%: 73.6 % (ref 38.4–76.8)
NEUTROS ABS: 4.5 10*3/uL (ref 1.5–6.5)
Platelets: 226 10*3/uL (ref 145–400)
RBC: 3.99 10*6/uL (ref 3.70–5.45)
RDW: 15.3 % — AB (ref 11.2–14.5)
WBC: 6.1 10*3/uL (ref 3.9–10.3)
lymph#: 0.9 10*3/uL (ref 0.9–3.3)

## 2015-12-20 MED ORDER — DIPHENHYDRAMINE HCL 50 MG/ML IJ SOLN
25.0000 mg | Freq: Once | INTRAMUSCULAR | Status: AC
  Administered 2015-12-20: 25 mg via INTRAVENOUS

## 2015-12-20 MED ORDER — DIPHENHYDRAMINE HCL 50 MG/ML IJ SOLN
INTRAMUSCULAR | Status: AC
  Filled 2015-12-20: qty 1

## 2015-12-20 MED ORDER — SODIUM CHLORIDE 0.9% FLUSH
10.0000 mL | INTRAVENOUS | Status: DC | PRN
  Administered 2015-12-20: 10 mL
  Filled 2015-12-20: qty 10

## 2015-12-20 MED ORDER — FAMOTIDINE IN NACL 20-0.9 MG/50ML-% IV SOLN
20.0000 mg | Freq: Once | INTRAVENOUS | Status: AC
  Administered 2015-12-20: 20 mg via INTRAVENOUS

## 2015-12-20 MED ORDER — SODIUM CHLORIDE 0.9 % IV SOLN
50.0000 mg/m2 | Freq: Once | INTRAVENOUS | Status: AC
  Administered 2015-12-20: 108 mg via INTRAVENOUS
  Filled 2015-12-20: qty 18

## 2015-12-20 MED ORDER — SODIUM CHLORIDE 0.9 % IV SOLN
Freq: Once | INTRAVENOUS | Status: AC
  Administered 2015-12-20: 13:00:00 via INTRAVENOUS

## 2015-12-20 MED ORDER — HEPARIN SOD (PORK) LOCK FLUSH 100 UNIT/ML IV SOLN
500.0000 [IU] | Freq: Once | INTRAVENOUS | Status: AC | PRN
Start: 2015-12-20 — End: 2015-12-20
  Administered 2015-12-20: 500 [IU]
  Filled 2015-12-20: qty 5

## 2015-12-20 MED ORDER — SODIUM CHLORIDE 0.9 % IV SOLN
Freq: Once | INTRAVENOUS | Status: AC
  Administered 2015-12-20: 13:00:00 via INTRAVENOUS
  Filled 2015-12-20: qty 4

## 2015-12-20 MED ORDER — FAMOTIDINE IN NACL 20-0.9 MG/50ML-% IV SOLN
INTRAVENOUS | Status: AC
  Filled 2015-12-20: qty 50

## 2015-12-20 NOTE — Progress Notes (Signed)
Patient Care Team: No Pcp Per Patient as PCP - General (General Practice)  DIAGNOSIS: No matching staging information was found for the patient.  SUMMARY OF ONCOLOGIC HISTORY:   Bilateral breast cancer ([place])   08/16/2015 Mammogram    Left breast LIQ 4.2 cm, 4 mm oval mass 12:00, 1.2 cm mass left UOQ, enlarged left axillary lymph node      08/16/2015 Mammogram    Right breast UIQ: 3.9 cm irregular mass, 1.5 cm mass in the right LOQ, 9 mm mass right UOQ, 6 mm mass right LIQ, enlarged lymph node Rt axilla      08/20/2015 Initial Diagnosis    Right breast biopsy 1:00: IDC grade 2 ER 95%, PR 2%, Ki 67 15%; HER-2 negative ratio 1.04; 10:00: IDC grade 2-3 dissimilar to 1:00 biopsy 10:00: ER 95%, PR 0%, K67: 25%, HER-2 positive ratio 6.42 right axillary lymph node positive for metastatic cancer      08/20/2015 Procedure    Left biopsy 8:00: IDC with DCIS, grade 2; ER 95%, PR 95%, Ki-67 30%, HER-2 negative ratio 1.2; 12:00: IDC with DCIS, grade 1 ER 95%, PR 95%, Ki-67 10% HER-2 negative ratio 1.14 not similar to this biopsy, left axillary lymph node positive,       09/13/2015 -  Neo-Adjuvant Chemotherapy    Neoadjuvant dose dense Adriamycin and Cytoxan followed by Taxol Herceptin Perjeta       CHIEF COMPLIANT: Cycle 8 of Taxol, Herceptin Perjeta every 3 weeks  INTERVAL HISTORY: Felicia [name] is a 55 year old with above-mentioned history of bilateral breast cancer. Neoadjuvant chemotherapy and today is cycle 8 of Taxol. She has noticed some sensitivity of the tips of the fingers but denies any pain or tingling or numbness. She denies any nausea or vomiting. She does have gastritis. Denies any constipation.  REVIEW OF SYSTEMS:   Constitutional: Denies fevers, chills or abnormal weight loss Eyes: Denies blurriness of vision Ears, nose, mouth, throat, and face: Denies mucositis or sore throat Respiratory: Denies cough, dyspnea or wheezes Cardiovascular: Denies palpitation, chest  discomfort Gastrointestinal:  Denies nausea, heartburn or change in bowel habits Skin: Denies abnormal skin rashes Lymphatics: Denies new lymphadenopathy or easy bruising Neurological:Denies numbness, tingling or new weaknesses Behavioral/Psych: Mood is stable, no new changes  Extremities: No lower extremity edema  All other systems were reviewed with the patient and are negative.  I have reviewed the past medical history, past surgical history, social history and family history with the patient and they are unchanged from previous note.  ALLERGIES:  has No Known Allergies.  MEDICATIONS:  Current Outpatient Prescriptions  Medication Sig Dispense Refill  . dexamethasone (DECADRON) 4 MG tablet Take 4 mg by mouth daily. Start day after chemo.  Take 1 tablet daily for 2 days after chemo.    . gabapentin (NEURONTIN) 100 MG capsule Take 1 capsule ('100mg'$ ) by mouth three times per day for 1 week.  Increase to 2 capsules ('200mg'$ ) by mouth three times per day for 1 week. Then increase to 3 capsules ('300mg'$ ) by mouth three times per day. 130 capsule 0  . HYDROcodone-acetaminophen (NORCO) 10-325 MG tablet Take 1 tablet by mouth every 6 (six) hours as needed. 10 tablet 0  . lidocaine-prilocaine (EMLA) cream Apply 1 application topically as needed (Apply 2- hours prior to access of port).    . LORazepam (ATIVAN) 0.5 MG tablet Take 0.5 mg by mouth every 6 (six) hours as needed for anxiety.    . magic mouthwash w/lidocaine SOLN Take 5  mLs by mouth 3 (three) times daily as needed for mouth pain. 100 mL 0  . naproxen (NAPROSYN) 500 MG tablet Take 500 mg by mouth 2 (two) times daily with a meal.    . potassium chloride SA (K-DUR,KLOR-CON) 20 MEQ tablet Take 1 tablet (20 mEq total) by mouth daily. 30 tablet 0  . prochlorperazine (COMPAZINE) 10 MG tablet Take 10 mg by mouth every 6 (six) hours as needed for nausea or vomiting.     No current facility-administered medications for this visit.     Facility-Administered Medications Ordered in Other Visits  Medication Dose Route Frequency Provider Last Rate Last Dose  . heparin lock flush 100 unit/mL  500 Units Intracatheter Once PRN Nicholas Lose, MD      . PACLitaxel (TAXOL) 108 mg in sodium chloride 0.9 % 250 mL chemo infusion (</= '80mg'$ /m2)  50 mg/m2 (Treatment Plan Recorded) Intravenous Once Nicholas Lose, MD 268 mL/hr at 12/20/15 1328 108 mg at 12/20/15 1328  . sodium chloride flush (NS) 0.9 % injection 10 mL  10 mL Intracatheter PRN Nicholas Lose, MD        PHYSICAL EXAMINATION: ECOG PERFORMANCE STATUS: 1 - Symptomatic but completely ambulatory  Vitals:   12/20/15 1040  BP: 132/79  Pulse: 90  Resp: 19  Temp: 97.5 F (36.4 C)   Filed Weights   12/20/15 1040  Weight: 224 lb 6.4 oz (101.8 kg)    GENERAL:alert, no distress and comfortable SKIN: skin color, texture, turgor are normal, no rashes or significant lesions EYES: normal, Conjunctiva are pink and non-injected, sclera clear OROPHARYNX:no exudate, no erythema and lips, buccal mucosa, and tongue normal  NECK: supple, thyroid normal size, non-tender, without nodularity LYMPH:  no palpable lymphadenopathy in the cervical, axillary or inguinal LUNGS: clear to auscultation and percussion with normal breathing effort HEART: regular rate & rhythm and no murmurs and no lower extremity edema ABDOMEN:abdomen soft, non-tender and normal bowel sounds MUSCULOSKELETAL:no cyanosis of digits and no clubbing  NEURO: alert & oriented x 3 with fluent speech, no focal motor/sensory deficits EXTREMITIES: No lower extremity edema   LABORATORY DATA:  I have reviewed the data as listed   Chemistry      Component Value Date/Time   NA 137 12/20/2015 1032   K 3.4 (L) 12/20/2015 1032   CO2 23 12/20/2015 1032   BUN 10.7 12/20/2015 1032   CREATININE 0.7 12/20/2015 1032      Component Value Date/Time   CALCIUM 9.5 12/20/2015 1032   ALKPHOS 67 12/20/2015 1032   AST 31 12/20/2015 1032    ALT 42 12/20/2015 1032   BILITOT 0.76 12/20/2015 1032       Lab Results  Component Value Date   WBC 6.1 12/20/2015   HGB 12.3 12/20/2015   HCT 36.2 12/20/2015   MCV 90.7 12/20/2015   PLT 226 12/20/2015   NEUTROABS 4.5 12/20/2015     ASSESSMENT & PLAN:  Bilateral breast cancer ([facility]) 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%; HER-2 negative ratio 1.04; 10:00: IDC grade 2-3 dissimilar to 1:00 biopsy 10:00: ER 95%, PR 0%, K67: 25%, HER-2 positiveratio 6.42 right axillary lymph node positive for metastatic cancer (LN is Her 2 neg) --------------------------------------------------------------------------------- Left breast LIQ 08/20/2015: 4.2 cm, 4  mm oval mass 12:00, 1.2 cm mass left UOQ, enlarged left axillary lymph node, T2 N1 stage II B clinical stage  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 CT CAP and Bone scan 08/27/15: Bil Breast masses, Bil Axill LN, Bil lower lobe lung nodules indeterminate 7 mm  --------------------------------------------------------------------------------------------------------------------------------------- Treatment plan: 1. Neoadjuvant chemotherapy with either AC followed by THP (followed by CT chest to evaluate the lung nodules and repeat breast MRI) 2. Followed by bilateral mastectomies and lymph node surgery 3. Followed by adjuvant radiation 4. Followed by adjuvant antiestrogen therapy ------------------------------------------------------------------------------------------------------------------------------------ Current treatment: weekly  Taxol and every three-week Herceptin and Perjeta; today's cycle 8/12Taxol and cycle 3 Herceptin and Perjeta Echocardiogram 08/24/2015: EF 60-65%  Chemotherapy toxicities: 1. Mild nausea 2. Mild fatigue and headache 3. Mouth sores: Given a prescription for Magic mouthwash 4. Infusion reaction: With the first dose of Herceptin, gotworse followed by Demerol which caused nausea and vomiting 5. Diarrhea due to Perjeta resolved after 1-2 days 5. 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 I decreased the dosage of Taxol. We'll monitor this very closely.  Return to clinic in 2weeks for cycle 10/12 Taxol.   No orders of the defined types were placed in this encounter.  The patient has a good understanding of the overall plan. she agrees with it. she will call with any problems that may develop before the next visit here.   Rulon Eisenmenger, MD 12/20/15

## 2015-12-20 NOTE — Patient Instructions (Signed)
[facility] Discharge Instructions for Patients Receiving Chemotherapy  Today you received the following chemotherapy agents: taxol   To help prevent nausea and vomiting after your treatment, we encourage you to take your nausea medication.  Take it as often as prescribed.     If you develop nausea and vomiting that is not controlled by your nausea medication, call the clinic. If it is after clinic hours your family physician or the after hours number for the clinic or go to the Emergency Department.   BELOW ARE SYMPTOMS THAT SHOULD BE REPORTED IMMEDIATELY:  *FEVER GREATER THAN 100.5 F  *CHILLS WITH OR WITHOUT FEVER  NAUSEA AND VOMITING THAT IS NOT CONTROLLED WITH YOUR NAUSEA MEDICATION  *UNUSUAL SHORTNESS OF BREATH  *UNUSUAL BRUISING OR BLEEDING  TENDERNESS IN MOUTH AND THROAT WITH OR WITHOUT PRESENCE OF ULCERS  *URINARY PROBLEMS  *BOWEL PROBLEMS  UNUSUAL RASH Items with * indicate a potential emergency and should be followed up as soon as possible.  One of the nurses will contact you 24 hours after your treatment. Please let the nurse know about any problems that you may have experienced. Feel free to call the clinic you have any questions or concerns. The clinic phone number is [phone].   I have been informed and understand all the instructions given to me. I know to contact the clinic, my physician, or go to the Emergency Department if any problems should occur. I do not have any questions at this time, but understand that I may call the clinic during office hours   should I have any questions or need assistance in obtaining follow up care.    __________________________________________  _____________  __________ Signature of Patient or Authorized Representative            Date                   Time    __________________________________________ Nurse's Signature

## 2015-12-20 NOTE — Assessment & Plan Note (Signed)
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%; HER-2 negative ratio 1.04; 10:00: IDC grade 2-3 dissimilar to 1:00 biopsy 10:00: ER 95%, PR 0%, K67: 25%, HER-2 positiveratio 6.42 right axillary lymph node positive for metastatic cancer (LN is Her 2 neg) --------------------------------------------------------------------------------- Left breast LIQ 08/20/2015: 4.2 cm, 4 mm oval mass 12:00, 1.2 cm mass left UOQ, enlarged left axillary lymph node, T2 N1 stage II B clinical stage  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 CT CAP and Bone scan 08/27/15: Bil Breast masses, Bil Axill LN, Bil lower lobe lung nodules indeterminate 7 mm  --------------------------------------------------------------------------------------------------------------------------------------- Treatment plan: 1. Neoadjuvant chemotherapy with either AC followed by THP (followed by CT chest to evaluate the lung nodules and repeat breast MRI) 2. Followed by bilateral mastectomies and lymph node surgery 3. Followed by adjuvant radiation 4. Followed by adjuvant antiestrogen therapy ------------------------------------------------------------------------------------------------------------------------------------ Current treatment: weekly Taxol and every three-week Herceptin and Perjeta; today's cycle 8/12Taxol and cycle 3 Herceptin and Perjeta Echocardiogram 08/24/2015: EF 60-65%  Chemotherapy toxicities: 1. Mild nausea 2. Mild fatigue and headache 3. Mouth sores: Given a prescription for Magic mouthwash 4. Infusion reaction: With  the first dose of Herceptin, gotworse followed by Demerol which caused nausea and vomiting 5. Diarrhea due to Perjeta resolved after 1-2 days 5. 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 I decreased the dosage of Taxol. We'll monitor this very closely.  Return to clinic in 2weeks for cycle 10/12 Taxol.

## 2015-12-27 ENCOUNTER — Other Ambulatory Visit (HOSPITAL_BASED_OUTPATIENT_CLINIC_OR_DEPARTMENT_OTHER): Payer: Self-pay

## 2015-12-27 ENCOUNTER — Ambulatory Visit (HOSPITAL_BASED_OUTPATIENT_CLINIC_OR_DEPARTMENT_OTHER): Payer: Self-pay

## 2015-12-27 VITALS — BP 135/70 | HR 68 | Temp 98.2°F | Resp 18

## 2015-12-27 DIAGNOSIS — C50812 Malignant neoplasm of overlapping sites of left female breast: Secondary | ICD-10-CM

## 2015-12-27 DIAGNOSIS — C50811 Malignant neoplasm of overlapping sites of right female breast: Secondary | ICD-10-CM

## 2015-12-27 DIAGNOSIS — C50211 Malignant neoplasm of upper-inner quadrant of right female breast: Secondary | ICD-10-CM

## 2015-12-27 DIAGNOSIS — C50312 Malignant neoplasm of lower-inner quadrant of left female breast: Secondary | ICD-10-CM

## 2015-12-27 DIAGNOSIS — C50411 Malignant neoplasm of upper-outer quadrant of right female breast: Secondary | ICD-10-CM

## 2015-12-27 DIAGNOSIS — Z5111 Encounter for antineoplastic chemotherapy: Secondary | ICD-10-CM

## 2015-12-27 LAB — CBC WITH DIFFERENTIAL/PLATELET
BASO%: 0.4 % (ref 0.0–2.0)
BASOS ABS: 0 10*3/uL (ref 0.0–0.1)
EOS%: 0.9 % (ref 0.0–7.0)
Eosinophils Absolute: 0.1 10*3/uL (ref 0.0–0.5)
HEMATOCRIT: 35.4 % (ref 34.8–46.6)
HGB: 12 g/dL (ref 11.6–15.9)
LYMPH#: 1.2 10*3/uL (ref 0.9–3.3)
LYMPH%: 21.6 % (ref 14.0–49.7)
MCH: 30.9 pg (ref 25.1–34.0)
MCHC: 33.9 g/dL (ref 31.5–36.0)
MCV: 91.2 fL (ref 79.5–101.0)
MONO#: 0.5 10*3/uL (ref 0.1–0.9)
MONO%: 9.6 % (ref 0.0–14.0)
NEUT#: 3.7 10*3/uL (ref 1.5–6.5)
NEUT%: 67.5 % (ref 38.4–76.8)
PLATELETS: 234 10*3/uL (ref 145–400)
RBC: 3.88 10*6/uL (ref 3.70–5.45)
RDW: 14.8 % — ABNORMAL HIGH (ref 11.2–14.5)
WBC: 5.5 10*3/uL (ref 3.9–10.3)

## 2015-12-27 LAB — COMPREHENSIVE METABOLIC PANEL
ALT: 46 U/L (ref 0–55)
AST: 35 U/L — AB (ref 5–34)
Albumin: 3.4 g/dL — ABNORMAL LOW (ref 3.5–5.0)
Alkaline Phosphatase: 67 U/L (ref 40–150)
Anion Gap: 8 mEq/L (ref 3–11)
BUN: 11.4 mg/dL (ref 7.0–26.0)
CALCIUM: 9.2 mg/dL (ref 8.4–10.4)
CHLORIDE: 108 meq/L (ref 98–109)
CO2: 21 meq/L — AB (ref 22–29)
CREATININE: 0.7 mg/dL (ref 0.6–1.1)
EGFR: >90 mL/min/{1.73_m2} (ref >90)
Glucose: 110 mg/dl (ref 70–140)
POTASSIUM: 3.7 meq/L (ref 3.5–5.1)
Sodium: 137 mEq/L (ref 136–145)
Total Bilirubin: 0.76 mg/dL (ref 0.20–1.20)
Total Protein: 6.7 g/dL (ref 6.4–8.3)

## 2015-12-27 MED ORDER — DIPHENHYDRAMINE HCL 50 MG/ML IJ SOLN
INTRAMUSCULAR | Status: AC
  Filled 2015-12-27: qty 1

## 2015-12-27 MED ORDER — DIPHENHYDRAMINE HCL 50 MG/ML IJ SOLN
25.0000 mg | Freq: Once | INTRAMUSCULAR | Status: AC
  Administered 2015-12-27: 25 mg via INTRAVENOUS

## 2015-12-27 MED ORDER — FAMOTIDINE IN NACL 20-0.9 MG/50ML-% IV SOLN
20.0000 mg | Freq: Once | INTRAVENOUS | Status: AC
  Administered 2015-12-27: 20 mg via INTRAVENOUS

## 2015-12-27 MED ORDER — SODIUM CHLORIDE 0.9 % IV SOLN
Freq: Once | INTRAVENOUS | Status: AC
  Administered 2015-12-27: 10:00:00 via INTRAVENOUS

## 2015-12-27 MED ORDER — FAMOTIDINE IN NACL 20-0.9 MG/50ML-% IV SOLN
INTRAVENOUS | Status: AC
  Filled 2015-12-27: qty 50

## 2015-12-27 MED ORDER — ONDANSETRON HCL 40 MG/20ML IJ SOLN
Freq: Once | INTRAMUSCULAR | Status: AC
  Administered 2015-12-27: 11:00:00 via INTRAVENOUS
  Filled 2015-12-27: qty 4

## 2015-12-27 MED ORDER — SODIUM CHLORIDE 0.9 % IV SOLN
50.0000 mg/m2 | Freq: Once | INTRAVENOUS | Status: AC
  Administered 2015-12-27: 108 mg via INTRAVENOUS
  Filled 2015-12-27: qty 18

## 2015-12-27 MED ORDER — HEPARIN SOD (PORK) LOCK FLUSH 100 UNIT/ML IV SOLN
500.0000 [IU] | Freq: Once | INTRAVENOUS | Status: AC | PRN
  Administered 2015-12-27: 500 [IU]
  Filled 2015-12-27: qty 5

## 2015-12-27 MED ORDER — PACLITAXEL CHEMO INJECTION 300 MG/50ML: 50.0000 mg/m2 | Freq: Once | INTRAVENOUS | Status: DC

## 2015-12-27 MED ORDER — SODIUM CHLORIDE 0.9% FLUSH
10.0000 mL | INTRAVENOUS | Status: DC | PRN
  Administered 2015-12-27: 10 mL
  Filled 2015-12-27: qty 10

## 2016-01-03 ENCOUNTER — Ambulatory Visit (HOSPITAL_BASED_OUTPATIENT_CLINIC_OR_DEPARTMENT_OTHER): Payer: Self-pay

## 2016-01-03 ENCOUNTER — Encounter: Payer: Self-pay | Admitting: Hematology and Oncology

## 2016-01-03 ENCOUNTER — Other Ambulatory Visit (HOSPITAL_BASED_OUTPATIENT_CLINIC_OR_DEPARTMENT_OTHER): Payer: Self-pay

## 2016-01-03 ENCOUNTER — Other Ambulatory Visit: Payer: Self-pay

## 2016-01-03 ENCOUNTER — Ambulatory Visit (HOSPITAL_BASED_OUTPATIENT_CLINIC_OR_DEPARTMENT_OTHER): Payer: Self-pay | Admitting: Hematology and Oncology

## 2016-01-03 ENCOUNTER — Encounter: Payer: Self-pay | Admitting: *Deleted

## 2016-01-03 DIAGNOSIS — Z5112 Encounter for antineoplastic immunotherapy: Secondary | ICD-10-CM

## 2016-01-03 DIAGNOSIS — C50211 Malignant neoplasm of upper-inner quadrant of right female breast: Secondary | ICD-10-CM

## 2016-01-03 DIAGNOSIS — C50411 Malignant neoplasm of upper-outer quadrant of right female breast: Secondary | ICD-10-CM

## 2016-01-03 DIAGNOSIS — C50312 Malignant neoplasm of lower-inner quadrant of left female breast: Secondary | ICD-10-CM

## 2016-01-03 DIAGNOSIS — C50812 Malignant neoplasm of overlapping sites of left female breast: Secondary | ICD-10-CM

## 2016-01-03 DIAGNOSIS — C50811 Malignant neoplasm of overlapping sites of right female breast: Secondary | ICD-10-CM

## 2016-01-03 DIAGNOSIS — G620 Drug-induced polyneuropathy: Secondary | ICD-10-CM

## 2016-01-03 LAB — COMPREHENSIVE METABOLIC PANEL
ALBUMIN: 3.3 g/dL — AB (ref 3.5–5.0)
ALK PHOS: 68 U/L (ref 40–150)
ALT: 51 U/L (ref 0–55)
AST: 32 U/L (ref 5–34)
Anion Gap: 9 mEq/L (ref 3–11)
BUN: 10 mg/dL (ref 7.0–26.0)
CHLORIDE: 108 meq/L (ref 98–109)
CO2: 21 mEq/L — ABNORMAL LOW (ref 22–29)
Calcium: 9.1 mg/dL (ref 8.4–10.4)
Creatinine: 0.7 mg/dL (ref 0.6–1.1)
GLUCOSE: 158 mg/dL — AB (ref 70–140)
POTASSIUM: 3.2 meq/L — AB (ref 3.5–5.1)
SODIUM: 138 meq/L (ref 136–145)
Total Bilirubin: 0.76 mg/dL (ref 0.20–1.20)
Total Protein: 6.8 g/dL (ref 6.4–8.3)

## 2016-01-03 LAB — CBC WITH DIFFERENTIAL/PLATELET
BASO%: 0.6 % (ref 0.0–2.0)
BASOS ABS: 0 10*3/uL (ref 0.0–0.1)
EOS ABS: 0 10*3/uL (ref 0.0–0.5)
EOS%: 0.4 % (ref 0.0–7.0)
HCT: 37.5 % (ref 34.8–46.6)
HEMOGLOBIN: 12.6 g/dL (ref 11.6–15.9)
LYMPH%: 20.1 % (ref 14.0–49.7)
MCH: 30.9 pg (ref 25.1–34.0)
MCHC: 33.7 g/dL (ref 31.5–36.0)
MCV: 91.6 fL (ref 79.5–101.0)
MONO#: 0.4 10*3/uL (ref 0.1–0.9)
MONO%: 6.7 % (ref 0.0–14.0)
NEUT#: 4.2 10*3/uL (ref 1.5–6.5)
NEUT%: 72.2 % (ref 38.4–76.8)
Platelets: 238 10*3/uL (ref 145–400)
RBC: 4.09 10*6/uL (ref 3.70–5.45)
RDW: 15.2 % — AB (ref 11.2–14.5)
WBC: 5.9 10*3/uL (ref 3.9–10.3)
lymph#: 1.2 10*3/uL (ref 0.9–3.3)

## 2016-01-03 MED ORDER — SODIUM CHLORIDE 0.9 % IV SOLN
420.0000 mg | Freq: Once | INTRAVENOUS | Status: AC
  Administered 2016-01-03: 420 mg via INTRAVENOUS
  Filled 2016-01-03: qty 14

## 2016-01-03 MED ORDER — SODIUM CHLORIDE 0.9 % IV SOLN
Freq: Once | INTRAVENOUS | Status: AC
  Administered 2016-01-03: 13:00:00 via INTRAVENOUS
  Filled 2016-01-03: qty 4

## 2016-01-03 MED ORDER — SODIUM CHLORIDE 0.9 % IV SOLN
50.0000 mg/m2 | Freq: Once | INTRAVENOUS | Status: DC
  Filled 2016-01-03: qty 18

## 2016-01-03 MED ORDER — DIPHENHYDRAMINE HCL 50 MG/ML IJ SOLN
25.0000 mg | Freq: Once | INTRAMUSCULAR | Status: AC
  Administered 2016-01-03: 25 mg via INTRAVENOUS

## 2016-01-03 MED ORDER — SODIUM CHLORIDE 0.9 % IV SOLN
Freq: Once | INTRAVENOUS | Status: AC
  Administered 2016-01-03: 13:00:00 via INTRAVENOUS

## 2016-01-03 MED ORDER — ACETAMINOPHEN 160 MG/5ML PO SOLN
650.0000 mg | Freq: Once | ORAL | Status: AC
  Administered 2016-01-03: 650 mg via ORAL
  Filled 2016-01-03: qty 20.3

## 2016-01-03 MED ORDER — FAMOTIDINE IN NACL 20-0.9 MG/50ML-% IV SOLN
20.0000 mg | Freq: Once | INTRAVENOUS | Status: AC
  Administered 2016-01-03: 20 mg via INTRAVENOUS

## 2016-01-03 MED ORDER — TRASTUZUMAB CHEMO 150 MG IV SOLR
6.0000 mg/kg | Freq: Once | INTRAVENOUS | Status: AC
  Administered 2016-01-03: 630 mg via INTRAVENOUS
  Filled 2016-01-03: qty 30

## 2016-01-03 MED ORDER — SODIUM CHLORIDE 0.9% FLUSH
10.0000 mL | INTRAVENOUS | Status: DC | PRN
  Administered 2016-01-03: 10 mL
  Filled 2016-01-03: qty 10

## 2016-01-03 MED ORDER — OMEPRAZOLE 20 MG PO CPDR: 20.0000 mg | DELAYED_RELEASE_CAPSULE | Freq: Every day | ORAL | 0 refills | Status: DC

## 2016-01-03 MED ORDER — ACETAMINOPHEN 325 MG PO TABS: 650.0000 mg | ORAL_TABLET | Freq: Once | ORAL | Status: DC

## 2016-01-03 MED ORDER — DIPHENHYDRAMINE HCL 50 MG/ML IJ SOLN: 50.0000 mg | Freq: Once | INTRAMUSCULAR | Status: DC

## 2016-01-03 MED ORDER — DIPHENHYDRAMINE HCL 50 MG/ML IJ SOLN
INTRAMUSCULAR | Status: AC
  Filled 2016-01-03: qty 1

## 2016-01-03 MED ORDER — HEPARIN SOD (PORK) LOCK FLUSH 100 UNIT/ML IV SOLN
500.0000 [IU] | Freq: Once | INTRAVENOUS | Status: AC | PRN
  Administered 2016-01-03: 500 [IU]
  Filled 2016-01-03: qty 5

## 2016-01-03 MED ORDER — FAMOTIDINE IN NACL 20-0.9 MG/50ML-% IV SOLN
INTRAVENOUS | Status: AC
  Filled 2016-01-03: qty 50

## 2016-01-03 NOTE — Progress Notes (Signed)
Patient last ECHO completed 08/24/2015.  Patient has next ECHO scheduled for 01/11/2016.  Per Dr. Lindi Adie approved proceeding with Herceptin & Perjeta without current ECHO.

## 2016-01-03 NOTE — Progress Notes (Signed)
Patient Care Team: No Pcp Per Patient as PCP - General (General Practice)  SUMMARY OF ONCOLOGIC HISTORY:   Bilateral breast cancer ([place])   08/16/2015 Mammogram    Left breast LIQ 4.2 cm, 4 mm oval mass 12:00, 1.2 cm mass left UOQ, enlarged left axillary lymph node      08/16/2015 Mammogram    Right breast UIQ: 3.9 cm irregular mass, 1.5 cm mass in the right LOQ, 9 mm mass right UOQ, 6 mm mass right LIQ, enlarged lymph node Rt axilla      08/20/2015 Initial Diagnosis    Right breast biopsy 1:00: IDC grade 2 ER 95%, PR 2%, Ki 67 15%; HER-2 negative ratio 1.04; 10:00: IDC grade 2-3 dissimilar to 1:00 biopsy 10:00: ER 95%, PR 0%, K67: 25%, HER-2 positive ratio 6.42 right axillary lymph node positive for metastatic cancer      08/20/2015 Procedure    Left biopsy 8:00: IDC with DCIS, grade 2; ER 95%, PR 95%, Ki-67 30%, HER-2 negative ratio 1.2; 12:00: IDC with DCIS, grade 1 ER 95%, PR 95%, Ki-67 10% HER-2 negative ratio 1.14 not similar to this biopsy, left axillary lymph node positive,       09/13/2015 -  Neo-Adjuvant Chemotherapy    Neoadjuvant dose dense Adriamycin and Cytoxan followed by Taxol Herceptin Perjeta       CHIEF COMPLIANT: cycle 10 of Taxol  INTERVAL HISTORY: Felicia [name] is a 55 year old with above-mentioned history of right breast cancer currently on neoadjuvant chemotherapy. Today is cycle 10 of Taxol. She's been getting Herceptin and Perjeta as well. She's been tolerating it much better. She denies any nausea vomiting. She is concerned about her weight gain issues. Denies any neuropathy.  REVIEW OF SYSTEMS:   Constitutional: Denies fevers, chills or abnormal weight loss Eyes: Denies blurriness of vision Ears, nose, mouth, throat, and face: Denies mucositis or sore throat Respiratory: Denies cough, dyspnea or wheezes Cardiovascular: Denies palpitation, chest discomfort Gastrointestinal:  Denies nausea, heartburn or change in bowel habits Skin: Denies abnormal skin  rashes Lymphatics: Denies new lymphadenopathy or easy bruising Neurological:Denies numbness, tingling or new weaknesses Behavioral/Psych: Mood is stable, no new changes  Extremities: No lower extremity edema Breast:  denies any pain or lumps or nodules in either breasts All other systems were reviewed with the patient and are negative.  I have reviewed the past medical history, past surgical history, social history and family history with the patient and they are unchanged from previous note.  ALLERGIES:  has No Known Allergies.  MEDICATIONS:  Current Outpatient Prescriptions  Medication Sig Dispense Refill  . gabapentin (NEURONTIN) 100 MG capsule Take 1 capsule (130m) by mouth three times per day for 1 week.  Increase to 2 capsules (2039m by mouth three times per day for 1 week. Then increase to 3 capsules (30074mby mouth three times per day. 130 capsule 0  . HYDROcodone-acetaminophen (NORCO) 10-325 MG tablet Take 1 tablet by mouth every 6 (six) hours as needed. 10 tablet 0  . lidocaine-prilocaine (EMLA) cream Apply 1 application topically as needed (Apply 2- hours prior to access of port).    . LORazepam (ATIVAN) 0.5 MG tablet Take 0.5 mg by mouth every 6 (six) hours as needed for anxiety.    . magic mouthwash w/lidocaine SOLN Take 5 mLs by mouth 3 (three) times daily as needed for mouth pain. 100 mL 0  . naproxen (NAPROSYN) 500 MG tablet Take 500 mg by mouth 2 (two) times daily with a meal.    .  omeprazole (PRILOSEC) 20 MG capsule Take 1 capsule (20 mg total) by mouth daily. 30 capsule 0  . potassium chloride SA (K-DUR,KLOR-CON) 20 MEQ tablet Take 1 tablet (20 mEq total) by mouth daily. 30 tablet 0  . prochlorperazine (COMPAZINE) 10 MG tablet Take 10 mg by mouth every 6 (six) hours as needed for nausea or vomiting.     No current facility-administered medications for this visit.    Facility-Administered Medications Ordered in Other Visits  Medication Dose Route Frequency Provider  Last Rate Last Dose  . diphenhydrAMINE (BENADRYL) injection 50 mg  50 mg Intravenous Once Nicholas Lose, MD      . PACLitaxel (TAXOL) 108 mg in sodium chloride 0.9 % 250 mL chemo infusion (</= 53m/m2)  50 mg/m2 (Treatment Plan Recorded) Intravenous Once VNicholas Lose MD   Stopped at 01/03/16 1456  . sodium chloride flush (NS) 0.9 % injection 10 mL  10 mL Intracatheter PRN VNicholas Lose MD   10 mL at 01/03/16 1706    PHYSICAL EXAMINATION: ECOG PERFORMANCE STATUS: 1 - Symptomatic but completely ambulatory  Vitals:   01/03/16 1130  BP: 131/79  Pulse: 84  Resp: 18  Temp: 98.2 F (36.8 C)   Filed Weights   01/03/16 1130  Weight: 222 lb 14.4 oz (101.1 kg)    GENERAL:alert, no distress and comfortable SKIN: skin color, texture, turgor are normal, no rashes or significant lesions EYES: normal, Conjunctiva are pink and non-injected, sclera clear OROPHARYNX:no exudate, no erythema and lips, buccal mucosa, and tongue normal  NECK: supple, thyroid normal size, non-tender, without nodularity LYMPH:  no palpable lymphadenopathy in the cervical, axillary or inguinal LUNGS: clear to auscultation and percussion with normal breathing effort HEART: regular rate & rhythm and no murmurs and no lower extremity edema ABDOMEN:abdomen soft, non-tender and normal bowel sounds MUSCULOSKELETAL:no cyanosis of digits and no clubbing  NEURO: alert & oriented x 3 with fluent speech, no focal motor/sensory deficits EXTREMITIES: No lower extremity edema  LABORATORY DATA:  I have reviewed the data as listed   Chemistry      Component Value Date/Time   NA 138 01/03/2016 1101   K 3.2 (L) 01/03/2016 1101   CO2 21 (L) 01/03/2016 1101   BUN 10.0 01/03/2016 1101   CREATININE 0.7 01/03/2016 1101      Component Value Date/Time   CALCIUM 9.1 01/03/2016 1101   ALKPHOS 68 01/03/2016 1101   AST 32 01/03/2016 1101   ALT 51 01/03/2016 1101   BILITOT 0.76 01/03/2016 1101       Lab Results  Component Value  Date   WBC 5.9 01/03/2016   HGB 12.6 01/03/2016   HCT 37.5 01/03/2016   MCV 91.6 01/03/2016   PLT 238 01/03/2016   NEUTROABS 4.2 01/03/2016     ASSESSMENT & PLAN:  Bilateral breast cancer ([facility]) 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%; HER-2 negative ratio 1.04; 10:00: IDC grade 2-3 dissimilar to 1:00 biopsy 10:00: ER 95%, PR 0%, K67: 25%, HER-2 positiveratio 6.42 right axillary lymph node positive for metastatic cancer (LN is Her 2 neg) --------------------------------------------------------------------------------- Left breast LIQ 08/20/2015: 4.2 cm, 4 mm oval mass 12:00, 1.2 cm mass left UOQ, enlarged left axillary lymph node, T2 N1 stage II B clinical stage  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 CT CAP and Bone scan 08/27/15: Bil Breast masses, Bil Axill LN, Bil lower lobe lung nodules indeterminate 7 mm  --------------------------------------------------------------------------------------------------------------------------------------- Treatment plan: 1. Neoadjuvant chemotherapy with either AC followed by THP (followed by CT chest to evaluate the lung nodules and repeat breast MRI) 2. Followed by bilateral mastectomies and lymph node surgery 3. Followed by adjuvant radiation 4. Followed by adjuvant antiestrogen therapy ------------------------------------------------------------------------------------------------------------------------------------ Current treatment: weekly Taxol and every three-week Herceptin and Perjeta; today's cycle 8/12Taxol and cycle 3Herceptin and  Perjeta Echocardiogram 08/24/2015: EF 60-65%  Chemotherapy toxicities: 1. Mild nausea 2. Mild fatigue and headache 3. Mouth sores: Given a prescription for Magic mouthwash 4. Infusion reaction: With the first dose of Herceptin, gotworse followed by Demerol which caused nausea and vomiting 5. Diarrhea due to Perjeta resolved after 1-2 days 5. 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 I decreased the dosage of Taxol. We'll monitor this very closely.  Return to clinic in 2weeks for cycle 12/12 Taxol.  Plan: Breast MRI will be scheduled in follow-up afterwards to see the surgeon and myself, will present at breast tumor board.   Orders Placed This Encounter  Procedures  . MR Breast Bilateral W Wo Contrast    Standing Status:   Future    Standing Expiration Date:   03/04/2017    Order Specific Question:   If indicated for the ordered procedure, I authorize the administration of contrast media per Radiology protocol    Answer:   Yes    Order Specific Question:   Reason for Exam (SYMPTOM  OR DIAGNOSIS REQUIRED)    Answer:   Breast cancer: Post neoadj chemo    Order Specific Question:   Preferred imaging location?    Answer:   [facility] (table limit-550lbs)    Order Specific Question:   Does the patient have a pacemaker or implanted devices?    Answer:   No    Order Specific Question:   What is the patient's sedation requirement?    Answer:   No Sedation   The patient has a good understanding of the overall plan. she agrees with it. she will call with any problems that may develop before the next visit here.   Rulon Eisenmenger, MD 01/03/16

## 2016-01-03 NOTE — Patient Instructions (Signed)
[facility] Discharge Instructions for Patients Receiving Chemotherapy  Today you received the following chemotherapy agents Herceptin, Perjeta, and Taxol  To help prevent nausea and vomiting after your treatment, we encourage you to take your nausea medication as directed.   If you develop nausea and vomiting that is not controlled by your nausea medication, call the clinic.   BELOW ARE SYMPTOMS THAT SHOULD BE REPORTED IMMEDIATELY:  *FEVER GREATER THAN 100.5 F  *CHILLS WITH OR WITHOUT FEVER  NAUSEA AND VOMITING THAT IS NOT CONTROLLED WITH YOUR NAUSEA MEDICATION  *UNUSUAL SHORTNESS OF BREATH  *UNUSUAL BRUISING OR BLEEDING  TENDERNESS IN MOUTH AND THROAT WITH OR WITHOUT PRESENCE OF ULCERS  *URINARY PROBLEMS  *BOWEL PROBLEMS  UNUSUAL RASH Items with * indicate a potential emergency and should be followed up as soon as possible.  Feel free to call the clinic you have any questions or concerns. The clinic phone number is [phone].  Please show the [facility] at check-in to the Emergency Department and triage nurse.  Trastuzumab injection for infusion Qu es este medicamento? El TRASTUZUMAB es un anticuerpo monoclonal. Genene Churn para tratar el cncer de mama y de [place]. Este medicamento puede ser utilizado para otros usos; si tiene alguna pregunta consulte con su proveedor de atencin mdica o con su farmacutico. Qu le debo informar a mi profesional de la salud antes de tomar este medicamento? Necesita saber si usted presenta alguno de los siguientes problemas o situaciones: -enfermedad cardiaca -insuficiencia cardiaca -infeccin (especialmente infecciones virales, como la varicela o el herpes) -enfermedad pulmonar o respiratoria, como asma -radioterapia reciente o continuada -una reaccin alrgica o inusual al trastuzumab, al alcohol benclico, a otros medicamentos, alimentos, colorantes o conservantes -si est embarazada o buscando quedar  embarazada -si est amamantando a un beb Cmo debo utilizar este medicamento? Este medicamento se administra mediante infusin por va intravenosa. Lo administra un profesional de la salud calificado en un hospital o en un entorno clnico. Hable con su pediatra para informarse acerca del uso de este medicamento en nios. Este medicamento no est aprobado para uso en nios. Sobredosis: Pngase en contacto inmediatamente con un centro toxicolgico o una sala de urgencia si usted cree que haya tomado demasiado medicamento. ATENCIN: [employer] es solo para usted. No comparta este medicamento con nadie. Qu sucede si me olvido de una dosis? Es importante no olvidar ninguna dosis. Informe a su mdico o a su profesional de la salud si no puede asistir a Photographer. Qu puede interactuar con este medicamento? -doxorubicina -warfarina Puede ser que esta lista no menciona todas las posibles interacciones. Informe a su profesional de KB Home	[place] de [employer] productos a base de hierbas, medicamentos de [place] o suplementos nutritivos que est tomando. Si usted fuma, consume bebidas alcohlicas o si utiliza drogas ilegales, indqueselo tambin a su profesional de KB Home	[place]. Algunas sustancias pueden interactuar con su medicamento. A qu debo estar atento al usar Coca-Cola? Visite a su mdico para chequear su evolucin peridicamente. Si presenta algn efecto secundario, infrmelo. Sin embargo, contine con el tratamiento aun si se siente enfermo, a menos que su mdico le indique que lo suspenda. Consulte a su mdico o a su profesional de la salud si tiene fiebre, escalofros, dolor de garganta, o cualquier otro sntoma de resfro o gripe. No se trate usted mismo. Trate de no acercarse a personas que estn enfermas. Es posible que tenga [place], escalofros y temblores durante su primera infusin. Estos efectos generalmente son  leves y pueden tratarse con otros medicamentos. Informe cualquier  efecto secundario durante la infusin a su profesional de [employer]. La fiebre y los escalofros por lo general no suceden con las infusiones posteriores. No debe quedar embarazada mientras est tomando este medicamento o por 7 meses despus de dejar de usarlo. Las mujeres deben informar a su mdico si estn buscando quedar embarazadas o si creen que estn embarazadas. Las mujeres con la posibilidad de Warehouse manager nios deben tener una prueba de embarazo negativa antes de Corporate investment banker a tomar este medicamento. Existe la posibilidad de que ocurran efectos secundarios graves a un beb sin nacer. Para ms informacin hable con su profesional de la salud o su farmacutico. No debe amamantar a un beb mientras est tomando este medicamento o por 7 meses despus de dejar de usarlo. Las [employer] Foods deben usar un mtodo anticonceptivo eficaz con este medicamento. Qu efectos secundarios puedo tener al [employer] este medicamento? Efectos secundarios que debe informar a su mdico o a Scientist, water quality de la salud tan pronto como sea posible: -problemas respiratorios -Journalist, newspaper o palpitaciones -tos -mareos o desmayos -fiebre o escalofros, dolor de garganta -erupcin cutnea, picazn o urticarias -hinchazn de los tobillos o las piernas -cansancio o debilidad inusual Efectos secundarios que, por lo general, no requieren Psychologist, prison and probation services (debe informarlos a su mdico u otro profesional de la salud si persisten o si son molestos): -prdida del apetito -dolor de cabeza -dolores musculares -nuseas Puede ser que esta lista no menciona todos los posibles efectos secundarios. Comunquese a su mdico por asesoramiento mdico [employer]. Usted puede informar los efectos secundarios a la FDA por telfono al 1-800-FDA-1088. Dnde debo guardar mi medicina? Este medicamento se administra en hospitales o clnicas y no necesitar guardarlo en su domicilio. ATENCIN: Este folleto es un resumen. Puede ser que no  cubra toda la posible informacin. Si usted tiene preguntas acerca de esta medicina, consulte con su mdico, su farmacutico o su profesional de Radiographer, therapeutic.    2016, Elsevier/Gold Standard. (2014-09-14 00:00:00)  Pertuzumab injection Qu es este medicamento? El PERTUZUMAB es un anticuerpo monoclonal que acta sobre una protena llamada HER2. HER2 se encuentra en algunos cnceres de mama. Este medicamento puede detener el crecimiento de las clulas cancerosas. Este Automatic Data se puede usar con otros tratamientos para Management consultant. Este medicamento puede ser utilizado para otros usos; si tiene alguna pregunta consulte con su proveedor de atencin mdica o con su farmacutico. Qu le debo informar a mi profesional de la salud antes de tomar este medicamento? Necesita saber si usted presenta alguno de los siguientes problemas o situaciones: enfermedad cardiaca insuficiencia cardiaca alta presin sangunea antecedentes de pulso cardiaca irregular radioterapia reciente o continuada una reaccin alrgica o inusual al pertuzumab, a otros medicamentos, alimentos, colorantes o conservantes si est embarazada o buscando quedar embarazada si est amamantando a un beb Cmo debo utilizar este medicamento? Este medicamento se administra mediante infusin por va intravenosa. Lo administra un profesional de Radiographer, therapeutic en un hospital o en un entorno clnico. Hable con su pediatra para informarse acerca del uso de este medicamento en nios. Puede requerir atencin especial. Sobredosis: Pngase en contacto inmediatamente con un centro toxicolgico o una sala de urgencia si usted cree que haya tomado demasiado medicamento. ATENCIN: [employer] es solo para usted. No comparta este medicamento con nadie. Qu sucede si me olvido de una dosis? Es importante no olvidar ninguna dosis. Informe a su mdico o a su profesional de la salud si  no puede asistir a una cita. Qu puede interactuar con este medicamento? No se  esperan interacciones. Puede ser que esta lista no menciona todas las posibles interacciones. Informe a su profesional de KB Home	[place] de [employer] Corporation productos a base de hierbas, medicamentos de [place] o suplementos nutritivos que est tomando. Si usted fuma, consume bebidas alcohlicas o si utiliza drogas ilegales, indqueselo tambin a su profesional de KB Home	[place]. Algunas sustancias pueden interactuar con su medicamento. A qu debo estar atento al usar Coca-Cola? Se supervisar su estado de salud atentamente mientras reciba este medicamento. Si presenta algn efecto secundario, infrmelo. Sin embargo, contine con el tratamiento aun si se siente enfermo, a menos que su mdico le indique que lo suspenda. No debe quedar embarazada mientras est tomando este medicamento o por 7 meses despus de dejar de usarlo. Las mujeres deben informar a su mdico si estn buscando quedar embarazadas o si creen que estn embarazadas. Las mujeres con la posibilidad de Best boy nios deben tener una prueba de embarazo negativa antes de Art gallery manager a tomar este medicamento. Existe la posibilidad de que ocurran efectos secundarios graves a un beb sin nacer. Para ms informacin hable con su profesional de la salud o su farmacutico. No debe amamantar a un beb mientras est tomando este medicamento o por 7 meses despus de dejar de usarlo. Las [employer] deben usar un mtodo anticonceptivo eficaz con este medicamento. Consulte a su mdico o a su profesional de la salud si tiene fiebre, escalofros, dolor de garganta, o cualquier otro sntoma de resfro o gripe. No se trate usted mismo. Trate de no acercarse a personas que estn enfermas. Es posible que tenga [place], escalofros y dolor de cabeza durante la infusin. Informe cualquier efecto secundario durante la infusin a su profesional de KB Home	[place]. Qu efectos secundarios puedo tener al [employer] este medicamento? Efectos secundarios que debe informar a su mdico o a Ship broker de la salud tan pronto como sea posible: Arboriculturist o palpitaciones mareos sensacin de Youth worker o aturdimiento fiebre o escalofros erupcin cutnea, picazn o urticarias dolor de garganta hinchazn de la cara, labio o lengua hinchazn de las piernas o tobillos cansancio o debilidad inusual Efectos secundarios que, por lo general, no requieren atencin mdica (debe informarlos a su mdico o a su profesional de la salud si persisten o si son molestos): diarrea cada del cabello nuseas, vmito somnolencia Puede ser que esta lista no menciona todos los posibles efectos secundarios. Comunquese a su mdico por asesoramiento mdico [employer]. Usted puede informar los efectos secundarios a la FDA por telfono al 1-800-FDA-1088. Dnde debo guardar mi medicina? Este medicamento se administra en hospitales o clnicas y no necesitar guardarlo en su domicilio. ATENCIN: Este folleto es un resumen. Puede ser que no cubra toda la posible informacin. Si usted tiene preguntas acerca de esta medicina, consulte con su mdico, su farmacutico o su profesional de Technical sales engineer.    2016, Elsevier/Gold Standard. (2014-09-14 00:00:00)   Paclitaxel injection Qu es este medicamento? El PACLITAXEL es un agente quimioteraputico. Este medicamento acta sobre las clulas que se dividen rpidamente, como las clulas cancerosas, y finalmente provoca la muerte de estas clulas. Se utiliza en el tratamiento del cncer de [place], mama y otros tipos de cncer. Este medicamento puede ser utilizado para otros usos; si tiene alguna pregunta consulte con su proveedor de atencin mdica o con su farmacutico. Qu le debo informar a mi profesional de la salud antes de  tomar este medicamento? Necesita saber si usted presenta alguno de los siguientes problemas o situaciones: -trastornos sanguneos -pulso cardiaco irregular -infeccin (especialmente infecciones virales,  como varicela o herpes) -enfermedad heptica -radioterapia reciente o continua -una reaccin alrgica o inusual al paclitaxel, alcohol, aceite de ricino polioxietilado, otros agentes quimioteraputicos, medicamentos, alimentos, colorantes o conservantes -si est embarazada o buscando quedar embarazada -si est amamantando a un beb Cmo debo utilizar este medicamento? Este medicamento se administra mediante infusin por va intravenosa. Lo administra un profesional de la salud calificado en un hospital o en un entorno clnico. Hable con su pediatra para informarse acerca del uso de este medicamento en nios. Puede requerir atencin especial. Sobredosis: Pngase en contacto inmediatamente con un centro toxicolgico o una sala de urgencia si usted cree que haya tomado demasiado medicamento. ATENCIN: [employer] es solo para usted. No comparta este medicamento con nadie. Qu sucede si me olvido de una dosis? Es importante no olvidar ninguna dosis. Informe a su mdico o a su profesional de la salud si no puede asistir a Photographer. Qu puede interactuar con este medicamento? No tome esta medicina con ninguno de los siguientes medicamentos: -disulfiram -metronidazol Esta medicina tambin puede interactuar con los siguientes medicamentos: -ciclosporina -diazepam -quetoconazol -medicamentos para incrementar los conteos sanguneos, tales como filgrastim, pegfilgrastim, sargramostim -otros agentes quimioteraputicos, tales como cisplatino, doxorrubicina, epirubicina, etopsido, teniposida, vincristina -quinidina -testosterona -vacunas -verapamilo Consulte a su mdico o a su profesional de la salud antes de tomar cualquiera de los siguientes medicamentos: -acetaminofeno -aspirina -ibuprofeno -quetoprofeno -naproxeno Puede ser que esta lista no menciona todas las posibles interacciones. Informe a su profesional de KB Home	[place] de [employer] productos a base de hierbas, medicamentos de [place] o suplementos nutritivos que est tomando. Si usted fuma, consume bebidas alcohlicas o si utiliza drogas ilegales, indqueselo tambin a su profesional de KB Home	[place]. Algunas sustancias pueden interactuar con su medicamento. A qu debo estar atento al usar Coca-Cola? Se supervisar su estado de salud atentamente mientras reciba este medicamento. Tendr que hacerse anlisis de sangre importantes mientras est tomando este medicamento. Este medicamento puede hacerle sentir un Nurse, mental health. Esto es normal ya que la quimioterapia afecta tanto a las clulas sanas como a las clulas cancerosas. Si presenta alguno de los [employer], infrmelos. Sin embargo, contine con el tratamiento aun si se siente enfermo, a menos que su mdico le indique que lo suspenda. Este medicamento puede causar Chief of Staff graves. Para reducir su riesgo necesitar tomar otro(s) medicamento(s) antes del tratamiento con este medicamento. En algunos casos, podr recibir [employer]ands para ayudarlo con los efectos secundarios. Siga las instrucciones para usarlos. Consulte a su mdico o a su profesional de la salud si tiene fiebre, escalofros, dolor de garganta o cualquier otro sntoma de resfro o gripe. No se trate usted mismo. Este medicamento puede reducir la capacidad del cuerpo para combatir infecciones. Trate de no acercarse a personas que estn enfermas. [employer] puede aumentar el riesgo de magulladuras o sangrado. Consulte a su mdico o a su profesional de la salud si observa sangrados inusuales. Proceda con cuidado al cepillar sus dientes, usar hilo dental o Risk manager palillos para los dientes, ya que puede contraer una infeccin o Therapist, art con mayor facilidad. Si se somete a algn tratamiento dental, informe a su dentista que est [employer]. Evite tomar productos que contienen aspirina, acetaminofeno, ibuprofeno, naproxeno o quetoprofeno a menos que as lo indique  su mdico. Estos productos pueden disimular la fiebre. No debe quedar  embarazada mientras recibe Coca-Cola. Las mujeres deben informar a su mdico si estn buscando quedar embarazadas o si creen que estn embarazadas. Existe la posibilidad de que ocurran efectos secundarios graves a un beb sin nacer. Para ms informacin hable con su profesional de la salud o su farmacutico. No debe amamantar a un beb mientras est tomando este medicamento. Para los hombres, se desaconseja tener nios mientras reciben Coca-Cola. Qu efectos secundarios puedo tener al [employer] este medicamento? Efectos secundarios que debe informar a su mdico o a Barrister's clerk de la salud tan pronto como sea posible: Chief of Staff, como erupcin cutnea, picazn o urticarias, hinchazn de la cara, los labios o la lengua conteos sanguneos bajos - este frmaco puede reducir la cantidad de glbulos blancos, glbulos rojos y plaquetas. Su riesgo de infeccin y [place]. signos de infeccin - fiebre o escalofros, tos, dolor de garganta, dolor o dificultad para orinar signos de reduccin de plaquetas o sangrado - magulladuras, puntos rojos en la piel, heces de color oscuro o con aspecto alquitranado, sangrado por la nariz signos de reduccin de glbulos rojos - cansancio o debilidad inusual, desmayos, sensacin de mareo problemas respiratorios dolor en el pecho alta o baja presin sangunea llagas en la boca nuseas y vmito dolor, hinchazn, enrojecimiento o irritacin en el lugar de la Gaffer, hormigueo, entumecimiento de las manos o los pies pulso cardiaco lento o irregular hinchazn de tobillos, pies, manosEfectos secundarios que, por lo general, no requieren atencin mdica (debe informarlos a su mdico o a su profesional de la salud si persisten o si son molestos): dolor de huesos cada total del cabello, incluyendo el cabello de la cabeza, de las [place], el vello pbico, las cejas y las  pestaas cambios en el color de las uas de las manos diarrea aflojamiento de las uas de las manos prdida del apetito dolores musculares o articulares enrojecimiento de la piel sudoracin Puede ser que esta lista no menciona todos los posibles efectos secundarios. Comunquese a su mdico por asesoramiento mdico [employer]. Usted puede informar los efectos secundarios a la FDA por telfono al 1-800-FDA-1088. Dnde debo guardar mi medicina? Este medicamento se administra en hospitales o clnicas y no necesitar guardarlo en su domicilio. ATENCIN: Este folleto es un resumen. Puede ser que no cubra toda la posible informacin. Si usted tiene preguntas acerca de esta medicina, consulte con su mdico, su farmacutico o su profesional de Technical sales engineer.    2016, Elsevier/Gold Standard. (2014-08-25 00:00:00)  Contenido de potasio de los alimentos (Potassium Content of Foods) El potasio es un mineral que se encuentra en muchos alimentos y bebidas. Ayuda a Contractor equilibrio de lquidos y [employer] organismo, e influye en la regularidad con la que el corazn late. El potasio tambin ayuda a Chief Technology Officer la presin arterial y a Electrical engineer y [place]. Algunos medicamentos y afecciones pueden cambiar el equilibrio de potasio en el cuerpo. Cuando esto sucede, puede ayudar a [employer] de potasio a travs de los alimentos que consume. El mdico o el nutricionista le recomendarn la cantidad de potasio que debe ingerir por Training and development officer. Las siguientes listas proporcionan la cantidad de potasio (entre parntesis) por porcin en cada alimento. CON ALTO CONTENIDO DE POTASIO  Los siguientes alimentos y bebidas tienen 200 mg o ms de potasio por porcin:  Damascos, 2 crudos o 5 secos (200 mg)  Alc[place]ofa, 1 mediana (345 mg)  Aguacate, crudo, 1/4 (245 mg)  Banana, 1 mediana (425 mg)  Frijoles, lima o frijoles en salsa de tomates, enlatados, 1/2 taza (280  mg)  Frijoles blancos, enlatados, 1/2 taza (595 mg)  Carne asada, 3 onzas/85 g (320 mg)  Carne molida, 3 onzas/85 g (270 mg)  Remolachas, crudas o cocidas, 1/2 taza (260 mg)  Bollitos de salvado, 2 onzas/57 g (300 mg)  Brcoli, 1/2 taza (230 mg)  Repollitos de Bruselas, 1/2 taza (250 mg)  Meln, 1/2 taza (215 mg)  Cereales, 100 % de salvado, 1/2 taza (200 a 400 mg)  [place] de queso, sola, comida rpida, 1 (225 a 400 mg)  Pollo, 3 onzas/85 g (220 mg)  Almejas, enlatadas, 3 onzas/85 g (535 mg)  Cangrejo, 3 onzas/85 g (225 mg)  Dtiles, 5 (270 mg)  Frijoles y guisantes secos, 1/2 taza (300 a 475 mg)  Higos, secos, 2 (260 mg)  Pescado: halibut, atn, bacalao, pargo, 3 onzas/85 g (480 mg)  Pescado: salmn, abadejo, pez espada, perca, 3 onzas/85 g (300 mg)  Pescado: atn, enlatado, 3 onzas/85 g (200 mg)  Papas fritas, comida rpida, 3 onzas/85 g (470 mg)  Granola con frutas y frutos secos, 1/2 taza (200 mg)  Jugo de pomelo, 1/2 taza (200 mg)  Hojas verdes de Teacher, English as a foreign language, 1/2 taza (655 mg)  Meln dulce, 1/2 taza (200 mg)  Col rizada, 1 taza (300 mg)  Kiwi, 1 mediano (240 mg)  Colirrbano, colinabo, chiriva, 1/2 taza (280 mg)  Lentejas, 1/2 taza (365 mg)  Mango, 1 (325 mg)  Leche con chocolate, 1 taza (420 mg)  Leche: descremada, parcialmente descremada, entera, suero de [place], 1 taza (350 a 380 mg)  Melaza, 1 cucharada (295 mg)  Championes, 1/2 taza (280 mg)  Nectarina, 1 (275 mg)  Frutos secos: almendras, manes, avellanas, nueces de [place], castaas de caj, mezclados, 1 onza/28 g (200 mg)  Frutos secos: pistachos, 1 onza/28 g (295 mg)  Naranja, 1 (240 mg)  Jugo de naranja, 1/2 taza (235 mg)  Papaya, mediana, 1/2 fruta (390 mg)  [place] de man, con trozos, 2 cucharadas (240 mg)  [place] de man, sin trozos, 2 cucharadas (210 mg)  Pera, 1 mediana (200 mg)  Granada, 1 entera (400 mg)  Jugo de granada, 1/2 taza (215  mg)  Cerdo, 3 onzas/85 g (350 mg)  Papas fritas (de bolsa), 1 onza/85 g (465 mg)  Papa, asada, con piel, 1 mediana (925 mg)  Papas, hervidas, 1/2 taza (255 mg)  Papas, pur, 1/2 taza (330 mg)  Jugo de ciruela, 1/2 taza (370 mg)  Ciruelas, 5 (305 mg)  Pudin de chocolate, 1/2 taza (230 mg)  [place], enlatada, 1/2 taza (250 mg)  Pasas de uva, sin semilla, 1/4 taza (270 mg)  Semillas de girasol o calabaza, 1 onza/28 g (240 mg)  Leche de soja, 1 taza (300 mg)  Espinaca, 1/2 taza (420 mg)  Espinaca, enlatada, 1/2 taza (370 mg)  Batata, asada, con piel, 1 mediana (450 mg)  Acelga suiza, 1/2 taza (480 mg)  Jugo de tomate o verduras, 1/2 taza (275 mg)  Salsa o pur de tomates, 1/2 taza (400 a 550 mg)  Tomate, crudo, 1 mediano (290 mg)  Tomates, enlatados, 1/2 taza (200 a 300 mg)  Pavo, 3 onzas/85 g (250 mg)  Germen de trigo, 1 onza/28 g (250 mg)  [place] de invierno, 1/2 taza (250 mg)  Yogur, comn o con frutas, 6 onzas/177 ml (260 a 435 mg)  [place], 1/2 taza (220 mg) [place]  siguientes alimentos y bebidas tienen entre 18 y 200 mg de potasio por porcin:  Manzana, 1 (150 mg)  Jugo de [place], 1/2 taza (150 mg)  Pur de Archivist, 1/2 taza (90 mg)  Nctar de damasco, 1/2 taza (140 mg)  Esprragos, pequeos, 1/2 taza o 6 esprragos (155 mg)  Bagel con canela y pasas de uva, 1 (130 mg)  Bagel con huevo o comn, 4 pulgada/10 cm, 1 (70 mg)  Frijoles verdes, 1/2 taza (90 mg)  Frijoles amarillos, 1/2 taza (190 mg)  Cerveza, regular, 12 onzas/355 ml (100 mg)  Remolachas, enlatadas, 1/2 taza (125 mg)  Moras, 1/2 taza (115 mg)  Arndanos, 1/2 taza (60 mg)  Pan integral, 1 rebanada (70 mg)  Brcoli, crudo, 1/2 taza (145 mg)  Repollo, 1/2 taza (150 mg)  Zanahorias, crudas o cocidas, 1/2 taza (180 mg)  Coliflor, cruda, 1/2 taza (150 mg)  Apio, crudo, 1/2 taza (155 mg)  Cereales, copos de salvado, 1/2 taza (120 a  150 mg)  Requesn, 1/2 taza (110 mg)  Cerezas, 10 (150 mg)  Chocolate, barra de 1 1/2 onza/43 g (165 mg)  Caf, de cafetera, 6 onzas/177 ml (90 mg)  Maz, 1/2 taza o 1 espiga (195 mg)  Pepinos, 1/2 taza (80 mg)  Huevo, grande, 1 (60 mg)  [place], 1/2 taza (60 mg)  Endivia, cruda, 1/2 taza (80 mg)  Muffin ingls, 1 (65 mg)  Pescado: reloj anaranjado, 3 onzas/85 g (150 mg)  Salchicha de [place], de vaca o cerdo, 1 (75 mg)  Cctel de frutas, 1/2 taza (115 mg)  Jugo de uvas, 1/2 taza (170 mg)  Pomelo, 1/2 (175 mg)  Uvas, 1/2 taza (155 mg)  Hojas verdes: col rizada, nabo, col, 1/2 taza (110 a 150 mg)  Helado o yogur helado, de chocolate, 1/2 taza (175 mg)  Helado o yogur helado, de vainilla, 1/2 taza (120 a 150 mg)  Limones, limas, 1 (80 mg)  Lechuga, todos los tipos, 1 taza (100 mg)  Delphi, 1/2 taza (150 mg)  Championes, crudos, 1/2 taza (110 mg)  Frutos secos: nueces, pacanas o macadamia, 1 onza/28 g ('125mg'$ )  Avena, 1/2 taza (80 mg)  [place], 1/2 taza (110 mg)  [place], cruda, 1/2 taza (120 mg)  Durazno, 1 (185 mg)  Duraznos, enlatados, 1/2 taza (120 mg)  Peras, enlatadas, 1/2 taza (120 mg)  Guisantes, congelados, 1/2 taza (90 mg)  Pimientos, verdes, 1/2 taza (130 mg)  Pimientos, rojos, 1/2 taza (160 mg)  Jugo de pia, 1/2 taza (165 mg)  Pia, fresca o enlatada, 1/2 taza (100 mg)  Ciruelas, 1 (105 mg)  Pudin de vainilla, 1/2 taza (150 mg)  Frambuesas, 1/2 taza (90 mg)  Ruibardo, 1/2 taza (115 mg)  Arroz salvaje, 1/2 taza (80 mg)  Camarones, 3 onzas/85 g (155 mg)  Espinaca, cruda, 1 taza (170 mg)  Fresas, 1/2 taza (125 mg)  Calabaza de verano, 1/2 taza (175 a 200 mg)  Acelga suiza, cruda, 1 taza (135 mg)  Mandarina, 1 (140 mg)  T, 6 onzas/177 ml (65 mg)  Nabos, 1/2 taza (140 mg)  Sanda, 1/2 taza (85 mg)  Vino tinto, de mesa, 5 onzas/148 ml (180 mg)  Vino blanco, de mesa, 5 onzas/148 ml (100  mg) CON BAJO CONTENIDO DE POTASIO Los siguientes alimentos y bebidas tienen menos de 50 mg de potasio por porcin:  Pan blanco, 1 rebanada (30 g)  Bebidas gaseosas, 12 onzas/355 ml (menos de 5 mg)  Queso, 1 onza/28 g (20 a  30 mg)  Arndanos rojos, 1/2 taza (45 mg)  Cctel con jugo de arndanos rojos, 1/2 taza (20 mg)  Grasas y aceites, 1 cucharada (menos de 5 mg)  Hummus, 1 cucharada (32 mg)  Nctar: papaya, mango o pera, 1/2 taza (35 mg)  Arroz blanco o integral, 1/2 taza (50 mg)  Espaguetis o macarrones, cocidos, 1/2 taza (30 mg)  Tortilla de harina o maz, 1 (50 mg)  Waffle, 4 pulgadas/10 cm, 1 (50 mg)  Castaas de agua, 1/2 taza (40 mg)   Esta informacin no tiene Marine scientist el consejo del mdico. Asegrese de hacerle al mdico cualquier pregunta que tenga.   Document Released: 01/07/2012 Document Revised: 04/19/2013 Elsevier Interactive Patient Education [employer].

## 2016-01-03 NOTE — Assessment & Plan Note (Signed)
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%; HER-2 negative ratio 1.04; 10:00: IDC grade 2-3 dissimilar to 1:00 biopsy 10:00: ER 95%, PR 0%, K67: 25%, HER-2 positiveratio 6.42 right axillary lymph node positive for metastatic cancer (LN is Her 2 neg) --------------------------------------------------------------------------------- Left breast LIQ 08/20/2015: 4.2 cm, 4 mm oval mass 12:00, 1.2 cm mass left UOQ, enlarged left axillary lymph node, T2 N1 stage II B clinical stage  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 CT CAP and Bone scan 08/27/15: Bil Breast masses, Bil Axill LN, Bil lower lobe lung nodules indeterminate 7 mm  --------------------------------------------------------------------------------------------------------------------------------------- Treatment plan: 1. Neoadjuvant chemotherapy with either AC followed by THP (followed by CT chest to evaluate the lung nodules and repeat breast MRI) 2. Followed by bilateral mastectomies and lymph node surgery 3. Followed by adjuvant radiation 4. Followed by adjuvant antiestrogen therapy ------------------------------------------------------------------------------------------------------------------------------------ Current treatment: weekly Taxol and every three-week Herceptin and Perjeta; today's cycle 8/12Taxol and cycle 3Herceptin and Perjeta Echocardiogram 08/24/2015: EF 60-65%  Chemotherapy toxicities: 1. Mild nausea 2. Mild fatigue and headache 3. Mouth sores: Given a prescription for Magic mouthwash 4. Infusion reaction: With  the first dose of Herceptin, gotworse followed by Demerol which caused nausea and vomiting 5. Diarrhea due to Perjeta resolved after 1-2 days 5. 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 I decreased the dosage of Taxol. We'll monitor this very closely.  Return to clinic in 2weeks for cycle 12/12 Taxol.  Plan: Breast MRI will be scheduled in follow-up afterwards to see the surgeon and myself, will present at breast tumor board.

## 2016-01-09 NOTE — Progress Notes (Unsigned)
Herceptin and Perjeta available thru patient drug replacement program.  Checking to determine if pharmacy received replacement products for mulitple infusion appointments.

## 2016-01-10 ENCOUNTER — Other Ambulatory Visit (HOSPITAL_BASED_OUTPATIENT_CLINIC_OR_DEPARTMENT_OTHER): Payer: Self-pay

## 2016-01-10 ENCOUNTER — Ambulatory Visit (HOSPITAL_BASED_OUTPATIENT_CLINIC_OR_DEPARTMENT_OTHER): Payer: Self-pay

## 2016-01-10 VITALS — BP 133/80 | HR 71 | Temp 97.8°F | Resp 18

## 2016-01-10 DIAGNOSIS — C50811 Malignant neoplasm of overlapping sites of right female breast: Secondary | ICD-10-CM

## 2016-01-10 DIAGNOSIS — C50211 Malignant neoplasm of upper-inner quadrant of right female breast: Secondary | ICD-10-CM

## 2016-01-10 DIAGNOSIS — C50812 Malignant neoplasm of overlapping sites of left female breast: Principal | ICD-10-CM

## 2016-01-10 DIAGNOSIS — Z5111 Encounter for antineoplastic chemotherapy: Secondary | ICD-10-CM

## 2016-01-10 DIAGNOSIS — C50411 Malignant neoplasm of upper-outer quadrant of right female breast: Secondary | ICD-10-CM

## 2016-01-10 DIAGNOSIS — C50312 Malignant neoplasm of lower-inner quadrant of left female breast: Secondary | ICD-10-CM

## 2016-01-10 LAB — CBC WITH DIFFERENTIAL/PLATELET
BASO%: 0.2 % (ref 0.0–2.0)
Basophils Absolute: 0 10*3/uL (ref 0.0–0.1)
EOS ABS: 0 10*3/uL (ref 0.0–0.5)
EOS%: 0.6 % (ref 0.0–7.0)
HEMATOCRIT: 36.2 % (ref 34.8–46.6)
HGB: 12.2 g/dL (ref 11.6–15.9)
LYMPH#: 1.5 10*3/uL (ref 0.9–3.3)
LYMPH%: 31.1 % (ref 14.0–49.7)
MCH: 30.6 pg (ref 25.1–34.0)
MCHC: 33.7 g/dL (ref 31.5–36.0)
MCV: 90.7 fL (ref 79.5–101.0)
MONO#: 0.3 10*3/uL (ref 0.1–0.9)
MONO%: 7.1 % (ref 0.0–14.0)
NEUT%: 61 % (ref 38.4–76.8)
NEUTROS ABS: 2.9 10*3/uL (ref 1.5–6.5)
PLATELETS: 212 10*3/uL (ref 145–400)
RBC: 3.99 10*6/uL (ref 3.70–5.45)
RDW: 13.5 % (ref 11.2–14.5)
WBC: 4.8 10*3/uL (ref 3.9–10.3)

## 2016-01-10 LAB — COMPREHENSIVE METABOLIC PANEL
ALT: 60 U/L — ABNORMAL HIGH (ref 0–55)
ANION GAP: 9 meq/L (ref 3–11)
AST: 49 U/L — ABNORMAL HIGH (ref 5–34)
Albumin: 3.5 g/dL (ref 3.5–5.0)
Alkaline Phosphatase: 76 U/L (ref 40–150)
BILIRUBIN TOTAL: 0.72 mg/dL (ref 0.20–1.20)
BUN: 9.6 mg/dL (ref 7.0–26.0)
CHLORIDE: 110 meq/L — AB (ref 98–109)
CO2: 20 meq/L — AB (ref 22–29)
Calcium: 9.4 mg/dL (ref 8.4–10.4)
Creatinine: 0.7 mg/dL (ref 0.6–1.1)
Glucose: 123 mg/dl (ref 70–140)
Potassium: 3.5 mEq/L (ref 3.5–5.1)
Sodium: 139 mEq/L (ref 136–145)
TOTAL PROTEIN: 6.9 g/dL (ref 6.4–8.3)

## 2016-01-10 MED ORDER — FAMOTIDINE IN NACL 20-0.9 MG/50ML-% IV SOLN
20.0000 mg | Freq: Once | INTRAVENOUS | Status: AC
  Administered 2016-01-10: 20 mg via INTRAVENOUS

## 2016-01-10 MED ORDER — DIPHENHYDRAMINE HCL 50 MG/ML IJ SOLN
INTRAMUSCULAR | Status: AC
  Filled 2016-01-10: qty 1

## 2016-01-10 MED ORDER — SODIUM CHLORIDE 0.9 % IV SOLN
Freq: Once | INTRAVENOUS | Status: AC
  Administered 2016-01-10: 12:00:00 via INTRAVENOUS

## 2016-01-10 MED ORDER — HEPARIN SOD (PORK) LOCK FLUSH 100 UNIT/ML IV SOLN
500.0000 [IU] | Freq: Once | INTRAVENOUS | Status: AC | PRN
  Administered 2016-01-10: 500 [IU]
  Filled 2016-01-10: qty 5

## 2016-01-10 MED ORDER — SODIUM CHLORIDE 0.9% FLUSH
10.0000 mL | INTRAVENOUS | Status: DC | PRN
  Administered 2016-01-10: 10 mL
  Filled 2016-01-10: qty 10

## 2016-01-10 MED ORDER — SODIUM CHLORIDE 0.9 % IV SOLN
50.0000 mg/m2 | Freq: Once | INTRAVENOUS | Status: AC
  Administered 2016-01-10: 108 mg via INTRAVENOUS
  Filled 2016-01-10: qty 18

## 2016-01-10 MED ORDER — DIPHENHYDRAMINE HCL 50 MG/ML IJ SOLN
25.0000 mg | Freq: Once | INTRAMUSCULAR | Status: AC
  Administered 2016-01-10: 25 mg via INTRAVENOUS

## 2016-01-10 MED ORDER — FAMOTIDINE IN NACL 20-0.9 MG/50ML-% IV SOLN
INTRAVENOUS | Status: AC
  Filled 2016-01-10: qty 50

## 2016-01-10 MED ORDER — SODIUM CHLORIDE 0.9 % IV SOLN
Freq: Once | INTRAVENOUS | Status: AC
  Administered 2016-01-10: 12:00:00 via INTRAVENOUS
  Filled 2016-01-10: qty 4

## 2016-01-11 ENCOUNTER — Ambulatory Visit (HOSPITAL_BASED_OUTPATIENT_CLINIC_OR_DEPARTMENT_OTHER)
Admission: RE | Admit: 2016-01-11 | Discharge: 2016-01-11 | Disposition: A | Discharge status: Home / Self Care | Payer: Self-pay | Source: Ambulatory Visit

## 2016-01-11 ENCOUNTER — Ambulatory Visit (HOSPITAL_COMMUNITY)
Admission: RE | Admit: 2016-01-11 | Discharge: 2016-01-11 | Disposition: A | Discharge status: Home / Self Care | Payer: Self-pay | Source: Ambulatory Visit | Attending: Cardiology | Admitting: Cardiology

## 2016-01-11 VITALS — BP 118/76 | HR 85 | Wt 221.0 lb

## 2016-01-11 DIAGNOSIS — Z833 Family history of diabetes mellitus: Secondary | ICD-10-CM | POA: Insufficient documentation

## 2016-01-11 DIAGNOSIS — C50812 Malignant neoplasm of overlapping sites of left female breast: Secondary | ICD-10-CM

## 2016-01-11 DIAGNOSIS — C50811 Malignant neoplasm of overlapping sites of right female breast: Secondary | ICD-10-CM | POA: Insufficient documentation

## 2016-01-11 DIAGNOSIS — E119 Type 2 diabetes mellitus without complications: Secondary | ICD-10-CM | POA: Insufficient documentation

## 2016-01-11 DIAGNOSIS — Z9221 Personal history of antineoplastic chemotherapy: Secondary | ICD-10-CM | POA: Insufficient documentation

## 2016-01-11 DIAGNOSIS — Z79899 Other long term (current) drug therapy: Secondary | ICD-10-CM | POA: Insufficient documentation

## 2016-01-11 NOTE — Progress Notes (Signed)
[facility] CONSULT NOTE  Referring Physician: Dr Lindi Adie  Primary Care: None Primary Cardiologist:  HPI: [name] is a 55 year old with history of bilateral breast cancer.   Bilateral breast cancer ([place])   08/16/2015 Mammogram    Left breast LIQ 4.2 cm, 4 mm oval mass 12:00, 1.2 cm mass left UOQ, enlarged left axillary lymph node     08/16/2015 Mammogram    Right breast UIQ: 3.9 cm irregular mass, 1.5 cm mass in the right LOQ, 9 mm mass right UOQ, 6 mm mass right LIQ, enlarged lymph node Rt axilla     08/20/2015 Initial Diagnosis    Right breast biopsy 1:00: IDC grade 2 ER 95%, PR 2%, Ki 67 15%; HER-2 negative ratio 1.04; 10:00: IDC grade 2-3 dissimilar to 1:00 biopsy 10:00: ER 95%, PR 0%, K67: 25%, HER-2 positive ratio 6.42 right axillary lymph node positive for metastatic cancer     08/20/2015 Procedure    Left biopsy 8:00: IDC with DCIS, grade 2; ER 95%, PR 95%, Ki-67 30%, HER-2 negative ratio 1.2; 12:00: IDC with DCIS, grade 1 ER 95%, PR 95%, Ki-67 10% HER-2 negative ratio 1.14 not similar to this biopsy, left axillary lymph node positive,      09/13/2015 -  Neo-Adjuvant Chemotherapy    Neoadjuvant dose dense Adriamycin and Cytoxan followed by Taxol Herceptin Perjeta    Overall feeling ok. Complaining of pain in feet and fingers. Denies SOB/CP.   ECHO 07/2015 EF 60-65% ECHO 01/11/2016 EF 60-65%. Lat S'11 GLS -19.3%    FH: Great Grandmother - heart disease  Review of Systems: [y] = yes, '[ ]'$  = no   General: Weight gain '[ ]'$ ; Weight loss '[ ]'$ ; Anorexia '[ ]'$ ; Fatigue [Y ]; Fever '[ ]'$ ; Chills '[ ]'$ ; Weakness [Y ]  Cardiac: Chest pain/pressure '[ ]'$ ; Resting SOB '[ ]'$ ; Exertional SOB '[ ]'$ ; Orthopnea '[ ]'$ ; Pedal Edema '[ ]'$ ; Palpitations '[ ]'$ ; Syncope '[ ]'$ ; Presyncope '[ ]'$ ; Paroxysmal nocturnal dyspnea'[ ]'$   Pulmonary: Cough '[ ]'$ ; Wheezing'[ ]'$ ; Hemoptysis'[ ]'$ ; Sputum '[ ]'$ ; Snoring '[ ]'$   GI: Vomiting'[ ]'$ ; Dysphagia'[ ]'$ ; Melena'[ ]'$ ; Hematochezia '[ ]'$ ; Heartburn'[ ]'$ ; Abdominal pain '[ ]'$ ;  Constipation '[ ]'$ ; Diarrhea '[ ]'$ ; BRBPR '[ ]'$   GU: Hematuria'[ ]'$ ; Dysuria '[ ]'$ ; Nocturia'[ ]'$   Vascular: Pain in legs with walking '[ ]'$ ; Pain in feet with lying flat '[ ]'$ ; Non-healing sores '[ ]'$ ; Stroke '[ ]'$ ; TIA '[ ]'$ ; Slurred speech '[ ]'$ ;  Neuro: Headaches'[ ]'$ ; Vertigo'[ ]'$ ; Seizures'[ ]'$ ; Paresthesias'[ ]'$ ;Blurred vision '[ ]'$ ; Diplopia '[ ]'$ ; Vision changes '[ ]'$   Ortho/Skin: Arthritis '[ ]'$ ; Joint pain Y'[ ]'$ ; Muscle pain '[ ]'$ ; Joint swelling '[ ]'$ ; Back Pain '[ ]'$ ; Rash '[ ]'$   Psych: Depression'[ ]'$ ; Anxiety'[ ]'$   Heme: Bleeding problems '[ ]'$ ; Clotting disorders '[ ]'$ ; Anemia '[ ]'$   Endocrine: Diabetes '[ ]'$ ; Thyroid dysfunction'[ ]'$    Past Medical History:  Diagnosis Date  . Cancer ([place])    breast  . Diabetes mellitus without complication ([facility])    no medications    Current Outpatient Prescriptions  Medication Sig Dispense Refill  . gabapentin (NEURONTIN) 100 MG capsule Take 1 capsule ('100mg'$ ) by mouth three times per day for 1 week.  Increase to 2 capsules ('200mg'$ ) by mouth three times per day for 1 week. Then increase to 3 capsules ('300mg'$ ) by mouth three times per day. (Patient taking differently: Take 100 mg by mouth at bedtime. Take 1 capsule ('100mg'$ ) by mouth three  times per day for 1 week.  Increase to 2 capsules ('200mg'$ ) by mouth three times per day for 1 week. Then increase to 3 capsules ('300mg'$ ) by mouth three times per day.) 130 capsule 0  . HYDROcodone-acetaminophen (NORCO) 10-325 MG tablet Take 1 tablet by mouth every 6 (six) hours as needed. 10 tablet 0  . lidocaine-prilocaine (EMLA) cream Apply 1 application topically as needed (Apply 2- hours prior to access of port).    . LORazepam (ATIVAN) 0.5 MG tablet Take 0.5 mg by mouth every 6 (six) hours as needed for anxiety.    . magic mouthwash w/lidocaine SOLN Take 5 mLs by mouth 3 (three) times daily as needed for mouth pain. 100 mL 0  . naproxen (NAPROSYN) 500 MG tablet Take 500 mg by mouth as needed.     Marland Kitchen omeprazole (PRILOSEC) 20 MG capsule Take 1 capsule (20 mg total) by  mouth daily. 30 capsule 0  . potassium chloride SA (K-DUR,KLOR-CON) 20 MEQ tablet Take 1 tablet (20 mEq total) by mouth daily. 30 tablet 0  . prochlorperazine (COMPAZINE) 10 MG tablet Take 10 mg by mouth every 6 (six) hours as needed for nausea or vomiting.     No current facility-administered medications for this encounter.     No Known Allergies    Social History   Social History  . Marital status: Married    Spouse name: Elon Jester Real  . Number of children: 3  . Years of education: N/A   Occupational History  . Not on file.   Social History Main Topics  . Smoking status: Never Smoker  . Smokeless tobacco: Never Used  . Alcohol use No  . Drug use: No  . Sexual activity: Yes    Birth control/ protection: Surgical   Other Topics Concern  . Not on file   Social History Narrative   3 children    [name] age 58, lives in [place]    [name] age 110 lives in [place]   [name] age 70 lives in [place]      Family History  Problem Relation Age of Onset  . Diabetes Mother     Vitals:   01/11/16 1107  BP: 118/76  Pulse: 85  SpO2: 99%  Weight: 221 lb (100.2 kg)    PHYSICAL EXAM: General:  Well appearing. No respiratory difficulty HEENT: normal Neck: supple. no JVD. Carotids 2+ bilat; no bruits. No lymphadenopathy or thryomegaly appreciated. Cor: PMI nondisplaced. Regular rate & rhythm. No rubs, gallops or murmurs. Lungs: clear Abdomen: soft, nontender, nondistended. No hepatosplenomegaly. No bruits or masses. Good bowel sounds. Extremities: no cyanosis, clubbing, rash, edema Neuro: alert & oriented x 3, cranial nerves grossly intact. moves all 4 extremities w/o difficulty. Affect pleasant.   ASSESSMENT & PLAN:  1. R and L Breast Cancer  Receiving THP . Today ECHO was reviewed and discussed by Dr Aundra Dubin.   Darrick Grinder, NP-C 01/11/16  Patient seen with NP, agree with the above note.    Patient has bilateral breast cancer. She  was treated with Adriamycin/Cytoxan in 5/17, this treatment has been completed.  She was then started on Taxol/Herceptin/Perjeta.  Herceptin to continue a year.   We discussed the potential cardiotoxic effects of Adriamycin and Herceptin.  I reviewed her 4/17 echo and today's echo.  LV systolic function and strain is preserved.  We will continue to follow echoes every 3 months for screening while she is on Herceptin.   [name] 01/12/2016

## 2016-01-11 NOTE — Patient Instructions (Signed)
Follow up  3 months with ECHO    We will call you for an appointment.

## 2016-01-11 NOTE — Progress Notes (Signed)
  Echocardiogram 2D Echocardiogram limited  has been performed.  Felicia [name] 01/11/2016, 10:58 AM

## 2016-01-17 ENCOUNTER — Ambulatory Visit (HOSPITAL_BASED_OUTPATIENT_CLINIC_OR_DEPARTMENT_OTHER): Payer: Self-pay

## 2016-01-17 ENCOUNTER — Other Ambulatory Visit (HOSPITAL_BASED_OUTPATIENT_CLINIC_OR_DEPARTMENT_OTHER): Payer: Self-pay

## 2016-01-17 VITALS — BP 113/75 | HR 72 | Temp 98.0°F | Resp 18

## 2016-01-17 DIAGNOSIS — C50812 Malignant neoplasm of overlapping sites of left female breast: Secondary | ICD-10-CM

## 2016-01-17 DIAGNOSIS — C50312 Malignant neoplasm of lower-inner quadrant of left female breast: Secondary | ICD-10-CM

## 2016-01-17 DIAGNOSIS — C50411 Malignant neoplasm of upper-outer quadrant of right female breast: Secondary | ICD-10-CM

## 2016-01-17 DIAGNOSIS — C50811 Malignant neoplasm of overlapping sites of right female breast: Secondary | ICD-10-CM

## 2016-01-17 DIAGNOSIS — C50211 Malignant neoplasm of upper-inner quadrant of right female breast: Secondary | ICD-10-CM

## 2016-01-17 DIAGNOSIS — Z5111 Encounter for antineoplastic chemotherapy: Secondary | ICD-10-CM

## 2016-01-17 LAB — CBC WITH DIFFERENTIAL/PLATELET
BASO%: 0.7 % (ref 0.0–2.0)
BASOS ABS: 0 10*3/uL (ref 0.0–0.1)
EOS%: 0.5 % (ref 0.0–7.0)
Eosinophils Absolute: 0 10*3/uL (ref 0.0–0.5)
HEMATOCRIT: 37 % (ref 34.8–46.6)
HGB: 12.3 g/dL (ref 11.6–15.9)
LYMPH%: 34.7 % (ref 14.0–49.7)
MCH: 30.2 pg (ref 25.1–34.0)
MCHC: 33.1 g/dL (ref 31.5–36.0)
MCV: 91.3 fL (ref 79.5–101.0)
MONO#: 0.5 10*3/uL (ref 0.1–0.9)
MONO%: 9.7 % (ref 0.0–14.0)
NEUT#: 2.8 10*3/uL (ref 1.5–6.5)
NEUT%: 54.4 % (ref 38.4–76.8)
Platelets: 251 10*3/uL (ref 145–400)
RBC: 4.06 10*6/uL (ref 3.70–5.45)
RDW: 14.1 % (ref 11.2–14.5)
WBC: 5.1 10*3/uL (ref 3.9–10.3)
lymph#: 1.8 10*3/uL (ref 0.9–3.3)

## 2016-01-17 LAB — COMPREHENSIVE METABOLIC PANEL
ALT: 62 U/L — AB (ref 0–55)
AST: 52 U/L — AB (ref 5–34)
Albumin: 3.6 g/dL (ref 3.5–5.0)
Alkaline Phosphatase: 75 U/L (ref 40–150)
Anion Gap: 7 mEq/L (ref 3–11)
BILIRUBIN TOTAL: 0.71 mg/dL (ref 0.20–1.20)
BUN: 10.6 mg/dL (ref 7.0–26.0)
CO2: 22 meq/L (ref 22–29)
CREATININE: 0.7 mg/dL (ref 0.6–1.1)
Calcium: 9.5 mg/dL (ref 8.4–10.4)
Chloride: 108 mEq/L (ref 98–109)
EGFR: >90 mL/min/{1.73_m2} (ref >90)
GLUCOSE: 101 mg/dL (ref 70–140)
Potassium: 3.4 mEq/L — ABNORMAL LOW (ref 3.5–5.1)
Sodium: 138 mEq/L (ref 136–145)
TOTAL PROTEIN: 7 g/dL (ref 6.4–8.3)

## 2016-01-17 MED ORDER — SODIUM CHLORIDE 0.9 % IV SOLN
Freq: Once | INTRAVENOUS | Status: AC
  Administered 2016-01-17: 12:00:00 via INTRAVENOUS

## 2016-01-17 MED ORDER — DIPHENHYDRAMINE HCL 50 MG/ML IJ SOLN
25.0000 mg | Freq: Once | INTRAMUSCULAR | Status: AC
  Administered 2016-01-17: 25 mg via INTRAVENOUS

## 2016-01-17 MED ORDER — SODIUM CHLORIDE 0.9 % IV SOLN
50.0000 mg/m2 | Freq: Once | INTRAVENOUS | Status: AC
  Administered 2016-01-17: 108 mg via INTRAVENOUS
  Filled 2016-01-17: qty 18

## 2016-01-17 MED ORDER — SODIUM CHLORIDE 0.9% FLUSH
10.0000 mL | INTRAVENOUS | Status: DC | PRN
  Administered 2016-01-17: 10 mL
  Filled 2016-01-17: qty 10

## 2016-01-17 MED ORDER — DIPHENHYDRAMINE HCL 50 MG/ML IJ SOLN
INTRAMUSCULAR | Status: AC
  Filled 2016-01-17: qty 1

## 2016-01-17 MED ORDER — HEPARIN SOD (PORK) LOCK FLUSH 100 UNIT/ML IV SOLN
500.0000 [IU] | Freq: Once | INTRAVENOUS | Status: AC | PRN
Start: 2016-01-17 — End: 2016-01-17
  Administered 2016-01-17: 500 [IU]
  Filled 2016-01-17: qty 5

## 2016-01-17 MED ORDER — SODIUM CHLORIDE 0.9 % IV SOLN
Freq: Once | INTRAVENOUS | Status: AC
  Administered 2016-01-17: 12:00:00 via INTRAVENOUS
  Filled 2016-01-17: qty 4

## 2016-01-17 MED ORDER — FAMOTIDINE IN NACL 20-0.9 MG/50ML-% IV SOLN
INTRAVENOUS | Status: AC
  Filled 2016-01-17: qty 50

## 2016-01-17 MED ORDER — FAMOTIDINE IN NACL 20-0.9 MG/50ML-% IV SOLN
20.0000 mg | Freq: Once | INTRAVENOUS | Status: AC
  Administered 2016-01-17: 20 mg via INTRAVENOUS

## 2016-01-17 NOTE — Patient Instructions (Addendum)
Paclitaxel injection Qu es este medicamento? El PACLITAXEL es un agente quimioteraputico. Este medicamento acta sobre las clulas que se dividen rpidamente, como las clulas cancerosas, y finalmente provoca la muerte de estas clulas. Se utiliza en el tratamiento del cncer de [place], mama y otros tipos de cncer. Este medicamento puede ser utilizado para otros usos; si tiene alguna pregunta consulte con su proveedor de atencin mdica o con su farmacutico. Qu le debo informar a mi profesional de la salud antes de tomar este medicamento? Necesita saber si usted presenta alguno de los siguientes problemas o situaciones: -trastornos sanguneos -pulso cardiaco irregular -infeccin (especialmente infecciones virales, como varicela o herpes) -enfermedad heptica -radioterapia reciente o continua -una reaccin alrgica o inusual al paclitaxel, alcohol, aceite de ricino polioxietilado, otros agentes quimioteraputicos, medicamentos, alimentos, colorantes o conservantes -si est embarazada o buscando quedar embarazada -si est amamantando a un beb Cmo debo utilizar este medicamento? Este medicamento se administra mediante infusin por va intravenosa. Lo administra un profesional de la salud calificado en un hospital o en un entorno clnico. Hable con su pediatra para informarse acerca del uso de este medicamento en nios. Puede requerir atencin especial. Sobredosis: Pngase en contacto inmediatamente con un centro toxicolgico o una sala de urgencia si usted cree que haya tomado demasiado medicamento. ATENCIN: [employer] es solo para usted. No comparta este medicamento con nadie. Qu sucede si me olvido de una dosis? Es importante no olvidar ninguna dosis. Informe a su mdico o a su profesional de la salud si no puede asistir a Photographer. Qu puede interactuar con este medicamento? No tome esta medicina con ninguno de los siguientes medicamentos: -disulfiram -metronidazol Esta  medicina tambin puede interactuar con los siguientes medicamentos: -ciclosporina -diazepam -quetoconazol -medicamentos para incrementar los conteos sanguneos, tales como filgrastim, pegfilgrastim, sargramostim -otros agentes quimioteraputicos, tales como cisplatino, doxorrubicina, epirubicina, etopsido, teniposida, vincristina -quinidina -testosterona -vacunas -verapamilo Consulte a su mdico o a su profesional de la salud antes de tomar cualquiera de los siguientes medicamentos: -acetaminofeno -aspirina -ibuprofeno -quetoprofeno -naproxeno Puede ser que esta lista no menciona todas las posibles interacciones. Informe a su profesional de [employer]	[place] de [employer] productos a base de hierbas, medicamentos de [place] o suplementos nutritivos que est tomando. Si usted fuma, consume bebidas alcohlicas o si utiliza drogas ilegales, indqueselo tambin a su profesional de [employer]	[place]. Algunas sustancias pueden interactuar con su medicamento. A qu debo estar atento al usar [employer]? Se supervisar su estado de salud atentamente mientras reciba este medicamento. Tendr que hacerse anlisis de sangre importantes mientras est tomando este medicamento. Este medicamento puede hacerle sentir un Nurse, mental health. Esto es normal ya que la quimioterapia afecta tanto a las clulas sanas como a las clulas cancerosas. Si presenta alguno de los [employer], infrmelos. Sin embargo, contine con el tratamiento aun si se siente enfermo, a menos que su mdico le indique que lo suspenda. Este medicamento puede causar Chief of Staff graves. Para reducir su riesgo necesitar tomar otro(s) medicamento(s) antes del tratamiento con este medicamento. En algunos casos, podr recibir [employer] para ayudarlo con los efectos secundarios. Siga las instrucciones para usarlos. Consulte a su mdico o a su profesional de la salud si tiene fiebre, escalofros, dolor de garganta o cualquier  otro sntoma de resfro o gripe. No se trate usted mismo. Este medicamento puede reducir la capacidad del cuerpo para combatir infecciones. Trate de no acercarse a personas que estn enfermas. [employer] puede aumentar el riesgo de magulladuras o sangrado. Consulte a su  mdico o a su profesional de la salud si observa sangrados inusuales. Proceda con cuidado al cepillar sus dientes, usar hilo dental o Risk manager palillos para los dientes, ya que puede contraer una infeccin o Therapist, art con mayor facilidad. Si se somete a algn tratamiento dental, informe a su dentista que est [employer]. Evite tomar productos que contienen aspirina, acetaminofeno, ibuprofeno, naproxeno o quetoprofeno a menos que as lo indique su mdico. Estos productos pueden disimular la fiebre. No debe quedar embarazada mientras recibe este medicamento. Las mujeres deben informar a su mdico si estn buscando quedar embarazadas o si creen que estn embarazadas. Existe la posibilidad de que ocurran efectos secundarios graves a un beb sin nacer. Para ms informacin hable con su profesional de la salud o su farmacutico. No debe amamantar a un beb mientras est tomando este medicamento. Para los hombres, se desaconseja tener nios mientras reciben [employer]. Qu efectos secundarios puedo tener al [employer] este medicamento? Efectos secundarios que debe informar a su mdico o a Barrister's clerk de la salud tan pronto como sea posible: Chief of Staff, como erupcin cutnea, picazn o urticarias, hinchazn de la cara, los labios o la lengua conteos sanguneos bajos - este frmaco puede reducir la cantidad de glbulos blancos, glbulos rojos y plaquetas. Su riesgo de infeccin y [place]. signos de infeccin - fiebre o escalofros, tos, dolor de garganta, dolor o dificultad para orinar signos de reduccin de plaquetas o sangrado - magulladuras, puntos rojos en la piel, heces de color oscuro o con  aspecto alquitranado, sangrado por la nariz signos de reduccin de glbulos rojos - cansancio o debilidad inusual, desmayos, sensacin de mareo problemas respiratorios dolor en el pecho alta o baja presin sangunea llagas en la boca nuseas y vmito dolor, hinchazn, enrojecimiento o irritacin en el lugar de la Gaffer, hormigueo, entumecimiento de las manos o los pies pulso cardiaco lento o irregular hinchazn de tobillos, pies, manosEfectos secundarios que, por lo general, no requieren atencin mdica (debe informarlos a su mdico o a su profesional de la salud si persisten o si son molestos): dolor de huesos cada total del cabello, incluyendo el cabello de la cabeza, de las [place], el vello pbico, las cejas y las pestaas cambios en el color de las uas de las manos diarrea aflojamiento de las uas de las manos prdida del apetito dolores musculares o articulares enrojecimiento de la piel sudoracin Puede ser que esta lista no menciona todos los posibles efectos secundarios. Comunquese a su mdico por asesoramiento mdico [employer] Usted puede informar los efectos secundarios a la FDA por telfono al 1-800-FDA-1088. Dnde debo guardar mi medicina? Este medicamento se administra en hospitales o clnicas y no necesitar guardarlo en su domicilio. ATENCIN: Este folleto es un resumen. Puede ser que no cubra toda la posible informacin. Si usted tiene preguntas acerca de esta medicina, consulte con su mdico, su farmacutico o su profesional de Technical sales engineer.    2016, Elsevier/[employer] Standard. (2014-08-25 00:00:00)

## 2016-01-21 ENCOUNTER — Ambulatory Visit
Admission: RE | Admit: 2016-01-21 | Discharge: 2016-01-21 | Disposition: A | Discharge status: Home / Self Care | Payer: No Typology Code available for payment source | Source: Ambulatory Visit | Attending: Hematology and Oncology | Admitting: Hematology and Oncology

## 2016-01-21 DIAGNOSIS — C50811 Malignant neoplasm of overlapping sites of right female breast: Secondary | ICD-10-CM

## 2016-01-21 DIAGNOSIS — C50812 Malignant neoplasm of overlapping sites of left female breast: Principal | ICD-10-CM

## 2016-01-21 MED ORDER — GADOBENATE DIMEGLUMINE 529 MG/ML IV SOLN
20.0000 mL | Freq: Once | INTRAVENOUS | Status: AC | PRN
  Administered 2016-01-21: 20 mL via INTRAVENOUS

## 2016-01-23 NOTE — Assessment & Plan Note (Signed)
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%; HER-2 negative ratio 1.04; 10:00: IDC grade 2-3 dissimilar to 1:00 biopsy 10:00: ER 95%, PR 0%, K67: 25%, HER-2 positiveratio 6.42 right axillary lymph node positive for metastatic cancer (LN is Her 2 neg) --------------------------------------------------------------------------------- Left breast LIQ 08/20/2015: 4.2 cm, 4 mm oval mass 12:00, 1.2 cm mass left UOQ, enlarged left axillary lymph node, T2 N1 stage II B clinical stage  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 CT CAP and Bone scan 08/27/15: Bil Breast masses, Bil Axill LN, Bil lower lobe lung nodules indeterminate 7 mm  --------------------------------------------------------------------------------------------------------------------------------------- Treatment plan: 1. Neoadjuvant chemotherapy with either AC followed by THP started 09/13/2015 and completed 12 weeks of Taxol 11/29/2015, Herceptin Perjeta 6 2. Followed by bilateral mastectomies and lymph node surgery 3. Followed by adjuvant radiation 4. 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 We'll monitor this very closely.  Return to clinic in 3weeks for Herceptin and Perjeta cycle 6 Patient will need an appointment with Dr. Donne Hazel to talk about bilateral mastectomies with bilateral axillary lymph node dissection.  Patient will need adjuvant Herceptin and Perjeta maintenance and even consideration for Neratinib after the maintenance therapy is complete.

## 2016-01-23 NOTE — Progress Notes (Signed)
Patient Care Team: No Pcp Per Patient as PCP - General (General Practice)  SUMMARY OF ONCOLOGIC HISTORY:   Bilateral breast cancer ([place])   08/16/2015 Mammogram    Left breast LIQ 4.2 cm, 4 mm oval mass 12:00, 1.2 cm mass left UOQ, enlarged left axillary lymph node      08/16/2015 Mammogram    Right breast UIQ: 3.9 cm irregular mass, 1.5 cm mass in the right LOQ, 9 mm mass right UOQ, 6 mm mass right LIQ, enlarged lymph node Rt axilla      08/20/2015 Initial Diagnosis    Right breast biopsy 1:00: IDC grade 2 ER 95%, PR 2%, Ki 67 15%; HER-2 negative ratio 1.04; 10:00: IDC grade 2-3 dissimilar to 1:00 biopsy 10:00: ER 95%, PR 0%, K67: 25%, HER-2 positive ratio 6.42 right axillary lymph node positive for metastatic cancer      08/20/2015 Procedure    Left biopsy 8:00: IDC with DCIS, grade 2; ER 95%, PR 95%, Ki-67 30%, HER-2 negative ratio 1.2; 12:00: IDC with DCIS, grade 1 ER 95%, PR 95%, Ki-67 10% HER-2 negative ratio 1.14 not similar to this biopsy, left axillary lymph node positive,       09/13/2015 -  Neo-Adjuvant Chemotherapy    Neoadjuvant dose dense Adriamycin and Cytoxan followed by Taxol Herceptin Perjeta      01/21/2016 Breast MRI    Bilateral breast masses significantly decreased in size with mild residual NME, complete resolution of numerous other nodules in both breasts, bilateral axillary lymph nodes and bilateral retropectoral lymph nodes decreased in size       CHIEF COMPLIANT: Follow-up to review breast MRI  INTERVAL HISTORY: Felicia [name] is a 55 year old with above-mentioned history of bilateral breast cancers was currently on neoadjuvant chemotherapy. She finished dose dense Adriamycin Cytoxan 4. She finished 12 weeks of Taxol along with Herceptin and Perjeta. She underwent a breast MRI and is here today to discuss the results. She tolerated the treatment extremely well. She did have mild neuropathy in the fingers.  REVIEW OF SYSTEMS:   Constitutional: Denies  fevers, chills or abnormal weight loss Eyes: Denies blurriness of vision Ears, nose, mouth, throat, and face: Denies mucositis or sore throat Respiratory: Denies cough, dyspnea or wheezes Cardiovascular: Denies palpitation, chest discomfort Gastrointestinal:  Denies nausea, heartburn or change in bowel habits Skin: Denies abnormal skin rashes Lymphatics: Denies new lymphadenopathy or easy bruising Neurological:Denies numbness, tingling or new weaknesses Behavioral/Psych: Mood is stable, no new changes  Extremities: No lower extremity edema Breast: denies any pain or lumps or nodules in either breasts All other systems were reviewed with the patient and are negative.  I have reviewed the past medical history, past surgical history, social history and family history with the patient and they are unchanged from previous note.  ALLERGIES:  has No Known Allergies.  MEDICATIONS:  Current Outpatient Prescriptions  Medication Sig Dispense Refill  . gabapentin (NEURONTIN) 100 MG capsule Take 1 capsule ('100mg'$ ) by mouth three times per day for 1 week.  Increase to 2 capsules ('200mg'$ ) by mouth three times per day for 1 week. Then increase to 3 capsules ('300mg'$ ) by mouth three times per day. (Patient taking differently: Take 100 mg by mouth at bedtime. Take 1 capsule ('100mg'$ ) by mouth three times per day for 1 week.  Increase to 2 capsules ('200mg'$ ) by mouth three times per day for 1 week. Then increase to 3 capsules ('300mg'$ ) by mouth three times per day.) 130 capsule 0  . HYDROcodone-acetaminophen ([place])  10-325 MG tablet Take 1 tablet by mouth every 6 (six) hours as needed. 10 tablet 0  . lidocaine-prilocaine (EMLA) cream Apply 1 application topically as needed (Apply 2- hours prior to access of port).    . LORazepam (ATIVAN) 0.5 MG tablet Take 0.5 mg by mouth every 6 (six) hours as needed for anxiety.    . magic mouthwash w/lidocaine SOLN Take 5 mLs by mouth 3 (three) times daily as needed for mouth pain.  100 mL 0  . naproxen (NAPROSYN) 500 MG tablet Take 500 mg by mouth as needed.     Marland Kitchen omeprazole (PRILOSEC) 20 MG capsule Take 1 capsule (20 mg total) by mouth daily. 30 capsule 0  . potassium chloride SA (K-DUR,KLOR-CON) 20 MEQ tablet Take 1 tablet (20 mEq total) by mouth daily. 30 tablet 0  . prochlorperazine (COMPAZINE) 10 MG tablet Take 10 mg by mouth every 6 (six) hours as needed for nausea or vomiting.     No current facility-administered medications for this visit.    Facility-Administered Medications Ordered in Other Visits  Medication Dose Route Frequency Provider Last Rate Last Dose  . heparin lock flush 100 unit/mL  500 Units Intracatheter Once PRN Serena Croissant, MD      . sodium chloride flush (NS) 0.9 % injection 10 mL  10 mL Intracatheter PRN Serena Croissant, MD      . trastuzumab (HERCEPTIN) 588 mg in sodium chloride 0.9 % 250 mL chemo infusion  6 mg/kg (Treatment Plan Recorded) Intravenous Once Serena Croissant, MD        PHYSICAL EXAMINATION: ECOG PERFORMANCE STATUS: 1 - Symptomatic but completely ambulatory  Vitals:   01/24/16 1001  BP: (!) 120/91  Pulse: 78  Resp: 18  Temp: 97.6 F (36.4 C)   Filed Weights   01/24/16 1001  Weight: 219 lb 1.6 oz (99.4 kg)    GENERAL:alert, no distress and comfortable SKIN: skin color, texture, turgor are normal, no rashes or significant lesions EYES: normal, Conjunctiva are pink and non-injected, sclera clear OROPHARYNX:no exudate, no erythema and lips, buccal mucosa, and tongue normal  NECK: supple, thyroid normal size, non-tender, without nodularity LYMPH:  no palpable lymphadenopathy in the cervical, axillary or inguinal LUNGS: clear to auscultation and percussion with normal breathing effort HEART: regular rate & rhythm and no murmurs and no lower extremity edema ABDOMEN:abdomen soft, non-tender and normal bowel sounds MUSCULOSKELETAL:no cyanosis of digits and no clubbing  NEURO: alert & oriented x 3 with fluent speech, no focal  motor/sensory deficits EXTREMITIES: No lower extremity edema BREAST: No palpable masses or nodules in either right or left breasts. No palpable axillary supraclavicular or infraclavicular adenopathy no breast tenderness or nipple discharge. (exam performed in the presence of a chaperone)  LABORATORY DATA:  I have reviewed the data as listed   Chemistry      Component Value Date/Time   NA 141 01/24/2016 0946   K 3.6 01/24/2016 0946   CO2 22 01/24/2016 0946   BUN 8.4 01/24/2016 0946   CREATININE 0.7 01/24/2016 0946      Component Value Date/Time   CALCIUM 9.6 01/24/2016 0946   ALKPHOS 71 01/24/2016 0946   AST 63 (H) 01/24/2016 0946   ALT 70 (H) 01/24/2016 0946   BILITOT 0.68 01/24/2016 0946       Lab Results  Component Value Date   WBC 4.6 01/24/2016   HGB 12.5 01/24/2016   HCT 36.9 01/24/2016   MCV 90.2 01/24/2016   PLT 231 01/24/2016   NEUTROABS  2.3 01/24/2016     ASSESSMENT & PLAN:  Bilateral breast cancer ([place]) 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%; HER-2 negative ratio 1.04; 10:00: IDC grade 2-3 dissimilar to 1:00 biopsy 10:00: ER 95%, PR 0%, K67: 25%, HER-2 positiveratio 6.42 right axillary lymph node positive for metastatic cancer (LN is Her 2 neg) --------------------------------------------------------------------------------- Left breast LIQ 08/20/2015: 4.2 cm, 4 mm oval mass 12:00, 1.2 cm mass left UOQ, enlarged left axillary lymph node, T2 N1 stage II B clinical stage  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 CT CAP and Bone scan 08/27/15: Bil Breast masses,  Bil Axill LN, Bil lower lobe lung nodules indeterminate 7 mm  --------------------------------------------------------------------------------------------------------------------------------------- Treatment plan: 1. Neoadjuvant chemotherapy with either AC followed by THP started 09/13/2015 and completed 12 weeks of Taxol 11/29/2015, Herceptin Perjeta 6 2. Followed by bilateral mastectomies and lymph node surgery 3. Followed by adjuvant radiation 4. 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 We'll monitor this very closely.  Return to clinic in 3weeks for Herceptin and Perjeta cycle 6 Patient will need an appointment with Dr. Donne Hazel to talk about bilateral mastectomies with bilateral axillary lymph node dissection.  Financial counseling: I advised the patient to meet with the financial counselors to discuss about the cost of medical care. She is very worried about the surgery bills.  Patient will need adjuvant Herceptin and Perjeta maintenance and even consideration for Neratinib after the maintenance therapy is complete.   Orders Placed This Encounter  Procedures  . PHYSICIAN COMMUNICATION ORDER    A baseline Echo/Muga should be obtained prior to initiation of Herceptin, at 3, 6, 9 months during Herceptin  Treatment.   The patient has a good understanding of the  overall plan. she agrees with it. she will call with any problems that may develop before the next visit here.   Rulon Eisenmenger, MD 01/24/16

## 2016-01-24 ENCOUNTER — Encounter: Payer: Self-pay | Admitting: Hematology and Oncology

## 2016-01-24 ENCOUNTER — Ambulatory Visit (HOSPITAL_BASED_OUTPATIENT_CLINIC_OR_DEPARTMENT_OTHER): Payer: Self-pay | Admitting: Hematology and Oncology

## 2016-01-24 ENCOUNTER — Other Ambulatory Visit (HOSPITAL_BASED_OUTPATIENT_CLINIC_OR_DEPARTMENT_OTHER): Payer: Self-pay

## 2016-01-24 ENCOUNTER — Encounter: Payer: Self-pay | Admitting: *Deleted

## 2016-01-24 ENCOUNTER — Ambulatory Visit (HOSPITAL_BASED_OUTPATIENT_CLINIC_OR_DEPARTMENT_OTHER): Payer: Self-pay

## 2016-01-24 VITALS — BP 120/91 | HR 78 | Temp 97.6°F | Resp 18 | Wt 219.1 lb

## 2016-01-24 DIAGNOSIS — C50312 Malignant neoplasm of lower-inner quadrant of left female breast: Secondary | ICD-10-CM

## 2016-01-24 DIAGNOSIS — Z5112 Encounter for antineoplastic immunotherapy: Secondary | ICD-10-CM

## 2016-01-24 DIAGNOSIS — C50211 Malignant neoplasm of upper-inner quadrant of right female breast: Secondary | ICD-10-CM

## 2016-01-24 DIAGNOSIS — T451X5A Adverse effect of antineoplastic and immunosuppressive drugs, initial encounter: Secondary | ICD-10-CM

## 2016-01-24 DIAGNOSIS — C50812 Malignant neoplasm of overlapping sites of left female breast: Secondary | ICD-10-CM

## 2016-01-24 DIAGNOSIS — C50411 Malignant neoplasm of upper-outer quadrant of right female breast: Secondary | ICD-10-CM

## 2016-01-24 DIAGNOSIS — C50811 Malignant neoplasm of overlapping sites of right female breast: Secondary | ICD-10-CM

## 2016-01-24 DIAGNOSIS — G620 Drug-induced polyneuropathy: Secondary | ICD-10-CM

## 2016-01-24 LAB — COMPREHENSIVE METABOLIC PANEL
ALBUMIN: 3.6 g/dL (ref 3.5–5.0)
ALT: 70 U/L — ABNORMAL HIGH (ref 0–55)
ANION GAP: 10 meq/L (ref 3–11)
AST: 63 U/L — ABNORMAL HIGH (ref 5–34)
Alkaline Phosphatase: 71 U/L (ref 40–150)
BILIRUBIN TOTAL: 0.68 mg/dL (ref 0.20–1.20)
BUN: 8.4 mg/dL (ref 7.0–26.0)
CO2: 22 mEq/L (ref 22–29)
Calcium: 9.6 mg/dL (ref 8.4–10.4)
Chloride: 109 mEq/L (ref 98–109)
Creatinine: 0.7 mg/dL (ref 0.6–1.1)
GLUCOSE: 112 mg/dL (ref 70–140)
POTASSIUM: 3.6 meq/L (ref 3.5–5.1)
SODIUM: 141 meq/L (ref 136–145)
TOTAL PROTEIN: 7 g/dL (ref 6.4–8.3)

## 2016-01-24 LAB — CBC WITH DIFFERENTIAL/PLATELET
BASO%: 0.4 % (ref 0.0–2.0)
BASOS ABS: 0 10*3/uL (ref 0.0–0.1)
EOS ABS: 0 10*3/uL (ref 0.0–0.5)
EOS%: 0.7 % (ref 0.0–7.0)
HCT: 36.9 % (ref 34.8–46.6)
HEMOGLOBIN: 12.5 g/dL (ref 11.6–15.9)
LYMPH%: 37.9 % (ref 14.0–49.7)
MCH: 30.6 pg (ref 25.1–34.0)
MCHC: 33.9 g/dL (ref 31.5–36.0)
MCV: 90.2 fL (ref 79.5–101.0)
MONO#: 0.5 10*3/uL (ref 0.1–0.9)
MONO%: 10.5 % (ref 0.0–14.0)
NEUT%: 50.5 % (ref 38.4–76.8)
NEUTROS ABS: 2.3 10*3/uL (ref 1.5–6.5)
PLATELETS: 231 10*3/uL (ref 145–400)
RBC: 4.09 10*6/uL (ref 3.70–5.45)
RDW: 13.2 % (ref 11.2–14.5)
WBC: 4.6 10*3/uL (ref 3.9–10.3)
lymph#: 1.7 10*3/uL (ref 0.9–3.3)

## 2016-01-24 MED ORDER — DIPHENHYDRAMINE HCL 25 MG PO CAPS
50.0000 mg | ORAL_CAPSULE | Freq: Once | ORAL | Status: AC
  Administered 2016-01-24: 50 mg via ORAL

## 2016-01-24 MED ORDER — DIPHENHYDRAMINE HCL 50 MG/ML IJ SOLN
INTRAMUSCULAR | Status: AC
  Filled 2016-01-24: qty 1

## 2016-01-24 MED ORDER — TRASTUZUMAB CHEMO 150 MG IV SOLR
6.0000 mg/kg | Freq: Once | INTRAVENOUS | Status: AC
  Administered 2016-01-24: 588 mg via INTRAVENOUS
  Filled 2016-01-24: qty 28

## 2016-01-24 MED ORDER — HEPARIN SOD (PORK) LOCK FLUSH 100 UNIT/ML IV SOLN
500.0000 [IU] | Freq: Once | INTRAVENOUS | Status: AC | PRN
  Administered 2016-01-24: 500 [IU]
  Filled 2016-01-24: qty 5

## 2016-01-24 MED ORDER — DIPHENHYDRAMINE HCL 25 MG PO CAPS
ORAL_CAPSULE | ORAL | Status: AC
  Filled 2016-01-24: qty 2

## 2016-01-24 MED ORDER — SODIUM CHLORIDE 0.9 % IV SOLN
Freq: Once | INTRAVENOUS | Status: AC
  Administered 2016-01-24: 12:00:00 via INTRAVENOUS

## 2016-01-24 MED ORDER — ACETAMINOPHEN 325 MG PO TABS
650.0000 mg | ORAL_TABLET | Freq: Once | ORAL | Status: AC
  Administered 2016-01-24: 650 mg via ORAL

## 2016-01-24 MED ORDER — ACETAMINOPHEN 325 MG PO TABS
ORAL_TABLET | ORAL | Status: AC
  Filled 2016-01-24: qty 2

## 2016-01-24 MED ORDER — SODIUM CHLORIDE 0.9% FLUSH
10.0000 mL | INTRAVENOUS | Status: DC | PRN
  Administered 2016-01-24: 10 mL
  Filled 2016-01-24: qty 10

## 2016-01-24 NOTE — Patient Instructions (Signed)
[facility] Discharge Instructions for Patients Receiving Chemotherapy  Today you received the following chemotherapy agents Herceptin To help prevent nausea and vomiting after your treatment, we encourage you to take your nausea medication as directed.   If you develop nausea and vomiting that is not controlled by your nausea medication, call the clinic.   BELOW ARE SYMPTOMS THAT SHOULD BE REPORTED IMMEDIATELY:  *FEVER GREATER THAN 100.5 F  *CHILLS WITH OR WITHOUT FEVER  NAUSEA AND VOMITING THAT IS NOT CONTROLLED WITH YOUR NAUSEA MEDICATION  *UNUSUAL SHORTNESS OF BREATH  *UNUSUAL BRUISING OR BLEEDING  TENDERNESS IN MOUTH AND THROAT WITH OR WITHOUT PRESENCE OF ULCERS  *URINARY PROBLEMS  *BOWEL PROBLEMS  UNUSUAL RASH Items with * indicate a potential emergency and should be followed up as soon as possible.  Feel free to call the clinic you have any questions or concerns. The clinic phone number is [phone].  Please show the [place] at check-in to the Emergency Department and triage nurse.  Trastuzumab injection for infusion Qu es este medicamento? El TRASTUZUMAB es un anticuerpo monoclonal. Genene Churn para tratar el cncer de mama y de [place]. Este medicamento puede ser utilizado para otros usos; si tiene alguna pregunta consulte con su proveedor de atencin mdica o con su farmacutico. Qu le debo informar a mi profesional de la salud antes de tomar este medicamento? Necesita saber si usted presenta alguno de los siguientes problemas o situaciones: -enfermedad cardiaca -insuficiencia cardiaca -infeccin (especialmente infecciones virales, como la varicela o el herpes) -enfermedad pulmonar o respiratoria, como asma -radioterapia reciente o continuada -una reaccin alrgica o inusual al trastuzumab, al alcohol benclico, a otros medicamentos, alimentos, colorantes o conservantes -si est embarazada o buscando quedar embarazada -si est  amamantando a un beb Cmo debo utilizar este medicamento? Este medicamento se administra mediante infusin por va intravenosa. Lo administra un profesional de la salud calificado en un hospital o en un entorno clnico. Hable con su pediatra para informarse acerca del uso de este medicamento en nios. Este medicamento no est aprobado para uso en nios. Sobredosis: Pngase en contacto inmediatamente con un centro toxicolgico o una sala de urgencia si usted cree que haya tomado demasiado medicamento. ATENCIN: [employer] es solo para usted. No comparta este medicamento con nadie. Qu sucede si me olvido de una dosis? Es importante no olvidar ninguna dosis. Informe a su mdico o a su profesional de la salud si no puede asistir a Photographer. Qu puede interactuar con este medicamento? -doxorubicina -warfarina Puede ser que esta lista no menciona todas las posibles interacciones. Informe a su profesional de KB Home	[place] de [employer] productos a base de hierbas, medicamentos de [place] o suplementos nutritivos que est tomando. Si usted fuma, consume bebidas alcohlicas o si utiliza drogas ilegales, indqueselo tambin a su profesional de KB Home	[place]. Algunas sustancias pueden interactuar con su medicamento. A qu debo estar atento al usar Coca-Cola? Visite a su mdico para chequear su evolucin peridicamente. Si presenta algn efecto secundario, infrmelo. Sin embargo, contine con el tratamiento aun si se siente enfermo, a menos que su mdico le indique que lo suspenda. Consulte a su mdico o a su profesional de la salud si tiene fiebre, escalofros, dolor de garganta, o cualquier otro sntoma de resfro o gripe. No se trate usted mismo. Trate de no acercarse a personas que estn enfermas. Es posible que tenga [place], escalofros y temblores durante su primera infusin. Estos efectos generalmente son leves y pueden tratarse  con otros medicamentos. Informe cualquier efecto secundario  durante la infusin a su profesional de KB Home	[place]. La fiebre y los escalofros por lo general no suceden con las infusiones posteriores. No debe quedar embarazada mientras est tomando este medicamento o por 7 meses despus de dejar de usarlo. Las mujeres deben informar a su mdico si estn buscando quedar embarazadas o si creen que estn embarazadas. Las mujeres con la posibilidad de Best boy nios deben tener una prueba de embarazo negativa antes de Art gallery manager a tomar este medicamento. Existe la posibilidad de que ocurran efectos secundarios graves a un beb sin nacer. Para ms informacin hable con su profesional de la salud o su farmacutico. No debe amamantar a un beb mientras est tomando este medicamento o por 7 meses despus de dejar de usarlo. Las [employer] deben usar un mtodo anticonceptivo eficaz con este medicamento. Qu efectos secundarios puedo tener al [employer] este medicamento? Efectos secundarios que debe informar a su mdico o a Office manager de la salud tan pronto como sea posible: -problemas respiratorios -Tourist information centre manager o palpitaciones -tos -mareos o desmayos -fiebre o escalofros, dolor de garganta -erupcin cutnea, picazn o urticarias -hinchazn de los tobillos o las piernas -cansancio o debilidad inusual Efectos secundarios que, por lo general, no requieren Geophysical data processor (debe informarlos a su mdico u otro profesional de la salud si persisten o si son molestos): -prdida del apetito -dolor de cabeza -dolores musculares -nuseas Puede ser que esta lista no menciona todos los posibles efectos secundarios. Comunquese a su mdico por asesoramiento mdico [employer]. Usted puede informar los efectos secundarios a la FDA por telfono al 1-800-FDA-1088. Dnde debo guardar mi medicina? Este medicamento se administra en hospitales o clnicas y no necesitar guardarlo en su domicilio. ATENCIN: Este folleto es un resumen. Puede ser que no cubra toda la  posible informacin. Si usted tiene preguntas acerca de esta medicina, consulte con su mdico, su farmacutico o su profesional de Technical sales engineer.    2016, Elsevier/Gold Standard. (2014-09-14 00:00:00)  Pertuzumab injection Qu es este medicamento? El PERTUZUMAB es un anticuerpo monoclonal que acta sobre una protena llamada HER2. HER2 se encuentra en algunos cnceres de mama. Este medicamento puede detener el crecimiento de las clulas cancerosas. Este [employer] se puede usar con otros tratamientos para Science writer. Este medicamento puede ser utilizado para otros usos; si tiene alguna pregunta consulte con su proveedor de atencin mdica o con su farmacutico. Qu le debo informar a mi profesional de la salud antes de tomar este medicamento? Necesita saber si usted presenta alguno de los siguientes problemas o situaciones: enfermedad cardiaca insuficiencia cardiaca alta presin sangunea antecedentes de pulso cardiaca irregular radioterapia reciente o continuada una reaccin alrgica o inusual al pertuzumab, a otros medicamentos, alimentos, colorantes o conservantes si est embarazada o buscando quedar embarazada si est amamantando a un beb Cmo debo utilizar este medicamento? Este medicamento se administra mediante infusin por va intravenosa. Lo administra un profesional de Technical sales engineer en un hospital o en un entorno clnico. Hable con su pediatra para informarse acerca del uso de este medicamento en nios. Puede requerir atencin especial. Sobredosis: Pngase en contacto inmediatamente con un centro toxicolgico o una sala de urgencia si usted cree que haya tomado demasiado medicamento. ATENCIN: [employer] es solo para usted. No comparta este medicamento con nadie. Qu sucede si me olvido de una dosis? Es importante no olvidar ninguna dosis. Informe a su mdico o a su profesional de la salud si no puede asistir a  una cita. Qu puede interactuar con este medicamento? No se esperan  interacciones. Puede ser que esta lista no menciona todas las posibles interacciones. Informe a su profesional de KB Home	[place] de [employer] productos a base de hierbas, medicamentos de [place] o suplementos nutritivos que est tomando. Si usted fuma, consume bebidas alcohlicas o si utiliza drogas ilegales, indqueselo tambin a su profesional de KB Home	[place]. Algunas sustancias pueden interactuar con su medicamento. A qu debo estar atento al usar Coca-Cola? Se supervisar su estado de salud atentamente mientras reciba este medicamento. Si presenta algn efecto secundario, infrmelo. Sin embargo, contine con el tratamiento aun si se siente enfermo, a menos que su mdico le indique que lo suspenda. No debe quedar embarazada mientras est tomando este medicamento o por 7 meses despus de dejar de usarlo. Las mujeres deben informar a su mdico si estn buscando quedar embarazadas o si creen que estn embarazadas. Las mujeres con la posibilidad de Best boy nios deben tener una prueba de embarazo negativa antes de Art gallery manager a tomar este medicamento. Existe la posibilidad de que ocurran efectos secundarios graves a un beb sin nacer. Para ms informacin hable con su profesional de la salud o su farmacutico. No debe amamantar a un beb mientras est tomando este medicamento o por 7 meses despus de dejar de usarlo. Las [employer] deben usar un mtodo anticonceptivo eficaz con este medicamento. Consulte a su mdico o a su profesional de la salud si tiene fiebre, escalofros, dolor de garganta, o cualquier otro sntoma de resfro o gripe. No se trate usted mismo. Trate de no acercarse a personas que estn enfermas. Es posible que tenga [place], escalofros y dolor de cabeza durante la infusin. Informe cualquier efecto secundario durante la infusin a su profesional de KB Home	[place]. Qu efectos secundarios puedo tener al [employer] este medicamento? Efectos secundarios que debe informar a su mdico o a Barrister's clerk de  la salud tan pronto como sea posible: Arboriculturist o palpitaciones mareos sensacin de Youth worker o aturdimiento fiebre o escalofros erupcin cutnea, picazn o urticarias dolor de garganta hinchazn de la cara, labio o lengua hinchazn de las piernas o tobillos cansancio o debilidad inusual Efectos secundarios que, por lo general, no requieren atencin mdica (debe informarlos a su mdico o a su profesional de la salud si persisten o si son molestos): diarrea cada del cabello nuseas, vmito somnolencia Puede ser que esta lista no menciona todos los posibles efectos secundarios. Comunquese a su mdico por asesoramiento mdico [employer]. Usted puede informar los efectos secundarios a la FDA por telfono al 1-800-FDA-1088. Dnde debo guardar mi medicina? Este medicamento se administra en hospitales o clnicas y no necesitar guardarlo en su domicilio. ATENCIN: Este folleto es un resumen. Puede ser que no cubra toda la posible informacin. Si usted tiene preguntas acerca de esta medicina, consulte con su mdico, su farmacutico o su profesional de Technical sales engineer.    2016, Elsevier/Gold Standard. (2014-09-14 00:00:00)   Paclitaxel injection Qu es este medicamento? El PACLITAXEL es un agente quimioteraputico. Este medicamento acta sobre las clulas que se dividen rpidamente, como las clulas cancerosas, y finalmente provoca la muerte de estas clulas. Se utiliza en el tratamiento del cncer de [place], mama y otros tipos de cncer. Este medicamento puede ser utilizado para otros usos; si tiene alguna pregunta consulte con su proveedor de atencin mdica o con su farmacutico. Qu le debo informar a mi profesional de la salud antes de tomar este medicamento? Necesita  saber si usted presenta alguno de los siguientes problemas o situaciones: -trastornos sanguneos -pulso cardiaco irregular -infeccin (especialmente infecciones virales, como varicela o  herpes) -enfermedad heptica -radioterapia reciente o continua -una reaccin alrgica o inusual al paclitaxel, alcohol, aceite de ricino polioxietilado, otros agentes quimioteraputicos, medicamentos, alimentos, colorantes o conservantes -si est embarazada o buscando quedar embarazada -si est amamantando a un beb Cmo debo utilizar este medicamento? Este medicamento se administra mediante infusin por va intravenosa. Lo administra un profesional de la salud calificado en un hospital o en un entorno clnico. Hable con su pediatra para informarse acerca del uso de este medicamento en nios. Puede requerir atencin especial. Sobredosis: Pngase en contacto inmediatamente con un centro toxicolgico o una sala de urgencia si usted cree que haya tomado demasiado medicamento. ATENCIN: [employer] es solo para usted. No comparta este medicamento con nadie. Qu sucede si me olvido de una dosis? Es importante no olvidar ninguna dosis. Informe a su mdico o a su profesional de la salud si no puede asistir a Photographer. Qu puede interactuar con este medicamento? No tome esta medicina con ninguno de los siguientes medicamentos: -disulfiram -metronidazol Esta medicina tambin puede interactuar con los siguientes medicamentos: -ciclosporina -diazepam -quetoconazol -medicamentos para incrementar los conteos sanguneos, tales como filgrastim, pegfilgrastim, sargramostim -otros agentes quimioteraputicos, tales como cisplatino, doxorrubicina, epirubicina, etopsido, teniposida, vincristina -quinidina -testosterona -vacunas -verapamilo Consulte a su mdico o a su profesional de la salud antes de tomar cualquiera de los siguientes medicamentos: -acetaminofeno -aspirina -ibuprofeno -quetoprofeno -naproxeno Puede ser que esta lista no menciona todas las posibles interacciones. Informe a su profesional de KB Home	[place] de [employer] productos a base de hierbas, medicamentos de [place] o  suplementos nutritivos que est tomando. Si usted fuma, consume bebidas alcohlicas o si utiliza drogas ilegales, indqueselo tambin a su profesional de KB Home	[place]. Algunas sustancias pueden interactuar con su medicamento. A qu debo estar atento al usar Coca-Cola? Se supervisar su estado de salud atentamente mientras reciba este medicamento. Tendr que hacerse anlisis de sangre importantes mientras est tomando este medicamento. Este medicamento puede hacerle sentir un Nurse, mental health. Esto es normal ya que la quimioterapia afecta tanto a las clulas sanas como a las clulas cancerosas. Si presenta alguno de los [employer], infrmelos. Sin embargo, contine con el tratamiento aun si se siente enfermo, a menos que su mdico le indique que lo suspenda. Este medicamento puede causar Chief of Staff graves. Para reducir su riesgo necesitar tomar otro(s) medicamento(s) antes del tratamiento con este medicamento. En algunos casos, podr recibir [employer] para ayudarlo con los efectos secundarios. Siga las instrucciones para usarlos. Consulte a su mdico o a su profesional de la salud si tiene fiebre, escalofros, dolor de garganta o cualquier otro sntoma de resfro o gripe. No se trate usted mismo. Este medicamento puede reducir la capacidad del cuerpo para combatir infecciones. Trate de no acercarse a personas que estn enfermas. [employer] puede aumentar el riesgo de magulladuras o sangrado. Consulte a su mdico o a su profesional de la salud si observa sangrados inusuales. Proceda con cuidado al cepillar sus dientes, usar hilo dental o Risk manager palillos para los dientes, ya que puede contraer una infeccin o Therapist, art con mayor facilidad. Si se somete a algn tratamiento dental, informe a su dentista que est [employer]. Evite tomar productos que contienen aspirina, acetaminofeno, ibuprofeno, naproxeno o quetoprofeno a menos que as lo indique su  mdico. Estos productos pueden disimular la fiebre. No debe quedar embarazada mientras recibe [facility]  medicamento. Las mujeres deben informar a su mdico si estn buscando quedar embarazadas o si creen que estn embarazadas. Existe la posibilidad de que ocurran efectos secundarios graves a un beb sin nacer. Para ms informacin hable con su profesional de la salud o su farmacutico. No debe amamantar a un beb mientras est tomando este medicamento. Para los hombres, se desaconseja tener nios mientras reciben Coca-Cola. Qu efectos secundarios puedo tener al [employer] este medicamento? Efectos secundarios que debe informar a su mdico o a Barrister's clerk de la salud tan pronto como sea posible: Chief of Staff, como erupcin cutnea, picazn o urticarias, hinchazn de la cara, los labios o la lengua conteos sanguneos bajos - este frmaco puede reducir la cantidad de glbulos blancos, glbulos rojos y plaquetas. Su riesgo de infeccin y [place]. signos de infeccin - fiebre o escalofros, tos, dolor de garganta, dolor o dificultad para orinar signos de reduccin de plaquetas o sangrado - magulladuras, puntos rojos en la piel, heces de color oscuro o con aspecto alquitranado, sangrado por la nariz signos de reduccin de glbulos rojos - cansancio o debilidad inusual, desmayos, sensacin de mareo problemas respiratorios dolor en el pecho alta o baja presin sangunea llagas en la boca nuseas y vmito dolor, hinchazn, enrojecimiento o irritacin en el lugar de la [place], hormigueo, entumecimiento de las manos o los pies pulso cardiaco lento o irregular hinchazn de tobillos, pies, manosEfectos secundarios que, por lo general, no requieren atencin mdica (debe informarlos a su mdico o a su profesional de la salud si persisten o si son molestos): dolor de huesos cada total del cabello, incluyendo el cabello de la cabeza, de las [place], el vello pbico, las cejas y las pestaas  cambios en el color de las uas de las manos diarrea aflojamiento de las uas de las manos prdida del apetito dolores musculares o articulares enrojecimiento de la piel sudoracin Puede ser que esta lista no menciona todos los posibles efectos secundarios. Comunquese a su mdico por asesoramiento mdico [employer]. Usted puede informar los efectos secundarios a la FDA por telfono al 1-800-FDA-1088. Dnde debo guardar mi medicina? Este medicamento se administra en hospitales o clnicas y no necesitar guardarlo en su domicilio. ATENCIN: Este folleto es un resumen. Puede ser que no cubra toda la posible informacin. Si usted tiene preguntas acerca de esta medicina, consulte con su mdico, su farmacutico o su profesional de Technical sales engineer.    2016, Elsevier/Gold Standard. (2014-08-25 00:00:00)  Contenido de potasio de los alimentos (Potassium Content of Foods) El potasio es un mineral que se encuentra en muchos alimentos y bebidas. Ayuda a Contractor equilibrio de lquidos y [employer] organismo, e influye en la regularidad con la que el corazn late. El potasio tambin ayuda a Chief Technology Officer la presin arterial y a Electrical engineer y [place]. Algunos medicamentos y afecciones pueden cambiar el equilibrio de potasio en el cuerpo. Cuando esto sucede, puede ayudar a [employer] de potasio a travs de los alimentos que consume. El mdico o el nutricionista le recomendarn la cantidad de potasio que debe ingerir por Training and development officer. Las siguientes listas proporcionan la cantidad de potasio (entre parntesis) por porcin en cada alimento. CON ALTO CONTENIDO DE POTASIO  Los siguientes alimentos y bebidas tienen 200 mg o ms de potasio por porcin:  Damascos, 2 crudos o 5 secos (200 mg)  Alcachofa, 1 mediana (345 mg)  Aguacate, crudo, 1/4 (245 mg)  Banana, 1 mediana (425  mg)  Frijoles, lima o frijoles en salsa de tomates, enlatados, 1/2 taza (280  mg)  Frijoles blancos, enlatados, 1/2 taza (595 mg)  Carne asada, 3 onzas/85 g (320 mg)  Carne molida, 3 onzas/85 g (270 mg)  Remolachas, crudas o cocidas, 1/2 taza (260 mg)  Bollitos de salvado, 2 onzas/57 g (300 mg)  Brcoli, 1/2 taza (230 mg)  Repollitos de Bruselas, 1/2 taza (250 mg)  Meln, 1/2 taza (215 mg)  Cereales, 100 % de salvado, 1/2 taza (200 a 400 mg)  [place] de queso, sola, comida rpida, 1 (225 a 400 mg)  Pollo, 3 onzas/85 g (220 mg)  Almejas, enlatadas, 3 onzas/85 g (535 mg)  Cangrejo, 3 onzas/85 g (225 mg)  Dtiles, 5 (270 mg)  Frijoles y guisantes secos, 1/2 taza (300 a 475 mg)  Higos, secos, 2 (260 mg)  Pescado: halibut, atn, bacalao, pargo, 3 onzas/85 g (480 mg)  Pescado: salmn, abadejo, pez espada, perca, 3 onzas/85 g (300 mg)  Pescado: atn, enlatado, 3 onzas/85 g (200 mg)  Papas fritas, comida rpida, 3 onzas/85 g (470 mg)  Granola con frutas y frutos secos, 1/2 taza (200 mg)  Jugo de pomelo, 1/2 taza (200 mg)  Hojas verdes de Teacher, English as a foreign language, 1/2 taza (655 mg)  Meln dulce, 1/2 taza (200 mg)  Col rizada, 1 taza (300 mg)  Kiwi, 1 mediano (240 mg)  Colirrbano, colinabo, chiriva, 1/2 taza (280 mg)  Lentejas, 1/2 taza (365 mg)  Mango, 1 (325 mg)  Leche con chocolate, 1 taza (420 mg)  Leche: descremada, parcialmente descremada, entera, suero de [place], 1 taza (350 a 380 mg)  Melaza, 1 cucharada (295 mg)  Championes, 1/2 taza (280 mg)  Nectarina, 1 (275 mg)  Frutos secos: almendras, manes, avellanas, nueces de [place], castaas de caj, mezclados, 1 onza/28 g (200 mg)  Frutos secos: pistachos, 1 onza/28 g (295 mg)  Naranja, 1 (240 mg)  Jugo de naranja, 1/2 taza (235 mg)  Papaya, mediana, 1/2 fruta (390 mg)  [place] de man, con trozos, 2 cucharadas (240 mg)  [place] de man, sin trozos, 2 cucharadas (210 mg)  Pera, 1 mediana (200 mg)  Granada, 1 entera (400 mg)  Jugo de granada, 1/2 taza (215  mg)  Cerdo, 3 onzas/85 g (350 mg)  Papas fritas (de bolsa), 1 onza/85 g (465 mg)  Papa, asada, con piel, 1 mediana (925 mg)  Papas, hervidas, 1/2 taza (255 mg)  Papas, pur, 1/2 taza (330 mg)  Jugo de ciruela, 1/2 taza (370 mg)  Ciruelas, 5 (305 mg)  Pudin de chocolate, 1/2 taza (230 mg)  [place], enlatada, 1/2 taza (250 mg)  Pasas de uva, sin semilla, 1/4 taza (270 mg)  Semillas de girasol o calabaza, 1 onza/28 g (240 mg)  Leche de soja, 1 taza (300 mg)  Espinaca, 1/2 taza (420 mg)  Espinaca, enlatada, 1/2 taza (370 mg)  Batata, asada, con piel, 1 mediana (450 mg)  Acelga suiza, 1/2 taza (480 mg)  Jugo de tomate o verduras, 1/2 taza (275 mg)  Salsa o pur de tomates, 1/2 taza (400 a 550 mg)  Tomate, crudo, 1 mediano (290 mg)  Tomates, enlatados, 1/2 taza (200 a 300 mg)  Pavo, 3 onzas/85 g (250 mg)  Germen de trigo, 1 onza/28 g (250 mg)  [place] de invierno, 1/2 taza (250 mg)  Yogur, comn o con frutas, 6 onzas/177 ml (260 a 435 mg)  [place]n, 1/2 taza (220 mg) CON CONTENIDO MODERADO DE POTASIO Los siguientes alimentos y bebidas  tienen entre 50 y 200 mg de potasio por porcin:  Manzana, 1 (150 mg)  Jugo de [place], 1/2 taza (150 mg)  Pur de Archivist, 1/2 taza (90 mg)  Nctar de damasco, 1/2 taza (140 mg)  Esprragos, pequeos, 1/2 taza o 6 esprragos (155 mg)  Bagel con canela y pasas de uva, 1 (130 mg)  Bagel con huevo o comn, 4 pulgada/10 cm, 1 (70 mg)  Frijoles verdes, 1/2 taza (90 mg)  Frijoles amarillos, 1/2 taza (190 mg)  Cerveza, regular, 12 onzas/355 ml (100 mg)  Remolachas, enlatadas, 1/2 taza (125 mg)  Moras, 1/2 taza (115 mg)  Arndanos, 1/2 taza (60 mg)  Pan integral, 1 rebanada (70 mg)  Brcoli, crudo, 1/2 taza (145 mg)  Repollo, 1/2 taza (150 mg)  Zanahorias, crudas o cocidas, 1/2 taza (180 mg)  Coliflor, cruda, 1/2 taza (150 mg)  Apio, crudo, 1/2 taza (155 mg)  Cereales, copos de salvado, 1/2 taza (120 a  150 mg)  Requesn, 1/2 taza (110 mg)  Cerezas, 10 (150 mg)  Chocolate, barra de 1 1/2 onza/43 g (165 mg)  Caf, de cafetera, 6 onzas/177 ml (90 mg)  Maz, 1/2 taza o 1 espiga (195 mg)  Pepinos, 1/2 taza (80 mg)  Huevo, grande, 1 (60 mg)  Berenjena, 1/2 taza (60 mg)  Endivia, cruda, 1/2 taza (80 mg)  Muffin ingls, 1 (65 mg)  Pescado: reloj anaranjado, 3 onzas/85 g (150 mg)  Salchicha de [place], de vaca o cerdo, 1 (75 mg)  Cctel de frutas, 1/2 taza (115 mg)  Jugo de uvas, 1/2 taza (170 mg)  Pomelo, 1/2 (175 mg)  Uvas, 1/2 taza (155 mg)  Hojas verdes: col rizada, nabo, col, 1/2 taza (110 a 150 mg)  Helado o yogur helado, de chocolate, 1/2 taza (175 mg)  Helado o yogur helado, de vainilla, 1/2 taza (120 a 150 mg)  Limones, limas, 1 (80 mg)  Lechuga, todos los tipos, 1 taza (100 mg)  Delphi, 1/2 taza (150 mg)  Championes, crudos, 1/2 taza (110 mg)  Frutos secos: nueces, pacanas o macadamia, 1 onza/28 g (134m)  Avena, 1/2 taza (80 mg)  [place], 1/2 taza (110 mg)  [place]olla, cruda, 1/2 taza (120 mg)  Durazno, 1 (185 mg)  Duraznos, enlatados, 1/2 taza (120 mg)  Peras, enlatadas, 1/2 taza (120 mg)  Guisantes, congelados, 1/2 taza (90 mg)  Pimientos, verdes, 1/2 taza (130 mg)  Pimientos, rojos, 1/2 taza (160 mg)  Jugo de pia, 1/2 taza (165 mg)  Pia, fresca o enlatada, 1/2 taza (100 mg)  Ciruelas, 1 (105 mg)  Pudin de vainilla, 1/2 taza (150 mg)  Frambuesas, 1/2 taza (90 mg)  Ruibardo, 1/2 taza (115 mg)  Arroz salvaje, 1/2 taza (80 mg)  Camarones, 3 onzas/85 g (155 mg)  Espinaca, cruda, 1 taza (170 mg)  Fresas, 1/2 taza (125 mg)  Calabaza de verano, 1/2 taza (175 a 200 mg)  Acelga suiza, cruda, 1 taza (135 mg)  Mandarina, 1 (140 mg)  T, 6 onzas/177 ml (65 mg)  Nabos, 1/2 taza (140 mg)  Sanda, 1/2 taza (85 mg)  Vino tinto, de mesa, 5 onzas/148 ml (180 mg)  Vino blanco, de mesa, 5 onzas/148 ml (100  mg) CON BAJO CONTENIDO DE POTASIO Los siguientes alimentos y bebidas tienen menos de 50 mg de potasio por porcin:  Pan blanco, 1 rebanada (30 g)  Bebidas gaseosas, 12 onzas/355 ml (menos de 5 mg)  Queso, 1 onza/28 g (20 a 30 mg)  Arndanos  rojos, 1/2 taza (45 mg)  Cctel con jugo de arndanos rojos, 1/2 taza (20 mg)  Grasas y aceites, 1 cucharada (menos de 5 mg)  Hummus, 1 cucharada (32 mg)  Nctar: papaya, mango o pera, 1/2 taza (35 mg)  Arroz blanco o integral, 1/2 taza (50 mg)  Espaguetis o macarrones, cocidos, 1/2 taza (30 mg)  Tortilla de harina o maz, 1 (50 mg)  Waffle, 4 pulgadas/10 cm, 1 (50 mg)  Castaas de agua, 1/2 taza (40 mg)   Esta informacin no tiene Marine scientist el consejo del mdico. Asegrese de hacerle al mdico cualquier pregunta que tenga.   Document Released: 01/07/2012 Document Revised: 04/19/2013 Elsevier Interactive Patient Education [employer].

## 2016-01-28 ENCOUNTER — Other Ambulatory Visit: Payer: Self-pay | Admitting: Hematology and Oncology

## 2016-01-31 ENCOUNTER — Other Ambulatory Visit: Payer: Self-pay

## 2016-01-31 ENCOUNTER — Ambulatory Visit: Payer: Self-pay

## 2016-01-31 ENCOUNTER — Telehealth: Payer: Self-pay | Admitting: Hematology and Oncology

## 2016-01-31 NOTE — Telephone Encounter (Signed)
lvm to inform dtr of pt 10/26 appt per LOS using language line interpreter [phone]

## 2016-02-01 ENCOUNTER — Other Ambulatory Visit: Payer: Self-pay | Admitting: General Surgery

## 2016-02-05 NOTE — Pre-Procedure Instructions (Signed)
Burlene Arnt  02/05/2016     Your procedure is scheduled on : Monday February 11, 2016 at 8:00 AM.  Report to [facility] Admitting at 6:00 AM.  Call this number if you have problems the morning of surgery: [phone]    Remember:  Do not eat food or drink liquids after midnight.  Take these medicines the morning of surgery with A SIP OF WATER : Lorazepam (Ativan) if needed, Omeprazole (Prilosec)   Stop taking any vitamins, herbal medications/supplements, NSAIDs, Ibuprofen, Advil, Motrin, Aleve, etc today   Do not wear jewelry, make-up or nail polish.  Do not wear lotions, powders, perfumes, or deoderant.  Do not shave 48 hours prior to surgery.    Do not bring valuables to the hospital.  [facility] is not responsible for any belongings or valuables.  Contacts, dentures or bridgework may not be worn into surgery.  Leave your suitcase in the car.  After surgery it may be brought to your room.  For patients admitted to the hospital, discharge time will be determined by your treatment team.  Patients discharged the day of surgery will not be allowed to drive home.   Name and phone number of your driver:    Special instructions:  Shower using CHG soap the night before and the morning of your surgery  Please read over the following fact sheets that you were given.

## 2016-02-06 ENCOUNTER — Encounter (HOSPITAL_COMMUNITY): Payer: Self-pay

## 2016-02-06 ENCOUNTER — Encounter (HOSPITAL_COMMUNITY)
Admission: RE | Admit: 2016-02-06 | Discharge: 2016-02-06 | Disposition: A | Discharge status: Home / Self Care | Payer: Self-pay | Source: Ambulatory Visit | Attending: General Surgery | Admitting: General Surgery

## 2016-02-06 DIAGNOSIS — C50911 Malignant neoplasm of unspecified site of right female breast: Secondary | ICD-10-CM | POA: Insufficient documentation

## 2016-02-06 DIAGNOSIS — Z01812 Encounter for preprocedural laboratory examination: Secondary | ICD-10-CM | POA: Insufficient documentation

## 2016-02-06 DIAGNOSIS — C50912 Malignant neoplasm of unspecified site of left female breast: Secondary | ICD-10-CM | POA: Insufficient documentation

## 2016-02-06 DIAGNOSIS — Z9221 Personal history of antineoplastic chemotherapy: Secondary | ICD-10-CM | POA: Insufficient documentation

## 2016-02-06 DIAGNOSIS — E119 Type 2 diabetes mellitus without complications: Secondary | ICD-10-CM | POA: Insufficient documentation

## 2016-02-06 DIAGNOSIS — Z79899 Other long term (current) drug therapy: Secondary | ICD-10-CM | POA: Insufficient documentation

## 2016-02-06 HISTORY — DX: Gastro-esophageal reflux disease without esophagitis: K21.9

## 2016-02-06 LAB — CBC
HEMATOCRIT: 39.4 % (ref 36.0–46.0)
Hemoglobin: 13 g/dL (ref 12.0–15.0)
MCH: 29.2 pg (ref 26.0–34.0)
MCHC: 33 g/dL (ref 30.0–36.0)
MCV: 88.5 fL (ref 78.0–100.0)
Platelets: 202 10*3/uL (ref 150–400)
RBC: 4.45 MIL/uL (ref 3.87–5.11)
RDW: 12.6 % (ref 11.5–15.5)
WBC: 6 10*3/uL (ref 4.0–10.5)

## 2016-02-06 LAB — BASIC METABOLIC PANEL
Anion gap: 7 (ref 5–15)
BUN: 10 mg/dL (ref 6–20)
CALCIUM: 9.5 mg/dL (ref 8.9–10.3)
CO2: 23 mmol/L (ref 22–32)
CREATININE: 0.59 mg/dL (ref 0.44–1.00)
Chloride: 109 mmol/L (ref 101–111)
GFR calc non Af Amer: >60 mL/min (ref >60)
Glucose, Bld: 125 mg/dL — ABNORMAL HIGH (ref 65–99)
Potassium: 3 mmol/L — ABNORMAL LOW (ref 3.5–5.1)
Sodium: 139 mmol/L (ref 135–145)

## 2016-02-06 MED ORDER — CHLORHEXIDINE GLUCONATE CLOTH 2 % EX PADS: 6.0000 | MEDICATED_PAD | Freq: Once | CUTANEOUS | Status: DC

## 2016-02-06 NOTE — Progress Notes (Signed)
PCP unknown  Patient denied having any acute cardiac or pulmonary issues  Patients daughter Olin Hauser accompanied patient to PAT and served as patients interpreter. Olin Hauser asked Nurse twice if the hospital would provide a interpreter DOS. Nurse called Jacob Moores Control and instrumentation engineer) and informed her of this, and Jacob Moores stated she would take care of it.

## 2016-02-07 ENCOUNTER — Other Ambulatory Visit: Payer: Self-pay

## 2016-02-07 ENCOUNTER — Ambulatory Visit: Payer: Self-pay

## 2016-02-11 ENCOUNTER — Ambulatory Visit (HOSPITAL_COMMUNITY): Payer: Self-pay | Admitting: Anesthesiology

## 2016-02-11 ENCOUNTER — Encounter (HOSPITAL_COMMUNITY)
Admission: RE | Disposition: A | Discharge status: Home / Self Care | Payer: Self-pay | Source: Ambulatory Visit | Attending: General Surgery

## 2016-02-11 ENCOUNTER — Encounter (HOSPITAL_COMMUNITY): Payer: Self-pay | Admitting: Certified Registered Nurse Anesthetist

## 2016-02-11 ENCOUNTER — Observation Stay (HOSPITAL_COMMUNITY)
Admission: RE | Admit: 2016-02-11 | Discharge: 2016-02-12 | Disposition: A | Discharge status: Home / Self Care | Payer: Self-pay | Source: Ambulatory Visit | Attending: General Surgery | Admitting: General Surgery

## 2016-02-11 DIAGNOSIS — E119 Type 2 diabetes mellitus without complications: Secondary | ICD-10-CM | POA: Insufficient documentation

## 2016-02-11 DIAGNOSIS — N6012 Diffuse cystic mastopathy of left breast: Secondary | ICD-10-CM | POA: Insufficient documentation

## 2016-02-11 DIAGNOSIS — Z6841 Body Mass Index (BMI) 40.0 and over, adult: Secondary | ICD-10-CM | POA: Insufficient documentation

## 2016-02-11 DIAGNOSIS — C50011 Malignant neoplasm of nipple and areola, right female breast: Secondary | ICD-10-CM | POA: Diagnosis present

## 2016-02-11 DIAGNOSIS — Z170 Estrogen receptor positive status [ER+]: Secondary | ICD-10-CM | POA: Insufficient documentation

## 2016-02-11 DIAGNOSIS — C50911 Malignant neoplasm of unspecified site of right female breast: Secondary | ICD-10-CM | POA: Insufficient documentation

## 2016-02-11 DIAGNOSIS — Z9221 Personal history of antineoplastic chemotherapy: Secondary | ICD-10-CM | POA: Insufficient documentation

## 2016-02-11 DIAGNOSIS — C50912 Malignant neoplasm of unspecified site of left female breast: Principal | ICD-10-CM | POA: Insufficient documentation

## 2016-02-11 DIAGNOSIS — C773 Secondary and unspecified malignant neoplasm of axilla and upper limb lymph nodes: Secondary | ICD-10-CM | POA: Insufficient documentation

## 2016-02-11 DIAGNOSIS — K219 Gastro-esophageal reflux disease without esophagitis: Secondary | ICD-10-CM | POA: Insufficient documentation

## 2016-02-11 HISTORY — PX: MASTECTOMY MODIFIED RADICAL: SHX5962

## 2016-02-11 SURGERY — MASTECTOMY, MODIFIED RADICAL
Anesthesia: Regional | Site: Breast | Laterality: Bilateral

## 2016-02-11 MED ORDER — MIDAZOLAM HCL 2 MG/2ML IJ SOLN
INTRAMUSCULAR | Status: DC | PRN
  Administered 2016-02-11 (×2): 1 mg via INTRAVENOUS

## 2016-02-11 MED ORDER — CEFAZOLIN SODIUM-DEXTROSE 2-4 GM/100ML-% IV SOLN
2.0000 g | INTRAVENOUS | Status: AC
  Administered 2016-02-11: 2 g via INTRAVENOUS

## 2016-02-11 MED ORDER — LIDOCAINE HCL (CARDIAC) 20 MG/ML IV SOLN
INTRAVENOUS | Status: DC | PRN
  Administered 2016-02-11: 60 mg via INTRATRACHEAL

## 2016-02-11 MED ORDER — GLYCOPYRROLATE 0.2 MG/ML IJ SOLN
INTRAMUSCULAR | Status: DC | PRN
Start: 2016-02-11 — End: 2016-02-11
  Administered 2016-02-11: 0.1 mg via INTRAVENOUS

## 2016-02-11 MED ORDER — ROCURONIUM BROMIDE 100 MG/10ML IV SOLN
INTRAVENOUS | Status: DC | PRN
  Administered 2016-02-11: 50 mg via INTRAVENOUS

## 2016-02-11 MED ORDER — MORPHINE SULFATE (PF) 2 MG/ML IV SOLN: 2.0000 mg | INTRAVENOUS | Status: DC | PRN

## 2016-02-11 MED ORDER — PROCHLORPERAZINE MALEATE 10 MG PO TABS
10.0000 mg | ORAL_TABLET | Freq: Four times a day (QID) | ORAL | Status: DC | PRN
  Filled 2016-02-11: qty 1

## 2016-02-11 MED ORDER — 0.9 % SODIUM CHLORIDE (POUR BTL) OPTIME
TOPICAL | Status: DC | PRN
  Administered 2016-02-11: 1000 mL

## 2016-02-11 MED ORDER — HYDROMORPHONE HCL 1 MG/ML IJ SOLN
0.2500 mg | INTRAMUSCULAR | Status: DC | PRN
  Administered 2016-02-11: 0.5 mg via INTRAVENOUS

## 2016-02-11 MED ORDER — SIMETHICONE 80 MG PO CHEW: 40.0000 mg | CHEWABLE_TABLET | Freq: Four times a day (QID) | ORAL | Status: DC | PRN

## 2016-02-11 MED ORDER — GABAPENTIN 300 MG PO CAPS
ORAL_CAPSULE | ORAL | Status: AC
Start: 2016-02-11 — End: 2016-02-11
  Filled 2016-02-11: qty 1

## 2016-02-11 MED ORDER — PROMETHAZINE HCL 25 MG/ML IJ SOLN
6.2500 mg | INTRAMUSCULAR | Status: DC | PRN
  Administered 2016-02-11: 6.25 mg via INTRAVENOUS

## 2016-02-11 MED ORDER — MIDAZOLAM HCL 2 MG/2ML IJ SOLN
INTRAMUSCULAR | Status: AC
  Filled 2016-02-11: qty 2

## 2016-02-11 MED ORDER — SUGAMMADEX SODIUM 200 MG/2ML IV SOLN
INTRAVENOUS | Status: DC | PRN
  Administered 2016-02-11: 200 mg via INTRAVENOUS

## 2016-02-11 MED ORDER — ARTIFICIAL TEARS OP OINT
TOPICAL_OINTMENT | OPHTHALMIC | Status: AC
  Filled 2016-02-11: qty 3.5

## 2016-02-11 MED ORDER — GABAPENTIN 300 MG PO CAPS
300.0000 mg | ORAL_CAPSULE | ORAL | Status: AC
  Administered 2016-02-11: 300 mg via ORAL

## 2016-02-11 MED ORDER — MIDAZOLAM HCL 2 MG/2ML IJ SOLN
INTRAMUSCULAR | Status: AC
Start: 2016-02-11 — End: 2016-02-11
  Administered 2016-02-11: 2 mg
  Filled 2016-02-11: qty 2

## 2016-02-11 MED ORDER — ONDANSETRON HCL 4 MG/2ML IJ SOLN
INTRAMUSCULAR | Status: DC | PRN
  Administered 2016-02-11: 4 mg via INTRAVENOUS

## 2016-02-11 MED ORDER — ARTIFICIAL TEARS OP OINT
TOPICAL_OINTMENT | OPHTHALMIC | Status: DC | PRN
  Administered 2016-02-11: 1 via OPHTHALMIC

## 2016-02-11 MED ORDER — LIDOCAINE 2% (20 MG/ML) 5 ML SYRINGE
INTRAMUSCULAR | Status: AC
  Filled 2016-02-11: qty 5

## 2016-02-11 MED ORDER — HYDROMORPHONE HCL 1 MG/ML IJ SOLN
INTRAMUSCULAR | Status: AC
  Filled 2016-02-11: qty 1

## 2016-02-11 MED ORDER — METOPROLOL TARTRATE 5 MG/5ML IV SOLN
INTRAVENOUS | Status: AC
  Filled 2016-02-11: qty 5

## 2016-02-11 MED ORDER — PHENYLEPHRINE HCL 10 MG/ML IJ SOLN
INTRAMUSCULAR | Status: DC | PRN
  Administered 2016-02-11: 50 ug/min via INTRAVENOUS

## 2016-02-11 MED ORDER — FENTANYL CITRATE (PF) 100 MCG/2ML IJ SOLN
100.0000 ug | Freq: Once | INTRAMUSCULAR | Status: AC
  Administered 2016-02-11: 100 ug via INTRAVENOUS

## 2016-02-11 MED ORDER — PANTOPRAZOLE SODIUM 40 MG PO TBEC
40.0000 mg | DELAYED_RELEASE_TABLET | Freq: Every day | ORAL | Status: DC
Start: 2016-02-11 — End: 2016-02-12
  Administered 2016-02-11: 40 mg via ORAL
  Filled 2016-02-11 (×2): qty 1

## 2016-02-11 MED ORDER — ROCURONIUM BROMIDE 10 MG/ML (PF) SYRINGE
PREFILLED_SYRINGE | INTRAVENOUS | Status: AC
  Filled 2016-02-11: qty 10

## 2016-02-11 MED ORDER — FENTANYL CITRATE (PF) 100 MCG/2ML IJ SOLN
INTRAMUSCULAR | Status: AC
  Filled 2016-02-11: qty 2

## 2016-02-11 MED ORDER — PROPOFOL 10 MG/ML IV BOLUS
INTRAVENOUS | Status: AC
  Filled 2016-02-11: qty 20

## 2016-02-11 MED ORDER — PHENYLEPHRINE HCL 10 MG/ML IJ SOLN
INTRAMUSCULAR | Status: DC | PRN
  Administered 2016-02-11: 160 ug via INTRAVENOUS
  Administered 2016-02-11 (×2): 120 ug via INTRAVENOUS

## 2016-02-11 MED ORDER — LORAZEPAM 0.5 MG PO TABS: 0.5000 mg | ORAL_TABLET | Freq: Four times a day (QID) | ORAL | Status: DC | PRN

## 2016-02-11 MED ORDER — ONDANSETRON 4 MG PO TBDP: 4.0000 mg | ORAL_TABLET | Freq: Four times a day (QID) | ORAL | Status: DC | PRN

## 2016-02-11 MED ORDER — BUPIVACAINE-EPINEPHRINE (PF) 0.5% -1:200000 IJ SOLN
INTRAMUSCULAR | Status: DC | PRN
  Administered 2016-02-11 (×2): 20 mL

## 2016-02-11 MED ORDER — PROMETHAZINE HCL 25 MG/ML IJ SOLN
INTRAMUSCULAR | Status: AC
  Filled 2016-02-11: qty 1

## 2016-02-11 MED ORDER — PROPOFOL 500 MG/50ML IV EMUL
INTRAVENOUS | Status: DC | PRN
  Administered 2016-02-11: 25 ug/kg/min via INTRAVENOUS

## 2016-02-11 MED ORDER — CEFAZOLIN SODIUM-DEXTROSE 2-4 GM/100ML-% IV SOLN
INTRAVENOUS | Status: AC
  Filled 2016-02-11: qty 100

## 2016-02-11 MED ORDER — GABAPENTIN 100 MG PO CAPS
100.0000 mg | ORAL_CAPSULE | Freq: Every day | ORAL | Status: DC
  Administered 2016-02-11: 100 mg via ORAL
  Filled 2016-02-11: qty 1

## 2016-02-11 MED ORDER — ONDANSETRON HCL 4 MG/2ML IJ SOLN: 4.0000 mg | Freq: Four times a day (QID) | INTRAMUSCULAR | Status: DC | PRN

## 2016-02-11 MED ORDER — HYDROCODONE-ACETAMINOPHEN 5-325 MG PO TABS
1.0000 | ORAL_TABLET | ORAL | Status: DC | PRN
  Administered 2016-02-11: 1 via ORAL
  Filled 2016-02-11: qty 1

## 2016-02-11 MED ORDER — GLYCOPYRROLATE 0.2 MG/ML IV SOSY
PREFILLED_SYRINGE | INTRAVENOUS | Status: AC
  Filled 2016-02-11: qty 3

## 2016-02-11 MED ORDER — ACETAMINOPHEN 500 MG PO TABS
1000.0000 mg | ORAL_TABLET | ORAL | Status: AC
  Administered 2016-02-11: 1000 mg via ORAL

## 2016-02-11 MED ORDER — PROPOFOL 10 MG/ML IV BOLUS
INTRAVENOUS | Status: DC | PRN
  Administered 2016-02-11: 130 mg via INTRAVENOUS

## 2016-02-11 MED ORDER — FENTANYL CITRATE (PF) 100 MCG/2ML IJ SOLN
INTRAMUSCULAR | Status: DC | PRN
  Administered 2016-02-11: 100 ug via INTRAVENOUS
  Administered 2016-02-11: 50 ug via INTRAVENOUS

## 2016-02-11 MED ORDER — ONDANSETRON HCL 4 MG/2ML IJ SOLN
INTRAMUSCULAR | Status: AC
  Filled 2016-02-11: qty 2

## 2016-02-11 MED ORDER — MIDAZOLAM HCL 5 MG/ML IJ SOLN: 2.0000 mg | Freq: Once | INTRAMUSCULAR | Status: DC

## 2016-02-11 MED ORDER — METHYLENE BLUE 0.5 % INJ SOLN
INTRAVENOUS | Status: AC
  Filled 2016-02-11: qty 10

## 2016-02-11 MED ORDER — ACETAMINOPHEN 650 MG RE SUPP: 650.0000 mg | Freq: Four times a day (QID) | RECTAL | Status: DC | PRN

## 2016-02-11 MED ORDER — DEXAMETHASONE SODIUM PHOSPHATE 10 MG/ML IJ SOLN
INTRAMUSCULAR | Status: AC
  Filled 2016-02-11: qty 1

## 2016-02-11 MED ORDER — LACTATED RINGERS IV SOLN
INTRAVENOUS | Status: DC
  Administered 2016-02-11 (×3): via INTRAVENOUS

## 2016-02-11 MED ORDER — SODIUM CHLORIDE 0.9 % IV SOLN
INTRAVENOUS | Status: DC
  Administered 2016-02-11: 16:00:00 via INTRAVENOUS
  Administered 2016-02-12: 1 mL via INTRAVENOUS

## 2016-02-11 MED ORDER — DEXAMETHASONE SODIUM PHOSPHATE 10 MG/ML IJ SOLN
INTRAMUSCULAR | Status: DC | PRN
  Administered 2016-02-11: 10 mg via INTRAVENOUS

## 2016-02-11 MED ORDER — POTASSIUM CHLORIDE CRYS ER 20 MEQ PO TBCR
20.0000 meq | EXTENDED_RELEASE_TABLET | Freq: Every day | ORAL | Status: DC
  Administered 2016-02-12: 20 meq via ORAL
  Filled 2016-02-11: qty 1

## 2016-02-11 MED ORDER — ACETAMINOPHEN 500 MG PO TABS
ORAL_TABLET | ORAL | Status: AC
  Filled 2016-02-11: qty 2

## 2016-02-11 MED ORDER — PHENYLEPHRINE 40 MCG/ML (10ML) SYRINGE FOR IV PUSH (FOR BLOOD PRESSURE SUPPORT)
PREFILLED_SYRINGE | INTRAVENOUS | Status: AC
  Filled 2016-02-11: qty 10

## 2016-02-11 MED ORDER — ACETAMINOPHEN 325 MG PO TABS: 650.0000 mg | ORAL_TABLET | Freq: Four times a day (QID) | ORAL | Status: DC | PRN

## 2016-02-11 MED ORDER — METOPROLOL TARTARATE 1 MG/ML SYRINGE (5ML)
Status: DC | PRN
  Administered 2016-02-11 (×2): 1 mg via INTRAVENOUS

## 2016-02-11 MED ORDER — METHOCARBAMOL 500 MG PO TABS
500.0000 mg | ORAL_TABLET | Freq: Four times a day (QID) | ORAL | Status: DC | PRN
  Administered 2016-02-11: 500 mg via ORAL
  Filled 2016-02-11: qty 1

## 2016-02-11 SURGICAL SUPPLY — 52 items
APPLIER CLIP 9.375 MED OPEN (MISCELLANEOUS) ×6
BINDER BREAST XLRG (GAUZE/BANDAGES/DRESSINGS) ×3 IMPLANT
BIOPATCH RED 1 DISK 7.0 (GAUZE/BANDAGES/DRESSINGS) ×8 IMPLANT
BIOPATCH RED 1IN DISK 7.0MM (GAUZE/BANDAGES/DRESSINGS) ×4
CANISTER SUCTION 2500CC (MISCELLANEOUS) ×3 IMPLANT
CHLORAPREP W/TINT 26ML (MISCELLANEOUS) ×6 IMPLANT
CLIP APPLIE 9.375 MED OPEN (MISCELLANEOUS) ×2 IMPLANT
CLOSURE WOUND 1/2 X4 (GAUZE/BANDAGES/DRESSINGS) ×2
CONT SPEC 4OZ CLIKSEAL STRL BL (MISCELLANEOUS) ×3 IMPLANT
COVER SURGICAL LIGHT HANDLE (MISCELLANEOUS) ×3 IMPLANT
DERMABOND ADVANCED (GAUZE/BANDAGES/DRESSINGS) ×8
DERMABOND ADVANCED .7 DNX12 (GAUZE/BANDAGES/DRESSINGS) ×4 IMPLANT
DRAIN CHANNEL 19F RND (DRAIN) ×12 IMPLANT
DRAPE LAPAROSCOPIC ABDOMINAL (DRAPES) ×3 IMPLANT
DRAPE PROXIMA HALF (DRAPES) ×3 IMPLANT
DRSG PAD ABDOMINAL 8X10 ST (GAUZE/BANDAGES/DRESSINGS) ×12 IMPLANT
DRSG TEGADERM 4X4.75 (GAUZE/BANDAGES/DRESSINGS) ×6 IMPLANT
ELECT BLADE 4.0 EZ CLEAN MEGAD (MISCELLANEOUS) ×3
ELECT REM PT RETURN 9FT ADLT (ELECTROSURGICAL) ×3
ELECTRODE BLDE 4.0 EZ CLN MEGD (MISCELLANEOUS) ×1 IMPLANT
ELECTRODE REM PT RTRN 9FT ADLT (ELECTROSURGICAL) ×1 IMPLANT
EVACUATOR SILICONE 100CC (DRAIN) ×12 IMPLANT
GLOVE BIO SURGEON STRL SZ7 (GLOVE) ×3 IMPLANT
GLOVE BIO SURGEON STRL SZ7.5 (GLOVE) ×12 IMPLANT
GLOVE BIOGEL PI IND STRL 7.5 (GLOVE) ×2 IMPLANT
GLOVE BIOGEL PI INDICATOR 7.5 (GLOVE) ×4
GLOVE INDICATOR 7.5 STRL GRN (GLOVE) ×3 IMPLANT
GOWN STRL REUS W/ TWL LRG LVL3 (GOWN DISPOSABLE) ×3 IMPLANT
GOWN STRL REUS W/TWL LRG LVL3 (GOWN DISPOSABLE) ×6
KIT BASIN OR (CUSTOM PROCEDURE TRAY) ×3 IMPLANT
KIT ROOM TURNOVER OR (KITS) ×3 IMPLANT
MARKER SKIN DUAL TIP RULER LAB (MISCELLANEOUS) ×3 IMPLANT
NS IRRIG 1000ML POUR BTL (IV SOLUTION) ×3 IMPLANT
PACK GENERAL/GYN (CUSTOM PROCEDURE TRAY) ×3 IMPLANT
PAD ARMBOARD 7.5X6 YLW CONV (MISCELLANEOUS) ×6 IMPLANT
SPECIMEN JAR X LARGE (MISCELLANEOUS) ×6 IMPLANT
SPONGE LAP 18X18 X RAY DECT (DISPOSABLE) ×6 IMPLANT
STAPLER VISISTAT 35W (STAPLE) ×3 IMPLANT
STRIP CLOSURE SKIN 1/2X4 (GAUZE/BANDAGES/DRESSINGS) ×4 IMPLANT
SUT ETHILON 2 0 FS 18 (SUTURE) ×12 IMPLANT
SUT MNCRL AB 4-0 PS2 18 (SUTURE) ×12 IMPLANT
SUT MON AB 4-0 PC3 18 (SUTURE) ×3 IMPLANT
SUT PROLENE 2 0 CT 1 (SUTURE) ×6 IMPLANT
SUT PROLENE 2 0 SH 30 (SUTURE) ×6 IMPLANT
SUT SILK 2 0 FS (SUTURE) ×6 IMPLANT
SUT VIC AB 3-0 54X BRD REEL (SUTURE) ×1 IMPLANT
SUT VIC AB 3-0 BRD 54 (SUTURE) ×2
SUT VIC AB 3-0 SH 27 (SUTURE) ×2
SUT VIC AB 3-0 SH 27XBRD (SUTURE) ×1 IMPLANT
SUT VIC AB 3-0 SH 8-18 (SUTURE) ×3 IMPLANT
TOWEL OR 17X26 10 PK STRL BLUE (TOWEL DISPOSABLE) ×3 IMPLANT
TRAY FOLEY W/METER SILVER 14FR (SET/KITS/TRAYS/PACK) ×3 IMPLANT

## 2016-02-11 NOTE — Discharge Instructions (Signed)
[facility] °[phone] ° °MASTECTOMY: POST OP INSTRUCTIONS ° °Always review your discharge instruction sheet given to you by the facility where your surgery was performed. °IF YOU HAVE DISABILITY OR FAMILY LEAVE FORMS, YOU MUST BRING THEM TO THE OFFICE FOR PROCESSING.   °DO NOT GIVE THEM TO YOUR DOCTOR. °A prescription for pain medication may be given to you upon discharge.  Take your pain medication as prescribed, if needed.  If narcotic pain medicine is not needed, then you may take acetaminophen (Tylenol), naprosyn (Alleve) or ibuprofen (Advil) as needed. °1. Take your usually prescribed medications unless otherwise directed. °2. If you need a refill on your pain medication, please contact your pharmacy.  They will contact our office to request authorization.  Prescriptions will not be filled after 5pm or on week-ends. °3. You should follow a light diet the first few days after arrival home, such as soup and crackers, etc.  Resume your normal diet the day after surgery. °4. Most patients will experience some swelling and bruising on the chest and underarm.  Ice packs will help.  Swelling and bruising can take several days to resolve. Wear the binder day and night until you return to the office.  °5. It is common to experience some constipation if taking pain medication after surgery.  Increasing fluid intake and taking a stool softener (such as Colace) will usually help or prevent this problem from occurring.  A mild laxative (Milk of Magnesia or Miralax) should be taken according to package instructions if there are no bowel movements after 48 hours. °6. Unless discharge instructions indicate otherwise, leave your bandage dry and in place until your next appointment in 3-5 days.  You may take a limited sponge bath.  No tube baths or showers until the drains are removed.  You may have steri-strips (small skin tapes) in place directly over the incision.  These strips should be left on the  skin for 7-10 days. If you have glue it will come off in next couple week.  Any sutures will be removed at an office visit °7. DRAINS:  If you have drains in place, it is important to keep a list of the amount of drainage produced each day in your drains.  Before leaving the hospital, you should be instructed on drain care.  Call our office if you have any questions about your drains. I will remove your drains when they put out less than 30 cc or ml for 2 consecutive days. °8. ACTIVITIES:  You may resume regular (light) daily activities beginning the next day--such as daily self-care, walking, climbing stairs--gradually increasing activities as tolerated.  You may have sexual intercourse when it is comfortable.  Refrain from any heavy lifting or straining until approved by your doctor. °a. You may drive when you are no longer taking prescription pain medication, you can comfortably wear a seatbelt, and you can safely maneuver your car and apply brakes. °b. RETURN TO WORK:  __________________________________________________________ °9. You should see your doctor in the office for a follow-up appointment approximately 3-5 days after your surgery.  Your doctor’s nurse will typically make your follow-up appointment when she calls you with your pathology report.  Expect your pathology report 3-4business days after surgery. °10. OTHER INSTRUCTIONS: ______________________________________________________________________________________________ ____________________________________________________________________________________________ °WHEN TO CALL YOUR DR Saliah Crisp: °1. Fever over 101.0 °2. Nausea and/or vomiting °3. Extreme swelling or bruising °4. Continued bleeding from incision. °5. Increased pain, redness, or drainage from the incision. °The clinic staff is available   to answer your questions during regular business hours.  Please don’t hesitate to call and ask to speak to one of the nurses for clinical concerns.  If  you have a medical emergency, go to the nearest emergency room or call 911.  A surgeon from [facility] is always on call at the hospital. °[address], [address], [place], [place]  [address] ? [address] [address], [place], [place]   [address] °[phone] ? [phone] ? FAX [phone] °Web site: [email or web address] ° °

## 2016-02-11 NOTE — Transfer of Care (Signed)
Immediate Anesthesia Transfer of Care Note  Patient: Felicia [name]  Procedure(s) Performed: Procedure(s): BILATERAL MASTECTOMY MODIFIED RADICAL (Bilateral)  Patient Location: PACU  Anesthesia Type:General and Regional  Level of Consciousness: awake, alert  and patient cooperative  Airway & Oxygen Therapy: Patient Spontanous Breathing and Patient connected to face mask oxygen  Post-op Assessment: Report given to RN, Post -op Vital signs reviewed and stable, Patient moving all extremities X 4 and Patient able to stick tongue midline  Post vital signs: Reviewed and stable  Last Vitals:  Vitals:   02/11/16 0725  BP: (!) 161/79  Pulse: 86  Resp: 18  Temp: 36.5 C    Last Pain:  Vitals:   02/11/16 0725  TempSrc: Oral         Complications: No apparent anesthesia complications

## 2016-02-11 NOTE — Anesthesia Procedure Notes (Signed)
Procedure Name: Intubation Date/Time: 02/11/2016 9:04 AM Performed by: Roderic Palau Pre-anesthesia Checklist: Patient identified, Emergency Drugs available, Suction available and Patient being monitored Patient Re-evaluated:Patient Re-evaluated prior to inductionOxygen Delivery Method: Circle system utilized Preoxygenation: Pre-oxygenation with 100% oxygen Intubation Type: IV induction Ventilation: Mask ventilation without difficulty Laryngoscope Size: Mac and 4 Grade View: Grade I Tube type: Oral Number of attempts: 1 Airway Equipment and Method: Stylet Placement Confirmation: ETT inserted through vocal cords under direct vision,  positive ETCO2 and breath sounds checked- equal and bilateral Secured at: 22 cm Dental Injury: Teeth and Oropharynx as per pre-operative assessment

## 2016-02-11 NOTE — Op Note (Signed)
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 Right breast and axillary contents marked short superior, long axillary contents 2. Left breast and axillary contents marked short superior, long axillary contents 3. Additional left axillary contents Sponge count correct at completion Dispo to recovery stable  Indications: This is a 31 yof who presented with locally advanced bilateral breast cancers. She has undergone primary chemotherapy with modest response.  She still has palpable and radiological enlarged nodes. We discussed options and have elected to proceed with bilateral mrm.  Procedure: After informed consent was obtained patient was taken to the OR. She was given abx.  She had SCDs in place.  She underwent bilateral pectoral blocks with Dr Ola Spurr.  She was then placed under general anesthesia without complication.  She was prepped and draped in the standard sterile surgical fashion. A surgical timeout was then performed.  I did the right mastectomy first. I made a large elliptical incision to include NAC.  I then created flaps to the clavicle, IM fold, parasternal area and the latissimus laterally.  I lifted the port up to do this and then resutured it into place with 2-0 prolene suture. I then removed the breast and the fascia from the pectoralis muscle.   I then entered into the axilla.  I was able to identify the axillary vein, thoracodorsal bundle and the long thoracic nerve.  I swept the nodal tissue caudad off the vein taking care to avoid the structures above.  There were still several enlarged nodes that were adherent to the tissue around the vein.  I then removed the axillary contents with the  breast and passed this off the table as a specimen.  Hemostasis was obtained. I placed 2 19 Fr blake drains and secured these with 2-0 nylon suture.  This was closed with 3-0 vicryl and 4-0 monocryl.  Glue and steristrips were applied.  biopatches were placed over the drains.   I then went to the left side. I made a similar incision and created flaps in the same manner. I then removed the breast and fascia from the pectoralis muscle.  I then entered into the axilla where there were several enlarged nodes. These were very adherent to the vein. I was able to identify the thoracodorsal bundle, long thoracic vein and the axillary vein.  These were preserved.  I removed this all together but did have a little more axillary contents that was sent separately. Hemostasis was observed.  I placed two additional 19 Fr Blake drains secured with 2-0 nylon suture. I closed this side in v-y fashion laterally with 3-0 vicryl and 4-0 monocryl Glue and steristrips were applied.  biopatches were placed over the drains. A binder was placed. She tolerated this well, was extubated and transferred to recovery stable.

## 2016-02-11 NOTE — Anesthesia Preprocedure Evaluation (Addendum)
Anesthesia Evaluation  Patient identified by MRN, date of birth, ID band Patient awake    Reviewed: Allergy & Precautions, H&P , NPO status , Patient's Chart, lab work & pertinent test results  Airway Mallampati: II  TM Distance: >3 FB Neck ROM: Full    Dental no notable dental hx. (+) Teeth Intact, Chipped,    Pulmonary neg pulmonary ROS,    Pulmonary exam normal breath sounds clear to auscultation       Cardiovascular negative cardio ROS   Rhythm:Regular Rate:Normal     Neuro/Psych negative neurological ROS  negative psych ROS   GI/Hepatic Neg liver ROS, GERD  Medicated and Controlled,  Endo/Other  diabetesMorbid obesity  Renal/GU negative Renal ROS  negative genitourinary   Musculoskeletal   Abdominal   Peds  Hematology negative hematology ROS (+)   Anesthesia Other Findings   Reproductive/Obstetrics negative OB ROS                            Anesthesia Physical Anesthesia Plan  ASA: II  Anesthesia Plan: General and Regional   Post-op Pain Management: GA combined w/ Regional for post-op pain   Induction: Intravenous  Airway Management Planned: Oral ETT  Additional Equipment:   Intra-op Plan:   Post-operative Plan: Extubation in OR  Informed Consent: I have reviewed the patients History and Physical, chart, labs and discussed the procedure including the risks, benefits and alternatives for the proposed anesthesia with the patient or authorized representative who has indicated his/her understanding and acceptance.   Dental advisory given  Plan Discussed with: CRNA  Anesthesia Plan Comments:         Anesthesia Quick Evaluation

## 2016-02-11 NOTE — Anesthesia Procedure Notes (Addendum)
Anesthesia Regional Block:  Pectoralis block  Pre-Anesthetic Checklist: ,, timeout performed, Correct Patient, Correct Site, Correct Laterality, Correct Procedure, Correct Position, site marked, Risks and benefits discussed, pre-op evaluation,  At surgeon's request and post-op pain management  Laterality: Left and Right  Prep: Maximum Sterile Barrier Precautions used, chloraprep       Needles:  Injection technique: Single-shot  Needle Type: Echogenic Stimulator Needle     Needle Length: 9cm 9 cm Needle Gauge: 21 and 21 G    Additional Needles:  Procedures: ultrasound guided (picture in chart) Pectoralis block Narrative:  Start time: 02/11/2016 8:20 AM End time: 02/11/2016 8:30 AM Injection made incrementally with aspirations every 5 mL. Anesthesiologist: Roderic Palau  Additional Notes: 2% Lidocaine skin wheel. Bilateral PECS block.

## 2016-02-11 NOTE — Anesthesia Postprocedure Evaluation (Signed)
Anesthesia Post Note  Patient: Felicia [name]  Procedure(s) Performed: Procedure(s) (LRB): BILATERAL MASTECTOMY MODIFIED RADICAL (Bilateral)  Patient location during evaluation: PACU Anesthesia Type: General and Regional Level of consciousness: awake and alert Pain management: pain level controlled Vital Signs Assessment: post-procedure vital signs reviewed and stable Respiratory status: spontaneous breathing, nonlabored ventilation, respiratory function stable and patient connected to nasal cannula oxygen Cardiovascular status: blood pressure returned to baseline and stable Postop Assessment: no signs of nausea or vomiting Anesthetic complications: no    Last Vitals:  Vitals:   02/11/16 1345 02/11/16 1425  BP: 136/86 134/82  Pulse: 80 82  Resp: 17 18  Temp:  36.7 C    Last Pain:  Vitals:   02/11/16 1425  TempSrc: Oral                 Kenyada Hy,W. EDMOND

## 2016-02-11 NOTE — H&P (Signed)
Felicia [name] is an 55 y.o. female.   Chief Complaint: bilateral breast cancer HPI: 1 yof with no prior history and no family history of breast or ovarian cancer here for follow up after chemotherapy. She is from [place]. she is here with her daughter today who is translating again. they did not want to do translator service. she had breast pain at diagnosis and underwent mm that shows density b breasts. there was mass with spiculated margin and calcs at 12 oclock in the right breast. there is irregular mass in right breast laterally with mulitple nodules seen in right liq. there is mass at 12 oclock left breast, oval mass in liq of left breast and in left uoq. she had US guided biopsy of two right breast lesions, two left breast lesions and bilateral nodes. hydromark clips are in the nodes. the results are as follows: 1. left breast 8 oclock- idc with dcis,grade 2, 100/95/-/30% 2. left breast 12 oclock- idc dissimilar to #1, grade 1, 95/95/-/10% 3. left axillary node positive 4. right 1 oclock- idc, grade 2, 95/2/negative/15% 5. right 10 oclock- idc, grade 2-3, 95/0/positive/25% 6. right ax node- positive, her 2 negative she is here without any complaints today she has also undergone a mri that shows right breast with uiq that is spiculated and measures 2.5x4.2 cm, there are spicules of enhancment from this base to the right nipple with right nipple enhancement. there is also right breast mass within ruoq 1.6x1.7cm. there is also an additional lesion measuring 1.9x1.2 cm that has not been biopsied. there are mulitple small enhancing nodules. In the left breast in uiq there is spiculated enhancing mass measuring 4.2x6.6 cm. there is nipple enhancement and retraction. there are innumerable other masses present in this braest. there are also enlarged bilateral axillary nodes. ct shows bilateral breast masses, bilateral ax lad and bilateral ll pulm nodules measuring up to 7 mm which  are indeterminate.  she has done well with chemotherapy. her latest mri shows that there is good response. the right breast now shows a 2.7x 2 cm area with multiple other nodules resolved. the left breast shows marked decrease to 3.8 cm from 6.6 cm. there is still a 0.8 cm right axillary node and a 1.6 cm left axillary node. others have mildly decreased in size. she is here today to discus definitive surgery     Past Medical History:  Diagnosis Date  . Cancer ([place])    breast  . Diabetes mellitus without complication ([place])    no medications  . GERD (gastroesophageal reflux disease)     Past Surgical History:  Procedure Laterality Date  . PORTACATH PLACEMENT N/A 09/04/2015   Procedure: INSERTION PORT-A-CATH WITH [place];  Surgeon: Rolm Bookbinder, MD;  Location: [place];  Service: General;  Laterality: N/A;  . TUBAL LIGATION      Family History  Problem Relation Age of Onset  . Diabetes Mother    Social History:  reports that she has never smoked. She has never used smokeless tobacco. She reports that she does not drink alcohol or use drugs.  Allergies:  Allergies  Allergen Reactions  . No Known Allergies     Medications Prior to Admission  Medication Sig Dispense Refill  . gabapentin (NEURONTIN) 100 MG capsule Take 1 capsule (100mg ) by mouth three times per day for 1 week.  Increase to 2 capsules (200mg ) by mouth three times per day for 1 week. Then increase to 3 capsules (300mg ) by mouth three times  per day. (Patient taking differently: Take 100 mg by mouth at bedtime. ) 130 capsule 0  . lidocaine-prilocaine (EMLA) cream Apply 1 application topically as needed (Apply 2- hours prior to access of port).    Marland Kitchen omeprazole (PRILOSEC) 20 MG capsule Take 1 capsule (20 mg total) by mouth daily. 30 capsule 0  . potassium chloride SA (K-DUR,KLOR-CON) 20 MEQ tablet Take 1 tablet (20 mEq total) by mouth daily. 30 tablet 0  . prochlorperazine (COMPAZINE) 10 MG tablet  Take 10 mg by mouth every 6 (six) hours as needed for nausea or vomiting.    Marland Kitchen HYDROcodone-acetaminophen (NORCO) 10-325 MG tablet Take 1 tablet by mouth every 6 (six) hours as needed. (Patient not taking: Reported on 02/05/2016) 10 tablet 0  . LORazepam (ATIVAN) 0.5 MG tablet Take 0.5 mg by mouth every 6 (six) hours as needed for anxiety.    . magic mouthwash w/lidocaine SOLN Take 5 mLs by mouth 3 (three) times daily as needed for mouth pain. (Patient not taking: Reported on 02/05/2016) 100 mL 0  . naproxen (NAPROSYN) 500 MG tablet Take 500 mg by mouth 2 (two) times daily as needed (for pain.).       No results found for this or any previous visit (from the past 48 hour(s)). No results found.  ROS Negative   Blood pressure (!) 161/79, pulse 86, temperature 97.7 F (36.5 C), temperature source Oral, resp. rate 18, weight 98.9 kg (218 lb), SpO2 97 %. Physical Exam   Vitals (Sonya Bynum CMA; 01/25/2016 2:36 PM) 01/25/2016 2:35 PM Weight: 219 lb Height: 62in Body Surface Area: 1.99 m Body Mass Index: 40.06 kg/m  Temp.: 97.45F(Temporal)  Pulse: 76 (Regular)  BP: 124/80 (Sitting, Left Arm, Standard)  Physical Exam Rolm Bookbinder MD; 01/25/2016 3:02 PM) General Mental Status-Alert. Orientation-Oriented X3.  Chest and Lung Exam Chest and lung exam reveals -on auscultation, normal breath sounds, no adventitious sounds and normal vocal resonance.  Breast Nipples-No Discharge. Breast Lump-No Palpable Breast Mass.  Cardiovascular Cardiovascular examination reveals -normal heart sounds, regular rate and rhythm with no murmurs.  Lymphatic Note: no [place] adenopathy, some mild enlarged left axillary nodes, no cervical adenopathy   Assessment/Plan  BILATERAL BREAST CANCER (C50.911) Story: Bilateral breast cancer s/p primary chemotherapy She has had good response but not complete response in her nodes either with exam or radiologically. I still think she needs  bilateral mastectomies to clear disease in her breasts. I discussed bilateral mastectomies without reconstruction today. we discussed surgery, drains, postop course and recovery. risks also discussed. I also unfortunately think she needs bilateral alnd due to possible continued nodal involvement. I dont think she has other options here. That will place her at high risk for lymphedema as she will get recommended radiotherapy as well but I think this is needed for treatment of her cancer. we discussed longterm risk of lymphedema. I will plan on scheduling her in a couple weeks to let her recover additionally from chemotherapy  Rolm Bookbinder, MD 02/11/2016, 8:12 AM

## 2016-02-11 NOTE — Interval H&P Note (Signed)
History and Physical Interval Note:  02/11/2016 8:13 AM  Felicia [name]  has presented today for surgery, with the diagnosis of BILATERAL BREAST CANCER  The various methods of treatment have been discussed with the patient and family. After consideration of risks, benefits and other options for treatment, the patient has consented to  Procedure(s): BILATERAL MASTECTOMY MODIFIED RADICAL (Bilateral) as a surgical intervention .  The patient's history has been reviewed, patient examined, no change in status, stable for surgery.  I have reviewed the patient's chart and labs.  Questions were answered to the patient's satisfaction.     Javad Salva

## 2016-02-12 ENCOUNTER — Encounter (HOSPITAL_COMMUNITY): Payer: Self-pay | Admitting: General Surgery

## 2016-02-12 LAB — CBC
HEMATOCRIT: 33.5 % — AB (ref 36.0–46.0)
Hemoglobin: 11.1 g/dL — ABNORMAL LOW (ref 12.0–15.0)
MCH: 29.1 pg (ref 26.0–34.0)
MCHC: 33.1 g/dL (ref 30.0–36.0)
MCV: 87.9 fL (ref 78.0–100.0)
PLATELETS: 184 10*3/uL (ref 150–400)
RBC: 3.81 MIL/uL — AB (ref 3.87–5.11)
RDW: 12.6 % (ref 11.5–15.5)
WBC: 9.9 10*3/uL (ref 4.0–10.5)

## 2016-02-12 LAB — BASIC METABOLIC PANEL
Anion gap: 8 (ref 5–15)
BUN: 9 mg/dL (ref 6–20)
CALCIUM: 8.9 mg/dL (ref 8.9–10.3)
CO2: 23 mmol/L (ref 22–32)
CREATININE: 0.65 mg/dL (ref 0.44–1.00)
Chloride: 107 mmol/L (ref 101–111)
GFR calc Af Amer: >60 mL/min (ref >60)
GLUCOSE: 120 mg/dL — AB (ref 65–99)
Potassium: 3.1 mmol/L — ABNORMAL LOW (ref 3.5–5.1)
Sodium: 138 mmol/L (ref 135–145)

## 2016-02-12 MED ORDER — HYDROCODONE-ACETAMINOPHEN 5-325 MG PO TABS: 1.0000 | ORAL_TABLET | ORAL | 0 refills | Status: DC | PRN

## 2016-02-12 NOTE — Progress Notes (Signed)
Pt teach how to empty the 4 JP drain and given 1x pain meds, change the dressing on both JP sites, no active leaking noted at this time.

## 2016-02-12 NOTE — Discharge Summary (Signed)
Physician Discharge Summary  Patient ID: Felicia [name] MRN: [id number] DOB/AGE: 1960-11-30 56 y.o.  Admit date: 02/11/2016 Discharge date: 02/12/2016  Admission Diagnoses: Bilateral breast cancer s/p chemotherapy  Discharge Diagnoses:  Active Problems:   Bilateral breast cancer [facility])   Discharged Condition: good  Hospital Course: 22 yof who underwent primary chemotherapy for locally advanced bilateral breast cancer. She has had modest response to chemo.  She underwent bilateral mrm which she has done well from.  Drains are putting out as expected. Pain well controlled. Tol diet and will be discharged home today  Consults: none  Significant Diagnostic Studies:none  Treatments: bilateral mrm  Discharge Exam: Blood pressure (!) 122/50, pulse 62, temperature 98.2 F (36.8 C), temperature source Oral, resp. rate 18, weight 98.8 kg (217 lb 13 oz), SpO2 97 %. Flaps viable, no hematoma, drains as expected  Disposition: 01-Home or Self Care     Medication List    TAKE these medications   gabapentin 100 MG capsule Commonly known as:  NEURONTIN Take 1 capsule (100mg ) by mouth three times per day for 1 week.  Increase to 2 capsules (200mg ) by mouth three times per day for 1 week. Then increase to 3 capsules (300mg ) by mouth three times per day. What changed:  how much to take  how to take this  when to take this  additional instructions   HYDROcodone-acetaminophen 10-325 MG tablet Commonly known as:  NORCO Take 1 tablet by mouth every 6 (six) hours as needed. What changed:  Another medication with the same name was added. Make sure you understand how and when to take each.   HYDROcodone-acetaminophen 5-325 MG tablet Commonly known as:  NORCO/VICODIN Take 1-2 tablets by mouth every 4 (four) hours as needed for moderate pain. What changed:  You were already taking a medication with the same name, and this prescription was added. Make sure you understand how and when to  take each.   lidocaine-prilocaine cream Commonly known as:  EMLA Apply 1 application topically as needed (Apply 2- hours prior to access of port).   LORazepam 0.5 MG tablet Commonly known as:  ATIVAN Take 0.5 mg by mouth every 6 (six) hours as needed for anxiety.   magic mouthwash w/lidocaine Soln Take 5 mLs by mouth 3 (three) times daily as needed for mouth pain.   naproxen 500 MG tablet Commonly known as:  NAPROSYN Take 500 mg by mouth 2 (two) times daily as needed (for pain.).   omeprazole 20 MG capsule Commonly known as:  PRILOSEC Take 1 capsule (20 mg total) by mouth daily.   potassium chloride SA 20 MEQ tablet Commonly known as:  K-DUR,KLOR-CON Take 1 tablet (20 mEq total) by mouth daily.   prochlorperazine 10 MG tablet Commonly known as:  COMPAZINE Take 10 mg by mouth every 6 (six) hours as needed for nausea or vomiting.      Follow-up Information    Felicia Holloman, MD Follow up in 1 week(s).   Specialty:  General Surgery Contact information: [address] [place] [address] [phone]           Signed: Rolm Bookbinder 02/12/2016, 9:19 AM

## 2016-02-12 NOTE — Care Management Note (Signed)
Case Management Note  Patient Details  Name: Felicia [name] MRN: [id number] Date of Birth: 1960-11-25  Subjective/Objective:                    Action/Plan: Provided and explained [place] letter to patient and daughter . Provided [facility] and Wellness information . Daughter voiced understanding of both.   Expected Discharge Date:                  Expected Discharge Plan:  Home/Self Care  In-House Referral:     Discharge planning Services  CM Consult, Medication Assistance, [facility]  Post Acute Care Choice:    Choice offered to:  Adult Children, Patient  DME Arranged:    DME Agency:     HH Arranged:    HH Agency:     Status of Service:  Completed, signed off  If discussed at [place] of Stay Meetings, dates discussed:    Additional Comments:  [name], RN 02/12/2016, 11:06 AM

## 2016-02-12 NOTE — Progress Notes (Deleted)
Pt refused to change her bed sheet, empty her JP drain and to check her v/s because she still want to sleep.

## 2016-02-14 ENCOUNTER — Other Ambulatory Visit: Payer: Self-pay

## 2016-02-14 ENCOUNTER — Ambulatory Visit: Payer: Self-pay

## 2016-02-14 ENCOUNTER — Ambulatory Visit: Payer: Self-pay | Admitting: Hematology and Oncology

## 2016-02-20 ENCOUNTER — Other Ambulatory Visit: Payer: Self-pay | Admitting: *Deleted

## 2016-02-20 DIAGNOSIS — C50011 Malignant neoplasm of nipple and areola, right female breast: Secondary | ICD-10-CM

## 2016-02-21 ENCOUNTER — Ambulatory Visit: Payer: Self-pay

## 2016-02-21 ENCOUNTER — Ambulatory Visit: Payer: Self-pay | Admitting: Hematology and Oncology

## 2016-02-21 ENCOUNTER — Other Ambulatory Visit: Payer: Self-pay | Admitting: Hematology and Oncology

## 2016-02-21 ENCOUNTER — Other Ambulatory Visit: Payer: Self-pay

## 2016-02-21 NOTE — Assessment & Plan Note (Deleted)
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%; HER-2 negative ratio 1.04; 10:00: IDC grade 2-3 dissimilar to 1:00 biopsy 10:00: ER 95%, PR 0%, K67: 25%, HER-2 positiveratio 6.42 right axillary lymph node positive for metastatic cancer (LN is Her 2 neg) --------------------------------------------------------------------------------- Left breast LIQ 08/20/2015: 4.2 cm, 4 mm oval mass 12:00, 1.2 cm mass left UOQ, enlarged left axillary lymph node, T2 N1 stage II B clinical stage  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 -------------------------------------------------------------------------------------- Treatment summary: 1. Neoadjuvant chemotherapy with either AC followed by THP started 09/13/2015 and completed 12 weeks of Taxol 11/29/2015, Herceptin Perjeta 6 2. 02/11/2016: Right mastectomy: IDC 2 nodules, 2.5 cm and 1 cm, 3/7 lymph nodes positive, ER 20%, PR 40%, Ki-67 2%, HER-2 negative ratio 1.38, T2 N1 stage IIB;  Left mastectomy: IDC 3.1 cm, 0/8 lymph nodes negative, ER 20%, PR 40%, Ki-67 2%, HER-2 negative ratio 1.71, T2 N0 stage II a ---------------------------------------------------------------------------------------- Treatment plan: 1. Herceptin maintenance every 3 weeks 2. Adjuvant radiation therapy   3 Followed by adjuvant antiestrogen therapy.  Return to clinic every 3 weeks for Herceptin every 6 weeks for M.D. follow-up

## 2016-02-27 NOTE — Progress Notes (Signed)
Location of Breast Cancer:  Bi/Lateral  Breasts   Histology per Pathology Report:Diagnosis 08/20/2015: 1. Breast, right, needle core biopsy, 1:00 o'clock - INVASIVE MAMMARY CARICNOMA.  2. Breast, right, needle core biopsy, 10:00 o'clock - INVASIVE DUCTAL CARCINOMA.  3. Lymph node, needle/core biopsy, right - METASTATIC CARCINOMA IN 1 OF 1 LYMPH NODE (1/1).  Diagnosis 08/20/2015: 1. Breast, left, needle core biopsy, 8:00 o'clock - INVASIVE DUCTAL CARCINOMA.- DUCTAL CARCINOMA IN SITU.- SEE COMMENT. 2. Breast, left, needle core biopsy, 12:00 o'clock - INVASIVE MAMMARY CARCINOMA. - MAMMARY CARICNOMA IN SITU.  3. Lymph node, needle/core biopsy, left - METASTATIC CARCINOMA IN 1 OF 1 LYMPH NODE (1/1).   Receptor Status: Right breast  ER(95%+), PR (2%+), Her2-neu (neg), Ki-(15%)                              Left Breast ER(95%+), PR(95%+) Her2-neu(neg,) Ki-(10%)  Did patient present with symptoms (if so, please note symptoms) or was this found on screening mammography?: patient had b/l breast pain,   Past/Anticipated interventions by surgeon, if any: :  Diagnosis10/16/2017: DR. Rolm Bookbinder, MD: follow up 04/03/16 1. Breast, modified radical mastectomy , Right - INVASIVE DUCTAL CARCINOMA, TWO NODULES, 2.5 AND 1.0 CM.- MARGINS NOT INVOLVED.- METASTATIC CARCINOMA IN THREE OF SEVEN LYMPH NODES (3/7).  2. Breast, modified radical mastectomy , left - INVASIVE AND IN SITU DUCTAL CARCINOMA, 3.1 CM. - MARGINS NOT INVOLVED.- FIBROCYSTIC CHANGES AND FOCAL ATYPICAL LOBULAR HYPERPLASIA.- ONE BENIGN LYMPH NODE (0/1). 3. Lymph nodes, regional resection, Left axillary contents - SEVEN BENIGN LYMPH NODES (0/7).  Past/Anticipated interventions by medical oncology, if any: Chemotherapy : Dr. Lindi Adie note 01/24/16:Treatment plan: 1. Neoadjuvant chemotherapy with either AC followed by THP started 09/13/2015 and completed 12 weeks of Taxol 11/29/2015, Herceptin Perjeta 6 2. Followed by bilateral  mastectomies and lymph node surgery 3. Followed by adjuvant radiation 4. Followed by adjuvant antiestrogen therapy ------------------------------------------------------------------------------------------------------------------------------------ Current treatment: Completed weekly Taxol 12 with Herceptin Perjeta every 3 weeks , today is cycle 4 Herceptin and Perjeta Dr. Lindi Adie, MD Echocardiogram 08/24/2015: EF 60-65%  Lymphedema issues, if any:   NO  Pain issues, if any:  NO, numbness b/l arms under axilla to elbow,  from surgery  SAFETY ISSUES: NO  Prior radiation?  NO  Pacemaker/ICD? no  Possible current pregnancy? NO  Is the patient on methotrexate? NO  Current Complaints / other details:  MARRIED,  G3P3,  Daughter Olin Hauser,  translates for patient, decline our translator service  Non smoker, no tobacco  products, no alcohol or drug use   [place] Gotwalt, Felicita Gage, RN   02/27/2016,1:19 PM  BP (!) 170/79 (BP Location: Left Leg, Patient Position: Sitting, Cuff Size: Large)   Pulse 69   Temp 97.9 F (36.6 C) (Oral)   Resp 20   Ht _0  (1.499 m)   Wt 212 lb 12.8 oz (96.5 kg)   SpO2 98% Comment: room air  BMI 42.98 kg/m   Wt Readings from Last 3 Encounters:  03/03/16 212 lb 12.8 oz (96.5 kg)  02/11/16 217 lb 13 oz (98.8 kg)  02/06/16 218 lb 1.6 oz (98.9 kg)

## 2016-03-03 ENCOUNTER — Ambulatory Visit
Admission: RE | Admit: 2016-03-03 | Discharge: 2016-03-03 | Disposition: A | Discharge status: Home / Self Care | Payer: Self-pay | Source: Ambulatory Visit | Attending: Radiation Oncology | Admitting: Radiation Oncology

## 2016-03-03 ENCOUNTER — Encounter: Payer: Self-pay | Admitting: Radiation Oncology

## 2016-03-03 DIAGNOSIS — Z510 Encounter for antineoplastic radiation therapy: Secondary | ICD-10-CM | POA: Insufficient documentation

## 2016-03-03 DIAGNOSIS — C50812 Malignant neoplasm of overlapping sites of left female breast: Secondary | ICD-10-CM

## 2016-03-03 DIAGNOSIS — C50811 Malignant neoplasm of overlapping sites of right female breast: Secondary | ICD-10-CM | POA: Insufficient documentation

## 2016-03-03 DIAGNOSIS — Z171 Estrogen receptor negative status [ER-]: Secondary | ICD-10-CM | POA: Insufficient documentation

## 2016-03-03 NOTE — Progress Notes (Signed)
Please see the Nurse Progress Note in the MD Initial Consult Encounter for this patient. 

## 2016-03-03 NOTE — Progress Notes (Signed)
Radiation Oncology         [phone] ________________________________  Name: Felicia [name] MRN: [id number]  Date: 03/03/2016  DOB: 06/13/1960  CC:No PCP Per Patient  Nicholas Lose, MD     REFERRING PHYSICIAN: Nicholas Lose, MD   DIAGNOSIS: The encounter diagnosis was Malignant neoplasm of overlapping sites of both breasts in female, estrogen receptor negative ([place]).  Stage IIB (pT2N1aMX) of the right breast and Stage IIA (pT2N0MX) of the left breast  HISTORY OF PRESENT ILLNESS: Felicia [name] is a 55 y.o. female seen at the request of Dr. Lindi Adie for a recent diagnosis of bilateral breast cancer. She was found in April to have mammogram confirmation of a palpable mass in bilateral breasts. Her course is detailed below.      Bilateral breast cancer ([place])   08/16/2015 Mammogram    Left breast LIQ 4.2 cm, 4 mm oval mass 12:00, 1.2 cm mass left UOQ, enlarged left axillary lymph node      08/16/2015 Mammogram    Right breast UIQ: 3.9 cm irregular mass, 1.5 cm mass in the right LOQ, 9 mm mass right UOQ, 6 mm mass right LIQ, enlarged lymph node Rt axilla      08/20/2015 Initial Diagnosis    Right breast biopsy 1:00: IDC grade 2 ER 95%, PR 2%, Ki 67 15%; HER-2 negative ratio 1.04; 10:00: IDC grade 2-3 dissimilar to 1:00 biopsy 10:00: ER 95%, PR 0%, K67: 25%, HER-2 positive ratio 6.42 right axillary lymph node positive for metastatic cancer      08/20/2015 Procedure    Left biopsy 8:00: IDC with DCIS, grade 2; ER 95%, PR 95%, Ki-67 30%, HER-2 negative ratio 1.2; 12:00: IDC with DCIS, grade 1 ER 95%, PR 95%, Ki-67 10% HER-2 negative ratio 1.14 not similar to this biopsy, left axillary lymph node positive,       09/13/2015 - 01/17/2016 Neo-Adjuvant Chemotherapy    Neoadjuvant dose dense Adriamycin and Cytoxan followed by Taxol Herceptin Perjeta, followed by Herceptin maintenance      01/21/2016 Breast MRI    Bilateral breast masses significantly decreased in size with mild residual NME,  complete resolution of numerous other nodules in both breasts, bilateral axillary lymph nodes and bilateral retropectoral lymph nodes decreased in size      02/11/2016 Surgery    Right mastectomy: IDC 2 nodules, 2.5, 1 cm, margins negative, 3/7 lymph nodes positive T2 N1a stage II B, ER 95%, PR 2%, HER-2 negative, Ki-67 15% RCB burden 3.1 and 2.9 class II      02/11/2016 Surgery    Left mastectomy: IDC 3.1 cm with DCIS, margins negative, 0/8 lymph nodes, T2 N0 stage II a, RCB burden 2.18, class II, ER 100%, PR 95%, HER-2 negative ratio 1.2, Ki-67 30%       As above, the patient completed neoadjuvant chemotherapy, followed by surgery on 02/11/16. She comes today to discuss the role for radiotherapy to bilateral chest walls and regional lymph nodes.   PREVIOUS RADIATION THERAPY: No   PAST MEDICAL HISTORY:  Past Medical History:  Diagnosis Date  . Cancer ([place])    breast  . GERD (gastroesophageal reflux disease)        PAST SURGICAL HISTORY: Past Surgical History:  Procedure Laterality Date  . MASTECTOMY MODIFIED RADICAL Bilateral 02/11/2016  . MASTECTOMY MODIFIED RADICAL Bilateral 02/11/2016   Procedure: BILATERAL MASTECTOMY MODIFIED RADICAL;  Surgeon: Rolm Bookbinder, MD;  Location: [facility];  Service: General;  Laterality: Bilateral;  . PORTACATH PLACEMENT N/A 09/04/2015  Procedure: INSERTION PORT-A-CATH WITH [place];  Surgeon: Rolm Bookbinder, MD;  Location: [facility];  Service: General;  Laterality: N/A;  . TUBAL LIGATION       FAMILY HISTORY:  Family History  Problem Relation Age of Onset  . Diabetes Mother      SOCIAL HISTORY:  reports that she has never smoked. She has never used smokeless tobacco. She reports that she does not drink alcohol or use drugs.   ALLERGIES: No known allergies   MEDICATIONS:  Current Outpatient Prescriptions  Medication Sig Dispense Refill  . gabapentin (NEURONTIN) 100 MG capsule Take 1 capsule ('100mg'$ ) by mouth three  times per day for 1 week.  Increase to 2 capsules ('200mg'$ ) by mouth three times per day for 1 week. Then increase to 3 capsules ('300mg'$ ) by mouth three times per day. (Patient taking differently: Take 100 mg by mouth at bedtime. ) 130 capsule 0  . lidocaine-prilocaine (EMLA) cream Apply 1 application topically as needed (Apply 2- hours prior to access of port).    Marland Kitchen omeprazole (PRILOSEC) 20 MG capsule Take 1 capsule (20 mg total) by mouth daily. 30 capsule 0  . potassium chloride SA (K-DUR,KLOR-CON) 20 MEQ tablet TAKE 1 TABLET BY MOUTH ONCE DAILY 30 tablet 0  . HYDROcodone-acetaminophen (NORCO) 10-325 MG tablet Take 1 tablet by mouth every 6 (six) hours as needed. (Patient not taking: Reported on 03/03/2016) 10 tablet 0  . HYDROcodone-acetaminophen (NORCO/VICODIN) 5-325 MG tablet Take 1-2 tablets by mouth every 4 (four) hours as needed for moderate pain. (Patient not taking: Reported on 03/03/2016) 20 tablet 0  . LORazepam (ATIVAN) 0.5 MG tablet Take 0.5 mg by mouth every 6 (six) hours as needed for anxiety.    . magic mouthwash w/lidocaine SOLN Take 5 mLs by mouth 3 (three) times daily as needed for mouth pain. (Patient not taking: Reported on 03/03/2016) 100 mL 0  . naproxen (NAPROSYN) 500 MG tablet Take 500 mg by mouth 2 (two) times daily as needed (for pain.).     Marland Kitchen prochlorperazine (COMPAZINE) 10 MG tablet Take 10 mg by mouth every 6 (six) hours as needed for nausea or vomiting.     No current facility-administered medications for this encounter.      REVIEW OF SYSTEMS: On review of systems, the patient reports that she is doing well overall since her surgery. She has had her JP drains removed. She has some numbness of the chest wall since surgery, but denies any sternal chest pain, shortness of breath, cough, fevers, chills, night sweats, unintended weight changes. She denies any bowel or bladder disturbances, and denies abdominal pain, nausea or vomiting. She denies any new musculoskeletal or joint  aches or pains. A complete 10 point review of systems is obtained and is otherwise negative.     PHYSICAL EXAM:  Wt Readings from Last 3 Encounters:  03/03/16 212 lb 12.8 oz (96.5 kg)  02/11/16 217 lb 13 oz (98.8 kg)  02/06/16 218 lb 1.6 oz (98.9 kg)   Temp Readings from Last 3 Encounters:  03/03/16 97.9 F (36.6 C) (Oral)  02/12/16 98.2 F (36.8 C) (Oral)  02/06/16 97.4 F (36.3 C)   BP Readings from Last 3 Encounters:  03/03/16 (!) 170/79  02/12/16 (!) 122/50  02/06/16 (!) 154/75   Pulse Readings from Last 3 Encounters:  03/03/16 69  02/12/16 62  02/06/16 75     Pain scale 0/10 In general this is a well appearing Hispanic female in no acute distress. She  is alert and oriented x4 and appropriate throughout the examination. HEENT reveals that the patient is normocephalic, atraumatic. EOMs are intact. PERRLA. Skin is intact without any evidence of gross lesions. Cardiovascular exam reveals a regular rate and rhythm, no clicks rubs or murmurs are auscultated. Chest is clear to auscultation bilaterally. Lymphatic assessment is performed and does not reveal any adenopathy in the cervical, supraclavicular, axillary, or inguinal chains. Bilateral mastectomy sites are intact with steristrips. No palpable edema of the chest wall or extremities are noted. No cellulitic changes of her incision sites are noted.  Abdomen has active bowel sounds in all quadrants and is intact. The abdomen is soft, non tender, non distended. Lower extremities are negative for pretibial pitting edema, deep calf tenderness, cyanosis or clubbing.   ECOG = 1  0 - Asymptomatic (Fully active, able to carry on all predisease activities without restriction)  1 - Symptomatic but completely ambulatory (Restricted in physically strenuous activity but ambulatory and able to carry out work of a light or sedentary nature. For example, light housework, office work)  2 - Symptomatic, <50% in bed during the day (Ambulatory  and capable of all self care but unable to carry out any work activities. Up and about more than 50% of waking hours)  3 - Symptomatic, >50% in bed, but not bedbound (Capable of only limited self-care, confined to bed or chair 50% or more of waking hours)  4 - Bedbound (Completely disabled. Cannot carry on any self-care. Totally confined to bed or chair)  5 - Death   Eustace Pen MM, Creech RH, Tormey DC, et al. [phone]). "Toxicity and response criteria of the [facility]". [place] Oncol. 5 (6): 649-55    LABORATORY DATA:  Lab Results  Component Value Date   WBC 9.9 02/12/2016   HGB 11.1 (L) 02/12/2016   HCT 33.5 (L) 02/12/2016   MCV 87.9 02/12/2016   PLT 184 02/12/2016   Lab Results  Component Value Date   NA 138 02/12/2016   K 3.1 (L) 02/12/2016   CL 107 02/12/2016   CO2 23 02/12/2016   Lab Results  Component Value Date   ALT 70 (H) 01/24/2016   AST 63 (H) 01/24/2016   ALKPHOS 71 01/24/2016   BILITOT 0.68 01/24/2016      RADIOGRAPHY: No results found.     IMPRESSION/PLAN: 1. Bilateral ER/PR positive, Stage IIB (pT2N1aMX) of the right breast and Stage IIA (pT2N0MX) of the left breast. The patient has completed her neoadjuvant treatment and also surgical resection. We discussed the role for radiotherapy and will move forward with scheduling her to return next week for simulation. Following radiation, she would also continue on estrogen blockade with Dr. Lindi Adie. We discussed the risks, benefits, short, and long term effects of radiotherapy, and the patient is interested in proceeding. Dr. Lisbeth Renshaw discusses the delivery and logistics of radiotherapy, and recommeds a course of 33 fractions over 6 1/2 weeks of treatment. Written consent is obtained and we will begin treatment in the next two weeks. 2. Possible genetic predisposition to malignancy. The patient is interested in meeting with genetic counseling, and I've asked the counselors to look at her history to  see if she is a candidate for free testing with [facility].    The above documentation reflects my direct findings during this shared patient visit. Please see the separate note by Dr. Lisbeth Renshaw on this date for the remainder of the patient's plan of care.    Carola Rhine,  PAC  This document serves as a record of services personally performed by Shona Simpson, PA-C and Kyung Rudd, MD. It was created on their behalf by Darcus Austin, a trained medical scribe. The creation of this record is based on the scribe's personal observations and the providers' statements to them. This document has been checked and approved by the attending provider.

## 2016-03-05 ENCOUNTER — Ambulatory Visit: Payer: Self-pay | Attending: General Surgery | Admitting: Physical Therapy

## 2016-03-05 DIAGNOSIS — R29898 Other symptoms and signs involving the musculoskeletal system: Secondary | ICD-10-CM

## 2016-03-05 DIAGNOSIS — M25611 Stiffness of right shoulder, not elsewhere classified: Secondary | ICD-10-CM

## 2016-03-05 DIAGNOSIS — M25612 Stiffness of left shoulder, not elsewhere classified: Secondary | ICD-10-CM

## 2016-03-05 DIAGNOSIS — Z483 Aftercare following surgery for neoplasm: Secondary | ICD-10-CM

## 2016-03-05 NOTE — Therapy (Signed)
[place], [place], [address] Phone: [phone]   Fax:  [phone]  Physical Therapy Evaluation  Patient Details  Name: Felicia [name] MRN: [id number] Date of Birth: [birth year] Referring Provider: Dr. Rolm Bookbinder  Encounter Date: 03/05/2016      PT End of Session - 03/05/16 1239    Visit Number 1   Number of Visits 2  to 3 if needed   Date for PT Re-Evaluation 04/11/16   PT Start Time 0846   PT Stop Time 0931   PT Time Calculation (min) 45 min   Activity Tolerance Patient tolerated treatment well   Behavior During Therapy [facility] for tasks assessed/performed      Past Medical History:  Diagnosis Date  . Cancer ([place])    breast  . GERD (gastroesophageal reflux disease)     Past Surgical History:  Procedure Laterality Date  . MASTECTOMY MODIFIED RADICAL Bilateral 02/11/2016  . MASTECTOMY MODIFIED RADICAL Bilateral 02/11/2016   Procedure: BILATERAL MASTECTOMY MODIFIED RADICAL;  Surgeon: Rolm Bookbinder, MD;  Location: [place];  Service: General;  Laterality: Bilateral;  . PORTACATH PLACEMENT N/A 09/04/2015   Procedure: INSERTION PORT-A-CATH WITH [place];  Surgeon: Rolm Bookbinder, MD;  Location: [place];  Service: General;  Laterality: N/A;  . TUBAL LIGATION      There were no vitals filed for this visit.       Subjective Assessment - 03/05/16 0855    Subjective having some numbness   Pertinent History Neo-adjuvant chemotherapy, then bilateral mastectomies 02/11/16 with 7 nodes removed on rigth (3 positive) and 8 benign nodes on left.  She is getting herceptin every three weeks now.  Plan is to have XRT but she doesn't know when that will start.  Otherwise healthy, per her report.   Patient Stated Goals to use hands and arms better   Currently in Pain? No/denies            [facility] PT Assessment - 03/05/16 0001      Assessment   Medical Diagnosis s/p bilateral breast  cancer with bilat. mastectomies with 7 and 8 nodes removed   Referring Provider Dr. Rolm Bookbinder   Onset Date/Surgical Date 02/11/16     Precautions   Precautions Other (comment)   Precaution Comments cancer     Restrictions   Weight Bearing Restrictions No     Balance Screen   Has the patient fallen in the past 6 months No   Has the patient had a decrease in activity level because of a fear of falling?  No   Is the patient reluctant to leave their home because of a fear of falling?  No     Home Environment   Living Environment Private residence   Living Arrangements Spouse/significant other;Children   Type of [place] One level     Prior Function   Level of [place] Unemployed   Leisure Used to do yoga and plans to return to this daily, at home.     Cognition   Overall Cognitive Status Within Functional Limits for tasks assessed     Observation/Other Assessments   Skin Integrity long incisions bilat. at chest with steri strips still in place; small bandaids at left lateral incision over small openings in incisions.  Bandaids over drain sites bilat.; last ones came out 02/28/16.  Small edematous dogears at lateral aspect of both incisions.     Posture/Postural Control  Posture/Postural Control Postural limitations   Postural Limitations Rounded Shoulders;Forward head  significant rounded shoulders     ROM / Strength   AROM / PROM / Strength AROM     AROM   AROM Assessment Site Shoulder   Right/Left Shoulder Right;Left   Right Shoulder Flexion 137 Degrees   Right Shoulder ABduction 145 Degrees   Right Shoulder Internal Rotation 62 Degrees  in sitting   Right Shoulder External Rotation 74 Degrees   Left Shoulder Flexion 139 Degrees   Left Shoulder ABduction 150 Degrees   Left Shoulder Internal Rotation 67 Degrees  in sitting   Left Shoulder External Rotation 90 Degrees     Palpation   Palpation comment decreased  soft tissue mobility around incisions bilat.     Ambulation/Gait   Ambulation/Gait Yes   Ambulation/Gait Assistance 7: Independent           LYMPHEDEMA/ONCOLOGY QUESTIONNAIRE - 03/05/16 0914      Lymphedema Assessments   Lymphedema Assessments Upper extremities     Right Upper Extremity Lymphedema   10 cm Proximal to Olecranon Process 37.5 cm   Olecranon Process 28.7 cm   10 cm Proximal to Ulnar Styloid Process 26.2 cm   Just Proximal to Ulnar Styloid Process 19.4 cm   Across Hand at [employer] 20.5 cm   At [place] of 2nd Digit 6.8 cm     Left Upper Extremity Lymphedema   10 cm Proximal to Olecranon Process 37.8 cm   Olecranon Process 28.6 cm   10 cm Proximal to Ulnar Styloid Process 25.7 cm   Just Proximal to Ulnar Styloid Process 19.8 cm   Across Hand at [employer] 20 cm   At [place] of 2nd Digit 6.7 cm                        PT Education - 03/05/16 1238    Education provided Yes   Education Details brief instruction about the risk of lymphedema begun today  through interpreter   Person(s) Educated Patient;Child(ren)   Methods Explanation   Comprehension Verbalized understanding;Need further instruction                [facility] Goals - 03/05/16 1246      CC Long Term Goal  #1   Title Patient will be knowledgeable about lymphedema risk reduction practices   Time 4   Period Weeks   Status New     CC Long Term Goal  #2   Title Patient will be knowledgeable about how to perform scar/soft tissue mobilization for tight tissue around mastectomy incisions   Time 4   Period Weeks   Status New            Plan - 03/05/16 1240    Clinical Impression Statement Patient is just three weeks post bilateral mastectomy and is doing fairly well with AROM for this stage.  She has a couple of small openings around the left incision that she is concerned about and was told to call her surgeon about those.  She does have tight soft tissue  around both incisions and some risk for developing lymphedema on both sides, having had 7-8 nodes removed on each side; she will also have radiation treatment.   Rehab Potential Excellent   Clinical Impairments Affecting Rehab Potential couple of small open spots at left incision at time of eval   PT Frequency --  1 visit, a 2nd if needed  PT Treatment/Interventions ADLs/Self Care Home Management;DME Instruction;Patient/family education;Manual techniques;Scar mobilization   PT Next Visit Plan Educate about lymphedema and lymphedema risk reduction (such as is done in ABC class); advise about obtaining compression sleeves; teach how she would do soft tissue mobilization to chest incision areas once she is adequately healed there, to improve her flexibility.   PT Home Exercise Plan continue [place] exercises   Consulted and Agree with Plan of Care Patient;Family member/caregiver      Patient will benefit from skilled therapeutic intervention in order to improve the following deficits and impairments:  Decreased knowledge of precautions, Decreased knowledge of use of DME, Decreased scar mobility  Visit Diagnosis: Aftercare following surgery for neoplasm - Plan: PT plan of care cert/re-cert  Stiffness of right shoulder, not elsewhere classified - Plan: PT plan of care cert/re-cert  Stiffness of left shoulder, not elsewhere classified - Plan: PT plan of care cert/re-cert  Other symptoms and signs involving the musculoskeletal system - Plan: PT plan of care cert/re-cert     Problem List Patient Active Problem List   Diagnosis Date Noted  . 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 Hypersensitivity reaction 11/05/2015  . Bilateral malignant neoplasm of overlapping sites of breast in female [facility]) 08/30/2015  . Bilateral breast cancer ([place]) 08/23/2015    SALISBURY,DONNA 03/05/2016, 12:50 PM  [place], [place], [address] Phone: [phone]   Fax:  [phone]  Name: Felicia [name] MRN: [id number] Date of Birth: 05-28-1960  Serafina Royals, PT 03/05/16 12:50 PM

## 2016-03-06 ENCOUNTER — Ambulatory Visit: Payer: Self-pay

## 2016-03-06 ENCOUNTER — Other Ambulatory Visit: Payer: Self-pay

## 2016-03-10 ENCOUNTER — Ambulatory Visit
Admission: RE | Admit: 2016-03-10 | Discharge: 2016-03-10 | Disposition: A | Discharge status: Home / Self Care | Payer: Self-pay | Source: Ambulatory Visit | Attending: Radiation Oncology | Admitting: Radiation Oncology

## 2016-03-10 DIAGNOSIS — C50812 Malignant neoplasm of overlapping sites of left female breast: Principal | ICD-10-CM

## 2016-03-10 DIAGNOSIS — Z171 Estrogen receptor negative status [ER-]: Principal | ICD-10-CM

## 2016-03-10 DIAGNOSIS — C50811 Malignant neoplasm of overlapping sites of right female breast: Secondary | ICD-10-CM

## 2016-03-13 ENCOUNTER — Ambulatory Visit (HOSPITAL_BASED_OUTPATIENT_CLINIC_OR_DEPARTMENT_OTHER): Payer: Self-pay

## 2016-03-13 ENCOUNTER — Other Ambulatory Visit: Payer: Self-pay

## 2016-03-13 ENCOUNTER — Other Ambulatory Visit (HOSPITAL_BASED_OUTPATIENT_CLINIC_OR_DEPARTMENT_OTHER): Payer: Self-pay

## 2016-03-13 VITALS — BP 143/73

## 2016-03-13 DIAGNOSIS — C50811 Malignant neoplasm of overlapping sites of right female breast: Secondary | ICD-10-CM

## 2016-03-13 DIAGNOSIS — Z171 Estrogen receptor negative status [ER-]: Principal | ICD-10-CM

## 2016-03-13 DIAGNOSIS — C50812 Malignant neoplasm of overlapping sites of left female breast: Secondary | ICD-10-CM

## 2016-03-13 DIAGNOSIS — C50011 Malignant neoplasm of nipple and areola, right female breast: Secondary | ICD-10-CM

## 2016-03-13 DIAGNOSIS — C50411 Malignant neoplasm of upper-outer quadrant of right female breast: Secondary | ICD-10-CM

## 2016-03-13 DIAGNOSIS — C50211 Malignant neoplasm of upper-inner quadrant of right female breast: Secondary | ICD-10-CM

## 2016-03-13 DIAGNOSIS — Z5112 Encounter for antineoplastic immunotherapy: Secondary | ICD-10-CM

## 2016-03-13 DIAGNOSIS — C50312 Malignant neoplasm of lower-inner quadrant of left female breast: Secondary | ICD-10-CM

## 2016-03-13 LAB — COMPREHENSIVE METABOLIC PANEL
ALT: 33 U/L (ref 0–55)
ANION GAP: 9 meq/L (ref 3–11)
AST: 29 U/L (ref 5–34)
Albumin: 3.7 g/dL (ref 3.5–5.0)
Alkaline Phosphatase: 93 U/L (ref 40–150)
BILIRUBIN TOTAL: 0.6 mg/dL (ref 0.20–1.20)
BUN: 12.1 mg/dL (ref 7.0–26.0)
CO2: 24 meq/L (ref 22–29)
CREATININE: 0.7 mg/dL (ref 0.6–1.1)
Calcium: 9.5 mg/dL (ref 8.4–10.4)
Chloride: 106 mEq/L (ref 98–109)
EGFR: >90 mL/min/{1.73_m2} (ref >90)
GLUCOSE: 107 mg/dL (ref 70–140)
Potassium: 3.7 mEq/L (ref 3.5–5.1)
SODIUM: 139 meq/L (ref 136–145)
TOTAL PROTEIN: 7.1 g/dL (ref 6.4–8.3)

## 2016-03-13 LAB — CBC WITH DIFFERENTIAL/PLATELET
BASO%: 0.8 % (ref 0.0–2.0)
Basophils Absolute: 0 10*3/uL (ref 0.0–0.1)
EOS%: 1.7 % (ref 0.0–7.0)
Eosinophils Absolute: 0.1 10*3/uL (ref 0.0–0.5)
HCT: 40.7 % (ref 34.8–46.6)
HGB: 13.1 g/dL (ref 11.6–15.9)
LYMPH%: 39.1 % (ref 14.0–49.7)
MCH: 27.7 pg (ref 25.1–34.0)
MCHC: 32.3 g/dL (ref 31.5–36.0)
MCV: 85.7 fL (ref 79.5–101.0)
MONO#: 0.4 10*3/uL (ref 0.1–0.9)
MONO%: 7.1 % (ref 0.0–14.0)
NEUT%: 51.3 % (ref 38.4–76.8)
NEUTROS ABS: 3.1 10*3/uL (ref 1.5–6.5)
PLATELETS: 200 10*3/uL (ref 145–400)
RBC: 4.75 10*6/uL (ref 3.70–5.45)
RDW: 13.2 % (ref 11.2–14.5)
WBC: 6 10*3/uL (ref 3.9–10.3)
lymph#: 2.4 10*3/uL (ref 0.9–3.3)

## 2016-03-13 MED ORDER — DIPHENHYDRAMINE HCL 25 MG PO CAPS
50.0000 mg | ORAL_CAPSULE | Freq: Once | ORAL | Status: AC
  Administered 2016-03-13: 50 mg via ORAL

## 2016-03-13 MED ORDER — ACETAMINOPHEN 325 MG PO TABS
650.0000 mg | ORAL_TABLET | Freq: Once | ORAL | Status: AC
  Administered 2016-03-13: 650 mg via ORAL

## 2016-03-13 MED ORDER — DIPHENHYDRAMINE HCL 25 MG PO CAPS
ORAL_CAPSULE | ORAL | Status: AC
  Filled 2016-03-13: qty 2

## 2016-03-13 MED ORDER — SODIUM CHLORIDE 0.9% FLUSH
10.0000 mL | INTRAVENOUS | Status: DC | PRN
  Administered 2016-03-13: 10 mL
  Filled 2016-03-13: qty 10

## 2016-03-13 MED ORDER — HEPARIN SOD (PORK) LOCK FLUSH 100 UNIT/ML IV SOLN
500.0000 [IU] | Freq: Once | INTRAVENOUS | Status: AC | PRN
  Administered 2016-03-13: 500 [IU]
  Filled 2016-03-13: qty 5

## 2016-03-13 MED ORDER — SODIUM CHLORIDE 0.9 % IV SOLN
Freq: Once | INTRAVENOUS | Status: AC
  Administered 2016-03-13: 11:00:00 via INTRAVENOUS

## 2016-03-13 MED ORDER — TRASTUZUMAB CHEMO 150 MG IV SOLR
6.0000 mg/kg | Freq: Once | INTRAVENOUS | Status: AC
  Administered 2016-03-13: 588 mg via INTRAVENOUS
  Filled 2016-03-13: qty 28

## 2016-03-13 NOTE — Patient Instructions (Addendum)
[facility] Discharge Instructions for Patients Receiving Chemotherapy  Today you received the following chemotherapy agents Herceptin To help prevent nausea and vomiting after your treatment, we encourage you to take your nausea medication as directed.   If you develop nausea and vomiting that is not controlled by your nausea medication, call the clinic.   BELOW ARE SYMPTOMS THAT SHOULD BE REPORTED IMMEDIATELY:  *FEVER GREATER THAN 100.5 F  *CHILLS WITH OR WITHOUT FEVER  NAUSEA AND VOMITING THAT IS NOT CONTROLLED WITH YOUR NAUSEA MEDICATION  *UNUSUAL SHORTNESS OF BREATH  *UNUSUAL BRUISING OR BLEEDING  TENDERNESS IN MOUTH AND THROAT WITH OR WITHOUT PRESENCE OF ULCERS  *URINARY PROBLEMS  *BOWEL PROBLEMS  UNUSUAL RASH Items with * indicate a potential emergency and should be followed up as soon as possible.  Feel free to call the clinic you have any questions or concerns. The clinic phone number is [phone].  Please show the [place] at check-in to the Emergency Department and triage nurse.  Trastuzumab injection for infusion Qu es este medicamento? El TRASTUZUMAB es un anticuerpo monoclonal. Genene Churn para tratar el cncer de mama y de [place]. Este medicamento puede ser utilizado para otros usos; si tiene alguna pregunta consulte con su proveedor de atencin mdica o con su farmacutico. Qu le debo informar a mi profesional de la salud antes de tomar este medicamento? Necesita saber si usted presenta alguno de los siguientes problemas o situaciones: -enfermedad cardiaca -insuficiencia cardiaca -infeccin (especialmente infecciones virales, como la varicela o el herpes) -enfermedad pulmonar o respiratoria, como asma -radioterapia reciente o continuada -una reaccin alrgica o inusual al trastuzumab, al alcohol benclico, a otros medicamentos, alimentos, colorantes o conservantes -si est embarazada o buscando quedar embarazada -si est  amamantando a un beb Cmo debo utilizar este medicamento? Este medicamento se administra mediante infusin por va intravenosa. Lo administra un profesional de la salud calificado en un hospital o en un entorno clnico. Hable con su pediatra para informarse acerca del uso de este medicamento en nios. Este medicamento no est aprobado para uso en nios. Sobredosis: Pngase en contacto inmediatamente con un centro toxicolgico o una sala de urgencia si usted cree que haya tomado demasiado medicamento. ATENCIN: [employer] es solo para usted. No comparta este medicamento con nadie. Qu sucede si me olvido de una dosis? Es importante no olvidar ninguna dosis. Informe a su mdico o a su profesional de la salud si no puede asistir a Photographer. Qu puede interactuar con este medicamento? -doxorubicina -warfarina Puede ser que esta lista no menciona todas las posibles interacciones. Informe a su profesional de [employer]	[place] de [employer] productos a base de hierbas, medicamentos de [place] o suplementos nutritivos que est tomando. Si usted fuma, consume bebidas alcohlicas o si utiliza drogas ilegales, indqueselo tambin a su profesional de KB Home	[place]. Algunas sustancias pueden interactuar con su medicamento. A qu debo estar atento al usar [employer]? Visite a su mdico para chequear su evolucin peridicamente. Si presenta algn efecto secundario, infrmelo. Sin embargo, contine con el tratamiento aun si se siente enfermo, a menos que su mdico le indique que lo suspenda. Consulte a su mdico o a su profesional de la salud si tiene fiebre, escalofros, dolor de garganta, o cualquier otro sntoma de resfro o gripe. No se trate usted mismo. Trate de no acercarse a personas que estn enfermas. Es posible que tenga [place], escalofros y temblores durante su primera infusin. Estos efectos generalmente son leves y pueden tratarse  con otros medicamentos. Informe cualquier efecto secundario  durante la infusin a su profesional de [employer]	[place]. La fiebre y los escalofros por lo general no suceden con las infusiones posteriores. No debe quedar embarazada mientras est tomando este medicamento o por 7 meses despus de dejar de usarlo. Las mujeres deben informar a su mdico si estn buscando quedar embarazadas o si creen que estn embarazadas. Las mujeres con la posibilidad de Best boy nios deben tener una prueba de embarazo negativa antes de Art gallery manager a tomar este medicamento. Existe la posibilidad de que ocurran efectos secundarios graves a un beb sin nacer. Para ms informacin hable con su profesional de la salud o su farmacutico. No debe amamantar a un beb mientras est tomando este medicamento o por 7 meses despus de dejar de usarlo. Las [employer] deben usar un mtodo anticonceptivo eficaz con este medicamento. Qu efectos secundarios puedo tener al [employer] este medicamento? Efectos secundarios que debe informar a su mdico o a Office manager de la salud tan pronto como sea posible: -problemas respiratorios -Tourist information centre manager o palpitaciones -tos -mareos o desmayos -fiebre o escalofros, dolor de garganta -erupcin cutnea, picazn o urticarias -hinchazn de los tobillos o las piernas -cansancio o debilidad inusual Efectos secundarios que, por lo general, no requieren Geophysical data processor (debe informarlos a su mdico u otro profesional de la salud si persisten o si son molestos): -prdida del apetito -dolor de cabeza -dolores musculares -nuseas Puede ser que esta lista no menciona todos los posibles efectos secundarios. Comunquese a su mdico por asesoramiento mdico [employer]. Usted puede informar los efectos secundarios a la FDA por telfono al 1-800-FDA-1088. Dnde debo guardar mi medicina? Este medicamento se administra en hospitales o clnicas y no necesitar guardarlo en su domicilio. ATENCIN: Este folleto es un resumen. Puede ser que no cubra toda la  posible informacin. Si usted tiene preguntas acerca de esta medicina, consulte con su mdico, su farmacutico o su profesional de Technical sales engineer.

## 2016-03-17 ENCOUNTER — Ambulatory Visit: Payer: Self-pay | Admitting: Radiation Oncology

## 2016-03-18 ENCOUNTER — Ambulatory Visit: Payer: Self-pay

## 2016-03-19 ENCOUNTER — Ambulatory Visit: Payer: Self-pay

## 2016-03-24 ENCOUNTER — Ambulatory Visit: Payer: Self-pay

## 2016-03-24 ENCOUNTER — Ambulatory Visit: Admission: RE | Admit: 2016-03-24 | Payer: Self-pay | Source: Ambulatory Visit | Admitting: Radiation Oncology

## 2016-03-25 ENCOUNTER — Ambulatory Visit: Payer: Self-pay

## 2016-03-25 ENCOUNTER — Ambulatory Visit: Payer: Self-pay | Admitting: Radiation Oncology

## 2016-03-26 ENCOUNTER — Ambulatory Visit: Payer: Self-pay | Admitting: Physical Therapy

## 2016-03-26 ENCOUNTER — Ambulatory Visit: Payer: Self-pay

## 2016-03-26 DIAGNOSIS — M25611 Stiffness of right shoulder, not elsewhere classified: Secondary | ICD-10-CM

## 2016-03-26 DIAGNOSIS — M25612 Stiffness of left shoulder, not elsewhere classified: Secondary | ICD-10-CM

## 2016-03-26 DIAGNOSIS — Z483 Aftercare following surgery for neoplasm: Secondary | ICD-10-CM

## 2016-03-26 NOTE — Patient Instructions (Signed)

## 2016-03-26 NOTE — Therapy (Signed)
[place], [place], [address] Phone: [phone]   Fax:  [phone]  Physical Therapy Treatment  Patient Details  Name: Felicia [name] MRN: [id number] Date of Birth: 1961/03/07 Referring Provider: Dr. Rolm Bookbinder  Encounter Date: 03/26/2016      PT End of Session - 03/26/16 1256    Visit Number 2   Number of Visits 2   Date for PT Re-Evaluation 04/11/16   PT Start Time 0930   PT Stop Time 1010   PT Time Calculation (min) 40 min   Activity Tolerance Patient tolerated treatment well   Behavior During Therapy [facility] for tasks assessed/performed      Past Medical History:  Diagnosis Date  . Cancer ([place])    breast  . GERD (gastroesophageal reflux disease)     Past Surgical History:  Procedure Laterality Date  . MASTECTOMY MODIFIED RADICAL Bilateral 02/11/2016  . MASTECTOMY MODIFIED RADICAL Bilateral 02/11/2016   Procedure: BILATERAL MASTECTOMY MODIFIED RADICAL;  Surgeon: Rolm Bookbinder, MD;  Location: [facility];  Service: General;  Laterality: Bilateral;  . PORTACATH PLACEMENT N/A 09/04/2015   Procedure: INSERTION PORT-A-CATH WITH [place];  Surgeon: Rolm Bookbinder, MD;  Location: [facility];  Service: General;  Laterality: N/A;  . TUBAL LIGATION      There were no vitals filed for this visit.      Subjective Assessment - 03/26/16 0942    Subjective pt is having tightness across the chest despite self massage. Still has a small open area and doesn't know what when radiation with start.  she is able to demonstrated range of motion exercise actively and with dowel rod.    Patient is accompained by: Interpreter   Pertinent History Neo-adjuvant chemotherapy, then bilateral mastectomies 02/11/16 with 7 nodes removed on rigth (3 positive) and 8 benign nodes on left.  She is getting herceptin every three weeks now.  Plan is to have XRT but she doesn't know when that will start.   Otherwise healthy, per her report.   Patient Stated Goals to use hands and arms better            [facility] PT Assessment - 03/26/16 0001      Observation/Other Assessments   Skin Integrity closed raised red area at left mid forearm that pt was aware of that looks like it had mild edema around it.  Pt says it is better than yesterday.  It is not warm or tender.  Encouraged her to call the MD if it does not continue to improve or if redness increased in size or it becomes more swollen or tender.                      [facility] Adult PT Treatment/Exercise - 03/26/16 0001      Self-Care   Self-Care Other Self-Care Comments   Other Self-Care Comments  lymphedema risk reduction practices, pt acknowledged understanding through interpreter . continue with range of motion exercise, continue with self massage,  walk daily, do exexrcise classes at cancer center.     Lumbar Exercises: Supine   Other Supine Lumbar Exercises tansverse abdominal series including abd sets, single knee to chest, bridges      Shoulder Exercises: Sidelying   Other Sidelying Exercises shoulder horizontal abduction with upper thoracic backward rotation to stretch anteior chest                 PT Education - 03/26/16 1015    Education  provided Yes   Education Details lymphedema risk reduction practices, skin monitoring, knitted knockers brochure, exercise at cancer centre brochure, contact [facility] for financial assist    Person(s) Educated Patient   Methods Explanation;Handout   Comprehension Verbalized understanding  through interpreter                 [place] - 03/26/16 1258      [place] Term Goal  #1   Title Patient will be knowledgeable about lymphedema risk reduction practices   Status Achieved     CC Long Term Goal  #2   Title Patient will be knowledgeable about how to perform scar/soft tissue mobilization for tight tissue around mastectomy incisions   Status Achieved             Plan - 03/26/16 1256    Clinical Impression Statement Pt is performing exercise and self massage at home and is doing well.  She understands lymphedema risk reduction and will contact MD for follow up order if swelling becomes a concern.  She will follow up with [facility] for mastectomy  bra assist. She does not need further PT    Rehab Potential Excellent   Clinical Impairments Affecting Rehab Potential couple of small open spots at left incision at time of eval, still healing on 03/26/2016   PT Treatment/Interventions ADLs/Self Care Home Management;DME Instruction;Patient/family education;Manual techniques;Scar mobilization   PT Next Visit Plan Discharge this episode    Consulted and Agree with Plan of Care Patient      Patient will benefit from skilled therapeutic intervention in order to improve the following deficits and impairments:  Decreased knowledge of precautions, Decreased knowledge of use of DME, Decreased scar mobility  Visit Diagnosis: Aftercare following surgery for neoplasm  Stiffness of right shoulder, not elsewhere classified  Stiffness of left shoulder, not elsewhere classified     Problem List Patient Active Problem List   Diagnosis Date Noted  . 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 Hypersensitivity reaction 11/05/2015  . Bilateral malignant neoplasm of overlapping sites of breast in female [facility]) 08/30/2015  . Bilateral breast cancer ([place]) 08/23/2015   PHYSICAL THERAPY DISCHARGE SUMMARY  Visits from Start of Care: 2 Current functional level related to goals / functional outcomes: Independent in self care    Remaining deficits: Chest tissue tightness    Education / Equipment: Home exercise , lymphedema risk reduction  Plan: Patient agrees to discharge.  Patient goals were met. Patient is being discharged due to meeting the stated rehab goals.  ?????    Donato Heinz. Owens Shark PT  Norwood Levo 03/26/2016,  12:59 PM  [place], [place], [address] Phone: [phone]   Fax:  [phone]  Name: Felicia [name] MRN: [id number] Date of Birth: Nov 13, 1960

## 2016-03-27 ENCOUNTER — Other Ambulatory Visit: Payer: Self-pay

## 2016-03-27 ENCOUNTER — Ambulatory Visit: Payer: Self-pay | Admitting: Hematology and Oncology

## 2016-03-27 ENCOUNTER — Ambulatory Visit: Payer: Self-pay

## 2016-03-28 ENCOUNTER — Ambulatory Visit: Payer: Self-pay

## 2016-03-31 ENCOUNTER — Ambulatory Visit: Payer: Self-pay

## 2016-03-31 ENCOUNTER — Other Ambulatory Visit: Payer: Self-pay

## 2016-03-31 ENCOUNTER — Ambulatory Visit: Payer: Self-pay | Admitting: Radiation Oncology

## 2016-03-31 MED ORDER — GABAPENTIN 100 MG PO CAPS: ORAL_CAPSULE | ORAL | 0 refills | Status: DC

## 2016-03-31 NOTE — Telephone Encounter (Signed)
Refilled prescription with for neurontin for pt today. Confirmed with pt with direction of use. Pt states that she only takes 1 tab daily at night. Told pt that the directions are different on her prescription. Told pt that will hold off on refilling her medication until she sees Dr.Gudena on Thursday for her appt. Still has neurontin left at home this week.

## 2016-04-01 ENCOUNTER — Ambulatory Visit: Payer: Self-pay | Attending: Radiation Oncology

## 2016-04-01 ENCOUNTER — Ambulatory Visit: Payer: Self-pay | Admitting: Radiation Oncology

## 2016-04-01 ENCOUNTER — Ambulatory Visit: Payer: Self-pay

## 2016-04-02 ENCOUNTER — Ambulatory Visit: Payer: Self-pay

## 2016-04-02 ENCOUNTER — Other Ambulatory Visit: Payer: Self-pay

## 2016-04-02 DIAGNOSIS — C50812 Malignant neoplasm of overlapping sites of left female breast: Principal | ICD-10-CM

## 2016-04-02 DIAGNOSIS — Z171 Estrogen receptor negative status [ER-]: Principal | ICD-10-CM

## 2016-04-02 DIAGNOSIS — C50011 Malignant neoplasm of nipple and areola, right female breast: Secondary | ICD-10-CM

## 2016-04-02 DIAGNOSIS — C50012 Malignant neoplasm of nipple and areola, left female breast: Secondary | ICD-10-CM

## 2016-04-02 DIAGNOSIS — C50811 Malignant neoplasm of overlapping sites of right female breast: Secondary | ICD-10-CM

## 2016-04-02 NOTE — Assessment & Plan Note (Signed)
Treatment Summary: 1. Neoadjuvant chemotherapy with either AC followed by THP started 09/13/2015 and completed 12 weeks of Taxol 11/29/2015, Herceptin Perjeta 6 2. Followed by bilateral mastectomies and lymph node surgery 02/11/16 3. Followed by adjuvant radiation 4. Followed by adjuvant antiestrogen therapy  Bilateral Mastectomies 02/11/16 Right mastectomy: IDC 2 nodules, 2.5, 1 cm, margins negative, 3/7 lymph nodes positive T2 N1a stage II B, ER 95%, PR 2%, HER-2 negative, Ki-67 15% RCB burden 3.1 and 2.9 class II  Left mastectomy: IDC 3.1 cm with DCIS, margins negative, 0/8 lymph nodes, T2 N0 stage II a, RCB burden 2.18, class II, ER 100%, PR 95%, HER-2 negative ratio 1.2, Ki-67 30%  Chemotherapy-induced peripheral neuropathy: Grade 1-2, prescribed Neurontin. We'll monitor this very closely.  Return to clinic every 3weeks for Herceptin and Perjeta Adj XRT foll by Adj Anti estrogen therapy

## 2016-04-03 ENCOUNTER — Other Ambulatory Visit: Payer: Self-pay

## 2016-04-03 ENCOUNTER — Encounter: Payer: Self-pay | Admitting: Hematology and Oncology

## 2016-04-03 ENCOUNTER — Ambulatory Visit (HOSPITAL_BASED_OUTPATIENT_CLINIC_OR_DEPARTMENT_OTHER): Payer: Self-pay

## 2016-04-03 ENCOUNTER — Other Ambulatory Visit: Payer: Self-pay | Admitting: Hematology and Oncology

## 2016-04-03 ENCOUNTER — Ambulatory Visit: Admission: RE | Admit: 2016-04-03 | Payer: Self-pay | Source: Ambulatory Visit | Admitting: Radiation Oncology

## 2016-04-03 ENCOUNTER — Ambulatory Visit (HOSPITAL_BASED_OUTPATIENT_CLINIC_OR_DEPARTMENT_OTHER): Payer: Self-pay | Admitting: Hematology and Oncology

## 2016-04-03 ENCOUNTER — Encounter: Payer: Self-pay | Admitting: *Deleted

## 2016-04-03 ENCOUNTER — Ambulatory Visit: Payer: Self-pay

## 2016-04-03 ENCOUNTER — Other Ambulatory Visit (HOSPITAL_BASED_OUTPATIENT_CLINIC_OR_DEPARTMENT_OTHER): Payer: Self-pay

## 2016-04-03 ENCOUNTER — Ambulatory Visit
Admission: RE | Admit: 2016-04-03 | Discharge: 2016-04-03 | Disposition: A | Discharge status: Home / Self Care | Payer: Self-pay | Source: Ambulatory Visit | Attending: Radiation Oncology | Admitting: Radiation Oncology

## 2016-04-03 VITALS — BP 168/77 | HR 59 | Temp 97.5°F | Resp 17 | Ht 59.0 in | Wt 214.1 lb

## 2016-04-03 DIAGNOSIS — Z171 Estrogen receptor negative status [ER-]: Principal | ICD-10-CM

## 2016-04-03 DIAGNOSIS — C50812 Malignant neoplasm of overlapping sites of left female breast: Secondary | ICD-10-CM

## 2016-04-03 DIAGNOSIS — C50312 Malignant neoplasm of lower-inner quadrant of left female breast: Secondary | ICD-10-CM

## 2016-04-03 DIAGNOSIS — C50011 Malignant neoplasm of nipple and areola, right female breast: Secondary | ICD-10-CM

## 2016-04-03 DIAGNOSIS — T451X5A Adverse effect of antineoplastic and immunosuppressive drugs, initial encounter: Principal | ICD-10-CM

## 2016-04-03 DIAGNOSIS — C773 Secondary and unspecified malignant neoplasm of axilla and upper limb lymph nodes: Secondary | ICD-10-CM

## 2016-04-03 DIAGNOSIS — C50012 Malignant neoplasm of nipple and areola, left female breast: Secondary | ICD-10-CM

## 2016-04-03 DIAGNOSIS — C50211 Malignant neoplasm of upper-inner quadrant of right female breast: Secondary | ICD-10-CM

## 2016-04-03 DIAGNOSIS — C50411 Malignant neoplasm of upper-outer quadrant of right female breast: Secondary | ICD-10-CM

## 2016-04-03 DIAGNOSIS — C50811 Malignant neoplasm of overlapping sites of right female breast: Secondary | ICD-10-CM

## 2016-04-03 DIAGNOSIS — Z5112 Encounter for antineoplastic immunotherapy: Secondary | ICD-10-CM

## 2016-04-03 DIAGNOSIS — G620 Drug-induced polyneuropathy: Secondary | ICD-10-CM

## 2016-04-03 LAB — COMPREHENSIVE METABOLIC PANEL
ALT: 32 U/L (ref 0–55)
AST: 30 U/L (ref 5–34)
Albumin: 3.8 g/dL (ref 3.5–5.0)
Alkaline Phosphatase: 86 U/L (ref 40–150)
Anion Gap: 8 mEq/L (ref 3–11)
BUN: 14.7 mg/dL (ref 7.0–26.0)
CO2: 24 meq/L (ref 22–29)
Calcium: 9.3 mg/dL (ref 8.4–10.4)
Chloride: 107 mEq/L (ref 98–109)
Creatinine: 0.7 mg/dL (ref 0.6–1.1)
GLUCOSE: 105 mg/dL (ref 70–140)
POTASSIUM: 3.9 meq/L (ref 3.5–5.1)
SODIUM: 139 meq/L (ref 136–145)
Total Bilirubin: 0.59 mg/dL (ref 0.20–1.20)
Total Protein: 7.3 g/dL (ref 6.4–8.3)

## 2016-04-03 LAB — CBC WITH DIFFERENTIAL/PLATELET
BASO%: 0.3 % (ref 0.0–2.0)
BASOS ABS: 0 10*3/uL (ref 0.0–0.1)
EOS%: 1 % (ref 0.0–7.0)
Eosinophils Absolute: 0.1 10*3/uL (ref 0.0–0.5)
HCT: 40.2 % (ref 34.8–46.6)
HGB: 13.5 g/dL (ref 11.6–15.9)
LYMPH%: 35.4 % (ref 14.0–49.7)
MCH: 28 pg (ref 25.1–34.0)
MCHC: 33.6 g/dL (ref 31.5–36.0)
MCV: 83.2 fL (ref 79.5–101.0)
MONO#: 0.5 10*3/uL (ref 0.1–0.9)
MONO%: 7.3 % (ref 0.0–14.0)
NEUT#: 3.5 10*3/uL (ref 1.5–6.5)
NEUT%: 56 % (ref 38.4–76.8)
Platelets: 189 10*3/uL (ref 145–400)
RBC: 4.83 10*6/uL (ref 3.70–5.45)
RDW: 12.6 % (ref 11.2–14.5)
WBC: 6.3 10*3/uL (ref 3.9–10.3)
lymph#: 2.2 10*3/uL (ref 0.9–3.3)
nRBC: 0 % (ref 0–0)

## 2016-04-03 MED ORDER — SODIUM CHLORIDE 0.9 % IV SOLN
Freq: Once | INTRAVENOUS | Status: AC
  Administered 2016-04-03: 13:00:00 via INTRAVENOUS

## 2016-04-03 MED ORDER — DIPHENHYDRAMINE HCL 25 MG PO CAPS
50.0000 mg | ORAL_CAPSULE | Freq: Once | ORAL | Status: AC
  Administered 2016-04-03: 50 mg via ORAL

## 2016-04-03 MED ORDER — HEPARIN SOD (PORK) LOCK FLUSH 100 UNIT/ML IV SOLN
500.0000 [IU] | Freq: Once | INTRAVENOUS | Status: AC | PRN
  Administered 2016-04-03: 500 [IU]
  Filled 2016-04-03: qty 5

## 2016-04-03 MED ORDER — ACETAMINOPHEN 325 MG PO TABS
ORAL_TABLET | ORAL | Status: AC
  Filled 2016-04-03: qty 2

## 2016-04-03 MED ORDER — ALRA NON-METALLIC DEODORANT (RAD-ONC)
1.0000 "application " | Freq: Once | TOPICAL | Status: AC
  Administered 2016-04-03: 1 via TOPICAL

## 2016-04-03 MED ORDER — SODIUM CHLORIDE 0.9% FLUSH
10.0000 mL | INTRAVENOUS | Status: DC | PRN
  Administered 2016-04-03: 10 mL
  Filled 2016-04-03: qty 10

## 2016-04-03 MED ORDER — GABAPENTIN 100 MG PO CAPS: ORAL_CAPSULE | ORAL | 3 refills | Status: DC

## 2016-04-03 MED ORDER — TRASTUZUMAB CHEMO 150 MG IV SOLR
6.0000 mg/kg | Freq: Once | INTRAVENOUS | Status: AC
  Administered 2016-04-03: 588 mg via INTRAVENOUS
  Filled 2016-04-03: qty 28

## 2016-04-03 MED ORDER — DIPHENHYDRAMINE HCL 25 MG PO CAPS
ORAL_CAPSULE | ORAL | Status: AC
  Filled 2016-04-03: qty 2

## 2016-04-03 MED ORDER — ACETAMINOPHEN 325 MG PO TABS
650.0000 mg | ORAL_TABLET | Freq: Once | ORAL | Status: AC
  Administered 2016-04-03: 650 mg via ORAL

## 2016-04-03 MED ORDER — RADIAPLEXRX EX GEL
Freq: Once | CUTANEOUS | Status: AC
  Administered 2016-04-03: 10:00:00 via TOPICAL

## 2016-04-03 MED ORDER — SODIUM CHLORIDE 0.9% FLUSH
10.0000 mL | INTRAVENOUS | Status: DC | PRN
  Administered 2016-04-03: 10 mL via INTRAVENOUS
  Filled 2016-04-03: qty 10

## 2016-04-03 NOTE — Progress Notes (Signed)
Patient Care Team: No Pcp Per Patient as PCP - General (General Practice)  DIAGNOSIS:  Encounter Diagnosis  Name Primary?  . 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, unspecified estrogen receptor status ([place])     SUMMARY OF ONCOLOGIC HISTORY:   Bilateral breast cancer ([place])   08/16/2015 Mammogram    Left breast LIQ 4.2 cm, 4 mm oval mass 12:00, 1.2 cm mass left UOQ, enlarged left axillary lymph node      08/16/2015 Mammogram    Right breast UIQ: 3.9 cm irregular mass, 1.5 cm mass in the right LOQ, 9 mm mass right UOQ, 6 mm mass right LIQ, enlarged lymph node Rt axilla      08/20/2015 Initial Diagnosis    Right breast biopsy 1:00: IDC grade 2 ER 95%, PR 2%, Ki 67 15%; HER-2 negative ratio 1.04; 10:00: IDC grade 2-3 dissimilar to 1:00 biopsy 10:00: ER 95%, PR 0%, K67: 25%, HER-2 positive ratio 6.42 right axillary lymph node positive for metastatic cancer      08/20/2015 Procedure    Left biopsy 8:00: IDC with DCIS, grade 2; ER 95%, PR 95%, Ki-67 30%, HER-2 negative ratio 1.2; 12:00: IDC with DCIS, grade 1 ER 95%, PR 95%, Ki-67 10% HER-2 negative ratio 1.14 not similar to this biopsy, left axillary lymph node positive,       09/13/2015 - 01/17/2016 Neo-Adjuvant Chemotherapy    Neoadjuvant dose dense Adriamycin and Cytoxan followed by Taxol Herceptin Perjeta, followed by Herceptin maintenance      01/21/2016 Breast MRI    Bilateral breast masses significantly decreased in size with mild residual NME, complete resolution of numerous other nodules in both breasts, bilateral axillary lymph nodes and bilateral retropectoral lymph nodes decreased in size      02/11/2016 Surgery    Right mastectomy: IDC 2 nodules, 2.5, 1 cm, margins negative, 3/7 lymph nodes positive T2 N1a stage II B, ER 95%, PR 2%, HER-2 negative, Ki-67 15% RCB burden 3.1 and 2.9 class II      02/11/2016 Surgery    Left mastectomy: IDC 3.1 cm with DCIS, margins negative, 0/8 lymph nodes, T2 N0 stage II a, RCB  burden 2.18, class II, ER 100%, PR 95%, HER-2 negative ratio 1.2, Ki-67 30%        CHIEF COMPLIANT: Follow-up on Herceptin maintenance after bilateral mastectomies  INTERVAL HISTORY: Felicia [name] is a 55 year old with above-mentioned history of bilateral breast cancers treated with neoadjuvant chemotherapy with dose dense Adriamycin and Cytoxan followed by Taxol Herceptin and Perjeta. She is here today to receive Herceptin maintenance. She underwent bilateral mastectomies still showed significant residual breast cancer. Right-sided she had 3 out of 7 lymph nodes were positive on the left side nodes were negative. It appears that the chemotherapy was able to make the left axilla negative because previously it was positive. She is recovering well from the surgery. She is currently on Herceptin maintenance.  REVIEW OF SYSTEMS:   Constitutional: Denies fevers, chills or abnormal weight loss Eyes: Denies blurriness of vision Ears, nose, mouth, throat, and face: Denies mucositis or sore throat Respiratory: Denies cough, dyspnea or wheezes Cardiovascular: Denies palpitation, chest discomfort Gastrointestinal:  Denies nausea, heartburn or change in bowel habits Skin: Denies abnormal skin rashes Lymphatics: Denies new lymphadenopathy or easy bruising Neurological:Denies numbness, tingling or new weaknesses Behavioral/Psych: Mood is stable, no new changes  Extremities: No lower extremity edema Breast:  Bilateral mastectomies All other systems were reviewed with the patient and are negative.  I have reviewed  the past medical history, past surgical history, social history and family history with the patient and they are unchanged from previous note.  ALLERGIES:  is allergic to no known allergies.  MEDICATIONS:  Current Outpatient Prescriptions  Medication Sig Dispense Refill  . gabapentin (NEURONTIN) 100 MG capsule Take 1 capsule ('100mg'$ ) by mouth three times per day for 1 week.  Increase to 2  capsules ('200mg'$ ) by mouth three times per day for 1 week. Then increase to 3 capsules ('300mg'$ ) by mouth three times per day. 120 capsule 3  . HYDROcodone-acetaminophen (NORCO) 10-325 MG tablet Take 1 tablet by mouth every 6 (six) hours as needed. (Patient not taking: Reported on 03/05/2016) 10 tablet 0  . HYDROcodone-acetaminophen (NORCO/VICODIN) 5-325 MG tablet Take 1-2 tablets by mouth every 4 (four) hours as needed for moderate pain. (Patient not taking: Reported on 03/05/2016) 20 tablet 0  . lidocaine-prilocaine (EMLA) cream Apply 1 application topically as needed (Apply 2- hours prior to access of port).    . LORazepam (ATIVAN) 0.5 MG tablet Take 0.5 mg by mouth every 6 (six) hours as needed for anxiety.    . magic mouthwash w/lidocaine SOLN Take 5 mLs by mouth 3 (three) times daily as needed for mouth pain. (Patient not taking: Reported on 03/05/2016) 100 mL 0  . naproxen (NAPROSYN) 500 MG tablet Take 500 mg by mouth 2 (two) times daily as needed (for pain.).     Marland Kitchen omeprazole (PRILOSEC) 20 MG capsule Take 1 capsule (20 mg total) by mouth daily. (Patient not taking: Reported on 03/05/2016) 30 capsule 0  . potassium chloride SA (K-DUR,KLOR-CON) 20 MEQ tablet TAKE 1 TABLET BY MOUTH ONCE DAILY 30 tablet 0  . prochlorperazine (COMPAZINE) 10 MG tablet Take 10 mg by mouth every 6 (six) hours as needed for nausea or vomiting.     Current Facility-Administered Medications  Medication Dose Route Frequency Provider Last Rate Last Dose  . sodium chloride flush (NS) 0.9 % injection 10 mL  10 mL Intravenous PRN Nicholas Lose, MD   10 mL at 04/03/16 1133    PHYSICAL EXAMINATION: ECOG PERFORMANCE STATUS: 1 - Symptomatic but completely ambulatory  Vitals:   04/03/16 1115  BP: (!) 168/77  Pulse: (!) 59  Resp: 17  Temp: 97.5 F (36.4 C)   Filed Weights   04/03/16 1115  Weight: 214 lb 1.6 oz (97.1 kg)    GENERAL:alert, no distress and comfortable SKIN: skin color, texture, turgor are normal, no rashes  or significant lesions EYES: normal, Conjunctiva are pink and non-injected, sclera clear OROPHARYNX:no exudate, no erythema and lips, buccal mucosa, and tongue normal  NECK: supple, thyroid normal size, non-tender, without nodularity LYMPH:  no palpable lymphadenopathy in the cervical, axillary or inguinal LUNGS: clear to auscultation and percussion with normal breathing effort HEART: regular rate & rhythm and no murmurs and no lower extremity edema ABDOMEN:abdomen soft, non-tender and normal bowel sounds MUSCULOSKELETAL:no cyanosis of digits and no clubbing  NEURO: alert & oriented x 3 with fluent speech, no focal motor/sensory deficits EXTREMITIES: No lower extremity edema  LABORATORY DATA:  I have reviewed the data as listed   Chemistry      Component Value Date/Time   NA 139 04/03/2016 1044   K 3.9 04/03/2016 1044   CL 107 02/12/2016 0606   CO2 24 04/03/2016 1044   BUN 14.7 04/03/2016 1044   CREATININE 0.7 04/03/2016 1044      Component Value Date/Time   CALCIUM 9.3 04/03/2016 1044   ALKPHOS 86  04/03/2016 1044   AST 30 04/03/2016 1044   ALT 32 04/03/2016 1044   BILITOT 0.59 04/03/2016 1044       Lab Results  Component Value Date   WBC 6.3 04/03/2016   HGB 13.5 04/03/2016   HCT 40.2 04/03/2016   MCV 83.2 04/03/2016   PLT 189 04/03/2016   NEUTROABS 3.5 04/03/2016    ASSESSMENT & PLAN:  Bilateral breast cancer ([place]) Treatment Summary: 1. Neoadjuvant chemotherapy with either AC followed by THP started 09/13/2015 and completed 12 weeks of Taxol 11/29/2015, Herceptin Perjeta 6; now on Herceptin  2. Followed by bilateral mastectomies and lymph node surgery 02/11/16 3. Followed by adjuvant radiation 4. Followed by adjuvant antiestrogen therapy 5. 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 We'll monitor this very closely.  Return to clinic every 3weeks for Herceptin Adj XRT foll by Adj Anti estrogen therapy  Return to clinic to see me every 6 weeks and every 3 weeks for Herceptin No orders of the defined types were placed in this encounter.  The patient has a good understanding of the overall plan. she agrees with it. she will call with any problems that may develop before the next visit here.   Rulon Eisenmenger, MD 04/03/16

## 2016-04-03 NOTE — Progress Notes (Addendum)
Skin check,  Still drains a little, but scab is smaller open area, still will not have tyx today will start on Monday, went and gave alra, radiaplex gel and radiation book to patient and daughter, will start using radiaplex on Monday after tx and alra after tx Monday, discussed ways  To manage fatigue, skin irritation, apply radiaplex daily after rad tx and bedtime,luke warm shower or bath, she doens't shave, no underwire bras,  Increase protein and fluids, water in diet,  Sees MD and staff RN weekly and prn, dove unscented preferred, daughter translated for mother, also daughter can read [place], verbal read bak, teach back given, called [facility]3 patient to start radiation treatment on Monday 10:16 AM

## 2016-04-03 NOTE — Patient Instructions (Signed)
[facility] Discharge Instructions for Patients Receiving Chemotherapy  Today you received the following chemotherapy agents: Herceptin   To help prevent nausea and vomiting after your treatment, we encourage you to take your nausea medication as directed.    If you develop nausea and vomiting that is not controlled by your nausea medication, call the clinic.   BELOW ARE SYMPTOMS THAT SHOULD BE REPORTED IMMEDIATELY:  *FEVER GREATER THAN 100.5 F  *CHILLS WITH OR WITHOUT FEVER  NAUSEA AND VOMITING THAT IS NOT CONTROLLED WITH YOUR NAUSEA MEDICATION  *UNUSUAL SHORTNESS OF BREATH  *UNUSUAL BRUISING OR BLEEDING  TENDERNESS IN MOUTH AND THROAT WITH OR WITHOUT PRESENCE OF ULCERS  *URINARY PROBLEMS  *BOWEL PROBLEMS  UNUSUAL RASH Items with * indicate a potential emergency and should be followed up as soon as possible.  Feel free to call the clinic you have any questions or concerns. The clinic phone number is [phone].  Please show the CHEMO ALERT CARD at check-in to the Emergency Department and triage nurse.   

## 2016-04-03 NOTE — Progress Notes (Signed)
Pt pac accessed w/o difficulty. Labs drawn from port. Pt states that since her surgery, her port had been more sensitive and painful to touch. Checked placement tip at upper svc. Good blood return and flushes without difficulty. Denies any pain or symptoms when port flushed. R upper chest above port incision appears swollen, but wire is felt when touched and not fluid. Pt states that she feels that its sore and tender when touching or laying on her R side. Pt also stated that she had port repositioned during her surgery. Told pt that her placement is in good location, and had no trouble accessing port, and revealed great blood return and flushes smoothly. Notified nurse in infusion and is aware. Dr.Gudena aware.

## 2016-04-04 ENCOUNTER — Other Ambulatory Visit: Payer: Self-pay | Admitting: General Surgery

## 2016-04-04 ENCOUNTER — Ambulatory Visit
Admission: RE | Admit: 2016-04-04 | Discharge: 2016-04-04 | Disposition: A | Discharge status: Home / Self Care | Payer: Self-pay | Source: Ambulatory Visit | Attending: Radiation Oncology | Admitting: Radiation Oncology

## 2016-04-04 ENCOUNTER — Ambulatory Visit: Payer: Self-pay

## 2016-04-04 DIAGNOSIS — C50812 Malignant neoplasm of overlapping sites of left female breast: Principal | ICD-10-CM

## 2016-04-04 DIAGNOSIS — Z95828 Presence of other vascular implants and grafts: Secondary | ICD-10-CM

## 2016-04-04 DIAGNOSIS — C50811 Malignant neoplasm of overlapping sites of right female breast: Secondary | ICD-10-CM

## 2016-04-04 DIAGNOSIS — Z171 Estrogen receptor negative status [ER-]: Principal | ICD-10-CM

## 2016-04-04 NOTE — Progress Notes (Deleted)
Department of Radiation Oncology  Phone:  [phone] Fax:        [phone]  Weekly Treatment Note    Name: Felicia [name] Date: 04/04/2016 MRN: [id number] DOB: [birth year]   Diagnosis:     ICD-9-CM ICD-10-CM   1. Malignant neoplasm of overlapping sites of both breasts in female, estrogen receptor negative ([facility]) 174.8 C50.811    V86.1 C50.812     Z17.1      Current dose: 56.6 Gy  Current fraction: 31   MEDICATIONS: Current Outpatient Prescriptions  Medication Sig Dispense Refill  . gabapentin (NEURONTIN) 100 MG capsule Take 1 capsule 100mg  once at bedtime daily 30 capsule 3  . HYDROcodone-acetaminophen (NORCO) 10-325 MG tablet Take 1 tablet by mouth every 6 (six) hours as needed. (Patient not taking: Reported on 03/05/2016) 10 tablet 0  . HYDROcodone-acetaminophen (NORCO/VICODIN) 5-325 MG tablet Take 1-2 tablets by mouth every 4 (four) hours as needed for moderate pain. (Patient not taking: Reported on 03/05/2016) 20 tablet 0  . lidocaine-prilocaine (EMLA) cream Apply 1 application topically as needed (Apply 2- hours prior to access of port).    . LORazepam (ATIVAN) 0.5 MG tablet Take 0.5 mg by mouth every 6 (six) hours as needed for anxiety.    . magic mouthwash w/lidocaine SOLN Take 5 mLs by mouth 3 (three) times daily as needed for mouth pain. (Patient not taking: Reported on 03/05/2016) 100 mL 0  . naproxen (NAPROSYN) 500 MG tablet Take 500 mg by mouth 2 (two) times daily as needed (for pain.).     Marland Kitchen omeprazole (PRILOSEC) 20 MG capsule Take 1 capsule (20 mg total) by mouth daily. (Patient not taking: Reported on 03/05/2016) 30 capsule 0  . potassium chloride SA (K-DUR,KLOR-CON) 20 MEQ tablet TAKE 1 TABLET BY MOUTH ONCE DAILY 30 tablet 0  . prochlorperazine (COMPAZINE) 10 MG tablet Take 10 mg by mouth every 6 (six) hours as needed for nausea or vomiting.     No current facility-administered medications for this encounter.      ALLERGIES: No known  allergies   LABORATORY DATA:  Lab Results  Component Value Date   WBC 6.3 04/03/2016   HGB 13.5 04/03/2016   HCT 40.2 04/03/2016   MCV 83.2 04/03/2016   PLT 189 04/03/2016   Lab Results  Component Value Date   NA 139 04/03/2016   K 3.9 04/03/2016   CL 107 02/12/2016   CO2 24 04/03/2016   Lab Results  Component Value Date   ALT 32 04/03/2016   AST 30 04/03/2016   ALKPHOS 86 04/03/2016   BILITOT 0.59 04/03/2016     NARRATIVE: Felicia [name] was seen today for weekly treatment management. The chart was checked and the patient's films were reviewed.  Skin check,  Still drains a little, but scab is smaller open area, still will not have tyx today will start on Monday, went and gave alra, radiaplex gel and radiation book to patient and daughter, will start using radiaplex on Monday after tx and alra after tx Monday, discussed ways  To manage fatigue, skin irritation, apply radiaplex daily after rad tx and bedtime,luke warm shower or bath, she doens't shave, no underwire bras,  Increase protein and fluids, water in diet,  Sees MD and staff RN weekly and prn, dove unscented preferred, daughter translated for mother, also daughter can read [place], verbal read bak, teach back given, called [facility]3 patient to start radiation treatment on Monday 10:34 AM   PHYSICAL EXAMINATION: vitals were not taken for  this visit.     Diffuse dry desquamation with pronounced hyperpigmentation in the treatment area. The supraclavicular region looks very good. No moist desquamation.  ASSESSMENT: The patient is doing satisfactorily with treatment.  PLAN: We will continue with the patient's radiation treatment as planned. The patient will continue her current skin care and then will begin using vitamin E oil you'll after she runs out of what she is currently using. She will continue with this until she is seen for follow-up in one month.

## 2016-04-07 ENCOUNTER — Ambulatory Visit: Payer: Self-pay

## 2016-04-07 ENCOUNTER — Ambulatory Visit
Admission: RE | Admit: 2016-04-07 | Discharge: 2016-04-07 | Disposition: A | Discharge status: Home / Self Care | Payer: Self-pay | Source: Ambulatory Visit | Attending: Radiation Oncology | Admitting: Radiation Oncology

## 2016-04-07 ENCOUNTER — Ambulatory Visit (HOSPITAL_COMMUNITY)
Admission: RE | Admit: 2016-04-07 | Discharge: 2016-04-07 | Disposition: A | Discharge status: Home / Self Care | Payer: Self-pay | Source: Ambulatory Visit | Attending: General Surgery | Admitting: General Surgery

## 2016-04-07 DIAGNOSIS — Z048 Encounter for examination and observation for other specified reasons: Secondary | ICD-10-CM | POA: Insufficient documentation

## 2016-04-07 DIAGNOSIS — Z95828 Presence of other vascular implants and grafts: Secondary | ICD-10-CM

## 2016-04-07 DIAGNOSIS — C50919 Malignant neoplasm of unspecified site of unspecified female breast: Secondary | ICD-10-CM | POA: Insufficient documentation

## 2016-04-08 ENCOUNTER — Encounter: Payer: Self-pay | Admitting: Hematology and Oncology

## 2016-04-08 ENCOUNTER — Ambulatory Visit
Admission: RE | Admit: 2016-04-08 | Discharge: 2016-04-08 | Disposition: A | Discharge status: Home / Self Care | Payer: Self-pay | Source: Ambulatory Visit | Attending: Radiation Oncology | Admitting: Radiation Oncology

## 2016-04-08 NOTE — Progress Notes (Signed)
Received approval for Herceptin through [facility]. Copy will be given to [facility] in [place] with free drug request form.

## 2016-04-09 ENCOUNTER — Ambulatory Visit
Admission: RE | Admit: 2016-04-09 | Discharge: 2016-04-09 | Disposition: A | Discharge status: Home / Self Care | Payer: Self-pay | Source: Ambulatory Visit | Attending: Radiation Oncology | Admitting: Radiation Oncology

## 2016-04-10 ENCOUNTER — Ambulatory Visit
Admission: RE | Admit: 2016-04-10 | Discharge: 2016-04-10 | Disposition: A | Discharge status: Home / Self Care | Payer: Self-pay | Source: Ambulatory Visit | Attending: Radiation Oncology | Admitting: Radiation Oncology

## 2016-04-10 ENCOUNTER — Encounter: Payer: Self-pay | Admitting: Radiation Oncology

## 2016-04-10 VITALS — BP 165/111 | HR 58 | Temp 97.4°F | Resp 20 | Wt 215.8 lb

## 2016-04-10 DIAGNOSIS — Z171 Estrogen receptor negative status [ER-]: Principal | ICD-10-CM

## 2016-04-10 DIAGNOSIS — C50812 Malignant neoplasm of overlapping sites of left female breast: Principal | ICD-10-CM

## 2016-04-10 DIAGNOSIS — C50811 Malignant neoplasm of overlapping sites of right female breast: Secondary | ICD-10-CM

## 2016-04-10 NOTE — Addendum Note (Signed)
Encounter addended by: Kyung Rudd, MD on: 04/10/2016 11:24 AM<BR>    Actions taken: Sign clinical note

## 2016-04-10 NOTE — Progress Notes (Signed)
Department of Radiation Oncology  Phone:  [phone] Fax:        [phone]  Weekly Treatment Note    Name: Felicia [name] Date: 04/10/2016 MRN: [id number] DOB: [birth year]   Diagnosis:     ICD-9-CM ICD-10-CM   1. 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 gabapentin (NEURONTIN) 100 MG capsule Take 1 capsule 100mg  once at bedtime daily 30 capsule 3  . hyaluronate sodium (RADIAPLEXRX) GEL Apply 1 application topically 2 (two) times daily.    Marland Kitchen LORazepam (ATIVAN) 0.5 MG tablet Take 0.5 mg by mouth every 6 (six) hours as needed for anxiety.    . naproxen (NAPROSYN) 500 MG tablet Take 500 mg by mouth 2 (two) times daily as needed (for pain.).     Marland Kitchen non-metallic deodorant (ALRA) MISC Apply 1 application topically daily as needed.    . potassium chloride SA (K-DUR,KLOR-CON) 20 MEQ tablet TAKE 1 TABLET BY MOUTH ONCE DAILY 30 tablet 0  . prochlorperazine (COMPAZINE) 10 MG tablet Take 10 mg by mouth every 6 (six) hours as needed for nausea or vomiting.    Marland Kitchen HYDROcodone-acetaminophen (NORCO) 10-325 MG tablet Take 1 tablet by mouth every 6 (six) hours as needed. (Patient not taking: Reported on 04/10/2016) 10 tablet 0  . HYDROcodone-acetaminophen (NORCO/VICODIN) 5-325 MG tablet Take 1-2 tablets by mouth every 4 (four) hours as needed for moderate pain. (Patient not taking: Reported on 04/10/2016) 20 tablet 0  . lidocaine-prilocaine (EMLA) cream Apply 1 application topically as needed (Apply 2- hours prior to access of port).    . magic mouthwash w/lidocaine SOLN Take 5 mLs by mouth 3 (three) times daily as needed for mouth pain. (Patient not taking: Reported on 04/10/2016) 100 mL 0  . omeprazole (PRILOSEC) 20 MG capsule Take 1 capsule (20 mg total) by mouth  daily. (Patient not taking: Reported on 04/10/2016) 30 capsule 0   No current facility-administered medications for this encounter.      ALLERGIES: No known allergies   LABORATORY DATA:  Lab Results  Component Value Date   WBC 6.3 04/03/2016   HGB 13.5 04/03/2016   HCT 40.2 04/03/2016   MCV 83.2 04/03/2016   PLT 189 04/03/2016   Lab Results  Component Value Date   NA 139 04/03/2016   K 3.9 04/03/2016   CL 107 02/12/2016   CO2 24 04/03/2016   Lab Results  Component Value Date   ALT 32 04/03/2016   AST 30 04/03/2016   ALKPHOS 86 04/03/2016   BILITOT 0.59 04/03/2016     NARRATIVE: Felicia [name] was seen today for weekly treatment management. The chart was checked and the patient's films were reviewed.  She returns for weekly radiation treatment. She is accompanied by interpreter, Laural Golden, who facilitated our visit today. She has no complaints of pain or headache. She has no skin changes. Using radiaplex bid. Appetite is good. She just completed antibiotics for a tooth infection and inflammation. This does still hurt. She reports mild fatigue. She does not take blood pressure medication and has not had a problem with blood pressure in the past. She feels okay today. Since her surgery, her blood pressure has been taken from her leg. When her blood pressure was taken by her arm,  she never had an issue.   PHYSICAL EXAMINATION: weight is 215 lb 12.8 oz (97.9 kg). Her oral temperature is 97.4 F (36.3 C). Her blood pressure is 165/111 (abnormal) and her pulse is 58 (abnormal). Her respiration is 20.   Vitals taken from right ankle. Elevated BP noted. Alert and in no acute distress.   ASSESSMENT: The patient is doing satisfactorily with treatment. Her elevated blood pressure may be related to difference in technique since blood pressure is not being done on her arm due to recent bilateral surgery.   PLAN: We will continue with the patient's radiation treatment as planned.  Encouraged the patient to follow with primary care for elevated blood pressure.    ------------------------------------------------  Jodelle Gross, MD, PhD  This document serves as a record of services personally performed by Kyung Rudd, MD. It was created on his behalf by Arlyce Harman, a trained medical scribe. The creation of this record is based on the scribe's personal observations and the provider's statements to them. This document has been checked and approved by the attending provider.

## 2016-04-10 NOTE — Progress Notes (Signed)
  Radiation Oncology         [phone] ________________________________  Name: Felicia [name] MRN: [id number]  Date: 03/10/2016  DOB: 03-24-1961  SIMULATION AND TREATMENT PLANNING NOTE  DIAGNOSIS:     ICD-9-CM ICD-10-CM   1. Malignant neoplasm of overlapping sites of both breasts in female, estrogen receptor negative ([place]) 174.8 C50.811    V86.1 C50.812     Z17.1      Site:  Bilateral chest wall/ SCLV  NARRATIVE:  The patient was brought to the [facility].  Identity was confirmed.  All relevant records and images related to the planned course of therapy were reviewed.   Written consent to proceed with treatment was confirmed which was freely given after reviewing the details related to the planned course of therapy had been reviewed with the patient.  Then, the patient was set-up in a stable reproducible  supine position for radiation therapy.  CT images were obtained.  Surface markings were placed.    Medically necessary complex treatment device(s) for immobilization:  Vac-lock bag.   The CT images were loaded into the planning software.  Then the target and avoidance structures were contoured.  Treatment planning then occurred.  The radiation prescription was entered and confirmed.  A total of 8 complex treatment devices were fabricated which relate to the designed radiation treatment fields. A reduced field technique will also be evaluated to determine if this approach improves dose homogeneity. Each of these customized fields/ complex treatment devices will be used on a daily basis during the radiation course. I have requested : 3D Simulation  I have requested a DVH of the following structures: target volume, heart, lungs.   PLAN:  The patient will receive 50.4 Gy in 28 fractions initially to the chest wall and SCLV regions bilaterally. She will then receive a 10 Gy boost for a final dose of 60.4 Gy.  ________________________________   Jodelle Gross, MD, PhD

## 2016-04-10 NOTE — Progress Notes (Signed)
  Radiation Oncology         [phone] ________________________________  Name: Felicia [name] MRN: [id number]  Date: 03/10/2016  DOB: 12/30/1960  Optical Surface Tracking Plan:  Since intensity modulated radiotherapy (IMRT) and 3D conformal radiation treatment methods are predicated on accurate and precise positioning for treatment, intrafraction motion monitoring is medically necessary to ensure accurate and safe treatment delivery.  The ability to quantify intrafraction motion without excessive ionizing radiation dose can only be performed with optical surface tracking. Accordingly, surface imaging offers the opportunity to obtain 3D measurements of patient position throughout IMRT and 3D treatments without excessive radiation exposure.  I am ordering optical surface tracking for this patient's upcoming course of radiotherapy. ________________________________  Kyung Rudd, MD 04/10/2016 11:24 AM    Reference:   Ursula Alert, J, et al. Surface imaging-based analysis of intrafraction motion for breast radiotherapy patients.Journal of [place], n. 6, nov. 2014. ISSN [id number].   Available at: <http://www.jacmp.org/index.php/jacmp/article/view/4957>.

## 2016-04-10 NOTE — Progress Notes (Addendum)
weekly rad txs B/AL breast/chest wall 4/33 completed, took vitals right ankle high BP (!) 197/109 (BP Location: Right Leg, Patient Position: Sitting, Cuff Size: Normal) Comment: right ankle  Pulse 62   Temp 97.8 F (36.6 C) (Oral)   Resp 16   Wt 215 lb 12.8 oz (97.9 kg)   BMI 43.59 kg/m   Retook right wrist vitals= BP (!) 165/111 (BP Location: Right Wrist, Patient Position: Sitting, Cuff Size: Small) Comment: right wrist  Pulse (!) 58   Temp 97.4 F (36.3 C) (Oral)   Resp 20   Wt 215 lb 12.8 oz (97.9 kg)   BMI 43.59 kg/m   No c/o pain , or headache,  No skin changes  Using radiaplex bid, appetite good, just completed antibiotics for tooth  Infection and inflamation,still hurts a little stated, interpreter Laural Golden with patient and family Mild fatigue 10:17 AM Wt Readings from Last 3 Encounters:  04/10/16 215 lb 12.8 oz (97.9 kg)  04/03/16 214 lb 1.6 oz (97.1 kg)  03/03/16 212 lb 12.8 oz (96.5 kg)

## 2016-04-11 ENCOUNTER — Ambulatory Visit: Payer: Self-pay | Admitting: Radiation Oncology

## 2016-04-11 ENCOUNTER — Ambulatory Visit
Admission: RE | Admit: 2016-04-11 | Discharge: 2016-04-11 | Disposition: A | Discharge status: Home / Self Care | Payer: Self-pay | Source: Ambulatory Visit | Attending: Radiation Oncology | Admitting: Radiation Oncology

## 2016-04-14 ENCOUNTER — Ambulatory Visit
Admission: RE | Admit: 2016-04-14 | Discharge: 2016-04-14 | Disposition: A | Discharge status: Home / Self Care | Payer: Self-pay | Source: Ambulatory Visit | Attending: Radiation Oncology | Admitting: Radiation Oncology

## 2016-04-14 ENCOUNTER — Ambulatory Visit: Payer: Self-pay

## 2016-04-15 ENCOUNTER — Ambulatory Visit
Admission: RE | Admit: 2016-04-15 | Discharge: 2016-04-15 | Disposition: A | Discharge status: Home / Self Care | Payer: Self-pay | Source: Ambulatory Visit | Attending: Radiation Oncology | Admitting: Radiation Oncology

## 2016-04-16 ENCOUNTER — Ambulatory Visit
Admission: RE | Admit: 2016-04-16 | Discharge: 2016-04-16 | Disposition: A | Discharge status: Home / Self Care | Payer: Self-pay | Source: Ambulatory Visit | Attending: Radiation Oncology | Admitting: Radiation Oncology

## 2016-04-16 ENCOUNTER — Other Ambulatory Visit: Payer: Self-pay

## 2016-04-16 DIAGNOSIS — C50011 Malignant neoplasm of nipple and areola, right female breast: Secondary | ICD-10-CM

## 2016-04-16 DIAGNOSIS — C50012 Malignant neoplasm of nipple and areola, left female breast: Principal | ICD-10-CM

## 2016-04-16 MED ORDER — VENLAFAXINE HCL 37.5 MG PO TABS: 37.5000 mg | ORAL_TABLET | Freq: Two times a day (BID) | ORAL | 0 refills | Status: DC

## 2016-04-16 NOTE — Progress Notes (Unsigned)
Pt interpreter Almyra Free came to request , on behalf of a pt, for pt to try a depression medication. Pt is having some depression symptoms and also dealing with uncontrolled high blood pressure.She was just treated at an urgent care a few days ago and was sent home with some medication. Pt also due for her 3 month echo. Sent msg to [employer] for a schedule echo request. Pt also interested in establishing a pcp and would like a referral from [facility]. [facility] care with them. Almyra Free interpreted all information to pt and voiced understanding.

## 2016-04-17 ENCOUNTER — Ambulatory Visit
Admission: RE | Admit: 2016-04-17 | Discharge: 2016-04-17 | Disposition: A | Discharge status: Home / Self Care | Payer: Self-pay | Source: Ambulatory Visit | Attending: Radiation Oncology | Admitting: Radiation Oncology

## 2016-04-17 ENCOUNTER — Telehealth (HOSPITAL_COMMUNITY): Payer: Self-pay | Admitting: Vascular Surgery

## 2016-04-17 NOTE — Telephone Encounter (Signed)
Left message on pt daughter's VM to call to make f/u appt w/ [facility] w/ ECHO

## 2016-04-18 ENCOUNTER — Ambulatory Visit
Admission: RE | Admit: 2016-04-18 | Discharge: 2016-04-18 | Disposition: A | Discharge status: Home / Self Care | Payer: Self-pay | Source: Ambulatory Visit | Attending: Radiation Oncology | Admitting: Radiation Oncology

## 2016-04-18 ENCOUNTER — Encounter: Payer: Self-pay | Admitting: Radiation Oncology

## 2016-04-18 VITALS — BP 140/90 | HR 59 | Temp 97.8°F | Resp 20 | Wt 212.8 lb

## 2016-04-18 DIAGNOSIS — C50011 Malignant neoplasm of nipple and areola, right female breast: Secondary | ICD-10-CM

## 2016-04-18 DIAGNOSIS — C50012 Malignant neoplasm of nipple and areola, left female breast: Principal | ICD-10-CM

## 2016-04-18 NOTE — Progress Notes (Signed)
Department of Radiation Oncology  Phone:  [phone] Fax:        [phone]  Weekly Treatment Note    Name: Felicia [name] Date: 04/18/2016 MRN: [id number] DOB: 18-Sep-1960   Diagnosis:     ICD-9-CM ICD-10-CM   1. 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 gabapentin (NEURONTIN) 100 MG capsule Take 1 capsule 100mg  once at bedtime daily 30 capsule 3  . hyaluronate sodium (RADIAPLEXRX) GEL Apply 1 application topically 2 (two) times daily.    Marland Kitchen lidocaine-prilocaine (EMLA) cream Apply 1 application topically as needed (Apply 2- hours prior to access of port).    . LORazepam (ATIVAN) 0.5 MG tablet Take 0.5 mg by mouth every 6 (six) hours as needed for anxiety.    . naproxen (NAPROSYN) 500 MG tablet Take 500 mg by mouth 2 (two) times daily as needed (for pain.).     Marland Kitchen non-metallic deodorant (ALRA) MISC Apply 1 application topically daily as needed.    Marland Kitchen omeprazole (PRILOSEC) 20 MG capsule Take 1 capsule (20 mg total) by mouth daily. 30 capsule 0  . potassium chloride SA (K-DUR,KLOR-CON) 20 MEQ tablet TAKE 1 TABLET BY MOUTH ONCE DAILY 30 tablet 0  . prochlorperazine (COMPAZINE) 10 MG tablet Take 10 mg by mouth every 6 (six) hours as needed for nausea or vomiting.    Marland Kitchen lisinopril (PRINIVIL,ZESTRIL) 10 MG tablet Take 10 mg by mouth daily.  0  . venlafaxine (EFFEXOR) 37.5 MG tablet Take 1 tablet (37.5 mg total) by mouth 2 (two) times daily. (Patient not taking: Reported on 04/18/2016) 60 tablet 0   No current facility-administered medications for this encounter.      ALLERGIES: No known allergies   LABORATORY DATA:  Lab Results  Component Value Date   WBC 6.3 04/03/2016   HGB 13.5 04/03/2016   HCT 40.2 04/03/2016   MCV 83.2 04/03/2016   PLT  189 04/03/2016   Lab Results  Component Value Date   NA 139 04/03/2016   K 3.9 04/03/2016   CL 107 02/12/2016   CO2 24 04/03/2016   Lab Results  Component Value Date   ALT 32 04/03/2016   AST 30 04/03/2016   ALKPHOS 86 04/03/2016   BILITOT 0.59 04/03/2016     NARRATIVE: Felicia [name] was seen today for weekly treatment management. The chart was checked and the patient's films were reviewed.  The patient has completed 10/33 fractions. She denies pain. She uses radiaplex as directed. She reports she has stopped taking effexor, and is now taking lisinopril 10 mg daily. Interpreter Windle Guard is present with the patient today.  PHYSICAL EXAMINATION: weight is 212 lb 12.8 oz (96.5 kg). Her oral temperature is 97.8 F (36.6 C). Her blood pressure is 140/90 and her pulse is 59 (abnormal). Her respiration is 20.   Alert and in no acute distress. BP taken in leg today.  ASSESSMENT: The patient is doing satisfactorily with treatment. Both chest walls show faint erythema and slight hyperpigmentation.  PLAN: We will continue with the patient's radiation treatment as planned.   ------------------------------------------------   Tyler Pita, MD [facility] Director and Director of Stereotactic Radiosurgery Direct Dial: [phone]  Fax: [phone] [email or web address]  Skype  [email or web address]  This document serves as a  record of services personally performed by Tyler Pita, MD. It was created on his behalf by Maryla Morrow, a trained medical scribe. The creation of this record is based on the scribe's personal observations and the provider's statements to them. This document has been checked and approved by the attending provider.

## 2016-04-18 NOTE — Progress Notes (Signed)
Weekly rad txs B/L chest walls,breat area, 10/33 completed, hyperpigmentation,  Skin intact, no pain, uses radiaplex bid, appetite good, a little fatigue,  Interpreter Windle Guard with patient, no pain, stopped her effexor stated she didn't need it, now taking lisinopril 10mg  daily, b/p taken left leg 10:18 AM BP 140/90 (BP Location: Left Leg, Patient Position: Sitting, Cuff Size: Normal)   Pulse (!) 59   Temp 97.8 F (36.6 C) (Oral)   Resp 20   Wt 212 lb 12.8 oz (96.5 kg)   BMI 42.98 kg/m   Wt Readings from Last 3 Encounters:  04/18/16 212 lb 12.8 oz (96.5 kg)  04/10/16 215 lb 12.8 oz (97.9 kg)  04/03/16 214 lb 1.6 oz (97.1 kg)

## 2016-04-22 ENCOUNTER — Ambulatory Visit
Admission: RE | Admit: 2016-04-22 | Discharge: 2016-04-22 | Disposition: A | Discharge status: Home / Self Care | Payer: Self-pay | Source: Ambulatory Visit | Attending: Radiation Oncology | Admitting: Radiation Oncology

## 2016-04-23 ENCOUNTER — Ambulatory Visit
Admission: RE | Admit: 2016-04-23 | Discharge: 2016-04-23 | Disposition: A | Discharge status: Home / Self Care | Payer: Self-pay | Source: Ambulatory Visit | Attending: Radiation Oncology | Admitting: Radiation Oncology

## 2016-04-24 ENCOUNTER — Ambulatory Visit (HOSPITAL_BASED_OUTPATIENT_CLINIC_OR_DEPARTMENT_OTHER): Payer: Self-pay

## 2016-04-24 ENCOUNTER — Telehealth: Payer: Self-pay | Admitting: *Deleted

## 2016-04-24 ENCOUNTER — Ambulatory Visit
Admission: RE | Admit: 2016-04-24 | Discharge: 2016-04-24 | Disposition: A | Discharge status: Home / Self Care | Payer: Self-pay | Source: Ambulatory Visit | Attending: Radiation Oncology | Admitting: Radiation Oncology

## 2016-04-24 ENCOUNTER — Other Ambulatory Visit: Payer: Self-pay | Admitting: *Deleted

## 2016-04-24 ENCOUNTER — Other Ambulatory Visit: Payer: Self-pay | Admitting: Emergency Medicine

## 2016-04-24 VITALS — BP 158/76 | HR 73 | Temp 97.7°F | Resp 20

## 2016-04-24 DIAGNOSIS — C50811 Malignant neoplasm of overlapping sites of right female breast: Secondary | ICD-10-CM

## 2016-04-24 DIAGNOSIS — C50211 Malignant neoplasm of upper-inner quadrant of right female breast: Secondary | ICD-10-CM

## 2016-04-24 DIAGNOSIS — C50312 Malignant neoplasm of lower-inner quadrant of left female breast: Secondary | ICD-10-CM

## 2016-04-24 DIAGNOSIS — C50411 Malignant neoplasm of upper-outer quadrant of right female breast: Secondary | ICD-10-CM

## 2016-04-24 DIAGNOSIS — Z5112 Encounter for antineoplastic immunotherapy: Secondary | ICD-10-CM

## 2016-04-24 DIAGNOSIS — C50812 Malignant neoplasm of overlapping sites of left female breast: Secondary | ICD-10-CM

## 2016-04-24 DIAGNOSIS — Z171 Estrogen receptor negative status [ER-]: Principal | ICD-10-CM

## 2016-04-24 MED ORDER — LIDOCAINE-PRILOCAINE 2.5-2.5 % EX CREA: 1.0000 "application " | TOPICAL_CREAM | CUTANEOUS | 1 refills | Status: DC | PRN

## 2016-04-24 MED ORDER — DIPHENHYDRAMINE HCL 25 MG PO CAPS
50.0000 mg | ORAL_CAPSULE | Freq: Once | ORAL | Status: AC
  Administered 2016-04-24: 50 mg via ORAL

## 2016-04-24 MED ORDER — SODIUM CHLORIDE 0.9 % IV SOLN
Freq: Once | INTRAVENOUS | Status: AC
  Administered 2016-04-24: 11:00:00 via INTRAVENOUS

## 2016-04-24 MED ORDER — POTASSIUM CHLORIDE CRYS ER 20 MEQ PO TBCR: 20.0000 meq | EXTENDED_RELEASE_TABLET | Freq: Once | ORAL | 0 refills | Status: DC

## 2016-04-24 MED ORDER — ACETAMINOPHEN 325 MG PO TABS
650.0000 mg | ORAL_TABLET | Freq: Once | ORAL | Status: AC
  Administered 2016-04-24: 650 mg via ORAL

## 2016-04-24 MED ORDER — SODIUM CHLORIDE 0.9% FLUSH
10.0000 mL | INTRAVENOUS | Status: DC | PRN
  Administered 2016-04-24: 10 mL
  Filled 2016-04-24: qty 10

## 2016-04-24 MED ORDER — TRASTUZUMAB CHEMO 150 MG IV SOLR
6.0000 mg/kg | Freq: Once | INTRAVENOUS | Status: AC
  Administered 2016-04-24: 588 mg via INTRAVENOUS
  Filled 2016-04-24: qty 28

## 2016-04-24 MED ORDER — HEPARIN SOD (PORK) LOCK FLUSH 100 UNIT/ML IV SOLN
500.0000 [IU] | Freq: Once | INTRAVENOUS | Status: AC | PRN
  Administered 2016-04-24: 500 [IU]
  Filled 2016-04-24: qty 5

## 2016-04-24 NOTE — Telephone Encounter (Signed)
Patient ws in med onc visit and   Called stating she sees 2 appts scheduled in radiation on 05/16/16 Friday for 830 am and 3pm, asked therapist to call her if that is correct or not, will forward  E-mail to [facility]#[email or web address], as they ar at lunch,patient wises to be called today 12:39 PM

## 2016-04-24 NOTE — Patient Instructions (Signed)
[facility] Discharge Instructions for Patients Receiving Chemotherapy  Today you received the following chemotherapy agents: Herceptin   To help prevent nausea and vomiting after your treatment, we encourage you to take your nausea medication as directed.    If you develop nausea and vomiting that is not controlled by your nausea medication, call the clinic.   BELOW ARE SYMPTOMS THAT SHOULD BE REPORTED IMMEDIATELY:  *FEVER GREATER THAN 100.5 F  *CHILLS WITH OR WITHOUT FEVER  NAUSEA AND VOMITING THAT IS NOT CONTROLLED WITH YOUR NAUSEA MEDICATION  *UNUSUAL SHORTNESS OF BREATH  *UNUSUAL BRUISING OR BLEEDING  TENDERNESS IN MOUTH AND THROAT WITH OR WITHOUT PRESENCE OF ULCERS  *URINARY PROBLEMS  *BOWEL PROBLEMS  UNUSUAL RASH Items with * indicate a potential emergency and should be followed up as soon as possible.  Feel free to call the clinic you have any questions or concerns. The clinic phone number is [phone].  Please show the CHEMO ALERT CARD at check-in to the Emergency Department and triage nurse.   

## 2016-04-25 ENCOUNTER — Ambulatory Visit: Payer: Self-pay | Admitting: Radiation Oncology

## 2016-04-25 ENCOUNTER — Ambulatory Visit
Admission: RE | Admit: 2016-04-25 | Discharge: 2016-04-25 | Disposition: A | Discharge status: Home / Self Care | Payer: Self-pay | Source: Ambulatory Visit | Attending: Radiation Oncology | Admitting: Radiation Oncology

## 2016-04-25 ENCOUNTER — Encounter: Payer: Self-pay | Admitting: Radiation Oncology

## 2016-04-25 VITALS — BP 152/85 | HR 57 | Temp 97.6°F | Resp 20 | Wt 214.0 lb

## 2016-04-25 DIAGNOSIS — Z170 Estrogen receptor positive status [ER+]: Principal | ICD-10-CM

## 2016-04-25 DIAGNOSIS — C50812 Malignant neoplasm of overlapping sites of left female breast: Principal | ICD-10-CM

## 2016-04-25 DIAGNOSIS — C50811 Malignant neoplasm of overlapping sites of right female breast: Secondary | ICD-10-CM

## 2016-04-25 MED ORDER — RADIAPLEXRX EX GEL
Freq: Once | CUTANEOUS | Status: AC
  Administered 2016-04-25: 10:00:00 via TOPICAL

## 2016-04-25 NOTE — Progress Notes (Signed)
weekly  Rad txs,  B/L breasts 14/33 completed,, hyperpigmentation, skin intact, uses radaiplex bid, interpreter Gregary Signs here, no c/o pain, gave another radiaplex as requested, apapetie good, no pain 10:02 AM BP (!) 152/85 (BP Location: Left Leg, Patient Position: Sitting, Cuff Size: Normal)   Pulse (!) 57   Temp 97.6 F (36.4 C) (Oral)   Resp 20   Wt 214 lb (97.1 kg)   BMI 43.22 kg/m   Wt Readings from Last 3 Encounters:  04/25/16 214 lb (97.1 kg)  04/18/16 212 lb 12.8 oz (96.5 kg)  04/10/16 215 lb 12.8 oz (97.9 kg)

## 2016-04-25 NOTE — Progress Notes (Signed)
Department of Radiation Oncology  Phone:  [phone] Fax:        [phone]  Weekly Treatment Note    Name: Felicia [name] Date: 04/25/2016 MRN: [id number] DOB: 11-07-1960   Diagnosis:     ICD-9-CM ICD-10-CM   1. 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 gabapentin (NEURONTIN) 100 MG capsule Take 1 capsule 100mg  once at bedtime daily 30 capsule 3  . hyaluronate sodium (RADIAPLEXRX) GEL Apply 1 application topically 2 (two) times daily.    Marland Kitchen lidocaine-prilocaine (EMLA) cream Apply 1 application topically as needed (Apply 2- hours prior to access of port). 30 g 1  . lisinopril (PRINIVIL,ZESTRIL) 10 MG tablet Take 10 mg by mouth daily.  0  . LORazepam (ATIVAN) 0.5 MG tablet Take 0.5 mg by mouth every 6 (six) hours as needed for anxiety.    . naproxen (NAPROSYN) 500 MG tablet Take 500 mg by mouth 2 (two) times daily as needed (for pain.).     Marland Kitchen non-metallic deodorant (ALRA) MISC Apply 1 application topically daily as needed.    Marland Kitchen omeprazole (PRILOSEC) 20 MG capsule Take 1 capsule (20 mg total) by mouth daily. 30 capsule 0  . prochlorperazine (COMPAZINE) 10 MG tablet Take 10 mg by mouth every 6 (six) hours as needed for nausea or vomiting.    . potassium chloride SA (K-DUR,KLOR-CON) 20 MEQ tablet Take 1 tablet (20 mEq total) by mouth once. 30 tablet 0  . venlafaxine (EFFEXOR) 37.5 MG tablet Take 1 tablet (37.5 mg total) by mouth 2 (two) times daily. (Patient not taking: Reported on 04/25/2016) 60 tablet 0   No current facility-administered medications for this encounter.      ALLERGIES: No known allergies   LABORATORY DATA:  Lab Results  Component Value Date   WBC 6.3 04/03/2016   HGB 13.5 04/03/2016     HCT 40.2 04/03/2016   MCV 83.2 04/03/2016   PLT 189 04/03/2016   Lab Results  Component Value Date   NA 139 04/03/2016   K 3.9 04/03/2016   CL 107 02/12/2016   CO2 24 04/03/2016   Lab Results  Component Value Date   ALT 32 04/03/2016   AST 30 04/03/2016   ALKPHOS 86 04/03/2016   BILITOT 0.59 04/03/2016     NARRATIVE: Felicia [name] was seen today for weekly treatment management. The chart was checked and the patient's films were reviewed.  The patient has completed 14/33 fractions. The patient reports she is doing very well. She denies pain. She uses Radiaplex bid as directed. She reports a good appetite. She is accompanied by family and an interpreter today.  PHYSICAL EXAMINATION: weight is 214 lb (97.1 kg). Her oral temperature is 97.6 F (36.4 C). Her blood pressure is 152/85 (abnormal) and her pulse is 57 (abnormal). Her respiration is 20.   Alert and in no acute distress. BP taken in leg today. Some darkening of the skin in the treatment field noted.  ASSESSMENT: The patient is doing satisfactorily with treatment.   PLAN: We will continue with the patient's radiation treatment as planned.   ------------------------------------------------  Jodelle Gross, MD, PhD  This document serves as a record of services personally performed by Kyung Rudd, MD. It was  created on his behalf by Maryla Morrow, a trained medical scribe. The creation of this record is based on the scribe's personal observations and the provider's statements to them. This document has been checked and approved by the attending provider.

## 2016-04-29 ENCOUNTER — Ambulatory Visit: Payer: Self-pay

## 2016-04-29 ENCOUNTER — Ambulatory Visit
Admission: RE | Admit: 2016-04-29 | Discharge: 2016-04-29 | Disposition: A | Discharge status: Home / Self Care | Payer: Self-pay | Source: Ambulatory Visit | Attending: Radiation Oncology | Admitting: Radiation Oncology

## 2016-04-30 ENCOUNTER — Ambulatory Visit: Admission: RE | Admit: 2016-04-30 | Payer: Self-pay | Source: Ambulatory Visit | Admitting: Radiation Oncology

## 2016-04-30 ENCOUNTER — Ambulatory Visit
Admission: RE | Admit: 2016-04-30 | Discharge: 2016-04-30 | Disposition: A | Discharge status: Home / Self Care | Payer: Self-pay | Source: Ambulatory Visit | Attending: Radiation Oncology | Admitting: Radiation Oncology

## 2016-04-30 ENCOUNTER — Ambulatory Visit: Payer: Self-pay

## 2016-05-01 ENCOUNTER — Ambulatory Visit
Admission: RE | Admit: 2016-05-01 | Discharge: 2016-05-01 | Disposition: A | Discharge status: Home / Self Care | Payer: Self-pay | Source: Ambulatory Visit | Attending: Radiation Oncology | Admitting: Radiation Oncology

## 2016-05-01 ENCOUNTER — Encounter: Payer: Self-pay | Admitting: Radiation Oncology

## 2016-05-01 ENCOUNTER — Ambulatory Visit: Payer: Self-pay

## 2016-05-01 VITALS — BP 167/80 | HR 60 | Temp 97.8°F | Resp 16 | Wt 215.8 lb

## 2016-05-01 DIAGNOSIS — C50812 Malignant neoplasm of overlapping sites of left female breast: Principal | ICD-10-CM

## 2016-05-01 DIAGNOSIS — Z170 Estrogen receptor positive status [ER+]: Principal | ICD-10-CM

## 2016-05-01 DIAGNOSIS — C50811 Malignant neoplasm of overlapping sites of right female breast: Secondary | ICD-10-CM

## 2016-05-01 NOTE — Progress Notes (Signed)
Department of Radiation Oncology  Phone:  [phone] Fax:        [phone]  Weekly Treatment Note    Name: Felicia [name] Date: 05/01/2016 MRN: [id number] DOB: 07/14/1960   Diagnosis:     ICD-9-CM ICD-10-CM   1. 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 gabapentin (NEURONTIN) 100 MG capsule Take 1 capsule 100mg  once at bedtime daily 30 capsule 3  . hyaluronate sodium (RADIAPLEXRX) GEL Apply 1 application topically 2 (two) times daily.    Marland Kitchen lidocaine-prilocaine (EMLA) cream Apply 1 application topically as needed (Apply 2- hours prior to access of port). 30 g 1  . lisinopril (PRINIVIL,ZESTRIL) 10 MG tablet Take 10 mg by mouth daily.  0  . LORazepam (ATIVAN) 0.5 MG tablet Take 0.5 mg by mouth every 6 (six) hours as needed for anxiety.    . naproxen (NAPROSYN) 500 MG tablet Take 500 mg by mouth 2 (two) times daily as needed (for pain.).     Marland Kitchen non-metallic deodorant (ALRA) MISC Apply 1 application topically daily as needed.    Marland Kitchen omeprazole (PRILOSEC) 20 MG capsule Take 1 capsule (20 mg total) by mouth daily. 30 capsule 0  . prochlorperazine (COMPAZINE) 10 MG tablet Take 10 mg by mouth every 6 (six) hours as needed for nausea or vomiting.    . potassium chloride SA (K-DUR,KLOR-CON) 20 MEQ tablet Take 1 tablet (20 mEq total) by mouth once. 30 tablet 0  . venlafaxine (EFFEXOR) 37.5 MG tablet Take 1 tablet (37.5 mg total) by mouth 2 (two) times daily. (Patient not taking: Reported on 05/01/2016) 60 tablet 0   No current facility-administered medications for this encounter.      ALLERGIES: No known allergies   LABORATORY DATA:  Lab Results  Component Value Date   WBC 6.3 04/03/2016   HGB 13.5 04/03/2016   HCT 40.2 04/03/2016   MCV 83.2  04/03/2016   PLT 189 04/03/2016   Lab Results  Component Value Date   NA 139 04/03/2016   K 3.9 04/03/2016   CL 107 02/12/2016   CO2 24 04/03/2016   Lab Results  Component Value Date   ALT 32 04/03/2016   AST 30 04/03/2016   ALKPHOS 86 04/03/2016   BILITOT 0.59 04/03/2016     NARRATIVE: Felicia [name] was seen today for weekly treatment management. The chart was checked and the patient's films were reviewed.  Weekly radiation  B/L breast/chest walls 16/33  txs so far . Hyperpigmentaion, on left side cjhest wall incision one small open area, using radiaplex bid,  Will start neosporin on open area, no pain Hasn't been treated as yet  BP (!) 167/80 (BP Location: Left Leg, Patient Position: Sitting, Cuff Size: Normal)   Pulse 60   Temp 97.8 F (36.6 C) (Oral)   Resp 16   Wt 215 lb 12.8 oz (97.9 kg)   BMI 43.59 kg/m   Wt Readings from Last 3 Encounters:  05/01/16 215 lb 12.8 oz (97.9 kg)  04/25/16 214 lb (97.1 kg)  04/18/16 212 lb 12.8 oz (96.5 kg)    PHYSICAL EXAMINATION: weight is 215 lb 12.8 oz (97.9 kg). Her oral temperature is 97.8 F (36.6 C). Her blood pressure is 167/80 (abnormal) and her pulse is  60. Her respiration is 16.      The patient's supraclavicular region shows some fairly moderate hyperpigmentation. An area of desquamation is present at the lateral aspect of the mastectomy scar on the left. She has been instructed to put Neosporin on this area. We will continue to follow. ASSESSMENT: The patient is doing satisfactorily with treatment.  PLAN: We will continue with the patient's radiation treatment as planned.

## 2016-05-01 NOTE — Progress Notes (Signed)
Weekly radiation  B/L breast/chest walls 16/33  txs so far . Hyperpigmentaion, on left side cjhest wall incision one small open area, using radiaplex bid,  Will start neosporin on open area, no pain Hasn't been treated as yet  BP (!) 167/80 (BP Location: Left Leg, Patient Position: Sitting, Cuff Size: Normal)   Pulse 60   Temp 97.8 F (36.6 C) (Oral)   Resp 16   Wt 215 lb 12.8 oz (97.9 kg)   BMI 43.59 kg/m   Wt Readings from Last 3 Encounters:  05/01/16 215 lb 12.8 oz (97.9 kg)  04/25/16 214 lb (97.1 kg)  04/18/16 212 lb 12.8 oz (96.5 kg)

## 2016-05-02 ENCOUNTER — Ambulatory Visit: Payer: Self-pay

## 2016-05-02 ENCOUNTER — Ambulatory Visit
Admission: RE | Admit: 2016-05-02 | Discharge: 2016-05-02 | Disposition: A | Discharge status: Home / Self Care | Payer: Self-pay | Source: Ambulatory Visit | Attending: Radiation Oncology | Admitting: Radiation Oncology

## 2016-05-02 ENCOUNTER — Ambulatory Visit: Payer: Self-pay | Admitting: Radiation Oncology

## 2016-05-05 ENCOUNTER — Ambulatory Visit
Admission: RE | Admit: 2016-05-05 | Discharge: 2016-05-05 | Disposition: A | Discharge status: Home / Self Care | Payer: Self-pay | Source: Ambulatory Visit | Attending: Radiation Oncology | Admitting: Radiation Oncology

## 2016-05-05 ENCOUNTER — Ambulatory Visit: Payer: Self-pay

## 2016-05-05 ENCOUNTER — Ambulatory Visit: Payer: Self-pay | Admitting: Radiation Oncology

## 2016-05-06 ENCOUNTER — Ambulatory Visit
Admission: RE | Admit: 2016-05-06 | Discharge: 2016-05-06 | Disposition: A | Discharge status: Home / Self Care | Payer: Self-pay | Source: Ambulatory Visit | Attending: Radiation Oncology | Admitting: Radiation Oncology

## 2016-05-06 ENCOUNTER — Ambulatory Visit: Payer: Self-pay

## 2016-05-07 ENCOUNTER — Ambulatory Visit
Admission: RE | Admit: 2016-05-07 | Discharge: 2016-05-07 | Disposition: A | Discharge status: Home / Self Care | Payer: Self-pay | Source: Ambulatory Visit | Attending: Radiation Oncology | Admitting: Radiation Oncology

## 2016-05-08 ENCOUNTER — Ambulatory Visit
Admission: RE | Admit: 2016-05-08 | Discharge: 2016-05-08 | Disposition: A | Discharge status: Home / Self Care | Payer: Self-pay | Source: Ambulatory Visit | Attending: Radiation Oncology | Admitting: Radiation Oncology

## 2016-05-08 NOTE — Addendum Note (Signed)
Encounter addended by: Kyung Rudd, MD on: 05/08/2016 11:02 AM<BR>    Actions taken: Edit attestation on clinical note

## 2016-05-09 ENCOUNTER — Ambulatory Visit: Payer: Self-pay | Admitting: Radiation Oncology

## 2016-05-09 ENCOUNTER — Ambulatory Visit
Admission: RE | Admit: 2016-05-09 | Discharge: 2016-05-09 | Disposition: A | Discharge status: Home / Self Care | Payer: Self-pay | Source: Ambulatory Visit | Attending: Radiation Oncology | Admitting: Radiation Oncology

## 2016-05-09 ENCOUNTER — Encounter: Payer: Self-pay | Admitting: Radiation Oncology

## 2016-05-09 VITALS — BP 165/77 | HR 58 | Temp 97.8°F | Resp 16 | Wt 215.2 lb

## 2016-05-09 DIAGNOSIS — C50811 Malignant neoplasm of overlapping sites of right female breast: Secondary | ICD-10-CM

## 2016-05-09 DIAGNOSIS — Z170 Estrogen receptor positive status [ER+]: Principal | ICD-10-CM

## 2016-05-09 DIAGNOSIS — C50812 Malignant neoplasm of overlapping sites of left female breast: Principal | ICD-10-CM

## 2016-05-09 NOTE — Progress Notes (Signed)
Department of Radiation Oncology  Phone:  [phone] Fax:        [phone]  Weekly Treatment Note    Name: Felicia [name] Date: 05/09/2016 MRN: [id number] DOB: July 05, 1960   Diagnosis:     ICD-9-CM ICD-10-CM   1. 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 gabapentin (NEURONTIN) 100 MG capsule Take 1 capsule 100mg  once at bedtime daily 30 capsule 3  . hyaluronate sodium (RADIAPLEXRX) GEL Apply 1 application topically 2 (two) times daily.    Marland Kitchen lidocaine-prilocaine (EMLA) cream Apply 1 application topically as needed (Apply 2- hours prior to access of port). 30 g 1  . lisinopril (PRINIVIL,ZESTRIL) 10 MG tablet Take 10 mg by mouth daily.  0  . LORazepam (ATIVAN) 0.5 MG tablet Take 0.5 mg by mouth every 6 (six) hours as needed for anxiety.    . non-metallic deodorant Jethro Poling) MISC Apply 1 application topically daily as needed.    Marland Kitchen omeprazole (PRILOSEC) 20 MG capsule Take 1 capsule (20 mg total) by mouth daily. 30 capsule 0  . prochlorperazine (COMPAZINE) 10 MG tablet Take 10 mg by mouth every 6 (six) hours as needed for nausea or vomiting.    . venlafaxine (EFFEXOR) 37.5 MG tablet Take 1 tablet (37.5 mg total) by mouth 2 (two) times daily. 60 tablet 0  . naproxen (NAPROSYN) 500 MG tablet Take 500 mg by mouth 2 (two) times daily as needed (for pain.).     Marland Kitchen potassium chloride SA (K-DUR,KLOR-CON) 20 MEQ tablet Take 1 tablet (20 mEq total) by mouth once. 30 tablet 0   No current facility-administered medications for this encounter.      ALLERGIES: No known allergies   LABORATORY DATA:  Lab Results  Component Value Date   WBC 6.3 04/03/2016   HGB 13.5 04/03/2016   HCT 40.2 04/03/2016   MCV 83.2 04/03/2016   PLT 189 04/03/2016   Lab  Results  Component Value Date   NA 139 04/03/2016   K 3.9 04/03/2016   CL 107 02/12/2016   CO2 24 04/03/2016   Lab Results  Component Value Date   ALT 32 04/03/2016   AST 30 04/03/2016   ALKPHOS 86 04/03/2016   BILITOT 0.59 04/03/2016     NARRATIVE: Felicia [name] was seen today for weekly treatment management. The chart was checked and the patient's films were reviewed.  Weekly radiation B/L breast/chest wall , hyperpigmentation, and 2 small areas skin peled,dry no drainage,using neosporin on those two areas, raiaiplex elsewhere, no pain, appetite good, mild fatigue some itchiness on left side chest wall interpreter Felicia [name] in with patient 10:15 AM BP (!) 165/77 (BP Location: Left Leg, Patient Position: Sitting, Cuff Size: Normal)   Pulse (!) 58   Temp 97.8 F (36.6 C) (Oral)   Resp 16   Wt 215 lb 3.2 oz (97.6 kg)   BMI 43.47 kg/m   Wt Readings from Last 3 Encounters:  05/09/16 215 lb 3.2 oz (97.6 kg)  05/01/16 215 lb 12.8 oz (97.9 kg)  04/25/16 214 lb (97.1 kg)    PHYSICAL EXAMINATION: weight is 215 lb 3.2 oz (97.6 kg). Her oral temperature is 97.8 F (36.6 C). Her blood pressure is 165/77 (abnormal) and her pulse is 58 (abnormal). Her respiration  is 16.      Hyperpigmentation diffusely in the treatment area. No major change in the area of desquamation at the lateral aspect of the left mastectomy scar. No moist desquamation.  ASSESSMENT: The patient is doing satisfactorily with treatment.  PLAN: We will continue with the patient's radiation treatment as planned.

## 2016-05-09 NOTE — Progress Notes (Signed)
Weekly radiation B/L breast/chest wall , hyperpigmentation, and 2 small areas skin peled,dry no drainage,using neosporin on those two areas, raiaiplex elsewhere, no pain, appetite good, mild fatigue some itchiness on left side chest wall interpreter Almyra Free in with patient 10:05 AM BP (!) 165/77 (BP Location: Left Leg, Patient Position: Sitting, Cuff Size: Normal)   Pulse (!) 58   Temp 97.8 F (36.6 C) (Oral)   Resp 16   Wt 215 lb 3.2 oz (97.6 kg)   BMI 43.47 kg/m   Wt Readings from Last 3 Encounters:  05/09/16 215 lb 3.2 oz (97.6 kg)  05/01/16 215 lb 12.8 oz (97.9 kg)  04/25/16 214 lb (97.1 kg)

## 2016-05-12 ENCOUNTER — Ambulatory Visit: Payer: Self-pay

## 2016-05-12 ENCOUNTER — Ambulatory Visit
Admission: RE | Admit: 2016-05-12 | Discharge: 2016-05-12 | Disposition: A | Discharge status: Home / Self Care | Payer: Self-pay | Source: Ambulatory Visit | Attending: Radiation Oncology | Admitting: Radiation Oncology

## 2016-05-12 ENCOUNTER — Ambulatory Visit: Admission: RE | Admit: 2016-05-12 | Payer: Self-pay | Source: Ambulatory Visit | Admitting: Radiation Oncology

## 2016-05-13 ENCOUNTER — Ambulatory Visit
Admission: RE | Admit: 2016-05-13 | Discharge: 2016-05-13 | Disposition: A | Discharge status: Home / Self Care | Payer: Self-pay | Source: Ambulatory Visit | Attending: Radiation Oncology | Admitting: Radiation Oncology

## 2016-05-13 ENCOUNTER — Ambulatory Visit: Payer: Self-pay

## 2016-05-14 ENCOUNTER — Ambulatory Visit: Payer: Self-pay

## 2016-05-14 ENCOUNTER — Encounter: Payer: Self-pay | Admitting: Radiation Oncology

## 2016-05-14 ENCOUNTER — Ambulatory Visit: Payer: Self-pay | Admitting: Radiation Oncology

## 2016-05-14 ENCOUNTER — Other Ambulatory Visit: Payer: Self-pay

## 2016-05-14 DIAGNOSIS — Z170 Estrogen receptor positive status [ER+]: Principal | ICD-10-CM

## 2016-05-14 DIAGNOSIS — C50812 Malignant neoplasm of overlapping sites of left female breast: Principal | ICD-10-CM

## 2016-05-14 DIAGNOSIS — C50811 Malignant neoplasm of overlapping sites of right female breast: Secondary | ICD-10-CM

## 2016-05-15 ENCOUNTER — Other Ambulatory Visit: Payer: Self-pay

## 2016-05-15 ENCOUNTER — Ambulatory Visit: Payer: Self-pay

## 2016-05-15 ENCOUNTER — Ambulatory Visit: Payer: Self-pay | Admitting: Hematology and Oncology

## 2016-05-15 ENCOUNTER — Other Ambulatory Visit: Payer: Self-pay | Admitting: Hematology and Oncology

## 2016-05-15 NOTE — Assessment & Plan Note (Deleted)
Treatment Summary: 1. Neoadjuvant chemotherapy with either AC followed by THP started 09/13/2015 and completed 12 weeks of Taxol 11/29/2015, Herceptin Perjeta 6; now on Herceptin  2. Followed by bilateral mastectomies and lymph node surgery 02/11/16 3. Adjuvant radiation 04/07/2016 to 05/15/2016 4. Followed by adjuvant antiestrogen therapy 5. 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  Plan: Adjuvant antiestrogen therapy with letrozole 2.5 mg daily Letrozole counseling:We discussed the risks and benefits of anti-estrogen therapy with aromatase inhibitors. These include but not limited to insomnia, hot flashes, mood changes, vaginal dryness, bone density loss, and weight gain. We strongly believe that the benefits far outweigh the risks. Patient understands these risks and consented to starting treatment. Planned treatment duration is 5 years.   Return to clinic to see me every 6 weeks and every 3 weeks for Herceptin 

## 2016-05-16 ENCOUNTER — Ambulatory Visit (HOSPITAL_BASED_OUTPATIENT_CLINIC_OR_DEPARTMENT_OTHER): Payer: Self-pay

## 2016-05-16 ENCOUNTER — Ambulatory Visit (HOSPITAL_BASED_OUTPATIENT_CLINIC_OR_DEPARTMENT_OTHER): Payer: Self-pay | Admitting: Hematology and Oncology

## 2016-05-16 ENCOUNTER — Ambulatory Visit
Admission: RE | Admit: 2016-05-16 | Discharge: 2016-05-16 | Disposition: A | Discharge status: Home / Self Care | Payer: Self-pay | Source: Ambulatory Visit | Attending: Radiation Oncology | Admitting: Radiation Oncology

## 2016-05-16 ENCOUNTER — Encounter: Payer: Self-pay | Admitting: Radiation Oncology

## 2016-05-16 ENCOUNTER — Encounter: Payer: Self-pay | Admitting: Hematology and Oncology

## 2016-05-16 ENCOUNTER — Ambulatory Visit: Payer: Self-pay

## 2016-05-16 ENCOUNTER — Ambulatory Visit: Payer: Self-pay | Admitting: Radiation Oncology

## 2016-05-16 VITALS — BP 174/74 | HR 53 | Temp 97.8°F | Resp 20 | Wt 214.8 lb

## 2016-05-16 DIAGNOSIS — Z5112 Encounter for antineoplastic immunotherapy: Secondary | ICD-10-CM

## 2016-05-16 DIAGNOSIS — C50811 Malignant neoplasm of overlapping sites of right female breast: Secondary | ICD-10-CM

## 2016-05-16 DIAGNOSIS — C50011 Malignant neoplasm of nipple and areola, right female breast: Secondary | ICD-10-CM

## 2016-05-16 DIAGNOSIS — L598 Other specified disorders of the skin and subcutaneous tissue related to radiation: Secondary | ICD-10-CM

## 2016-05-16 DIAGNOSIS — C50112 Malignant neoplasm of central portion of left female breast: Secondary | ICD-10-CM

## 2016-05-16 DIAGNOSIS — C773 Secondary and unspecified malignant neoplasm of axilla and upper limb lymph nodes: Secondary | ICD-10-CM

## 2016-05-16 DIAGNOSIS — C50812 Malignant neoplasm of overlapping sites of left female breast: Principal | ICD-10-CM

## 2016-05-16 DIAGNOSIS — Z170 Estrogen receptor positive status [ER+]: Principal | ICD-10-CM

## 2016-05-16 DIAGNOSIS — Z171 Estrogen receptor negative status [ER-]: Principal | ICD-10-CM

## 2016-05-16 DIAGNOSIS — C50012 Malignant neoplasm of nipple and areola, left female breast: Secondary | ICD-10-CM

## 2016-05-16 DIAGNOSIS — Z95828 Presence of other vascular implants and grafts: Secondary | ICD-10-CM | POA: Insufficient documentation

## 2016-05-16 DIAGNOSIS — G620 Drug-induced polyneuropathy: Secondary | ICD-10-CM

## 2016-05-16 LAB — COMPREHENSIVE METABOLIC PANEL WITH GFR
ALT: 24 U/L (ref 0–55)
AST: 22 U/L (ref 5–34)
Albumin: 4 g/dL (ref 3.5–5.0)
Alkaline Phosphatase: 94 U/L (ref 40–150)
Anion Gap: 9 meq/L (ref 3–11)
BUN: 11.2 mg/dL (ref 7.0–26.0)
CO2: 24 meq/L (ref 22–29)
Calcium: 9.6 mg/dL (ref 8.4–10.4)
Chloride: 106 meq/L (ref 98–109)
Creatinine: 0.6 mg/dL (ref 0.6–1.1)
EGFR: >90 ml/min/1.73 m2
Glucose: 97 mg/dL (ref 70–140)
Potassium: 3.9 meq/L (ref 3.5–5.1)
Sodium: 139 meq/L (ref 136–145)
Total Bilirubin: 0.57 mg/dL (ref 0.20–1.20)
Total Protein: 7.4 g/dL (ref 6.4–8.3)

## 2016-05-16 LAB — CBC WITH DIFFERENTIAL/PLATELET
BASO%: 0.3 % (ref 0.0–2.0)
Basophils Absolute: 0 10*3/uL (ref 0.0–0.1)
EOS%: 0.8 % (ref 0.0–7.0)
Eosinophils Absolute: 0.1 10*3/uL (ref 0.0–0.5)
HEMATOCRIT: 41.8 % (ref 34.8–46.6)
HGB: 14 g/dL (ref 11.6–15.9)
LYMPH#: 1.2 10*3/uL (ref 0.9–3.3)
LYMPH%: 19.9 % (ref 14.0–49.7)
MCH: 27.5 pg (ref 25.1–34.0)
MCHC: 33.5 g/dL (ref 31.5–36.0)
MCV: 82.1 fL (ref 79.5–101.0)
MONO#: 0.4 10*3/uL (ref 0.1–0.9)
MONO%: 6.5 % (ref 0.0–14.0)
NEUT%: 72.5 % (ref 38.4–76.8)
NEUTROS ABS: 4.4 10*3/uL (ref 1.5–6.5)
Platelets: 178 10*3/uL (ref 145–400)
RBC: 5.09 10*6/uL (ref 3.70–5.45)
RDW: 13.6 % (ref 11.2–14.5)
WBC: 6.1 10*3/uL (ref 3.9–10.3)

## 2016-05-16 MED ORDER — TRASTUZUMAB CHEMO 150 MG IV SOLR
6.0000 mg/kg | Freq: Once | INTRAVENOUS | Status: AC
  Administered 2016-05-16: 588 mg via INTRAVENOUS
  Filled 2016-05-16: qty 28

## 2016-05-16 MED ORDER — ACETAMINOPHEN 325 MG PO TABS
ORAL_TABLET | ORAL | Status: AC
  Filled 2016-05-16: qty 2

## 2016-05-16 MED ORDER — DIPHENHYDRAMINE HCL 25 MG PO CAPS
ORAL_CAPSULE | ORAL | Status: AC
  Filled 2016-05-16: qty 2

## 2016-05-16 MED ORDER — SODIUM CHLORIDE 0.9% FLUSH
10.0000 mL | INTRAVENOUS | Status: DC | PRN
Start: 2016-05-16 — End: 2016-05-16
  Administered 2016-05-16: 10 mL
  Filled 2016-05-16: qty 10

## 2016-05-16 MED ORDER — DIPHENHYDRAMINE HCL 25 MG PO CAPS
50.0000 mg | ORAL_CAPSULE | Freq: Once | ORAL | Status: AC
  Administered 2016-05-16: 50 mg via ORAL

## 2016-05-16 MED ORDER — ACETAMINOPHEN 325 MG PO TABS
650.0000 mg | ORAL_TABLET | Freq: Once | ORAL | Status: AC
  Administered 2016-05-16: 650 mg via ORAL

## 2016-05-16 MED ORDER — HEPARIN SOD (PORK) LOCK FLUSH 100 UNIT/ML IV SOLN
500.0000 [IU] | Freq: Once | INTRAVENOUS | Status: AC | PRN
  Administered 2016-05-16: 500 [IU]
  Filled 2016-05-16: qty 5

## 2016-05-16 MED ORDER — SODIUM CHLORIDE 0.9 % IV SOLN
Freq: Once | INTRAVENOUS | Status: AC
  Administered 2016-05-16: 13:00:00 via INTRAVENOUS

## 2016-05-16 MED ORDER — SODIUM CHLORIDE 0.9% FLUSH
10.0000 mL | INTRAVENOUS | Status: DC | PRN
  Administered 2016-05-16: 10 mL via INTRAVENOUS
  Filled 2016-05-16: qty 10

## 2016-05-16 NOTE — Progress Notes (Signed)
Patient Care Team: No Pcp Per Patient as PCP - General (General Practice)  DIAGNOSIS:  Encounter Diagnosis  Name Primary?  . 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, unspecified estrogen receptor status ([place])     SUMMARY OF ONCOLOGIC HISTORY:   Bilateral breast cancer ([place])   08/16/2015 Mammogram    Left breast LIQ 4.2 cm, 4 mm oval mass 12:00, 1.2 cm mass left UOQ, enlarged left axillary lymph node      08/16/2015 Mammogram    Right breast UIQ: 3.9 cm irregular mass, 1.5 cm mass in the right LOQ, 9 mm mass right UOQ, 6 mm mass right LIQ, enlarged lymph node Rt axilla      08/20/2015 Initial Diagnosis    Right breast biopsy 1:00: IDC grade 2 ER 95%, PR 2%, Ki 67 15%; HER-2 negative ratio 1.04; 10:00: IDC grade 2-3 dissimilar to 1:00 biopsy 10:00: ER 95%, PR 0%, K67: 25%, HER-2 positive ratio 6.42 right axillary lymph node positive for metastatic cancer      08/20/2015 Procedure    Left biopsy 8:00: IDC with DCIS, grade 2; ER 95%, PR 95%, Ki-67 30%, HER-2 negative ratio 1.2; 12:00: IDC with DCIS, grade 1 ER 95%, PR 95%, Ki-67 10% HER-2 negative ratio 1.14 not similar to this biopsy, left axillary lymph node positive,       09/13/2015 - 01/17/2016 Neo-Adjuvant Chemotherapy    Neoadjuvant dose dense Adriamycin and Cytoxan followed by Taxol Herceptin Perjeta, followed by Herceptin maintenance      01/21/2016 Breast MRI    Bilateral breast masses significantly decreased in size with mild residual NME, complete resolution of numerous other nodules in both breasts, bilateral axillary lymph nodes and bilateral retropectoral lymph nodes decreased in size      02/11/2016 Surgery    Right mastectomy: IDC 2 nodules, 2.5, 1 cm, margins negative, 3/7 lymph nodes positive T2 N1a stage II B, ER 95%, PR 2%, HER-2 negative, Ki-67 15% RCB burden 3.1 and 2.9 class II      02/11/2016 Surgery    Left mastectomy: IDC 3.1 cm with DCIS, margins negative, 0/8 lymph nodes,  T2 N0 stage II a, RCB burden 2.18, class II, ER 100%, PR 95%, HER-2 negative ratio 1.2, Ki-67 30%       04/07/2016 - 05/15/2016 Radiation Therapy    Adjuvant radiation       CHIEF COMPLIANT: Herceptin maintenance   INTERVAL HISTORY: Felicia [name] is a 56 year old with above-mentioned history of bilateral mastectomies and the is currently on adjuvant radiation. She has 7 more days of radiation. She has profound radiation dermatitis. She complains of the port is bothering her. Especially when they axis it and when the flush it. It does not hurt during Herceptin infusion.  REVIEW OF SYSTEMS:   Constitutional: Denies fevers, chills or abnormal weight loss Eyes: Denies blurriness of vision Ears, nose, mouth, throat, and face: Denies mucositis or sore throat Respiratory: Denies cough, dyspnea or wheezes Cardiovascular: Denies palpitation, chest discomfort Gastrointestinal:  Denies nausea, heartburn or change in bowel habits Skin: Denies abnormal skin rashes Lymphatics: Denies new lymphadenopathy or easy bruising Neurological:Denies numbness, tingling or new weaknesses Behavioral/Psych: Mood is stable, no new changes  Extremities: No lower extremity edema  All other systems were reviewed with the patient and are negative.  I have reviewed the past medical history, past surgical history, social history and family history with the patient and they are unchanged from previous note.  ALLERGIES:  is allergic to no  known allergies.  MEDICATIONS:  Current Outpatient Prescriptions  Medication Sig Dispense Refill  . gabapentin (NEURONTIN) 100 MG capsule Take 1 capsule '100mg'$  once at bedtime daily 30 capsule 3  . hyaluronate sodium (RADIAPLEXRX) GEL Apply 1 application topically 2 (two) times daily.    Marland Kitchen lidocaine-prilocaine (EMLA) cream Apply 1 application topically as needed (Apply 2- hours prior to access of port). 30 g 1  . lisinopril (PRINIVIL,ZESTRIL) 10 MG tablet Take 10 mg by mouth  daily.  0  . LORazepam (ATIVAN) 0.5 MG tablet Take 0.5 mg by mouth every 6 (six) hours as needed for anxiety.    . naproxen (NAPROSYN) 500 MG tablet Take 500 mg by mouth 2 (two) times daily as needed (for pain.).     Marland Kitchen non-metallic deodorant (ALRA) MISC Apply 1 application topically daily as needed.    Marland Kitchen omeprazole (PRILOSEC) 20 MG capsule Take 1 capsule (20 mg total) by mouth daily. 30 capsule 0  . prochlorperazine (COMPAZINE) 10 MG tablet Take 10 mg by mouth every 6 (six) hours as needed for nausea or vomiting.    . venlafaxine (EFFEXOR) 37.5 MG tablet Take 1 tablet (37.5 mg total) by mouth 2 (two) times daily. 60 tablet 0   No current facility-administered medications for this visit.     PHYSICAL EXAMINATION: ECOG PERFORMANCE STATUS: 1 - Symptomatic but completely ambulatory  Vitals:   05/16/16 1129  BP: (!) 154/96  Pulse: (!) 58  Resp: 18  Temp: 97.4 F (36.3 C)   Filed Weights   05/16/16 1129  Weight: 216 lb 6.4 oz (98.2 kg)    GENERAL:alert, no distress and comfortable SKIN: skin color, texture, turgor are normal, no rashes or significant lesions EYES: normal, Conjunctiva are pink and non-injected, sclera clear OROPHARYNX:no exudate, no erythema and lips, buccal mucosa, and tongue normal  NECK: supple, thyroid normal size, non-tender, without nodularity LYMPH:  no palpable lymphadenopathy in the cervical, axillary or inguinal LUNGS: clear to auscultation and percussion with normal breathing effort HEART: regular rate & rhythm and no murmurs and no lower extremity edema ABDOMEN:abdomen soft, non-tender and normal bowel sounds MUSCULOSKELETAL:no cyanosis of digits and no clubbing  NEURO: alert & oriented x 3 with fluent speech, no focal motor/sensory deficits EXTREMITIES: No lower extremity edema  LABORATORY DATA:  I have reviewed the data as listed   Chemistry      Component Value Date/Time   NA 139 05/16/2016 1031   K 3.9 05/16/2016 1031   CL 107 02/12/2016 0606     CO2 24 05/16/2016 1031   BUN 11.2 05/16/2016 1031   CREATININE 0.6 05/16/2016 1031      Component Value Date/Time   CALCIUM 9.6 05/16/2016 1031   ALKPHOS 94 05/16/2016 1031   AST 22 05/16/2016 1031   ALT 24 05/16/2016 1031   BILITOT 0.57 05/16/2016 1031     forgot summaries treatment and in no   Lab Results  Component Value Date   WBC 6.1 05/16/2016   HGB 14.0 05/16/2016   HCT 41.8 05/16/2016   MCV 82.1 05/16/2016   PLT 178 05/16/2016   NEUTROABS 4.4 05/16/2016    ASSESSMENT & PLAN:  Bilateral breast cancer ([place]) Treatment Summary: 1. Neoadjuvant chemotherapy with either AC followed by THP started 09/13/2015 and completed 12 weeks of Taxol 11/29/2015, Herceptin Perjeta 6; now on Herceptin  2. Followed by bilateral mastectomies and lymph node surgery 02/11/16 3. Adjuvant radiation 04/07/2016 to 05/15/2016 4. Followed by adjuvant antiestrogen therapy 5. 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 She will take it for 2 more months and then stop it because she does not think it is helping her significantly.  Plan: Adjuvant antiestrogen therapy with letrozole 2.5 mg daily will start in 6 weeks  Return to clinic to see me every 6 weeks and every 3 weeks for Herceptin   I spent 25 minutes talking to the patient of which more than half was spent in counseling and coordination of care.  No orders of the defined types were placed in this encounter.  The patient has a good understanding of the overall plan. she agrees with it.  she will call with any problems that may develop before the next visit here.   Rulon Eisenmenger, MD 05/16/16

## 2016-05-16 NOTE — Patient Instructions (Signed)
[facility] Discharge Instructions for Patients Receiving Chemotherapy  Today you received the following chemotherapy agents: Herceptin   To help prevent nausea and vomiting after your treatment, we encourage you to take your nausea medication as directed.    If you develop nausea and vomiting that is not controlled by your nausea medication, call the clinic.   BELOW ARE SYMPTOMS THAT SHOULD BE REPORTED IMMEDIATELY:  *FEVER GREATER THAN 100.5 F  *CHILLS WITH OR WITHOUT FEVER  NAUSEA AND VOMITING THAT IS NOT CONTROLLED WITH YOUR NAUSEA MEDICATION  *UNUSUAL SHORTNESS OF BREATH  *UNUSUAL BRUISING OR BLEEDING  TENDERNESS IN MOUTH AND THROAT WITH OR WITHOUT PRESENCE OF ULCERS  *URINARY PROBLEMS  *BOWEL PROBLEMS  UNUSUAL RASH Items with * indicate a potential emergency and should be followed up as soon as possible.  Feel free to call the clinic you have any questions or concerns. The clinic phone number is [phone].  Please show the CHEMO ALERT CARD at check-in to the Emergency Department and triage nurse.   

## 2016-05-16 NOTE — Assessment & Plan Note (Signed)
Treatment Summary: 1. Neoadjuvant chemotherapy with either AC followed by THP started 09/13/2015 and completed 12 weeks of Taxol 11/29/2015, Herceptin Perjeta 6; now on Herceptin  2. Followed by bilateral mastectomies and lymph node surgery 02/11/16 3. Adjuvant radiation 04/07/2016 to 05/15/2016 4. Followed by adjuvant antiestrogen therapy 5. 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  Plan: Adjuvant antiestrogen therapy with letrozole 2.5 mg daily Letrozole counseling:We discussed the risks and benefits of anti-estrogen therapy with aromatase inhibitors. These include but not limited to insomnia, hot flashes, mood changes, vaginal dryness, bone density loss, and weight gain. We strongly believe that the benefits far outweigh the risks. Patient understands these risks and consented to starting treatment. Planned treatment duration is 5 years.   Return to clinic to see me every 6 weeks and every 3 weeks for Herceptin

## 2016-05-16 NOTE — Progress Notes (Signed)
Weekly rad tx  Breast right and left chest wall, 26/33 completed,  interpreter julie with patient,  hyperpigmentation and 2 small areas dry desquamation, and now stated slight drainage on left chest wall,  Skin area larger breakdown, pink in color, gave telfa pads to use  For protection there  using neosprin on those 2 areas, and radiaplex on rest of /chest wall , aappetie good BP (!) 174/74 (BP Location: Left Leg, Patient Position: Sitting, Cuff Size: Normal)   Pulse (!) 53   Temp 97.8 F (36.6 C) (Oral)   Resp 20   Wt 214 lb 12.8 oz (97.4 kg)   BMI 43.38 kg/m   Wt Readings from Last 3 Encounters:  05/16/16 214 lb 12.8 oz (97.4 kg)  05/09/16 215 lb 3.2 oz (97.6 kg)  05/01/16 215 lb 12.8 oz (97.9 kg)

## 2016-05-16 NOTE — Progress Notes (Unsigned)
Pt ok to be treated with herceptin with echo from [facility]. 2017. Pt scheduled for echo, end of January. Notified infusion nurse.

## 2016-05-16 NOTE — Progress Notes (Signed)
Department of Radiation Oncology  Phone:  [phone] Fax:        [phone]  Weekly Treatment Note    Name: Felicia [name] Date: 05/16/2016 MRN: [id number] DOB: 04-14-1961   Diagnosis:     ICD-9-CM ICD-10-CM   1. Malignant neoplasm of overlapping sites of both breasts in female, estrogen receptor positive ([place]) 174.8 C50.811    V86.0 C50.812     Z17.0      Current dose: 46.8 Gy  Current fraction: 26   MEDICATIONS: Current Outpatient Prescriptions  Medication Sig Dispense Refill  . gabapentin (NEURONTIN) 100 MG capsule Take 1 capsule 100mg  once at bedtime daily 30 capsule 3  . hyaluronate sodium (RADIAPLEXRX) GEL Apply 1 application topically 2 (two) times daily.    Marland Kitchen lidocaine-prilocaine (EMLA) cream Apply 1 application topically as needed (Apply 2- hours prior to access of port). 30 g 1  . lisinopril (PRINIVIL,ZESTRIL) 10 MG tablet Take 10 mg by mouth daily.  0  . LORazepam (ATIVAN) 0.5 MG tablet Take 0.5 mg by mouth every 6 (six) hours as needed for anxiety.    . naproxen (NAPROSYN) 500 MG tablet Take 500 mg by mouth 2 (two) times daily as needed (for pain.).     Marland Kitchen non-metallic deodorant (ALRA) MISC Apply 1 application topically daily as needed.    Marland Kitchen omeprazole (PRILOSEC) 20 MG capsule Take 1 capsule (20 mg total) by mouth daily. 30 capsule 0  . prochlorperazine (COMPAZINE) 10 MG tablet Take 10 mg by mouth every 6 (six) hours as needed for nausea or vomiting.    . venlafaxine (EFFEXOR) 37.5 MG tablet Take 1 tablet (37.5 mg total) by mouth 2 (two) times daily. 60 tablet 0  . potassium chloride SA (K-DUR,KLOR-CON) 20 MEQ tablet Take 1 tablet (20 mEq total) by mouth once. 30 tablet 0   No current facility-administered medications for this encounter.      ALLERGIES: No known allergies   LABORATORY DATA:  Lab Results  Component Value Date   WBC 6.3 04/03/2016   HGB 13.5 04/03/2016   HCT 40.2 04/03/2016   MCV 83.2 04/03/2016   PLT 189 04/03/2016   Lab  Results  Component Value Date   NA 139 04/03/2016   K 3.9 04/03/2016   CL 107 02/12/2016   CO2 24 04/03/2016   Lab Results  Component Value Date   ALT 32 04/03/2016   AST 30 04/03/2016   ALKPHOS 86 04/03/2016   BILITOT 0.59 04/03/2016     NARRATIVE: Felicia [name] was seen today for weekly treatment management. The chart was checked and the patient's films were reviewed.  Weekly rad tx  Breast right and left chest wall, 26/33 completed,  interpreter julie with patient,  hyperpigmentation and 2 small areas dry desquamation, and now stated slight drainage on left chest wall,  Skin area larger breakdown, pink in color, gave telfa pads to use  For protection there  using neosprin on those 2 areas, and radiaplex on rest of /chest wall , aappetie good BP (!) 174/74 (BP Location: Left Leg, Patient Position: Sitting, Cuff Size: Normal)   Pulse (!) 53   Temp 97.8 F (36.6 C) (Oral)   Resp 20   Wt 214 lb 12.8 oz (97.4 kg)   BMI 43.38 kg/m   Wt Readings from Last 3 Encounters:  05/16/16 214 lb 12.8 oz (97.4 kg)  05/09/16 215 lb 3.2 oz (97.6 kg)  05/01/16 215 lb 12.8 oz (97.9 kg)      PHYSICAL EXAMINATION:  weight is 214 lb 12.8 oz (97.4 kg). Her oral temperature is 97.8 F (36.6 C). Her blood pressure is 174/74 (abnormal) and her pulse is 53 (abnormal). Her respiration is 20.      The patient's skin shows some hyperpigmentation in the treatment area. The area of greatest irritation continues to be at the lateral aspect of the left mastectomy scar. No active drainage from this site currently on exam but the patient states that she has noticed a little of this over the last week.  ASSESSMENT: The patient is doing satisfactorily with treatment.  PLAN: We will continue with the patient's radiation treatment as planned.

## 2016-05-19 ENCOUNTER — Ambulatory Visit: Payer: Self-pay

## 2016-05-19 ENCOUNTER — Ambulatory Visit
Admission: RE | Admit: 2016-05-19 | Discharge: 2016-05-19 | Disposition: A | Discharge status: Home / Self Care | Payer: Self-pay | Source: Ambulatory Visit | Attending: Radiation Oncology | Admitting: Radiation Oncology

## 2016-05-19 DIAGNOSIS — C50011 Malignant neoplasm of nipple and areola, right female breast: Secondary | ICD-10-CM

## 2016-05-19 DIAGNOSIS — C50012 Malignant neoplasm of nipple and areola, left female breast: Principal | ICD-10-CM

## 2016-05-19 MED ORDER — RADIAPLEXRX EX GEL
Freq: Once | CUTANEOUS | Status: AC
  Administered 2016-05-19: 10:00:00 via TOPICAL

## 2016-05-19 NOTE — Progress Notes (Signed)
0940 Came around after radiation requested a new tube of Radiaplex gel new tube given.

## 2016-05-20 ENCOUNTER — Ambulatory Visit: Payer: Self-pay

## 2016-05-20 ENCOUNTER — Ambulatory Visit
Admission: RE | Admit: 2016-05-20 | Discharge: 2016-05-20 | Disposition: A | Discharge status: Home / Self Care | Payer: Self-pay | Source: Ambulatory Visit | Attending: Radiation Oncology | Admitting: Radiation Oncology

## 2016-05-21 ENCOUNTER — Ambulatory Visit: Payer: Self-pay

## 2016-05-21 ENCOUNTER — Ambulatory Visit
Admission: RE | Admit: 2016-05-21 | Discharge: 2016-05-21 | Disposition: A | Discharge status: Home / Self Care | Payer: Self-pay | Source: Ambulatory Visit | Attending: Radiation Oncology | Admitting: Radiation Oncology

## 2016-05-21 ENCOUNTER — Encounter: Payer: Self-pay | Admitting: Hematology and Oncology

## 2016-05-21 NOTE — Progress Notes (Signed)
Patient came in to bring approval letter from customer service regarding one of her accounts. Advised that didn't apply to her balance here per the letter, but I would reach out to the manager(Shannon N) in customer service to discuss further. Emailed Larene Beach for details.

## 2016-05-22 ENCOUNTER — Ambulatory Visit: Payer: Self-pay

## 2016-05-22 ENCOUNTER — Ambulatory Visit
Admission: RE | Admit: 2016-05-22 | Discharge: 2016-05-22 | Disposition: A | Discharge status: Home / Self Care | Payer: Self-pay | Source: Ambulatory Visit | Attending: Radiation Oncology | Admitting: Radiation Oncology

## 2016-05-23 ENCOUNTER — Encounter: Payer: Self-pay | Admitting: Radiation Oncology

## 2016-05-23 ENCOUNTER — Ambulatory Visit: Payer: Self-pay

## 2016-05-23 ENCOUNTER — Ambulatory Visit
Admission: RE | Admit: 2016-05-23 | Discharge: 2016-05-23 | Disposition: A | Discharge status: Home / Self Care | Payer: Self-pay | Source: Ambulatory Visit | Attending: Radiation Oncology | Admitting: Radiation Oncology

## 2016-05-23 VITALS — BP 170/99 | HR 57 | Temp 97.6°F | Ht 59.0 in | Wt 214.4 lb

## 2016-05-23 DIAGNOSIS — C50011 Malignant neoplasm of nipple and areola, right female breast: Secondary | ICD-10-CM

## 2016-05-23 DIAGNOSIS — Z170 Estrogen receptor positive status [ER+]: Secondary | ICD-10-CM | POA: Insufficient documentation

## 2016-05-23 DIAGNOSIS — C50812 Malignant neoplasm of overlapping sites of left female breast: Secondary | ICD-10-CM

## 2016-05-23 DIAGNOSIS — C50811 Malignant neoplasm of overlapping sites of right female breast: Secondary | ICD-10-CM | POA: Insufficient documentation

## 2016-05-23 DIAGNOSIS — C50012 Malignant neoplasm of nipple and areola, left female breast: Secondary | ICD-10-CM

## 2016-05-23 MED ORDER — RADIAPLEXRX EX GEL
Freq: Once | CUTANEOUS | Status: AC
  Administered 2016-05-23: 10:00:00 via TOPICAL

## 2016-05-23 NOTE — Progress Notes (Addendum)
Felicia [name] has completed 31 fractions to her left and right chest wall.  She reports having pain at a 8/10 in her chest.  She is not taking any pain medication.  She asked if she can take Extra Strength Tylenol.  She reports having more pain in her right arm that started yesterday.  She said it feels like needles are stabbing her in her right underarm area.  She is using radiaplex and neosporin on the open areas.  The skin on her chest is red with hyperpigmentation.  She has 2 areas of desquamation along her scars on both sides.  She said they are draining yellow fluid.  She has been given a one month follow up appointment.  Her bp was elevated at 170/99 and was taken from her left lower leg.  Patient was given a refill on radiaplex and also hydrogel pads.  BP (!) 170/99 (BP Location: Left Leg, Patient Position: Sitting)   Pulse (!) 57   Temp 97.6 F (36.4 C) (Oral)   Ht 4\' 11"  (1.499 m)   Wt 214 lb 6.4 oz (97.3 kg)   SpO2 98%   BMI 43.30 kg/m    Wt Readings from Last 3 Encounters:  05/23/16 214 lb 6.4 oz (97.3 kg)  05/16/16 214 lb 12.8 oz (97.4 kg)  05/16/16 216 lb 6.4 oz (98.2 kg)

## 2016-05-23 NOTE — Progress Notes (Signed)
Department of Radiation Oncology  Phone:  [phone] Fax:        [phone]  Weekly Treatment Note    Name: Felicia [name] Date: 05/23/2016 MRN: [id number] DOB: Feb 16, 1961   Diagnosis:     ICD-9-CM ICD-10-CM   1. Bilateral malignant neoplasm of areola of breast in female, unspecified estrogen receptor status ([place]) 174.0 C50.011 hyaluronate sodium (RADIAPLEXRX) gel    C50.012   2. Malignant neoplasm of overlapping sites of both breasts in female, estrogen receptor positive ([place]) 174.8 C50.811    V86.0 C50.812     Z17.0      Current dose: 56.4 Gy  Current fraction: 31   MEDICATIONS: Current Outpatient Prescriptions  Medication Sig Dispense Refill  . gabapentin (NEURONTIN) 100 MG capsule Take 1 capsule 100mg  once at bedtime daily 30 capsule 3  . hyaluronate sodium (RADIAPLEXRX) GEL Apply 1 application topically 2 (two) times daily.    Marland Kitchen lidocaine-prilocaine (EMLA) cream Apply 1 application topically as needed (Apply 2- hours prior to access of port). 30 g 1  . lisinopril (PRINIVIL,ZESTRIL) 10 MG tablet Take 10 mg by mouth daily.  0  . LORazepam (ATIVAN) 0.5 MG tablet Take 0.5 mg by mouth every 6 (six) hours as needed for anxiety.    . naproxen (NAPROSYN) 500 MG tablet Take 500 mg by mouth 2 (two) times daily as needed (for pain.).     Marland Kitchen non-metallic deodorant (ALRA) MISC Apply 1 application topically daily as needed.    Marland Kitchen omeprazole (PRILOSEC) 20 MG capsule Take 1 capsule (20 mg total) by mouth daily. 30 capsule 0  . prochlorperazine (COMPAZINE) 10 MG tablet Take 10 mg by mouth every 6 (six) hours as needed for nausea or vomiting.    . venlafaxine (EFFEXOR) 37.5 MG tablet Take 1 tablet (37.5 mg total) by mouth 2 (two) times daily. 60 tablet 0   No current facility-administered medications for this encounter.      ALLERGIES: No known allergies   LABORATORY DATA:  Lab Results  Component Value Date   WBC 6.1 05/16/2016   HGB 14.0 05/16/2016   HCT 41.8 05/16/2016     MCV 82.1 05/16/2016   PLT 178 05/16/2016   Lab Results  Component Value Date   NA 139 05/16/2016   K 3.9 05/16/2016   CL 107 02/12/2016   CO2 24 05/16/2016   Lab Results  Component Value Date   ALT 24 05/16/2016   AST 22 05/16/2016   ALKPHOS 94 05/16/2016   BILITOT 0.57 05/16/2016     NARRATIVE: Felicia [name] was seen today for weekly treatment management. The chart was checked and the patient's films were reviewed.  The patient has some increased skin irritation. Continued soreness in the supraclavicular regions although we discussed that we are not continuing to treat these areas and this should improve same. She also has some pain and tightness in the right shoulder region extending down the arm. This is worse after treatment.  PHYSICAL EXAMINATION: height is 4\' 11"  (1.499 m) and weight is 214 lb 6.4 oz (97.3 kg). Her oral temperature is 97.6 F (36.4 C). Her blood pressure is 170/99 (abnormal) and her pulse is 57 (abnormal). Her oxygen saturation is 98%.      Areas of increased hyperpigmentation with some desquamation, the largest area again at the lateral aspect of her left mastectomy scar. She will continue apply Neosporin to this area.  ASSESSMENT: The patient is doing satisfactorily with treatment.  PLAN: We will continue with the patient's  radiation treatment as planned.   The patient will continue her current skin care.   I expect that her skin showed markedly increased within the next couple of weeks. The patient will be seen in one month for follow-up.

## 2016-05-23 NOTE — Progress Notes (Signed)
  Radiation Oncology         [phone] ________________________________  Name: Felicia [name] MRN: [id number]  Date: 05/14/2016  DOB: 09/11/1960  Complex simulation note  The patient has undergone complex simulation for her upcoming boost treatment for her diagnosis of bilateral breast cancer. The patient has initially been planned to receive 50.4 Gy to the chest wall bilaterally. The patient will now receive a 10 Gy boost to each mastectomy scar which has been identified clinically. This will be accomplished using 2 en face electron fields: 1 for each incision  . Based on the depth of the target area, 9 MeV electrons will be used for the right side and 6 MeV will be used for the left side. The patient's final total dose therefore will be 60.4 Gy for each target site. A special port plan is requested for the boost treatments.   _______________________________  Jodelle Gross, MD, PhD

## 2016-05-24 ENCOUNTER — Ambulatory Visit: Payer: Self-pay

## 2016-05-26 ENCOUNTER — Other Ambulatory Visit (HOSPITAL_COMMUNITY): Payer: Self-pay | Admitting: *Deleted

## 2016-05-26 ENCOUNTER — Ambulatory Visit: Payer: Self-pay

## 2016-05-26 ENCOUNTER — Ambulatory Visit
Admission: RE | Admit: 2016-05-26 | Discharge: 2016-05-26 | Disposition: A | Discharge status: Home / Self Care | Payer: Self-pay | Source: Ambulatory Visit | Attending: Radiation Oncology | Admitting: Radiation Oncology

## 2016-05-26 DIAGNOSIS — C50811 Malignant neoplasm of overlapping sites of right female breast: Secondary | ICD-10-CM

## 2016-05-27 ENCOUNTER — Ambulatory Visit (HOSPITAL_COMMUNITY)
Admission: RE | Admit: 2016-05-27 | Discharge: 2016-05-27 | Disposition: A | Discharge status: Home / Self Care | Payer: Self-pay | Source: Ambulatory Visit | Attending: Cardiology | Admitting: Cardiology

## 2016-05-27 ENCOUNTER — Encounter (HOSPITAL_COMMUNITY): Payer: Self-pay

## 2016-05-27 ENCOUNTER — Ambulatory Visit
Admission: RE | Admit: 2016-05-27 | Discharge: 2016-05-27 | Disposition: A | Discharge status: Home / Self Care | Payer: Self-pay | Source: Ambulatory Visit | Attending: Radiation Oncology | Admitting: Radiation Oncology

## 2016-05-27 ENCOUNTER — Ambulatory Visit (HOSPITAL_BASED_OUTPATIENT_CLINIC_OR_DEPARTMENT_OTHER)
Admission: RE | Admit: 2016-05-27 | Discharge: 2016-05-27 | Disposition: A | Discharge status: Home / Self Care | Payer: Self-pay | Source: Ambulatory Visit

## 2016-05-27 VITALS — BP 143/92 | HR 69 | Wt 217.1 lb

## 2016-05-27 DIAGNOSIS — C50811 Malignant neoplasm of overlapping sites of right female breast: Secondary | ICD-10-CM | POA: Insufficient documentation

## 2016-05-27 DIAGNOSIS — I340 Nonrheumatic mitral (valve) insufficiency: Secondary | ICD-10-CM | POA: Insufficient documentation

## 2016-05-27 DIAGNOSIS — C50011 Malignant neoplasm of nipple and areola, right female breast: Secondary | ICD-10-CM

## 2016-05-27 DIAGNOSIS — C50812 Malignant neoplasm of overlapping sites of left female breast: Secondary | ICD-10-CM

## 2016-05-27 DIAGNOSIS — C50012 Malignant neoplasm of nipple and areola, left female breast: Secondary | ICD-10-CM

## 2016-05-27 DIAGNOSIS — Z170 Estrogen receptor positive status [ER+]: Secondary | ICD-10-CM

## 2016-05-27 LAB — ECHOCARDIOGRAM COMPLETE
E decel time: 204 msec
E/e' ratio: 11.05
FS: 28 % (ref 28–44)
IVS/LV PW RATIO, ED: 0.85
LA ID, A-P, ES: 31 mm
LA diam index: 1.48 cm/m2
LA vol A4C: 43.8 ml
LA vol index: 24.4 mL/m2
LA vol: 51 mL
LDCA: 2.27 cm2
LEFT ATRIUM END SYS DIAM: 31 mm
LV E/e'average: 11.05
LV TDI E'LATERAL: 9.14
LV TDI E'MEDIAL: 7.83
LVEEMED: 11.05
LVELAT: 9.14 cm/s
LVOT VTI: 32.8 cm
LVOT peak grad rest: 8 mmHg
LVOT peak vel: 139 cm/s
LVOTD: 17 mm
LVOTSV: 74 mL
MV Dec: 204
MV pk A vel: 110 m/s
MV pk E vel: 101 m/s
MVPG: 4 mmHg
PW: 11.8 mm — AB (ref 0.6–1.1)
RV LATERAL S' VELOCITY: 12 cm/s
TAPSE: 24 mm
WEIGHTICAEL: 3474 [oz_av]

## 2016-05-27 NOTE — Progress Notes (Signed)
  Echocardiogram 2D Echocardiogram has been performed.  Felicia [name] 05/27/2016, 10:01 AM

## 2016-05-27 NOTE — Patient Instructions (Signed)
Follow up and Echo in 3 months with Dr.McLean  

## 2016-05-27 NOTE — Progress Notes (Signed)
[facility] CONSULT NOTE  Referring Physician: Dr Lindi Adie  HPI: [name] is a 56 year old with history of bilateral breast cancer.     Bilateral breast cancer ([place])   08/16/2015 Mammogram    Left breast LIQ 4.2 cm, 4 mm oval mass 12:00, 1.2 cm mass left UOQ, enlarged left axillary lymph node      08/16/2015 Mammogram    Right breast UIQ: 3.9 cm irregular mass, 1.5 cm mass in the right LOQ, 9 mm mass right UOQ, 6 mm mass right LIQ, enlarged lymph node Rt axilla      08/20/2015 Initial Diagnosis    Right breast biopsy 1:00: IDC grade 2 ER 95%, PR 2%, Ki 67 15%; HER-2 negative ratio 1.04; 10:00: IDC grade 2-3 dissimilar to 1:00 biopsy 10:00: ER 95%, PR 0%, K67: 25%, HER-2 positive ratio 6.42 right axillary lymph node positive for metastatic cancer      08/20/2015 Procedure    Left biopsy 8:00: IDC with DCIS, grade 2; ER 95%, PR 95%, Ki-67 30%, HER-2 negative ratio 1.2; 12:00: IDC with DCIS, grade 1 ER 95%, PR 95%, Ki-67 10% HER-2 negative ratio 1.14 not similar to this biopsy, left axillary lymph node positive,       09/13/2015 - 01/17/2016 Neo-Adjuvant Chemotherapy    Neoadjuvant dose dense Adriamycin and Cytoxan followed by Taxol Herceptin Perjeta, followed by Herceptin maintenance      01/21/2016 Breast MRI    Bilateral breast masses significantly decreased in size with mild residual NME, complete resolution of numerous other nodules in both breasts, bilateral axillary lymph nodes and bilateral retropectoral lymph nodes decreased in size      02/11/2016 Surgery    Right mastectomy: IDC 2 nodules, 2.5, 1 cm, margins negative, 3/7 lymph nodes positive T2 N1a stage II B, ER 95%, PR 2%, HER-2 negative, Ki-67 15% RCB burden 3.1 and 2.9 class II      02/11/2016 Surgery    Left mastectomy: IDC 3.1 cm with DCIS, margins negative, 0/8 lymph nodes, T2 N0 stage II a, RCB burden 2.18, class II, ER 100%, PR 95%, HER-2 negative ratio 1.2, Ki-67  30%       04/07/2016 - 05/15/2016 Radiation Therapy    Adjuvant radiation      Overall feeling ok. No dyspnea or chest pain. She has chest wall radiation dermatitis.   ECHO 07/2015 EF 60-65% ECHO 01/11/2016 EF 60-65%. Lat S'11 GLS -19.3%   ECHO 1/18 EF 60-65%, GLS -21.3%, normal RV size and systolic function.   FH: Great Grandmother - heart disease  Review of Systems: All systems reviewed and negative except as per HPI.   Past Medical History:  Diagnosis Date  . Cancer (HCC)    breast  . GERD (gastroesophageal reflux disease)     Current Outpatient Prescriptions  Medication Sig Dispense Refill  . gabapentin (NEURONTIN) 100 MG capsule Take 1 capsule 127m once at bedtime daily 30 capsule 3  . hyaluronate sodium (RADIAPLEXRX) GEL Apply 1 application topically 2 (two) times daily.    .Marland Kitchenlidocaine-prilocaine (EMLA) cream Apply 1 application topically as needed (Apply 2- hours prior to access of port). 30 g 1  . lisinopril (PRINIVIL,ZESTRIL) 10 MG tablet Take 10 mg by mouth daily.  0  . LORazepam (ATIVAN) 0.5 MG tablet Take 0.5 mg by mouth every 6 (six) hours as needed for anxiety.    . naproxen (NAPROSYN) 500 MG tablet Take 500 mg by mouth 2 (two) times daily as needed (for pain.).     .Marland Kitchen  non-metallic deodorant (ALRA) MISC Apply 1 application topically daily as needed.    Marland Kitchen omeprazole (PRILOSEC) 20 MG capsule Take 1 capsule (20 mg total) by mouth daily. 30 capsule 0  . prochlorperazine (COMPAZINE) 10 MG tablet Take 10 mg by mouth every 6 (six) hours as needed for nausea or vomiting.    . venlafaxine (EFFEXOR) 37.5 MG tablet Take 1 tablet (37.5 mg total) by mouth 2 (two) times daily. 60 tablet 0   No current facility-administered medications for this encounter.     Allergies  Allergen Reactions  . No Known Allergies       Social History   Social History  . Marital status: Married    Spouse name: Felicia [name]  . Number of children: 3  . Years of education:  N/A   Occupational History  . Not on file.   Social History Main Topics  . Smoking status: Never Smoker  . Smokeless tobacco: Never Used  . Alcohol use No  . Drug use: No  . Sexual activity: Yes    Birth control/ protection: Surgical   Other Topics Concern  . Not on file   Social History Narrative   3 children    Felicia [name] age 35, lives in [place]    Felicia [name] age 29 lives in [place]   Felicia [name] age 64 lives in [place]      Family History  Problem Relation Age of Onset  . Diabetes Mother     Vitals:   05/27/16 0952  BP: (!) 143/92  Pulse: 69  SpO2: 100%  Weight: 217 lb 2 oz (98.5 kg)    PHYSICAL EXAM: General:  Well appearing. No respiratory difficulty HEENT: normal Neck: supple. no JVD. Carotids 2+ bilat; no bruits. No lymphadenopathy or thryomegaly appreciated. Cor: PMI nondisplaced. Regular rate & rhythm. No rubs, gallops or murmurs. Lungs: clear Abdomen: soft, nontender, nondistended. No hepatosplenomegaly. No bruits or masses. Good bowel sounds. Extremities: no cyanosis, clubbing, rash, edema Neuro: alert & oriented x 3, cranial nerves grossly intact. moves all 4 extremities w/o difficulty. Affect pleasant.   ASSESSMENT & PLAN:    Patient has bilateral breast cancer. She was treated with Adriamycin/Cytoxan in 5/17, this treatment has been completed.  She was then started on Taxol/Herceptin/Perjeta.  Herceptin to continue a year. I reviewed today's echo: EF and strain parameters stable.  She will continue Herceptin and return for followup office visit and echo in 3 months.   Felicia [name] 05/27/2016

## 2016-05-29 ENCOUNTER — Encounter: Payer: Self-pay | Admitting: Radiation Oncology

## 2016-05-29 NOTE — Progress Notes (Signed)
°  Radiation Oncology         [phone] ________________________________  Name: Felicia [name] MRN: [id number]  Date: 05/29/2016  DOB: 07-Jan-1961  End of Treatment Note  Diagnosis:   Stage IIB of the right breast and Stage IIA of the left breast     Indication for treatment:  Curative       Radiation treatment dates:   04/07/16 - 05/27/16  Site/dose:   Left Chest Wall treated to 50.4 Gy in 28 fractions, and then Boosted an additional 10 Gy in 5 fractions. Right Chest Wall treated to 50.4 Gy in 28 fractions, and then Boosted an additional 10 Gy in 5 fractions.  Beams/energy:   Left Chest Wall : 3D  //  10X, 15X        Left Boost : Depth Dose  //  6MeV        Right Chest Wall : 3D  //  10X, 15X        Right Boost : Depth Dose  //  6MeV  Narrative: The patient tolerated radiation treatment relatively well. She reported some discomfort to the supraclavicular region, as well as pain and tightness in the right shoulder region extending down the arm.  Plan: The patient has completed radiation treatment. She will continue to use Neosporin as needed. The patient will return to radiation oncology clinic for routine followup in one month. I advised them to call or return sooner if they have any questions or concerns related to their recovery or treatment.  ------------------------------------------------  Jodelle Gross, MD, PhD  This document serves as a record of services personally performed by Kyung Rudd, MD. It was created on his behalf by Maryla Morrow, a trained medical scribe. The creation of this record is based on the scribe's personal observations and the provider's statements to them. This document has been checked and approved by the attending provider.

## 2016-06-05 ENCOUNTER — Encounter: Payer: Self-pay | Admitting: *Deleted

## 2016-06-05 ENCOUNTER — Ambulatory Visit (HOSPITAL_BASED_OUTPATIENT_CLINIC_OR_DEPARTMENT_OTHER): Payer: Self-pay

## 2016-06-05 VITALS — BP 157/70 | HR 82 | Temp 97.5°F | Resp 18

## 2016-06-05 DIAGNOSIS — C50812 Malignant neoplasm of overlapping sites of left female breast: Principal | ICD-10-CM

## 2016-06-05 DIAGNOSIS — C50011 Malignant neoplasm of nipple and areola, right female breast: Secondary | ICD-10-CM

## 2016-06-05 DIAGNOSIS — C50112 Malignant neoplasm of central portion of left female breast: Secondary | ICD-10-CM

## 2016-06-05 DIAGNOSIS — Z171 Estrogen receptor negative status [ER-]: Principal | ICD-10-CM

## 2016-06-05 DIAGNOSIS — Z5112 Encounter for antineoplastic immunotherapy: Secondary | ICD-10-CM

## 2016-06-05 DIAGNOSIS — C50811 Malignant neoplasm of overlapping sites of right female breast: Secondary | ICD-10-CM

## 2016-06-05 MED ORDER — ACETAMINOPHEN 325 MG PO TABS
650.0000 mg | ORAL_TABLET | Freq: Once | ORAL | Status: AC
  Administered 2016-06-05: 650 mg via ORAL

## 2016-06-05 MED ORDER — HEPARIN SOD (PORK) LOCK FLUSH 100 UNIT/ML IV SOLN
500.0000 [IU] | Freq: Once | INTRAVENOUS | Status: AC | PRN
  Administered 2016-06-05: 500 [IU]
  Filled 2016-06-05: qty 5

## 2016-06-05 MED ORDER — DIPHENHYDRAMINE HCL 25 MG PO CAPS
50.0000 mg | ORAL_CAPSULE | Freq: Once | ORAL | Status: AC
  Administered 2016-06-05: 50 mg via ORAL

## 2016-06-05 MED ORDER — ACETAMINOPHEN 325 MG PO TABS
ORAL_TABLET | ORAL | Status: AC
  Filled 2016-06-05: qty 2

## 2016-06-05 MED ORDER — SODIUM CHLORIDE 0.9% FLUSH
10.0000 mL | INTRAVENOUS | Status: DC | PRN
  Administered 2016-06-05: 10 mL
  Filled 2016-06-05: qty 10

## 2016-06-05 MED ORDER — DIPHENHYDRAMINE HCL 25 MG PO CAPS
ORAL_CAPSULE | ORAL | Status: AC
  Filled 2016-06-05: qty 2

## 2016-06-05 MED ORDER — SODIUM CHLORIDE 0.9 % IV SOLN
Freq: Once | INTRAVENOUS | Status: AC
  Administered 2016-06-05: 10:00:00 via INTRAVENOUS

## 2016-06-05 MED ORDER — TRASTUZUMAB CHEMO 150 MG IV SOLR
6.0000 mg/kg | Freq: Once | INTRAVENOUS | Status: AC
  Administered 2016-06-05: 588 mg via INTRAVENOUS
  Filled 2016-06-05: qty 28

## 2016-06-05 NOTE — Patient Instructions (Signed)
[facility] Discharge Instructions for Patients Receiving Chemotherapy  Today you received the following chemotherapy agents: Herceptin   To help prevent nausea and vomiting after your treatment, we encourage you to take your nausea medication as directed.    If you develop nausea and vomiting that is not controlled by your nausea medication, call the clinic.   BELOW ARE SYMPTOMS THAT SHOULD BE REPORTED IMMEDIATELY:  *FEVER GREATER THAN 100.5 F  *CHILLS WITH OR WITHOUT FEVER  NAUSEA AND VOMITING THAT IS NOT CONTROLLED WITH YOUR NAUSEA MEDICATION  *UNUSUAL SHORTNESS OF BREATH  *UNUSUAL BRUISING OR BLEEDING  TENDERNESS IN MOUTH AND THROAT WITH OR WITHOUT PRESENCE OF ULCERS  *URINARY PROBLEMS  *BOWEL PROBLEMS  UNUSUAL RASH Items with * indicate a potential emergency and should be followed up as soon as possible.  Feel free to call the clinic you have any questions or concerns. The clinic phone number is [phone].  Please show the CHEMO ALERT CARD at check-in to the Emergency Department and triage nurse.   

## 2016-06-25 ENCOUNTER — Other Ambulatory Visit: Payer: Self-pay

## 2016-06-25 DIAGNOSIS — C50011 Malignant neoplasm of nipple and areola, right female breast: Secondary | ICD-10-CM

## 2016-06-25 DIAGNOSIS — C50812 Malignant neoplasm of overlapping sites of left female breast: Principal | ICD-10-CM

## 2016-06-25 DIAGNOSIS — C50811 Malignant neoplasm of overlapping sites of right female breast: Secondary | ICD-10-CM

## 2016-06-25 DIAGNOSIS — C50012 Malignant neoplasm of nipple and areola, left female breast: Secondary | ICD-10-CM

## 2016-06-25 DIAGNOSIS — Z170 Estrogen receptor positive status [ER+]: Principal | ICD-10-CM

## 2016-06-26 ENCOUNTER — Ambulatory Visit: Payer: Self-pay

## 2016-06-26 ENCOUNTER — Other Ambulatory Visit (HOSPITAL_BASED_OUTPATIENT_CLINIC_OR_DEPARTMENT_OTHER): Payer: Self-pay

## 2016-06-26 ENCOUNTER — Encounter: Payer: Self-pay | Admitting: Hematology and Oncology

## 2016-06-26 ENCOUNTER — Ambulatory Visit (HOSPITAL_BASED_OUTPATIENT_CLINIC_OR_DEPARTMENT_OTHER): Payer: Self-pay | Admitting: Hematology and Oncology

## 2016-06-26 ENCOUNTER — Ambulatory Visit (HOSPITAL_BASED_OUTPATIENT_CLINIC_OR_DEPARTMENT_OTHER): Payer: Self-pay

## 2016-06-26 VITALS — BP 162/73 | HR 63 | Temp 97.4°F | Resp 18 | Ht 59.0 in | Wt 216.0 lb

## 2016-06-26 DIAGNOSIS — C50012 Malignant neoplasm of nipple and areola, left female breast: Secondary | ICD-10-CM

## 2016-06-26 DIAGNOSIS — Z95828 Presence of other vascular implants and grafts: Secondary | ICD-10-CM

## 2016-06-26 DIAGNOSIS — C50011 Malignant neoplasm of nipple and areola, right female breast: Secondary | ICD-10-CM

## 2016-06-26 DIAGNOSIS — Z170 Estrogen receptor positive status [ER+]: Principal | ICD-10-CM

## 2016-06-26 DIAGNOSIS — C50811 Malignant neoplasm of overlapping sites of right female breast: Secondary | ICD-10-CM

## 2016-06-26 DIAGNOSIS — Z171 Estrogen receptor negative status [ER-]: Principal | ICD-10-CM

## 2016-06-26 DIAGNOSIS — Z780 Asymptomatic menopausal state: Secondary | ICD-10-CM

## 2016-06-26 DIAGNOSIS — G620 Drug-induced polyneuropathy: Secondary | ICD-10-CM

## 2016-06-26 DIAGNOSIS — C50812 Malignant neoplasm of overlapping sites of left female breast: Principal | ICD-10-CM

## 2016-06-26 DIAGNOSIS — Z5112 Encounter for antineoplastic immunotherapy: Secondary | ICD-10-CM

## 2016-06-26 LAB — CBC WITH DIFFERENTIAL/PLATELET
BASO%: 0.3 % (ref 0.0–2.0)
Basophils Absolute: 0 10*3/uL (ref 0.0–0.1)
EOS%: 1 % (ref 0.0–7.0)
Eosinophils Absolute: 0.1 10*3/uL (ref 0.0–0.5)
HCT: 40.1 % (ref 34.8–46.6)
HGB: 13.7 g/dL (ref 11.6–15.9)
LYMPH%: 21.2 % (ref 14.0–49.7)
MCH: 28.2 pg (ref 25.1–34.0)
MCHC: 34.2 g/dL (ref 31.5–36.0)
MCV: 82.5 fL (ref 79.5–101.0)
MONO#: 0.5 10*3/uL (ref 0.1–0.9)
MONO%: 7.1 % (ref 0.0–14.0)
NEUT#: 5 10*3/uL (ref 1.5–6.5)
NEUT%: 70.4 % (ref 38.4–76.8)
Platelets: 168 10*3/uL (ref 145–400)
RBC: 4.86 10*6/uL (ref 3.70–5.45)
RDW: 14 % (ref 11.2–14.5)
WBC: 7.1 10*3/uL (ref 3.9–10.3)
lymph#: 1.5 10*3/uL (ref 0.9–3.3)
nRBC: 0 % (ref 0–0)

## 2016-06-26 LAB — COMPREHENSIVE METABOLIC PANEL
ALT: 27 U/L (ref 0–55)
AST: 26 U/L (ref 5–34)
Albumin: 3.9 g/dL (ref 3.5–5.0)
Alkaline Phosphatase: 91 U/L (ref 40–150)
Anion Gap: 7 mEq/L (ref 3–11)
BUN: 11.2 mg/dL (ref 7.0–26.0)
CO2: 26 mEq/L (ref 22–29)
Calcium: 9.7 mg/dL (ref 8.4–10.4)
Chloride: 107 mEq/L (ref 98–109)
Creatinine: 0.7 mg/dL (ref 0.6–1.1)
EGFR: >90 mL/min/{1.73_m2} (ref >90)
Glucose: 84 mg/dl (ref 70–140)
Potassium: 3.9 mEq/L (ref 3.5–5.1)
Sodium: 140 mEq/L (ref 136–145)
Total Bilirubin: 0.72 mg/dL (ref 0.20–1.20)
Total Protein: 7.2 g/dL (ref 6.4–8.3)

## 2016-06-26 MED ORDER — LETROZOLE 2.5 MG PO TABS: 2.5000 mg | ORAL_TABLET | Freq: Every day | ORAL | 3 refills | Status: DC

## 2016-06-26 MED ORDER — DIPHENHYDRAMINE HCL 25 MG PO CAPS
50.0000 mg | ORAL_CAPSULE | Freq: Once | ORAL | Status: AC
Start: 2016-06-26 — End: 2016-06-26
  Administered 2016-06-26: 50 mg via ORAL

## 2016-06-26 MED ORDER — ACETAMINOPHEN 325 MG PO TABS
ORAL_TABLET | ORAL | Status: AC
  Filled 2016-06-26: qty 2

## 2016-06-26 MED ORDER — TRASTUZUMAB CHEMO 150 MG IV SOLR
6.0000 mg/kg | Freq: Once | INTRAVENOUS | Status: AC
  Administered 2016-06-26: 588 mg via INTRAVENOUS
  Filled 2016-06-26: qty 28

## 2016-06-26 MED ORDER — SODIUM CHLORIDE 0.9% FLUSH
10.0000 mL | INTRAVENOUS | Status: DC | PRN
  Administered 2016-06-26: 10 mL via INTRAVENOUS
  Filled 2016-06-26: qty 10

## 2016-06-26 MED ORDER — DIPHENHYDRAMINE HCL 25 MG PO CAPS
ORAL_CAPSULE | ORAL | Status: AC
  Filled 2016-06-26: qty 2

## 2016-06-26 MED ORDER — ACETAMINOPHEN 325 MG PO TABS
650.0000 mg | ORAL_TABLET | Freq: Once | ORAL | Status: AC
  Administered 2016-06-26: 650 mg via ORAL

## 2016-06-26 MED ORDER — SODIUM CHLORIDE 0.9 % IV SOLN
Freq: Once | INTRAVENOUS | Status: AC
  Administered 2016-06-26: 12:00:00 via INTRAVENOUS

## 2016-06-26 NOTE — Assessment & Plan Note (Signed)
Treatment Summary: 1. Neoadjuvant chemotherapy with either AC followed by THP started 09/13/2015 and completed 12 weeks of Taxol 11/29/2015, Herceptin Perjeta 6; now on Herceptin  2. Followed by bilateral mastectomies and lymph node surgery 02/11/16 3. Adjuvant radiation 04/07/2016 to 05/15/2016 4. Followed by adjuvant antiestrogen therapy 5. 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 She will taper it off and discontinue. Current treatment:  1. Adjuvant antiestrogen therapy with letrozole 2.5 mg daily started 06/26/2016 2. Herceptin maintenance will complete May 2018 After Herceptin is complete, she will be candidate for Neratinib  Return to clinic to see me every 6 weeks and every 3 weeks for Herceptin

## 2016-06-26 NOTE — Progress Notes (Signed)
Patient Care Team: No Pcp Per Patient as PCP - General (General Practice)  DIAGNOSIS:  Encounter Diagnoses  Name Primary?  . 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 Post-menopausal Yes    SUMMARY OF ONCOLOGIC HISTORY:   Bilateral breast cancer (Felicia [name])   08/16/2015 Mammogram    Left breast LIQ 4.2 cm, 4 mm oval mass 12:00, 1.2 cm mass left UOQ, enlarged left axillary lymph node      08/16/2015 Mammogram    Right breast UIQ: 3.9 cm irregular mass, 1.5 cm mass in the right LOQ, 9 mm mass right UOQ, 6 mm mass right LIQ, enlarged lymph node Rt axilla      08/20/2015 Initial Diagnosis    Right breast biopsy 1:00: IDC grade 2 ER 95%, PR 2%, Ki 67 15%; HER-2 negative ratio 1.04; 10:00: IDC grade 2-3 dissimilar to 1:00 biopsy 10:00: ER 95%, PR 0%, K67: 25%, HER-2 positive ratio 6.42 right axillary lymph node positive for metastatic cancer      08/20/2015 Procedure    Left biopsy 8:00: IDC with DCIS, grade 2; ER 95%, PR 95%, Ki-67 30%, HER-2 negative ratio 1.2; 12:00: IDC with DCIS, grade 1 ER 95%, PR 95%, Ki-67 10% HER-2 negative ratio 1.14 not similar to this biopsy, left axillary lymph node positive,       09/13/2015 - 01/17/2016 Neo-Adjuvant Chemotherapy    Neoadjuvant dose dense Adriamycin and Cytoxan followed by Taxol Herceptin Perjeta, followed by Herceptin maintenance      01/21/2016 Breast MRI    Bilateral breast masses significantly decreased in size with mild residual NME, complete resolution of numerous other nodules in both breasts, bilateral axillary lymph nodes and bilateral retropectoral lymph nodes decreased in size      02/11/2016 Surgery    Right mastectomy: IDC 2 nodules, 2.5, 1 cm, margins negative, 3/7 lymph nodes positive T2 N1a stage II B, ER 95%, PR 2%, HER-2 negative, Ki-67 15% RCB burden 3.1 and 2.9 class II      02/11/2016 Surgery    Left mastectomy: IDC 3.1 cm with DCIS, margins negative, 0/8 lymph nodes, T2  N0 stage II a, RCB burden 2.18, class II, ER 100%, PR 95%, HER-2 negative ratio 1.2, Ki-67 30%       04/07/2016 - 05/15/2016 Radiation Therapy    Adjuvant radiation       CHIEF COMPLIANT: Follow-up of breast cancer  INTERVAL HISTORY: Felicia [name] is a 56 year old with above-mentioned history of bilateral breast cancers one underwent bilateral mastectomies and radiation. She is here for surveillance. She is currently on Herceptin maintenance appears to be tolerating it very well. She is also here to start oral antiestrogen therapy with anastrozole.  REVIEW OF SYSTEMS:   Constitutional: Denies fevers, chills or abnormal weight loss Eyes: Denies blurriness of vision Ears, nose, mouth, throat, and face: Denies mucositis or sore throat Respiratory: Denies cough, dyspnea or wheezes Cardiovascular: Denies palpitation, chest discomfort Gastrointestinal:  Denies nausea, heartburn or change in bowel habits Skin: Denies abnormal skin rashes Lymphatics: Denies new lymphadenopathy or easy bruising Neurological tingling and numbness in the feet Behavioral/Psych: Mood is stable, no new changes  Extremities: No lower extremity edema Breast: Healing radiation dermatitis All other systems were reviewed with the patient and are negative.  I have reviewed the past medical history, past surgical history, social history and family history with the patient and they are unchanged from previous note.  ALLERGIES:  is allergic to no known  allergies.  MEDICATIONS:  Current Outpatient Prescriptions  Medication Sig Dispense Refill  . gabapentin (NEURONTIN) 100 MG capsule Take 1 capsule '100mg'$  once at bedtime daily 30 capsule 3  . hyaluronate sodium (RADIAPLEXRX) GEL Apply 1 application topically 2 (two) times daily.    Marland Kitchen letrozole (FEMARA) 2.5 MG tablet Take 1 tablet (2.5 mg total) by mouth daily. 90 tablet 3  . lidocaine-prilocaine (EMLA) cream Apply 1 application topically as needed (Apply 2- hours prior  to access of port). 30 g 1  . lisinopril (PRINIVIL,ZESTRIL) 10 MG tablet Take 10 mg by mouth daily.  0  . LORazepam (ATIVAN) 0.5 MG tablet Take 0.5 mg by mouth every 6 (six) hours as needed for anxiety.    . naproxen (NAPROSYN) 500 MG tablet Take 500 mg by mouth 2 (two) times daily as needed (for pain.).     Marland Kitchen non-metallic deodorant (ALRA) MISC Apply 1 application topically daily as needed.    Marland Kitchen omeprazole (PRILOSEC) 20 MG capsule Take 1 capsule (20 mg total) by mouth daily. 30 capsule 0  . prochlorperazine (COMPAZINE) 10 MG tablet Take 10 mg by mouth every 6 (six) hours as needed for nausea or vomiting.    . venlafaxine (EFFEXOR) 37.5 MG tablet Take 1 tablet (37.5 mg total) by mouth 2 (two) times daily. 60 tablet 0   No current facility-administered medications for this visit.    Facility-Administered Medications Ordered in Other Visits  Medication Dose Route Frequency Provider Last Rate Last Dose  . trastuzumab (HERCEPTIN) 588 mg in sodium chloride 0.9 % 250 mL chemo infusion  6 mg/kg (Treatment Plan Recorded) Intravenous Once Nicholas Lose, MD 556 mL/hr at 06/26/16 1226 588 mg at 06/26/16 1226    PHYSICAL EXAMINATION: ECOG PERFORMANCE STATUS: 1 - Symptomatic but completely ambulatory  Vitals:   06/26/16 1045  BP: (!) 162/73  Pulse: 63  Resp: 18  Temp: 97.4 F (36.3 C)   Filed Weights   06/26/16 1045  Weight: 216 lb (98 kg)    GENERAL:alert, no distress and comfortable SKIN: skin color, texture, turgor are normal, no rashes or significant lesions EYES: normal, Conjunctiva are pink and non-injected, sclera clear OROPHARYNX:no exudate, no erythema and lips, buccal mucosa, and tongue normal  NECK: supple, thyroid normal size, non-tender, without nodularity LYMPH:  no palpable lymphadenopathy in the cervical, axillary or inguinal LUNGS: clear to auscultation and percussion with normal breathing effort HEART: regular rate & rhythm and no murmurs and no lower extremity  edema ABDOMEN:abdomen soft, non-tender and normal bowel sounds MUSCULOSKELETAL:no cyanosis of digits and no clubbing  NEURO: alert & oriented x 3 with fluent speech, neuropathy in feet EXTREMITIES: No lower extremity edema BREAST: Discoloration of chest wall and axilla and scar tissue from radiation as well as the lower neck. (exam performed in the presence of a chaperone)  LABORATORY DATA:  I have reviewed the data as listed   Chemistry      Component Value Date/Time   NA 140 06/26/2016 0942   K 3.9 06/26/2016 0942   CL 107 02/12/2016 0606   CO2 26 06/26/2016 0942   BUN 11.2 06/26/2016 0942   CREATININE 0.7 06/26/2016 0942      Component Value Date/Time   CALCIUM 9.7 06/26/2016 0942   ALKPHOS 91 06/26/2016 0942   AST 26 06/26/2016 0942   ALT 27 06/26/2016 0942   BILITOT 0.72 06/26/2016 0942       Lab Results  Component Value Date   WBC 7.1 06/26/2016   HGB  13.7 06/26/2016   HCT 40.1 06/26/2016   MCV 82.5 06/26/2016   PLT 168 06/26/2016   NEUTROABS 5.0 06/26/2016    ASSESSMENT & PLAN:  Bilateral breast cancer ([place]) Treatment Summary: 1. Neoadjuvant chemotherapy with either AC followed by THP started 09/13/2015 and completed 12 weeks of Taxol 11/29/2015, Herceptin Perjeta 6; now on Herceptin  2. Bilateral mastectomies and lymph node surgery 02/11/16 3. Adjuvant radiation 04/07/2016 to 05/15/2016 4. Adjuvant antiestrogen therapy with Letrozole started 06/26/2016 5. 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 Current  treatment:  1. Adjuvant antiestrogen therapy with letrozole 2.5 mg daily started 06/26/2016 2. Herceptin maintenance will complete May 2018 After Herceptin is complete, she will be candidate for Neratinib  Return to clinic to see me every 9 weeks and every 3 weeks for Herceptin  I spent 25 minutes talking to the patient of which more than half was spent in counseling and coordination of care.  Orders Placed This Encounter  Procedures  . DG Bone Density    Standing Status:   Future    Standing Expiration Date:   06/26/2017    Order Specific Question:   Reason for Exam (SYMPTOM  OR DIAGNOSIS REQUIRED)    Answer:   post menopausal    Order Specific Question:   Is the patient pregnant?    Answer:   No    Order Specific Question:   Preferred imaging location?    Answer:   [facility]   The patient has a good understanding of the overall plan. she agrees with it. she will call with any problems that may develop before the next visit here.   Rulon Eisenmenger, MD 06/26/16

## 2016-06-26 NOTE — Patient Instructions (Signed)
[facility] Discharge Instructions for Patients Receiving Chemotherapy  Today you received the following chemotherapy agents: Herceptin   To help prevent nausea and vomiting after your treatment, we encourage you to take your nausea medication as directed.    If you develop nausea and vomiting that is not controlled by your nausea medication, call the clinic.   BELOW ARE SYMPTOMS THAT SHOULD BE REPORTED IMMEDIATELY:  *FEVER GREATER THAN 100.5 F  *CHILLS WITH OR WITHOUT FEVER  NAUSEA AND VOMITING THAT IS NOT CONTROLLED WITH YOUR NAUSEA MEDICATION  *UNUSUAL SHORTNESS OF BREATH  *UNUSUAL BRUISING OR BLEEDING  TENDERNESS IN MOUTH AND THROAT WITH OR WITHOUT PRESENCE OF ULCERS  *URINARY PROBLEMS  *BOWEL PROBLEMS  UNUSUAL RASH Items with * indicate a potential emergency and should be followed up as soon as possible.  Feel free to call the clinic you have any questions or concerns. The clinic phone number is [phone].  Please show the CHEMO ALERT CARD at check-in to the Emergency Department and triage nurse.   

## 2016-06-26 NOTE — Patient Instructions (Signed)
Implanted Port Home Guide An implanted port is a type of central line that is placed under the skin. Central lines are used to provide IV access when treatment or nutrition needs to be given through a person's veins. Implanted ports are used for long-term IV access. An implanted port may be placed because:  You need IV medicine that would be irritating to the small veins in your hands or arms.  You need long-term IV medicines, such as antibiotics.  You need IV nutrition for a long period.  You need frequent blood draws for lab tests.  You need dialysis.  Implanted ports are usually placed in the chest area, but they can also be placed in the upper arm, the abdomen, or the leg. An implanted port has two main parts:  Reservoir. The reservoir is round and will appear as a small, raised area under your skin. The reservoir is the part where a needle is inserted to give medicines or draw blood.  Catheter. The catheter is a thin, flexible tube that extends from the reservoir. The catheter is placed into a large vein. Medicine that is inserted into the reservoir goes into the catheter and then into the vein.  How will I care for my incision site? Do not get the incision site wet. Bathe or shower as directed by your health care provider. How is my port accessed? Special steps must be taken to access the port:  Before the port is accessed, a numbing cream can be placed on the skin. This helps numb the skin over the port site.  Your health care provider uses a sterile technique to access the port. ? Your health care provider must put on a mask and sterile gloves. ? The skin over your port is cleaned carefully with an antiseptic and allowed to dry. ? The port is gently pinched between sterile gloves, and a needle is inserted into the port.  Only "non-coring" port needles should be used to access the port. Once the port is accessed, a blood return should be checked. This helps ensure that the port  is in the vein and is not clogged.  If your port needs to remain accessed for a constant infusion, a clear (transparent) bandage will be placed over the needle site. The bandage and needle will need to be changed every week, or as directed by your health care provider.  Keep the bandage covering the needle clean and dry. Do not get it wet. Follow your health care provider's instructions on how to take a shower or bath while the port is accessed.  If your port does not need to stay accessed, no bandage is needed over the port.  What is flushing? Flushing helps keep the port from getting clogged. Follow your health care provider's instructions on how and when to flush the port. Ports are usually flushed with saline solution or a medicine called heparin. The need for flushing will depend on how the port is used.  If the port is used for intermittent medicines or blood draws, the port will need to be flushed: ? After medicines have been given. ? After blood has been drawn. ? As part of routine maintenance.  If a constant infusion is running, the port may not need to be flushed.  How long will my port stay implanted? The port can stay in for as long as your health care provider thinks it is needed. When it is time for the port to come out, surgery will be   done to remove it. The procedure is similar to the one performed when the port was put in. When should I seek immediate medical care? When you have an implanted port, you should seek immediate medical care if:  You notice a bad smell coming from the incision site.  You have swelling, redness, or drainage at the incision site.  You have more swelling or pain at the port site or the surrounding area.  You have a fever that is not controlled with medicine.  This information is not intended to replace advice given to you by your health care provider. Make sure you discuss any questions you have with your health care provider. Document  Released: 04/14/2005 Document Revised: 09/20/2015 Document Reviewed: 12/20/2012 Elsevier Interactive Patient Education  2017 [employer]  

## 2016-06-27 ENCOUNTER — Encounter: Payer: Self-pay | Admitting: Pharmacy Technician

## 2016-06-27 NOTE — Progress Notes (Unsigned)
06/27/16 Ordered Herceptin 4x150 mg vials by Fax. Tx scheduled for 07/17/16.

## 2016-06-27 NOTE — Progress Notes (Addendum)
Felicia [name]  56 y.o. Woman with Stage IIB of the right breast and Stage IIA of the left breast  radiation completed 05-27-16 one month FU.    Skin status:Bilateral breast chest walls with hyperpigmentation and sensitive to touch. What lotion are you using? Radiaplex gel and vitamin E oil and calium to breast. Have you seen med onc? If not, when is appointment:06-26-16 [place] If they are ER+, have they started Al or Tamoxifen? If not, why? 06-26-16 Letrozole Discuss survivorship appointment. Not scheduled Have you had a mammogram scheduled? Not scheduled yet Offer referral to [facility]. Will receive at the survivorship appointment. Oncotype Dx. Score:None Appetite:Good eating three meals per day with snacks. Pain:None Fatigue:No able to do her normal dail routine. Arm mobility:No problem raising both arm without discomfort has some numbness in both axilla areas. Lymphedema:None Wt Readings from Last 3 Encounters:  07/03/16 217 lb 12.8 oz (98.8 kg)  06/26/16 216 lb (98 kg)  05/27/16 217 lb 2 oz (98.5 kg)  BP (!) 177/94   Pulse 76   Temp 98.1 F (36.7 C) (Oral)   Resp 18   Ht 4\' 11"  (1.499 m)   Wt 217 lb 12.8 oz (98.8 kg)   SpO2 98%   BMI 43.99 kg/m Left leg  06-26-16 Saw Dr. Lindi Adie Return to clinic to see me every 9 weeks and every 3 weeks for Herceptin, Herceptin maintenance will complete May 2018 After Herceptin is complete, she will be candidate for Neratinib BP (!) 161/84   Pulse 76   Temp 98.1 F (36.7 C) (Oral)   Resp 18   Ht 4\' 11"  (1.499 m)   Wt 217 lb 12.8 oz (98.8 kg)   SpO2 98%   BMI 43.99 kg/m Left leg

## 2016-07-03 ENCOUNTER — Encounter: Payer: Self-pay | Admitting: Radiation Oncology

## 2016-07-03 ENCOUNTER — Ambulatory Visit
Admission: RE | Admit: 2016-07-03 | Discharge: 2016-07-03 | Disposition: A | Discharge status: Home / Self Care | Payer: Self-pay | Source: Ambulatory Visit | Attending: Radiation Oncology | Admitting: Radiation Oncology

## 2016-07-03 VITALS — BP 161/84 | HR 76 | Temp 98.1°F | Resp 18 | Ht 59.0 in | Wt 217.8 lb

## 2016-07-03 DIAGNOSIS — C50011 Malignant neoplasm of nipple and areola, right female breast: Secondary | ICD-10-CM

## 2016-07-03 DIAGNOSIS — C50912 Malignant neoplasm of unspecified site of left female breast: Secondary | ICD-10-CM | POA: Insufficient documentation

## 2016-07-03 DIAGNOSIS — C50812 Malignant neoplasm of overlapping sites of left female breast: Secondary | ICD-10-CM

## 2016-07-03 DIAGNOSIS — C50012 Malignant neoplasm of nipple and areola, left female breast: Secondary | ICD-10-CM

## 2016-07-03 DIAGNOSIS — Z923 Personal history of irradiation: Secondary | ICD-10-CM | POA: Insufficient documentation

## 2016-07-03 DIAGNOSIS — R0602 Shortness of breath: Secondary | ICD-10-CM | POA: Insufficient documentation

## 2016-07-03 DIAGNOSIS — C50811 Malignant neoplasm of overlapping sites of right female breast: Secondary | ICD-10-CM

## 2016-07-03 DIAGNOSIS — C50911 Malignant neoplasm of unspecified site of right female breast: Secondary | ICD-10-CM | POA: Insufficient documentation

## 2016-07-06 NOTE — Progress Notes (Addendum)
Radiation Oncology         [phone] ________________________________  Name: Felicia [name] MRN: [id number]  Date: 07/03/2016  DOB: 1961/02/04  Post Treatment Note  CC: No PCP Per Patient  Nicholas Lose, MD  Diagnosis:   Bilateral breast cancer  Interval Since Last Radiation:  5 weeks   04/07/16 - 05/27/16: Left Chest Wall treated to 50.4 Gy in 28 fractions, and then Boosted an additional 10 Gy in 5 fractions. Right Chest Wall treated to 50.4 Gy in 28 fractions, and then Boosted an additional 10 Gy in 5 fractions.   Narrative:  The patient returns today for routine follow-up. During treatment she did very well with radiotherapy and did not have significant desquamation.                             On review of systems, the patient states she's doing ok. Since her last visit she started herceptin and has had two infusions. She reports that after both infusions for two days following, she developed swelling at the midline of her chest, and says it was the size of a small breast. She also had shortness of breath and sternal chest pain that went on for about 1 day. She has not discussed this yet with anyone from medical oncology and reports she had a normal echo prior to Herceptin. She reports that these episodes resolve without intervention but that her daughter has witnessed her symptoms and accounts for the edema as well. She denies any symptoms that persist and stated that she has not had any trouble with symptoms outside of the time she describes. No other complaints are verbalized.  ALLERGIES:  is allergic to no known allergies.  Meds: Current Outpatient Prescriptions  Medication Sig Dispense Refill  . gabapentin (NEURONTIN) 100 MG capsule Take 1 capsule 100mg  once at bedtime daily 30 capsule 3  . hyaluronate sodium (RADIAPLEXRX) GEL Apply 1 application topically 2 (two) times daily.    Marland Kitchen letrozole (FEMARA) 2.5 MG tablet Take 1 tablet (2.5 mg total) by mouth daily. 90 tablet 3  .  lidocaine-prilocaine (EMLA) cream Apply 1 application topically as needed (Apply 2- hours prior to access of port). 30 g 1  . lisinopril (PRINIVIL,ZESTRIL) 10 MG tablet Take 10 mg by mouth daily.  0  . LORazepam (ATIVAN) 0.5 MG tablet Take 0.5 mg by mouth every 6 (six) hours as needed for anxiety.    . naproxen (NAPROSYN) 500 MG tablet Take 500 mg by mouth 2 (two) times daily as needed (for pain.).     Marland Kitchen omeprazole (PRILOSEC) 20 MG capsule Take 1 capsule (20 mg total) by mouth daily. (Patient not taking: Reported on 07/03/2016) 30 capsule 0  . prochlorperazine (COMPAZINE) 10 MG tablet Take 10 mg by mouth every 6 (six) hours as needed for nausea or vomiting.    . venlafaxine (EFFEXOR) 37.5 MG tablet Take 1 tablet (37.5 mg total) by mouth 2 (two) times daily. (Patient not taking: Reported on 07/03/2016) 60 tablet 0   No current facility-administered medications for this encounter.     Physical Findings:  height is 4\' 11"  (1.499 m) and weight is 217 lb 12.8 oz (98.8 kg). Her oral temperature is 98.1 F (36.7 C). Her blood pressure is 161/84 (abnormal) and her pulse is 76. Her respiration is 18 and oxygen saturation is 98%.  Pain Assessment Pain Score: 0-No pain/10 In general this is a well appearing Hispanic female in  no acute distress. She's alert and oriented x4 and appropriate throughout the examination. Cardiopulmonary assessment is negative for acute distress and she exhibits normal effort. RRR, no C/R/M, chest is CTAB. Bilateral mastectomy sites were examined and reveal hyperpigmentation of the skin and supraclavicular region on the right. No desquamation is noted. No chest wall edema is present or upper extremity edema.   Lab Findings: Lab Results  Component Value Date   WBC 7.1 06/26/2016   HGB 13.7 06/26/2016   HCT 40.1 06/26/2016   MCV 82.5 06/26/2016   PLT 168 06/26/2016     Radiographic Findings: No results found.  Impression/Plan: 1. Bilateral breast cancer. The patient has  been doing well since completion of radiotherapy. We discussed that we would be happy to continue to follow her as needed, but she will also continue to follow up with Dr. Lindi Adie in medical oncology. She was counseled on skin care as well as measures to avoid sun exposure to this area.  2. Chest wall edema and shortness of breath. I will contact Dr. Lindi Adie regarding this as well, and would be happy to send her to PT for lymphedema evaluation.     Carola Rhine, PAC

## 2016-07-09 ENCOUNTER — Ambulatory Visit: Payer: Self-pay | Admitting: Internal Medicine

## 2016-07-17 ENCOUNTER — Ambulatory Visit (HOSPITAL_BASED_OUTPATIENT_CLINIC_OR_DEPARTMENT_OTHER): Payer: Self-pay

## 2016-07-17 VITALS — BP 158/77 | HR 57 | Temp 97.7°F | Resp 18

## 2016-07-17 DIAGNOSIS — C50812 Malignant neoplasm of overlapping sites of left female breast: Principal | ICD-10-CM

## 2016-07-17 DIAGNOSIS — C50811 Malignant neoplasm of overlapping sites of right female breast: Secondary | ICD-10-CM

## 2016-07-17 DIAGNOSIS — Z171 Estrogen receptor negative status [ER-]: Principal | ICD-10-CM

## 2016-07-17 DIAGNOSIS — Z5112 Encounter for antineoplastic immunotherapy: Secondary | ICD-10-CM

## 2016-07-17 DIAGNOSIS — C50011 Malignant neoplasm of nipple and areola, right female breast: Secondary | ICD-10-CM

## 2016-07-17 DIAGNOSIS — C50012 Malignant neoplasm of nipple and areola, left female breast: Secondary | ICD-10-CM

## 2016-07-17 MED ORDER — SODIUM CHLORIDE 0.9 % IV SOLN
Freq: Once | INTRAVENOUS | Status: AC
  Administered 2016-07-17: 11:00:00 via INTRAVENOUS

## 2016-07-17 MED ORDER — ACETAMINOPHEN 325 MG PO TABS
ORAL_TABLET | ORAL | Status: AC
  Filled 2016-07-17: qty 2

## 2016-07-17 MED ORDER — SODIUM CHLORIDE 0.9% FLUSH
10.0000 mL | INTRAVENOUS | Status: DC | PRN
  Administered 2016-07-17: 10 mL
  Filled 2016-07-17: qty 10

## 2016-07-17 MED ORDER — DIPHENHYDRAMINE HCL 25 MG PO CAPS
ORAL_CAPSULE | ORAL | Status: AC
Start: 2016-07-17 — End: 2016-07-17
  Filled 2016-07-17: qty 2

## 2016-07-17 MED ORDER — DIPHENHYDRAMINE HCL 25 MG PO CAPS
50.0000 mg | ORAL_CAPSULE | Freq: Once | ORAL | Status: AC
  Administered 2016-07-17: 50 mg via ORAL

## 2016-07-17 MED ORDER — ACETAMINOPHEN 325 MG PO TABS
650.0000 mg | ORAL_TABLET | Freq: Once | ORAL | Status: AC
  Administered 2016-07-17: 650 mg via ORAL

## 2016-07-17 MED ORDER — HEPARIN SOD (PORK) LOCK FLUSH 100 UNIT/ML IV SOLN
500.0000 [IU] | Freq: Once | INTRAVENOUS | Status: AC | PRN
  Administered 2016-07-17: 500 [IU]
  Filled 2016-07-17: qty 5

## 2016-07-17 MED ORDER — TRASTUZUMAB CHEMO 150 MG IV SOLR
6.0000 mg/kg | Freq: Once | INTRAVENOUS | Status: AC
  Administered 2016-07-17: 588 mg via INTRAVENOUS
  Filled 2016-07-17: qty 28

## 2016-07-17 NOTE — Progress Notes (Signed)
Spanish interpreter, Carron Curie, was used throughout treatment to communicate with patient.

## 2016-07-17 NOTE — Patient Instructions (Signed)
[facility] Discharge Instructions for Patients Receiving Chemotherapy  Today you received the following chemotherapy agents: Herceptin   To help prevent nausea and vomiting after your treatment, we encourage you to take your nausea medication as directed.    If you develop nausea and vomiting that is not controlled by your nausea medication, call the clinic.   BELOW ARE SYMPTOMS THAT SHOULD BE REPORTED IMMEDIATELY:  *FEVER GREATER THAN 100.5 F  *CHILLS WITH OR WITHOUT FEVER  NAUSEA AND VOMITING THAT IS NOT CONTROLLED WITH YOUR NAUSEA MEDICATION  *UNUSUAL SHORTNESS OF BREATH  *UNUSUAL BRUISING OR BLEEDING  TENDERNESS IN MOUTH AND THROAT WITH OR WITHOUT PRESENCE OF ULCERS  *URINARY PROBLEMS  *BOWEL PROBLEMS  UNUSUAL RASH Items with * indicate a potential emergency and should be followed up as soon as possible.  Feel free to call the clinic you have any questions or concerns. The clinic phone number is [phone].  Please show the CHEMO ALERT CARD at check-in to the Emergency Department and triage nurse.   

## 2016-07-24 ENCOUNTER — Ambulatory Visit: Payer: Self-pay | Attending: Family Medicine | Admitting: Family Medicine

## 2016-07-24 ENCOUNTER — Encounter: Payer: Self-pay | Admitting: Family Medicine

## 2016-07-24 VITALS — BP 173/103 | HR 63 | Temp 97.9°F | Ht 62.0 in | Wt 219.6 lb

## 2016-07-24 DIAGNOSIS — Z923 Personal history of irradiation: Secondary | ICD-10-CM | POA: Insufficient documentation

## 2016-07-24 DIAGNOSIS — Z170 Estrogen receptor positive status [ER+]: Secondary | ICD-10-CM | POA: Insufficient documentation

## 2016-07-24 DIAGNOSIS — Z853 Personal history of malignant neoplasm of breast: Secondary | ICD-10-CM | POA: Insufficient documentation

## 2016-07-24 DIAGNOSIS — K219 Gastro-esophageal reflux disease without esophagitis: Secondary | ICD-10-CM | POA: Insufficient documentation

## 2016-07-24 DIAGNOSIS — Z9013 Acquired absence of bilateral breasts and nipples: Secondary | ICD-10-CM | POA: Insufficient documentation

## 2016-07-24 DIAGNOSIS — T50995A Adverse effect of other drugs, medicaments and biological substances, initial encounter: Secondary | ICD-10-CM | POA: Insufficient documentation

## 2016-07-24 DIAGNOSIS — T451X5A Adverse effect of antineoplastic and immunosuppressive drugs, initial encounter: Secondary | ICD-10-CM

## 2016-07-24 DIAGNOSIS — G620 Drug-induced polyneuropathy: Secondary | ICD-10-CM | POA: Insufficient documentation

## 2016-07-24 DIAGNOSIS — C50811 Malignant neoplasm of overlapping sites of right female breast: Secondary | ICD-10-CM

## 2016-07-24 DIAGNOSIS — C50812 Malignant neoplasm of overlapping sites of left female breast: Secondary | ICD-10-CM

## 2016-07-24 DIAGNOSIS — I10 Essential (primary) hypertension: Secondary | ICD-10-CM | POA: Insufficient documentation

## 2016-07-24 MED ORDER — LISINOPRIL 10 MG PO TABS: 10.0000 mg | ORAL_TABLET | Freq: Every day | ORAL | 3 refills | Status: DC

## 2016-07-24 NOTE — Progress Notes (Signed)
Subjective:  Patient ID: Felicia [name], female    DOB: 1961-03-16  Age: 56 y.o. MRN: [id number]  CC: Breast Cancer and Establish Care   HPI Felicia [name] is 56 year old female with a history of GERD, bilateral breast cancers diagnosed in 07/2015 (status post bilateral mastectomy and radiation), status post bilateral lymph node excision currently undergoing adjuvant antiestrogen therapy and Herceptin maintenance who comes into the clinic to establish care.  She is closely followed by the [facility] and has been compliant with her appointments and treatment. 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 She also has omeprazole for GERD which she has not been taking either she states symptoms are intermittent.  Blood pressure is elevated today and she is not currently on any antihypertensive. At her last visit of the [facility] her BP reading was 162/73.  She is accompanied by her daughter and has no additional concerns at this time.  Past Medical History:  Diagnosis Date  . Cancer ([place])    breast  . GERD (gastroesophageal reflux disease)     Past Surgical History:  Procedure Laterality Date  . MASTECTOMY MODIFIED RADICAL Bilateral 02/11/2016  . MASTECTOMY MODIFIED RADICAL Bilateral 02/11/2016   Procedure: BILATERAL MASTECTOMY MODIFIED RADICAL;  Surgeon: Rolm Bookbinder, MD;  Location: [facility];  Service: General;  Laterality: Bilateral;  . PORTACATH PLACEMENT N/A 09/04/2015   Procedure: INSERTION PORT-A-CATH WITH [place];  Surgeon: Rolm Bookbinder, MD;  Location: [facility];  Service: General;  Laterality: N/A;  . TUBAL LIGATION      Allergies  Allergen Reactions  . No Known Allergies      Outpatient Medications Prior to Visit  Medication Sig Dispense Refill  . letrozole (FEMARA) 2.5 MG tablet Take 1 tablet (2.5 mg total) by mouth daily. 90 tablet 3  . lidocaine-prilocaine  (EMLA) cream Apply 1 application topically as needed (Apply 2- hours prior to access of port). 30 g 1  . gabapentin (NEURONTIN) 100 MG capsule Take 1 capsule 100mg  once at bedtime daily (Patient not taking: Reported on 07/24/2016) 30 capsule 3  . hyaluronate sodium (RADIAPLEXRX) GEL Apply 1 application topically 2 (two) times daily.    Marland Kitchen LORazepam (ATIVAN) 0.5 MG tablet Take 0.5 mg by mouth every 6 (six) hours as needed for anxiety.    . naproxen (NAPROSYN) 500 MG tablet Take 500 mg by mouth 2 (two) times daily as needed (for pain.).     Marland Kitchen omeprazole (PRILOSEC) 20 MG capsule Take 1 capsule (20 mg total) by mouth daily. (Patient not taking: Reported on 07/03/2016) 30 capsule 0  . prochlorperazine (COMPAZINE) 10 MG tablet Take 10 mg by mouth every 6 (six) hours as needed for nausea or vomiting.    . venlafaxine (EFFEXOR) 37.5 MG tablet Take 1 tablet (37.5 mg total) by mouth 2 (two) times daily. (Patient not taking: Reported on 07/03/2016) 60 tablet 0  . lisinopril (PRINIVIL,ZESTRIL) 10 MG tablet Take 10 mg by mouth daily.  0   No facility-administered medications prior to visit.     ROS Review of Systems  Constitutional: Negative for activity change, appetite change and fatigue.  HENT: Negative for congestion, sinus pressure and sore throat.   Eyes: Negative for visual disturbance.  Respiratory: Negative for cough, chest tightness, shortness of breath and wheezing.   Cardiovascular: Negative for chest pain and palpitations.  Gastrointestinal: Negative for abdominal distention, abdominal pain and constipation.  Endocrine: Negative for polydipsia.  Genitourinary: Negative for dysuria and frequency.  Musculoskeletal: Negative for arthralgias and back pain.  Skin: Negative for rash.  Neurological: Positive for numbness. Negative for tremors and light-headedness.  Hematological: Does not bruise/bleed easily.  Psychiatric/Behavioral: Negative for agitation and behavioral problems.    Objective:  BP  (!) 173/103 (BP Location: Left Leg, Patient Position: Sitting, Cuff Size: Large)   Pulse 63   Temp 97.9 F (36.6 C) (Oral)   Ht 5\' 2"  (1.575 m)   Wt 219 lb 9.6 oz (99.6 kg)   SpO2 98%   BMI 40.17 kg/m   BP/Weight 07/24/2016 07/10/3886 10/31/7970  Systolic BP 820 601 561  Diastolic BP 537 77 84  Wt. (Lbs) 219.6 - 217.8  BMI 40.17 - 43.99      Physical Exam  Constitutional: She is oriented to person, place, and time. She appears well-developed and well-nourished.  Cardiovascular: Normal rate, normal heart sounds and intact distal pulses.   No murmur heard. Pulmonary/Chest: Effort normal and breath sounds normal. She has no wheezes. She has no rales. She exhibits no tenderness.  Bilateral Mastectomy Right IJ port in place  Abdominal: Soft. Bowel sounds are normal. She exhibits no distension and no mass. There is no tenderness.  Musculoskeletal: Normal range of motion.  Neurological: She is alert and oriented to person, place, and time.     Assessment & Plan:   1. Chemotherapy-induced peripheral neuropathy (HCC) Not taking gabapentin  2. 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  3. Essential hypertension Placed on lisinopril Low-sodium, DASH  Diet We will check creatinine at next visit   Meds ordered this encounter  Medications  . lisinopril (PRINIVIL,ZESTRIL) 10 MG tablet    Sig: Take 1 tablet (10 mg total) by mouth daily.    Dispense:  30 tablet    Refill:  3    Follow-up: Return in about 6 weeks (around 09/04/2016) for follow up on Hypertension.   Arnoldo Morale MD

## 2016-07-24 NOTE — Patient Instructions (Signed)

## 2016-08-07 ENCOUNTER — Other Ambulatory Visit: Payer: Self-pay

## 2016-08-07 ENCOUNTER — Ambulatory Visit (HOSPITAL_BASED_OUTPATIENT_CLINIC_OR_DEPARTMENT_OTHER): Payer: Self-pay | Admitting: Nurse Practitioner

## 2016-08-07 ENCOUNTER — Ambulatory Visit (HOSPITAL_BASED_OUTPATIENT_CLINIC_OR_DEPARTMENT_OTHER): Payer: Self-pay

## 2016-08-07 VITALS — BP 168/72 | HR 82 | Temp 98.7°F | Resp 17

## 2016-08-07 DIAGNOSIS — C50811 Malignant neoplasm of overlapping sites of right female breast: Secondary | ICD-10-CM

## 2016-08-07 DIAGNOSIS — Z5112 Encounter for antineoplastic immunotherapy: Secondary | ICD-10-CM

## 2016-08-07 DIAGNOSIS — J329 Chronic sinusitis, unspecified: Secondary | ICD-10-CM

## 2016-08-07 DIAGNOSIS — C50812 Malignant neoplasm of overlapping sites of left female breast: Secondary | ICD-10-CM

## 2016-08-07 DIAGNOSIS — Z171 Estrogen receptor negative status [ER-]: Principal | ICD-10-CM

## 2016-08-07 DIAGNOSIS — J0100 Acute maxillary sinusitis, unspecified: Secondary | ICD-10-CM

## 2016-08-07 MED ORDER — AZITHROMYCIN 250 MG PO TABS: ORAL_TABLET | ORAL | 0 refills | Status: DC

## 2016-08-07 MED ORDER — DIPHENHYDRAMINE HCL 25 MG PO CAPS
ORAL_CAPSULE | ORAL | Status: AC
  Filled 2016-08-07: qty 2

## 2016-08-07 MED ORDER — HEPARIN SOD (PORK) LOCK FLUSH 100 UNIT/ML IV SOLN
500.0000 [IU] | Freq: Once | INTRAVENOUS | Status: AC | PRN
  Administered 2016-08-07: 500 [IU]
  Filled 2016-08-07: qty 5

## 2016-08-07 MED ORDER — SODIUM CHLORIDE 0.9 % IV SOLN
Freq: Once | INTRAVENOUS | Status: AC
  Administered 2016-08-07: 12:00:00 via INTRAVENOUS

## 2016-08-07 MED ORDER — TRASTUZUMAB CHEMO 150 MG IV SOLR
6.0000 mg/kg | Freq: Once | INTRAVENOUS | Status: AC
  Administered 2016-08-07: 588 mg via INTRAVENOUS
  Filled 2016-08-07: qty 28

## 2016-08-07 MED ORDER — ACETAMINOPHEN 325 MG PO TABS
650.0000 mg | ORAL_TABLET | Freq: Once | ORAL | Status: AC
  Administered 2016-08-07: 650 mg via ORAL

## 2016-08-07 MED ORDER — SODIUM CHLORIDE 0.9% FLUSH
10.0000 mL | INTRAVENOUS | Status: DC | PRN
  Administered 2016-08-07: 10 mL
  Filled 2016-08-07: qty 10

## 2016-08-07 MED ORDER — DIPHENHYDRAMINE HCL 25 MG PO CAPS
50.0000 mg | ORAL_CAPSULE | Freq: Once | ORAL | Status: AC
  Administered 2016-08-07: 50 mg via ORAL

## 2016-08-07 MED ORDER — ACETAMINOPHEN 325 MG PO TABS
ORAL_TABLET | ORAL | Status: AC
  Filled 2016-08-07: qty 2

## 2016-08-07 NOTE — Patient Instructions (Signed)
Cancer Center Discharge Instructions for Patients Receiving Chemotherapy  Today you received the following chemotherapy agents: Herceptin   To help prevent nausea and vomiting after your treatment, we encourage you to take your nausea medication as directed.    If you develop nausea and vomiting that is not controlled by your nausea medication, call the clinic.   BELOW ARE SYMPTOMS THAT SHOULD BE REPORTED IMMEDIATELY:  *FEVER GREATER THAN 100.5 F  *CHILLS WITH OR WITHOUT FEVER  NAUSEA AND VOMITING THAT IS NOT CONTROLLED WITH YOUR NAUSEA MEDICATION  *UNUSUAL SHORTNESS OF BREATH  *UNUSUAL BRUISING OR BLEEDING  TENDERNESS IN MOUTH AND THROAT WITH OR WITHOUT PRESENCE OF ULCERS  *URINARY PROBLEMS  *BOWEL PROBLEMS  UNUSUAL RASH Items with * indicate a potential emergency and should be followed up as soon as possible.  Feel free to call the clinic you have any questions or concerns. The clinic phone number is [phone].  Please show the CHEMO ALERT CARD at check-in to the Emergency Department and triage nurse.   

## 2016-08-07 NOTE — Progress Notes (Signed)
1130 - Patient reported cough, green mucous, sore throat and runny nose. Called [facility] to assess in infusion. OK to proceed with Herceptin per [place].   Wylene Simmer, BSN, RN 08/07/2016 2:35 PM

## 2016-08-08 ENCOUNTER — Encounter: Payer: Self-pay | Admitting: Nurse Practitioner

## 2016-08-08 DIAGNOSIS — J329 Chronic sinusitis, unspecified: Secondary | ICD-10-CM | POA: Insufficient documentation

## 2016-08-08 NOTE — Assessment & Plan Note (Signed)
Patient presented to the [facility] today to receive her next cycle of Herceptin infusion.  She is scheduled for an echo on 08/25/2016.  She is scheduled for labs, flush, visit, and her next cycle of Herceptin on 08/28/2016.

## 2016-08-08 NOTE — Assessment & Plan Note (Signed)
Patient presented to the [facility] today to receive her Herceptin only.  She complains of significant sinus congestion, headache, some facial tenderness, and an occasional congested cough.  She denies any recent fevers or chills.  On exam today.  Lungs are clear bilaterally and patient appears in no acute distress.  Patient does have nasal congestion.  Vital signs were stable and patient was afebrile.  Advised patient that we could treat her for mild sinusitis symptoms.  She stated that she preferred to try a Zithromax pack.  Initially to see if that would help.  Will order a Z-Pak for patient's mild sinusitis.  Patient was advised to follow-up with her primary care physician or return to the [facility] if symptoms worsen or persist.

## 2016-08-08 NOTE — Progress Notes (Signed)
SYMPTOM MANAGEMENT CLINIC    Chief Complaint: Sinusitis  HPI:  Felicia [name] 56 y.o. female diagnosed with breast cancer.  Currently undergoing Herceptin infusions.     Bilateral breast cancer ([place])   08/16/2015 Mammogram    Left breast LIQ 4.2 cm, 4 mm oval mass 12:00, 1.2 cm mass left UOQ, enlarged left axillary lymph node      08/16/2015 Mammogram    Right breast UIQ: 3.9 cm irregular mass, 1.5 cm mass in the right LOQ, 9 mm mass right UOQ, 6 mm mass right LIQ, enlarged lymph node Rt axilla      08/20/2015 Initial Diagnosis    Right breast biopsy 1:00: IDC grade 2 ER 95%, PR 2%, Ki 67 15%; HER-2 negative ratio 1.04; 10:00: IDC grade 2-3 dissimilar to 1:00 biopsy 10:00: ER 95%, PR 0%, K67: 25%, HER-2 positive ratio 6.42 right axillary lymph node positive for metastatic cancer      08/20/2015 Procedure    Left biopsy 8:00: IDC with DCIS, grade 2; ER 95%, PR 95%, Ki-67 30%, HER-2 negative ratio 1.2; 12:00: IDC with DCIS, grade 1 ER 95%, PR 95%, Ki-67 10% HER-2 negative ratio 1.14 not similar to this biopsy, left axillary lymph node positive,       09/13/2015 - 01/17/2016 Neo-Adjuvant Chemotherapy    Neoadjuvant dose dense Adriamycin and Cytoxan followed by Taxol Herceptin Perjeta, followed by Herceptin maintenance      01/21/2016 Breast MRI    Bilateral breast masses significantly decreased in size with mild residual NME, complete resolution of numerous other nodules in both breasts, bilateral axillary lymph nodes and bilateral retropectoral lymph nodes decreased in size      02/11/2016 Surgery    Right mastectomy: IDC 2 nodules, 2.5, 1 cm, margins negative, 3/7 lymph nodes positive T2 N1a stage II B, ER 95%, PR 2%, HER-2 negative, Ki-67 15% RCB burden 3.1 and 2.9 class II      02/11/2016 Surgery    Left mastectomy: IDC 3.1 cm with DCIS, margins negative, 0/8 lymph nodes, T2 N0 stage II a, RCB burden 2.18, class II, ER 100%, PR 95%, HER-2 negative ratio 1.2, Ki-67 30%       04/07/2016 - 05/15/2016 Radiation Therapy    Adjuvant radiation       Review of Systems  Constitutional: Positive for malaise/fatigue.  HENT: Positive for congestion and sinus pain.   All other systems reviewed and are negative.   Past Medical History:  Diagnosis Date  . Cancer ([place])    breast  . GERD (gastroesophageal reflux disease)     Past Surgical History:  Procedure Laterality Date  . MASTECTOMY MODIFIED RADICAL Bilateral 02/11/2016  . MASTECTOMY MODIFIED RADICAL Bilateral 02/11/2016   Procedure: BILATERAL MASTECTOMY MODIFIED RADICAL;  Surgeon: Rolm Bookbinder, MD;  Location: [place];  Service: General;  Laterality: Bilateral;  . PORTACATH PLACEMENT N/A 09/04/2015   Procedure: INSERTION PORT-A-CATH WITH [place];  Surgeon: Rolm Bookbinder, MD;  Location: [place];  Service: General;  Laterality: N/A;  . TUBAL LIGATION      has Bilateral breast cancer ([place]); Bilateral malignant neoplasm of overlapping sites of breast in female [facility]); Hypersensitivity reaction; Chemotherapy-induced peripheral neuropathy ([place]); Port catheter in place; Hypertension; and Sinusitis on her problem list.    is allergic to no known allergies.  Allergies as of 08/07/2016      Reactions   No Known Allergies       Medication List       Accurate as of 08/07/16  11:59 PM. Always use your most recent med list.          azithromycin 250 MG tablet Commonly known as:  ZITHROMAX Z-PAK Z pak: take as directed.   gabapentin 100 MG capsule Commonly known as:  NEURONTIN Take 1 capsule '100mg'$  once at bedtime daily   hyaluronate sodium Gel Apply 1 application topically 2 (two) times daily.   letrozole 2.5 MG tablet Commonly known as:  FEMARA Take 1 tablet (2.5 mg total) by mouth daily.   lidocaine-prilocaine cream Commonly known as:  EMLA Apply 1 application topically as needed (Apply 2- hours prior to access of port).   lisinopril 10 MG tablet Commonly known as:   PRINIVIL,ZESTRIL Take 1 tablet (10 mg total) by mouth daily.   LORazepam 0.5 MG tablet Commonly known as:  ATIVAN Take 0.5 mg by mouth every 6 (six) hours as needed for anxiety.   naproxen 500 MG tablet Commonly known as:  NAPROSYN Take 500 mg by mouth 2 (two) times daily as needed (for pain.).   omeprazole 20 MG capsule Commonly known as:  PRILOSEC Take 1 capsule (20 mg total) by mouth daily.   prochlorperazine 10 MG tablet Commonly known as:  COMPAZINE Take 10 mg by mouth every 6 (six) hours as needed for nausea or vomiting.   venlafaxine 37.5 MG tablet Commonly known as:  EFFEXOR Take 1 tablet (37.5 mg total) by mouth 2 (two) times daily.   VITAMIN D (ERGOCALCIFEROL) PO Take by mouth.        PHYSICAL EXAMINATION  Oncology Vitals 08/07/2016 07/24/2016  Height - 158 cm  Weight - 99.61 kg  Weight (lbs) - 219 lbs 10 oz  BMI (kg/m2) - 40.17 kg/m2  Temp 98.7 97.9  Pulse 82 63  Resp 17 -  SpO2 100 98  BSA (m2) - 2.09 m2   BP Readings from Last 2 Encounters:  08/07/16 (!) 168/72  07/24/16 (!) 173/103    Physical Exam  Constitutional: She is oriented to person, place, and time and well-developed, well-nourished, and in no distress.  HENT:  Head: Normocephalic and atraumatic.  Mouth/Throat: Oropharynx is clear and moist.  Moderate nasal congestion on exam.  Posterior oropharynx clear.  Eyes: Conjunctivae and EOM are normal. Pupils are equal, round, and reactive to light. Right eye exhibits no discharge. Left eye exhibits no discharge. No scleral icterus.  Neck: Normal range of motion. Neck supple. No JVD present. No tracheal deviation present. No thyromegaly present.  Cardiovascular: Normal rate, regular rhythm, normal heart sounds and intact distal pulses.   Pulmonary/Chest: Effort normal and breath sounds normal. No respiratory distress. She has no wheezes. She has no rales. She exhibits no tenderness.  Abdominal: Soft. Bowel sounds are normal. She exhibits no  distension and no mass. There is no tenderness. There is no rebound and no guarding.  Musculoskeletal: Normal range of motion. She exhibits no edema or tenderness.  Lymphadenopathy:    She has no cervical adenopathy.  Neurological: She is alert and oriented to person, place, and time. Gait normal.  Skin: Skin is warm and dry. No rash noted. No erythema. No pallor.  Psychiatric: Affect normal.  Nursing note and vitals reviewed.   LABORATORY DATA:. No visits with results within 3 Day(s) from this visit.  Latest known visit with results is:  Appointment on 06/26/2016  Component Date Value Ref Range Status  . WBC 06/26/2016 7.1  3.9 - 10.3 10e3/uL Final  . NEUT# 06/26/2016 5.0  1.5 - 6.5 10e3/uL Final  .  HGB 06/26/2016 13.7  11.6 - 15.9 g/dL Final  . HCT 31/67/4255 40.1  34.8 - 46.6 % Final  . Platelets 06/26/2016 168  145 - 400 10e3/uL Final  . MCV 06/26/2016 82.5  79.5 - 101.0 fL Final  . MCH 06/26/2016 28.2  25.1 - 34.0 pg Final  . MCHC 06/26/2016 34.2  31.5 - 36.0 g/dL Final  . RBC 25/89/4834 4.86  3.70 - 5.45 10e6/uL Final  . RDW 06/26/2016 14.0  11.2 - 14.5 % Final  . lymph# 06/26/2016 1.5  0.9 - 3.3 10e3/uL Final  . MONO# 06/26/2016 0.5  0.1 - 0.9 10e3/uL Final  . Eosinophils Absolute 06/26/2016 0.1  0.0 - 0.5 10e3/uL Final  . Basophils Absolute 06/26/2016 0.0  0.0 - 0.1 10e3/uL Final  . NEUT% 06/26/2016 70.4  38.4 - 76.8 % Final  . LYMPH% 06/26/2016 21.2  14.0 - 49.7 % Final  . MONO% 06/26/2016 7.1  0.0 - 14.0 % Final  . EOS% 06/26/2016 1.0  0.0 - 7.0 % Final  . BASO% 06/26/2016 0.3  0.0 - 2.0 % Final  . nRBC 06/26/2016 0  0 - 0 % Final  . Sodium 06/26/2016 140  136 - 145 mEq/L Final  . Potassium 06/26/2016 3.9  3.5 - 5.1 mEq/L Final  . Chloride 06/26/2016 107  98 - 109 mEq/L Final  . CO2 06/26/2016 26  22 - 29 mEq/L Final  . Glucose 06/26/2016 84  70 - 140 mg/dl Final  . BUN 75/83/0746 11.2  7.0 - 26.0 mg/dL Final  . Creatinine 00/29/8473 0.7  0.6 - 1.1 mg/dL Final  .  Total Bilirubin 06/26/2016 0.72  0.20 - 1.20 mg/dL Final  . Alkaline Phosphatase 06/26/2016 91  40 - 150 U/L Final  . AST 06/26/2016 26  5 - 34 U/L Final  . ALT 06/26/2016 27  0 - 55 U/L Final  . Total Protein 06/26/2016 7.2  6.4 - 8.3 g/dL Final  . Albumin 08/56/9437 3.9  3.5 - 5.0 g/dL Final  . Calcium 00/52/5910 9.7  8.4 - 10.4 mg/dL Final  . Anion Gap 28/90/2284 7  3 - 11 mEq/L Final  . EGFR 06/26/2016 >90  >90 ml/min/1.73 m2 Final    RADIOGRAPHIC STUDIES: No results found.  ASSESSMENT/PLAN:    Sinusitis Patient presented to the cancer Center today to receive her Herceptin only.  She complains of significant sinus congestion, headache, some facial tenderness, and an occasional congested cough.  She denies any recent fevers or chills.  On exam today.  Lungs are clear bilaterally and patient appears in no acute distress.  Patient does have nasal congestion.  Vital signs were stable and patient was afebrile.  Advised patient that we could treat her for mild sinusitis symptoms.  She stated that she preferred to try a Zithromax pack.  Initially to see if that would help.  Will order a Z-Pak for patient's mild sinusitis.  Patient was advised to follow-up with her primary care physician or return to the cancer Center if symptoms worsen or persist.    Bilateral malignant neoplasm of overlapping sites of breast in female [facility]) Patient presented to the cancer Center today to receive her next cycle of Herceptin infusion.  She is scheduled for an echo on 08/25/2016.  She is scheduled for labs, flush, visit, and her next cycle of Herceptin on 08/28/2016.   Patient stated understanding of all instructions; and was in agreement with this plan of care. The patient knows to call the clinic with  any problems, questions or concerns.   Total time spent with patient was 15 minutes;  with greater than 75 percent of that time spent in face to face counseling regarding patient's symptoms,  and coordination  of care and follow up.  Disclaimer:This dictation was prepared with Dragon/digital dictation along with [employer]. Any transcriptional errors that result from this process are unintentional.  Drue Second, NP 08/08/2016

## 2016-08-25 ENCOUNTER — Encounter (HOSPITAL_COMMUNITY): Payer: Self-pay

## 2016-08-25 ENCOUNTER — Ambulatory Visit (HOSPITAL_BASED_OUTPATIENT_CLINIC_OR_DEPARTMENT_OTHER)
Admission: RE | Admit: 2016-08-25 | Discharge: 2016-08-25 | Disposition: A | Discharge status: Home / Self Care | Payer: Self-pay | Source: Ambulatory Visit

## 2016-08-25 ENCOUNTER — Ambulatory Visit (HOSPITAL_COMMUNITY)
Admission: RE | Admit: 2016-08-25 | Discharge: 2016-08-25 | Disposition: A | Discharge status: Home / Self Care | Payer: Self-pay | Source: Ambulatory Visit | Attending: Cardiology | Admitting: Cardiology

## 2016-08-25 VITALS — BP 160/86 | HR 63 | Wt 222.5 lb

## 2016-08-25 DIAGNOSIS — G620 Drug-induced polyneuropathy: Secondary | ICD-10-CM

## 2016-08-25 DIAGNOSIS — I10 Essential (primary) hypertension: Secondary | ICD-10-CM

## 2016-08-25 DIAGNOSIS — I340 Nonrheumatic mitral (valve) insufficiency: Secondary | ICD-10-CM | POA: Insufficient documentation

## 2016-08-25 DIAGNOSIS — C50812 Malignant neoplasm of overlapping sites of left female breast: Secondary | ICD-10-CM

## 2016-08-25 DIAGNOSIS — C50012 Malignant neoplasm of nipple and areola, left female breast: Secondary | ICD-10-CM

## 2016-08-25 DIAGNOSIS — Z79899 Other long term (current) drug therapy: Secondary | ICD-10-CM | POA: Insufficient documentation

## 2016-08-25 DIAGNOSIS — Z170 Estrogen receptor positive status [ER+]: Secondary | ICD-10-CM

## 2016-08-25 DIAGNOSIS — T451X5A Adverse effect of antineoplastic and immunosuppressive drugs, initial encounter: Secondary | ICD-10-CM

## 2016-08-25 DIAGNOSIS — I371 Nonrheumatic pulmonary valve insufficiency: Secondary | ICD-10-CM | POA: Insufficient documentation

## 2016-08-25 DIAGNOSIS — I071 Rheumatic tricuspid insufficiency: Secondary | ICD-10-CM | POA: Insufficient documentation

## 2016-08-25 DIAGNOSIS — C50811 Malignant neoplasm of overlapping sites of right female breast: Secondary | ICD-10-CM | POA: Insufficient documentation

## 2016-08-25 DIAGNOSIS — C50011 Malignant neoplasm of nipple and areola, right female breast: Secondary | ICD-10-CM

## 2016-08-25 LAB — ECHOCARDIOGRAM COMPLETE
AVLVOTPG: 6 mmHg
CHL CUP DOP CALC LVOT VTI: 27.3 cm
CHL CUP LV S' LATERAL: 9.9 cm/s
EERAT: 11.62
EWDT: 194 ms
FS: 38 % (ref 28–44)
IVS/LV PW RATIO, ED: 1.1
LA ID, A-P, ES: 42 mm
LA diam index: 2.11 cm/m2
LA vol A4C: 28.5 ml
LA vol index: 16.2 mL/m2
LAVOL: 32.3 mL
LEFT ATRIUM END SYS DIAM: 42 mm
LV E/e' medial: 11.62
LV PW d: 9.05 mm — AB (ref 0.6–1.1)
LV TDI E'LATERAL: 9.9
LV TDI E'MEDIAL: 6.96
LV e' LATERAL: 9.9 cm/s
LVEEAVG: 11.62
LVOTPV: 127 cm/s
MV Dec: 194
MV pk A vel: 96.3 m/s
MV pk E vel: 115 m/s
MVPG: 5 mmHg
RV LATERAL S' VELOCITY: 11.7 cm/s
RV TAPSE: 17.2 mm

## 2016-08-25 MED ORDER — AMLODIPINE BESYLATE 5 MG PO TABS: 5.0000 mg | ORAL_TABLET | Freq: Every day | ORAL | 3 refills | Status: DC

## 2016-08-25 NOTE — Progress Notes (Signed)
  Echocardiogram 2D Echocardiogram has been performed.  Felicia [name] M 08/25/2016, 9:50 AM

## 2016-08-25 NOTE — Progress Notes (Signed)
[facility] CLINIC CONSULT NOTE  Referring Physician: Dr Lindi Adie  HPI: [name] is a 56 year old with history of bilateral breast cancer.     Bilateral breast cancer ([place])   08/16/2015 Mammogram    Left breast LIQ 4.2 cm, 4 mm oval mass 12:00, 1.2 cm mass left UOQ, enlarged left axillary lymph node      08/16/2015 Mammogram    Right breast UIQ: 3.9 cm irregular mass, 1.5 cm mass in the right LOQ, 9 mm mass right UOQ, 6 mm mass right LIQ, enlarged lymph node Rt axilla      08/20/2015 Initial Diagnosis    Right breast biopsy 1:00: IDC grade 2 ER 95%, PR 2%, Ki 67 15%; HER-2 negative ratio 1.04; 10:00: IDC grade 2-3 dissimilar to 1:00 biopsy 10:00: ER 95%, PR 0%, K67: 25%, HER-2 positive ratio 6.42 right axillary lymph node positive for metastatic cancer      08/20/2015 Procedure    Left biopsy 8:00: IDC with DCIS, grade 2; ER 95%, PR 95%, Ki-67 30%, HER-2 negative ratio 1.2; 12:00: IDC with DCIS, grade 1 ER 95%, PR 95%, Ki-67 10% HER-2 negative ratio 1.14 not similar to this biopsy, left axillary lymph node positive,       09/13/2015 - 01/17/2016 Neo-Adjuvant Chemotherapy    Neoadjuvant dose dense Adriamycin and Cytoxan followed by Taxol Herceptin Perjeta, followed by Herceptin maintenance      01/21/2016 Breast MRI    Bilateral breast masses significantly decreased in size with mild residual NME, complete resolution of numerous other nodules in both breasts, bilateral axillary lymph nodes and bilateral retropectoral lymph nodes decreased in size      02/11/2016 Surgery    Right mastectomy: IDC 2 nodules, 2.5, 1 cm, margins negative, 3/7 lymph nodes positive T2 N1a stage II B, ER 95%, PR 2%, HER-2 negative, Ki-67 15% RCB burden 3.1 and 2.9 class II      02/11/2016 Surgery    Left mastectomy: IDC 3.1 cm with DCIS, margins negative, 0/8 lymph nodes, T2 N0 stage II a, RCB burden 2.18, class II, ER 100%, PR 95%, HER-2 negative ratio 1.2, Ki-67  30%       04/07/2016 - 05/15/2016 Radiation Therapy    Adjuvant radiation      Overall feeling ok. No dyspnea or chest pain. BP high in the office today, SBP runs 130s-140s when she checks at home.  She says that she starts to cough about an hour after taking lisinopril.   ECHO 07/2015 EF 60-65% ECHO 01/11/2016 EF 60-65%. Lat S'11 GLS -19.3%   ECHO 1/18 EF 60-65%, GLS -21.3%, normal RV size and systolic function.  ECHO 4/18 EF 55-60%, GLS -22%  FH: Great Grandmother - heart disease  Review of Systems: All systems reviewed and negative except as per HPI.   Past Medical History:  Diagnosis Date  . Cancer ([facility])    breast  . GERD (gastroesophageal reflux disease)     Current Outpatient Prescriptions  Medication Sig Dispense Refill  . letrozole (FEMARA) 2.5 MG tablet Take 1 tablet (2.5 mg total) by mouth daily. 90 tablet 3  . lidocaine-prilocaine (EMLA) cream Apply 1 application topically as needed (Apply 2- hours prior to access of port). 30 g 1  . amLODipine (NORVASC) 5 MG tablet Take 1 tablet (5 mg total) by mouth daily. 30 tablet 3   No current facility-administered medications for this encounter.     Allergies  Allergen Reactions  . No Known Allergies  Social History   Social History  . Marital status: Married    Spouse name: Elon Jester Real  . Number of children: 3  . Years of education: N/A   Occupational History  . Not on file.   Social History Main Topics  . Smoking status: Never Smoker  . Smokeless tobacco: Never Used  . Alcohol use No  . Drug use: No  . Sexual activity: Yes    Birth control/ protection: Surgical   Other Topics Concern  . Not on file   Social History Narrative   3 children    Felicia [name] age 66, lives in [place]    Felicia [name] age 94 lives in [place]   Felicia [name] age 45 lives in [place]      Family History  Problem Relation Age of Onset  . Diabetes Mother     Vitals:   08/25/16 1000   BP: (!) 160/86  Pulse: 63  SpO2: 100%  Weight: 222 lb 8 oz (100.9 kg)    PHYSICAL EXAM: General:  NAD HEENT: normal Neck: supple. JVP 7 cm. Carotids 2+ bilat; no bruits. No lymphadenopathy or thryomegaly appreciated. Cor: PMI nondisplaced. Regular rate & rhythm. No rubs, gallops or murmurs. Lungs: clear to auscultation bilaterally.  Abdomen: soft, nontender, nondistended. No hepatosplenomegaly. No bruits or masses. Good bowel sounds. Extremities: no cyanosis, clubbing, rash, edema Neuro: alert & oriented x 3, cranial nerves grossly intact. moves all 4 extremities w/o difficulty. Affect pleasant.   ASSESSMENT & PLAN:    1. Breast cancer: She was treated with Adriamycin/Cytoxan in 5/17, this treatment has been completed.  She was then started on Taxol/Herceptin/Perjeta.  Herceptin to continue a year until 7/18. I reviewed today's echo: EF and strain parameters stable.  She will continue Herceptin and return for followup office visit and echo in 7/18 after completing Herceptin. 2. HTN: Cough with lisinopril.  I will stop lisinopril and start her on amlodipine 5 mg daily.   [name] 08/25/2016

## 2016-08-25 NOTE — Patient Instructions (Signed)
STOP taking lisinopril  START taking amlodipine 5 mg (1 Tablet) Once Daily  Follow up and Echo in 3 Months

## 2016-08-27 ENCOUNTER — Other Ambulatory Visit: Payer: Self-pay | Admitting: Emergency Medicine

## 2016-08-27 DIAGNOSIS — C50012 Malignant neoplasm of nipple and areola, left female breast: Principal | ICD-10-CM

## 2016-08-27 DIAGNOSIS — Z170 Estrogen receptor positive status [ER+]: Principal | ICD-10-CM

## 2016-08-27 DIAGNOSIS — C50011 Malignant neoplasm of nipple and areola, right female breast: Secondary | ICD-10-CM

## 2016-08-27 NOTE — Assessment & Plan Note (Signed)
Treatment Summary: 1. Neoadjuvant chemotherapy with either AC followed by THP started 09/13/2015 and completed 12 weeks of Taxol 11/29/2015, Herceptin Perjeta 6; now on Herceptin  2. Bilateral mastectomies and lymph node surgery 02/11/16 3. Adjuvant radiation 04/07/2016 to 05/15/2016 4. Adjuvant antiestrogen therapy with Letrozole started 06/26/2016 5. 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 Current treatment:  1. Adjuvant antiestrogen therapy with letrozole 2.5 mg daily started 06/26/2016 2. Herceptin maintenancel completed May 2018 Discussed with her pros and cons of neratinib

## 2016-08-28 ENCOUNTER — Ambulatory Visit (HOSPITAL_BASED_OUTPATIENT_CLINIC_OR_DEPARTMENT_OTHER): Payer: Self-pay

## 2016-08-28 ENCOUNTER — Ambulatory Visit: Payer: Self-pay

## 2016-08-28 ENCOUNTER — Ambulatory Visit (HOSPITAL_BASED_OUTPATIENT_CLINIC_OR_DEPARTMENT_OTHER): Payer: Self-pay | Admitting: Hematology and Oncology

## 2016-08-28 ENCOUNTER — Other Ambulatory Visit (HOSPITAL_BASED_OUTPATIENT_CLINIC_OR_DEPARTMENT_OTHER): Payer: Self-pay

## 2016-08-28 ENCOUNTER — Encounter: Payer: Self-pay | Admitting: Hematology and Oncology

## 2016-08-28 DIAGNOSIS — C50012 Malignant neoplasm of nipple and areola, left female breast: Secondary | ICD-10-CM

## 2016-08-28 DIAGNOSIS — Z5112 Encounter for antineoplastic immunotherapy: Secondary | ICD-10-CM

## 2016-08-28 DIAGNOSIS — C50011 Malignant neoplasm of nipple and areola, right female breast: Secondary | ICD-10-CM

## 2016-08-28 DIAGNOSIS — C50811 Malignant neoplasm of overlapping sites of right female breast: Secondary | ICD-10-CM

## 2016-08-28 DIAGNOSIS — Z170 Estrogen receptor positive status [ER+]: Secondary | ICD-10-CM

## 2016-08-28 DIAGNOSIS — Z171 Estrogen receptor negative status [ER-]: Principal | ICD-10-CM

## 2016-08-28 DIAGNOSIS — C50812 Malignant neoplasm of overlapping sites of left female breast: Principal | ICD-10-CM

## 2016-08-28 DIAGNOSIS — Z79811 Long term (current) use of aromatase inhibitors: Secondary | ICD-10-CM

## 2016-08-28 DIAGNOSIS — Z95828 Presence of other vascular implants and grafts: Secondary | ICD-10-CM

## 2016-08-28 DIAGNOSIS — G620 Drug-induced polyneuropathy: Secondary | ICD-10-CM

## 2016-08-28 LAB — COMPREHENSIVE METABOLIC PANEL
ALBUMIN: 3.8 g/dL (ref 3.5–5.0)
ALK PHOS: 78 U/L (ref 40–150)
ALT: 28 U/L (ref 0–55)
AST: 24 U/L (ref 5–34)
Anion Gap: 8 mEq/L (ref 3–11)
BUN: 15.5 mg/dL (ref 7.0–26.0)
CO2: 26 meq/L (ref 22–29)
Calcium: 9.4 mg/dL (ref 8.4–10.4)
Chloride: 107 mEq/L (ref 98–109)
Creatinine: 0.7 mg/dL (ref 0.6–1.1)
GLUCOSE: 147 mg/dL — AB (ref 70–140)
Potassium: 3.2 mEq/L — ABNORMAL LOW (ref 3.5–5.1)
SODIUM: 141 meq/L (ref 136–145)
Total Bilirubin: 0.66 mg/dL (ref 0.20–1.20)
Total Protein: 7 g/dL (ref 6.4–8.3)

## 2016-08-28 LAB — CBC WITH DIFFERENTIAL/PLATELET
BASO%: 0.4 % (ref 0.0–2.0)
Basophils Absolute: 0 10*3/uL (ref 0.0–0.1)
EOS ABS: 0.1 10*3/uL (ref 0.0–0.5)
EOS%: 1.8 % (ref 0.0–7.0)
HCT: 38.8 % (ref 34.8–46.6)
HGB: 13 g/dL (ref 11.6–15.9)
LYMPH%: 27.3 % (ref 14.0–49.7)
MCH: 28.4 pg (ref 25.1–34.0)
MCHC: 33.5 g/dL (ref 31.5–36.0)
MCV: 84.7 fL (ref 79.5–101.0)
MONO#: 0.3 10*3/uL (ref 0.1–0.9)
MONO%: 6.4 % (ref 0.0–14.0)
NEUT#: 3.2 10*3/uL (ref 1.5–6.5)
NEUT%: 64.1 % (ref 38.4–76.8)
Platelets: 175 10*3/uL (ref 145–400)
RBC: 4.58 10*6/uL (ref 3.70–5.45)
RDW: 12.7 % (ref 11.2–14.5)
WBC: 5 10*3/uL (ref 3.9–10.3)
lymph#: 1.4 10*3/uL (ref 0.9–3.3)

## 2016-08-28 MED ORDER — DIPHENHYDRAMINE HCL 25 MG PO CAPS
50.0000 mg | ORAL_CAPSULE | Freq: Once | ORAL | Status: AC
  Administered 2016-08-28: 50 mg via ORAL

## 2016-08-28 MED ORDER — SODIUM CHLORIDE 0.9 % IV SOLN
Freq: Once | INTRAVENOUS | Status: AC
  Administered 2016-08-28: 11:00:00 via INTRAVENOUS

## 2016-08-28 MED ORDER — ACETAMINOPHEN 325 MG PO TABS
ORAL_TABLET | ORAL | Status: AC
  Filled 2016-08-28: qty 2

## 2016-08-28 MED ORDER — ACETAMINOPHEN 325 MG PO TABS
650.0000 mg | ORAL_TABLET | Freq: Once | ORAL | Status: AC
  Administered 2016-08-28: 650 mg via ORAL

## 2016-08-28 MED ORDER — SODIUM CHLORIDE 0.9% FLUSH
10.0000 mL | INTRAVENOUS | Status: DC | PRN
  Administered 2016-08-28: 10 mL
  Filled 2016-08-28: qty 10

## 2016-08-28 MED ORDER — SODIUM CHLORIDE 0.9% FLUSH
10.0000 mL | INTRAVENOUS | Status: DC | PRN
  Administered 2016-08-28: 10 mL via INTRAVENOUS
  Filled 2016-08-28: qty 10

## 2016-08-28 MED ORDER — HEPARIN SOD (PORK) LOCK FLUSH 100 UNIT/ML IV SOLN
500.0000 [IU] | Freq: Once | INTRAVENOUS | Status: DC | PRN
  Filled 2016-08-28: qty 5

## 2016-08-28 MED ORDER — TRASTUZUMAB CHEMO 150 MG IV SOLR
6.0000 mg/kg | Freq: Once | INTRAVENOUS | Status: AC
  Administered 2016-08-28: 588 mg via INTRAVENOUS
  Filled 2016-08-28: qty 28

## 2016-08-28 MED ORDER — DIPHENHYDRAMINE HCL 25 MG PO CAPS
ORAL_CAPSULE | ORAL | Status: AC
  Filled 2016-08-28: qty 2

## 2016-08-28 NOTE — Progress Notes (Signed)
Patient Care Team: Arnoldo Morale, MD as PCP - General (Family Medicine)  DIAGNOSIS:  Encounter Diagnosis  Name Primary?  . 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, estrogen receptor positive ([place])     SUMMARY OF ONCOLOGIC HISTORY:   Bilateral breast cancer ([place])   08/16/2015 Mammogram    Left breast LIQ 4.2 cm, 4 mm oval mass 12:00, 1.2 cm mass left UOQ, enlarged left axillary lymph node      08/16/2015 Mammogram    Right breast UIQ: 3.9 cm irregular mass, 1.5 cm mass in the right LOQ, 9 mm mass right UOQ, 6 mm mass right LIQ, enlarged lymph node Rt axilla      08/20/2015 Initial Diagnosis    Right breast biopsy 1:00: IDC grade 2 ER 95%, PR 2%, Ki 67 15%; HER-2 negative ratio 1.04; 10:00: IDC grade 2-3 dissimilar to 1:00 biopsy 10:00: ER 95%, PR 0%, K67: 25%, HER-2 positive ratio 6.42 right axillary lymph node positive for metastatic cancer      08/20/2015 Procedure    Left biopsy 8:00: IDC with DCIS, grade 2; ER 95%, PR 95%, Ki-67 30%, HER-2 negative ratio 1.2; 12:00: IDC with DCIS, grade 1 ER 95%, PR 95%, Ki-67 10% HER-2 negative ratio 1.14 not similar to this biopsy, left axillary lymph node positive,       09/13/2015 - 01/17/2016 Neo-Adjuvant Chemotherapy    Neoadjuvant dose dense Adriamycin and Cytoxan followed by Taxol Herceptin Perjeta, followed by Herceptin maintenance      01/21/2016 Breast MRI    Bilateral breast masses significantly decreased in size with mild residual NME, complete resolution of numerous other nodules in both breasts, bilateral axillary lymph nodes and bilateral retropectoral lymph nodes decreased in size      02/11/2016 Surgery    Right mastectomy: IDC 2 nodules, 2.5, 1 cm, margins negative, 3/7 lymph nodes positive T2 N1a stage II B, ER 95%, PR 2%, HER-2 negative, Ki-67 15% RCB burden 3.1 and 2.9 class II      02/11/2016 Surgery    Left mastectomy: IDC 3.1 cm with DCIS, margins negative, 0/8 lymph nodes, T2 N0 stage II a, RCB burden  2.18, class II, ER 100%, PR 95%, HER-2 negative ratio 1.2, Ki-67 30%       04/07/2016 - 05/15/2016 Radiation Therapy    Adjuvant radiation      06/26/2016 -  Anti-estrogen oral therapy    Letrozole 2.5 mg daily       CHIEF COMPLIANT:Herceptin maintenance  INTERVAL HISTORY: Felicia [name] is a 56 yr old lady with above-mentioned history of bilateral breast cancers treated with neoadjuvant chemotherapy followed by bilateral mastectomies and radiation. She is currently on Herceptin maintenance until July 2018. She has been tolerating antiestrogen therapy with letrozole fairly well.  REVIEW OF SYSTEMS:   Constitutional: Denies fevers, chills or abnormal weight loss Eyes: Denies blurriness of vision Ears, nose, mouth, throat, and face: Denies mucositis or sore throat Respiratory: Denies cough, dyspnea or wheezes Cardiovascular: Denies palpitation, chest discomfort Gastrointestinal:  Denies nausea, heartburn or change in bowel habits Skin: Denies abnormal skin rashes Lymphatics: Denies new lymphadenopathy or easy bruising Neurological:Denies numbness, tingling or new weaknesses Behavioral/Psych: Mood is stable, no new changes  Extremities: No lower extremity edema All other systems were reviewed with the patient and are negative.  I have reviewed the past medical history, past surgical history, social history and family history with the patient and they are unchanged from previous note.  ALLERGIES:  is allergic to  no known allergies.  MEDICATIONS:  Current Outpatient Prescriptions  Medication Sig Dispense Refill  . amLODipine (NORVASC) 5 MG tablet Take 1 tablet (5 mg total) by mouth daily. 30 tablet 3  . letrozole (FEMARA) 2.5 MG tablet Take 1 tablet (2.5 mg total) by mouth daily. 90 tablet 3  . lidocaine-prilocaine (EMLA) cream Apply 1 application topically as needed (Apply 2- hours prior to access of port). 30 g 1   No current facility-administered medications for this visit.     Facility-Administered Medications Ordered in Other Visits  Medication Dose Route Frequency Provider Last Rate Last Dose  . heparin lock flush 100 unit/mL  500 Units Intracatheter Once PRN Serena Croissant, MD      . sodium chloride flush (NS) 0.9 % injection 10 mL  10 mL Intracatheter PRN Serena Croissant, MD   10 mL at 08/28/16 1238    PHYSICAL EXAMINATION: ECOG PERFORMANCE STATUS: 1 - Symptomatic but completely ambulatory  Vitals:   08/28/16 1027  BP: (!) 162/65  Pulse: 62  Resp: 18  Temp: 97.7 F (36.5 C)   Filed Weights   08/28/16 1027  Weight: 221 lb 1.6 oz (100.3 kg)    GENERAL:alert, no distress and comfortable SKIN: skin color, texture, turgor are normal, no rashes or significant lesions EYES: normal, Conjunctiva are pink and non-injected, sclera clear OROPHARYNX:no exudate, no erythema and lips, buccal mucosa, and tongue normal  NECK: supple, thyroid normal size, non-tender, without nodularity LYMPH:  no palpable lymphadenopathy in the cervical, axillary or inguinal LUNGS: clear to auscultation and percussion with normal breathing effort HEART: regular rate & rhythm and no murmurs and no lower extremity edema ABDOMEN:abdomen soft, non-tender and normal bowel sounds MUSCULOSKELETAL:no cyanosis of digits and no clubbing  NEURO: alert & oriented x 3 with fluent speech, no focal motor/sensory deficits EXTREMITIES: No lower extremity edema BREAST: Bilateral mastectomies with the skin discoloration. (exam performed in the presence of a chaperone)  LABORATORY DATA:  I have reviewed the data as listed   Chemistry      Component Value Date/Time   NA 141 08/28/2016 0955   K 3.2 (L) 08/28/2016 0955   CL 107 02/12/2016 0606   CO2 26 08/28/2016 0955   BUN 15.5 08/28/2016 0955   CREATININE 0.7 08/28/2016 0955      Component Value Date/Time   CALCIUM 9.4 08/28/2016 0955   ALKPHOS 78 08/28/2016 0955   AST 24 08/28/2016 0955   ALT 28 08/28/2016 0955   BILITOT 0.66 08/28/2016  0955       Lab Results  Component Value Date   WBC 5.0 08/28/2016   HGB 13.0 08/28/2016   HCT 38.8 08/28/2016   MCV 84.7 08/28/2016   PLT 175 08/28/2016   NEUTROABS 3.2 08/28/2016    ASSESSMENT & PLAN:  Bilateral breast cancer ([facility]) Treatment Summary: 1. Neoadjuvant chemotherapy with either AC followed by THP started 09/13/2015 and completed 12 weeks of Taxol 11/29/2015, Herceptin Perjeta 6; now on Herceptin  2. Bilateral mastectomies and lymph node surgery 02/11/16 3. Adjuvant radiation 04/07/2016 to 05/15/2016 4. Adjuvant antiestrogen therapy with Letrozole started 06/26/2016 5. 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 Current treatment:  1. Adjuvant antiestrogen therapy with letrozole 2.5 mg daily started 06/26/2016 2. Herceptin maintenancel to be completed July 2018 Discussed with her pros and cons of neratinib and provided her with literature  Neratinib counseling: Neratinib is up and HER-2 inhibitor that is currently approved for treatment of HER-2 positive breast cancer after conclusion of one year of Herceptin treatment. Based on the studies it does appear to have a progression free survival difference of 2% absolute value. The starting dose of the spill is 200 mg once daily. The greatest side effect of this medication is diarrhea. Patient will need to be on prophylactic antidiarrheal medications. Days 1 to 14: Loperamide 4 mg orally 3 times daily Days 15 to 56: Loperamide 4 mg orally twice daily Days 57 to 365: Loperamide  4 mg as needed (maximum: 16 mg/day) During the treatment will have to monitor the liver functions. Apart from the diarrhea other side effects include fatigue 27%, skin rash 18%, muscle spasms in 11%.  Return to clinic every 3 weeks for Herceptin and every 6 weeks to see me  I spent 25 minutes talking to the patient of which more than half was spent in counseling and coordination of care.  No orders of the defined types were placed in this encounter.  The patient has a good understanding of the overall plan. she agrees with it. she will call with any problems that may develop before the next visit here.   Rulon Eisenmenger, MD 08/28/16

## 2016-08-28 NOTE — Patient Instructions (Signed)
[facility] Discharge Instructions for Patients Receiving Chemotherapy  Today you received the following chemotherapy agents: Herceptin   To help prevent nausea and vomiting after your treatment, we encourage you to take your nausea medication as directed.    If you develop nausea and vomiting that is not controlled by your nausea medication, call the clinic.   BELOW ARE SYMPTOMS THAT SHOULD BE REPORTED IMMEDIATELY:  *FEVER GREATER THAN 100.5 F  *CHILLS WITH OR WITHOUT FEVER  NAUSEA AND VOMITING THAT IS NOT CONTROLLED WITH YOUR NAUSEA MEDICATION  *UNUSUAL SHORTNESS OF BREATH  *UNUSUAL BRUISING OR BLEEDING  TENDERNESS IN MOUTH AND THROAT WITH OR WITHOUT PRESENCE OF ULCERS  *URINARY PROBLEMS  *BOWEL PROBLEMS  UNUSUAL RASH Items with * indicate a potential emergency and should be followed up as soon as possible.  Feel free to call the clinic you have any questions or concerns. The clinic phone number is [phone].  Please show the CHEMO ALERT CARD at check-in to the Emergency Department and triage nurse.   

## 2016-08-28 NOTE — Patient Instructions (Signed)
Implanted Port Home Guide An implanted port is a type of central line that is placed under the skin. Central lines are used to provide IV access when treatment or nutrition needs to be given through a person's veins. Implanted ports are used for long-term IV access. An implanted port may be placed because:  You need IV medicine that would be irritating to the small veins in your hands or arms.  You need long-term IV medicines, such as antibiotics.  You need IV nutrition for a long period.  You need frequent blood draws for lab tests.  You need dialysis.  Implanted ports are usually placed in the chest area, but they can also be placed in the upper arm, the abdomen, or the leg. An implanted port has two main parts:  Reservoir. The reservoir is round and will appear as a small, raised area under your skin. The reservoir is the part where a needle is inserted to give medicines or draw blood.  Catheter. The catheter is a thin, flexible tube that extends from the reservoir. The catheter is placed into a large vein. Medicine that is inserted into the reservoir goes into the catheter and then into the vein.  How will I care for my incision site? Do not get the incision site wet. Bathe or shower as directed by your health care provider. How is my port accessed? Special steps must be taken to access the port:  Before the port is accessed, a numbing cream can be placed on the skin. This helps numb the skin over the port site.  Your health care provider uses a sterile technique to access the port. ? Your health care provider must put on a mask and sterile gloves. ? The skin over your port is cleaned carefully with an antiseptic and allowed to dry. ? The port is gently pinched between sterile gloves, and a needle is inserted into the port.  Only "non-coring" port needles should be used to access the port. Once the port is accessed, a blood return should be checked. This helps ensure that the port  is in the vein and is not clogged.  If your port needs to remain accessed for a constant infusion, a clear (transparent) bandage will be placed over the needle site. The bandage and needle will need to be changed every week, or as directed by your health care provider.  Keep the bandage covering the needle clean and dry. Do not get it wet. Follow your health care provider's instructions on how to take a shower or bath while the port is accessed.  If your port does not need to stay accessed, no bandage is needed over the port.  What is flushing? Flushing helps keep the port from getting clogged. Follow your health care provider's instructions on how and when to flush the port. Ports are usually flushed with saline solution or a medicine called heparin. The need for flushing will depend on how the port is used.  If the port is used for intermittent medicines or blood draws, the port will need to be flushed: ? After medicines have been given. ? After blood has been drawn. ? As part of routine maintenance.  If a constant infusion is running, the port may not need to be flushed.  How long will my port stay implanted? The port can stay in for as long as your health care provider thinks it is needed. When it is time for the port to come out, surgery will be   done to remove it. The procedure is similar to the one performed when the port was put in. When should I seek immediate medical care? When you have an implanted port, you should seek immediate medical care if:  You notice a bad smell coming from the incision site.  You have swelling, redness, or drainage at the incision site.  You have more swelling or pain at the port site or the surrounding area.  You have a fever that is not controlled with medicine.  This information is not intended to replace advice given to you by your health care provider. Make sure you discuss any questions you have with your health care provider. Document  Released: 04/14/2005 Document Revised: 09/20/2015 Document Reviewed: 12/20/2012 Elsevier Interactive Patient Education  2017 [employer]  

## 2016-09-03 ENCOUNTER — Ambulatory Visit: Payer: Self-pay | Attending: Family Medicine | Admitting: Family Medicine

## 2016-09-03 ENCOUNTER — Encounter: Payer: Self-pay | Admitting: Family Medicine

## 2016-09-03 VITALS — BP 166/94 | HR 60 | Temp 97.6°F | Wt 223.6 lb

## 2016-09-03 DIAGNOSIS — C50812 Malignant neoplasm of overlapping sites of left female breast: Secondary | ICD-10-CM

## 2016-09-03 DIAGNOSIS — C50811 Malignant neoplasm of overlapping sites of right female breast: Secondary | ICD-10-CM

## 2016-09-03 DIAGNOSIS — Z79899 Other long term (current) drug therapy: Secondary | ICD-10-CM | POA: Insufficient documentation

## 2016-09-03 DIAGNOSIS — E876 Hypokalemia: Secondary | ICD-10-CM

## 2016-09-03 DIAGNOSIS — I10 Essential (primary) hypertension: Secondary | ICD-10-CM

## 2016-09-03 DIAGNOSIS — Z9013 Acquired absence of bilateral breasts and nipples: Secondary | ICD-10-CM | POA: Insufficient documentation

## 2016-09-03 DIAGNOSIS — Z853 Personal history of malignant neoplasm of breast: Secondary | ICD-10-CM | POA: Insufficient documentation

## 2016-09-03 DIAGNOSIS — K219 Gastro-esophageal reflux disease without esophagitis: Secondary | ICD-10-CM | POA: Insufficient documentation

## 2016-09-03 DIAGNOSIS — Z923 Personal history of irradiation: Secondary | ICD-10-CM | POA: Insufficient documentation

## 2016-09-03 MED ORDER — POTASSIUM CHLORIDE CRYS ER 10 MEQ PO TBCR
10.0000 meq | EXTENDED_RELEASE_TABLET | Freq: Two times a day (BID) | ORAL | 3 refills | Status: DC
Start: 2016-09-03 — End: 2016-11-25

## 2016-09-03 MED ORDER — AMLODIPINE BESYLATE 10 MG PO TABS: 10.0000 mg | ORAL_TABLET | Freq: Every day | ORAL | 3 refills | Status: DC

## 2016-09-03 NOTE — Progress Notes (Signed)
Subjective:  Patient ID: Felicia [name], female    DOB: 1961/01/19  Age: 56 y.o. MRN: [id number]  CC: Hypertension   HPI Felicia [name] is 56 year old female with a history of GERD, bilateral breast cancers diagnosed in 07/2015 (status post bilateral mastectomy and radiation), status post bilateral lymph node excision currently undergoing adjuvant antiestrogen therapy and Herceptin maintenance who comes into the clinic to follow up on Hypertension.  Placed on Lisinopril at her last visit which she could not tolerate due to cough and dizziness and this was switched to Amlodipine by Cardiology. She reports doing well on Amlodipine but her blood pressure is still elevated.  Last set of labs revealed hypokalemia of 3.2 and she was advised by the cancer center to increase intake of potassium rich foods   She is accompanied by her daughter and has no additional concerns at this time.   Past Medical History:  Diagnosis Date  . Cancer ([place])    breast  . GERD (gastroesophageal reflux disease)     Past Surgical History:  Procedure Laterality Date  . MASTECTOMY MODIFIED RADICAL Bilateral 02/11/2016  . MASTECTOMY MODIFIED RADICAL Bilateral 02/11/2016   Procedure: BILATERAL MASTECTOMY MODIFIED RADICAL;  Surgeon: Rolm Bookbinder, MD;  Location: [facility];  Service: General;  Laterality: Bilateral;  . PORTACATH PLACEMENT N/A 09/04/2015   Procedure: INSERTION PORT-A-CATH WITH [place];  Surgeon: Rolm Bookbinder, MD;  Location: [facility];  Service: General;  Laterality: N/A;  . TUBAL LIGATION      Allergies  Allergen Reactions  . No Known Allergies      Outpatient Medications Prior to Visit  Medication Sig Dispense Refill  . letrozole (FEMARA) 2.5 MG tablet Take 1 tablet (2.5 mg total) by mouth daily. 90 tablet 3  . amLODipine (NORVASC) 5 MG tablet Take 1 tablet (5 mg total) by mouth daily. 30 tablet 3  . lidocaine-prilocaine (EMLA) cream Apply 1 application topically as needed  (Apply 2- hours prior to access of port). (Patient not taking: Reported on 09/03/2016) 30 g 1   No facility-administered medications prior to visit.     ROS Review of Systems Constitutional: Negative for activity change, appetite change and fatigue.  HENT: Negative for congestion, sinus pressure and sore throat.   Eyes: Negative for visual disturbance.  Respiratory: Negative for cough, chest tightness, shortness of breath and wheezing.   Cardiovascular: Negative for chest pain and palpitations.  Gastrointestinal: Negative for abdominal distention, abdominal pain and constipation.  Endocrine: Negative for polydipsia.  Genitourinary: Negative for dysuria and frequency.  Musculoskeletal: Negative for arthralgias and back pain.  Skin: Negative for rash.  Neurological: Positive for numbness. Negative for tremors and light-headedness.  Hematological: Does not bruise/bleed easily.  Psychiatric/Behavioral: Negative for agitation and behavioral problems.  Objective:  BP (!) 166/94   Pulse 60   Temp 97.6 F (36.4 C) (Oral)   Wt 223 lb 9.6 oz (101.4 kg)   SpO2 96%   BMI 40.90 kg/m   BP/Weight 09/03/2016 08/28/2016 07/11/1759  Systolic BP 607 371 062  Diastolic BP 94 65 86  Wt. (Lbs) 223.6 221.1 222.5  BMI 40.9 40.44 40.7      Physical Exam Constitutional: She is oriented to person, place, and time. She appears well-developed and well-nourished.  Cardiovascular: Normal rate, normal heart sounds and intact distal pulses.   No murmur heard. Pulmonary/Chest: Effort normal and breath sounds normal. She has no wheezes. She has no rales. She exhibits no tenderness.  Bilateral Mastectomy Right IJ port in place  Abdominal: Soft. Bowel sounds are normal. She exhibits no distension and no mass. There is no tenderness.  Musculoskeletal: Normal range of motion.  Neurological: She is alert and oriented to person, place, and time.   CMP Latest Ref Rng & Units 08/28/2016 06/26/2016 05/16/2016  Glucose  70 - 140 mg/dl 147(H) 84 97  BUN 7.0 - 26.0 mg/dL 15.5 11.2 11.2  Creatinine 0.6 - 1.1 mg/dL 0.7 0.7 0.6  Sodium 136 - 145 mEq/L 141 140 139  Potassium 3.5 - 5.1 mEq/L 3.2(L) 3.9 3.9  Chloride 101 - 111 mmol/L - - -  CO2 22 - 29 mEq/L 26 26 24   Calcium 8.4 - 10.4 mg/dL 9.4 9.7 9.6  Total Protein 6.4 - 8.3 g/dL 7.0 7.2 7.4  Total Bilirubin 0.20 - 1.20 mg/dL 0.66 0.72 0.57  Alkaline Phos 40 - 150 U/L 78 91 94  AST 5 - 34 U/L 24 26 22   ALT 0 - 55 U/L 28 27 24      Assessment & Plan:   1. Essential hypertension Uncontrolled Low sodium diet Lifestyle modifications If she complains of dizziness I will reduce the dose of her Amlodipine as leg blood pressures tend to be 10 -20% higher higher than brachial blood pressure - amLODipine (NORVASC) 10 MG tablet; Take 1 tablet (10 mg total) by mouth daily.  Dispense: 30 tablet; Refill: 3  2. Bilateral malignant neoplasm of overlapping sites of breast in female, unspecified estrogen receptor status ([place]) Currently on Herceptin therapy Continue Femara  3. Hypokalemia Increase intake of Potassium rich foods - potassium chloride (K-DUR,KLOR-CON) 10 MEQ tablet; Take 1 tablet (10 mEq total) by mouth 2 (two) times daily.  Dispense: 30 tablet; Refill: 3   Meds ordered this encounter  Medications  . amLODipine (NORVASC) 10 MG tablet    Sig: Take 1 tablet (10 mg total) by mouth daily.    Dispense:  30 tablet    Refill:  3  . potassium chloride (K-DUR,KLOR-CON) 10 MEQ tablet    Sig: Take 1 tablet (10 mEq total) by mouth 2 (two) times daily.    Dispense:  30 tablet    Refill:  3    Follow-up: Return in about 6 weeks (around 10/15/2016) for follow up of Hypertension.   Arnoldo Morale MD

## 2016-09-03 NOTE — Patient Instructions (Signed)
Hipokalemia (Hypokalemia) Hipokalemia significa que el nivel de potasio en sangre es menor que lo normal. El potasio es un electrolito que ayuda a regular la cantidad de lquido del organismo. Tambin estimula la contraccin muscular y ayuda a que la funcin muscular sea la [place]. La Delorise Shiner del potasio del organismo se encuentra dentro de las clulas y slo una pequea cantidad en la sangre. Debido a que la cantidad en la sangre es muy pequea, pequeos cambios en la sangre pueden poner en peligro la vida. CAUSAS  Antibiticos.  Diarrea o vmitos.  El uso excesivo de laxantes, lo que puede causar diarrea.  Enfermedad renal crnica.  Uso de diurticos.  Trastornos de Youth worker (bulimia).  Bajos niveles de magnesio.  Sudoracin abundante. [place].  Estreimiento.  Fatiga.  Calambres musculares.  Confusin mental.  Latidos cardacos salteados o irregulares (palpitaciones).  Hormigueo o adormecimiento. DIAGNSTICO El mdico puede diagnosticar hipokalemia por los anlisis de [place]. Adems para controlar sus niveles de potasio, el mdico podr ordenar otros anlisis de laboratorio. TRATAMIENTO La hipokalemia puede tratarse con suplementos de potasio por va oral o realizando ajustes en sus medicamentos habituales. Si sus niveles de potasio son muy bajos, ser necesario que lo reciba a travs de una vena (IV) y se lo controle en el hospital. Ardelia Mems dieta rica en potasio tambin puede ser de Hypericum. Los alimentos ricos en potasio son:  [place] Pert secos, como cacahuetes y pistachos.  Semillas, como semillas de girasol y de Scientific laboratory technician.  Porotos, guisantes secos y lentejas.  Granos enteros y panes y cereales con salvado.  Lambert Mody y vegetales frescos como damascos, avocado, bananas, meln, kiwi, naranjas, esprragos y patatas.  Jugos de naranja y tomates.  Carnes rojas.  Yogur con frutas. INSTRUCCIONES PARA EL CUIDADO EN EL HOGAR  Tome todos los  [employer] indic el mdico.  Siga una dieta saludable e incluya alimentos nutritivos como frutas, vegetales, nueces, granos enteros y carnes Herman.  Si est tomando laxantes, asegrese de seguir las instrucciones del envase. SOLICITE ATENCIN MDICA SI:  La debilidad empeora.  Siente que el corazn late fuerte o est acelerado.  Vomita o tiene diarrea.  Tiene problemas para mantener su nivel de glucosa en el rango normal. SOLICITE ATENCIN MDICA DE INMEDIATO SI:  Siente dolor en el pecho, le falta de aire o se siente mareado.  Vomita o tiene diarrea durante ms de 2 das.  Se desmaya. ASEGRESE DE QUE:  Comprende estas instrucciones.  Controlar su afeccin.  Recibir ayuda de inmediato si no mejora o si empeora. Esta informacin no tiene Marine scientist el consejo del mdico. Asegrese de hacerle al mdico cualquier pregunta que tenga. Document Released: 04/14/2005 Document Revised: 05/05/2014 Document Reviewed: 10/24/2015 Elsevier Interactive Patient Education  2017 [employer].

## 2016-09-18 ENCOUNTER — Ambulatory Visit (HOSPITAL_BASED_OUTPATIENT_CLINIC_OR_DEPARTMENT_OTHER): Payer: Self-pay

## 2016-09-18 ENCOUNTER — Other Ambulatory Visit: Payer: Self-pay

## 2016-09-18 ENCOUNTER — Telehealth: Payer: Self-pay

## 2016-09-18 VITALS — BP 163/69 | HR 68 | Temp 98.0°F | Resp 20

## 2016-09-18 DIAGNOSIS — C50812 Malignant neoplasm of overlapping sites of left female breast: Principal | ICD-10-CM

## 2016-09-18 DIAGNOSIS — C50811 Malignant neoplasm of overlapping sites of right female breast: Secondary | ICD-10-CM

## 2016-09-18 DIAGNOSIS — C50012 Malignant neoplasm of nipple and areola, left female breast: Secondary | ICD-10-CM

## 2016-09-18 DIAGNOSIS — C50011 Malignant neoplasm of nipple and areola, right female breast: Secondary | ICD-10-CM

## 2016-09-18 DIAGNOSIS — Z171 Estrogen receptor negative status [ER-]: Principal | ICD-10-CM

## 2016-09-18 DIAGNOSIS — Z170 Estrogen receptor positive status [ER+]: Secondary | ICD-10-CM

## 2016-09-18 DIAGNOSIS — Z5112 Encounter for antineoplastic immunotherapy: Secondary | ICD-10-CM

## 2016-09-18 LAB — URINALYSIS, MICROSCOPIC - CHCC
Bilirubin (Urine): NEGATIVE
GLUCOSE UR CHCC: NEGATIVE mg/dL
Ketones: NEGATIVE mg/dL
NITRITE: NEGATIVE
PROTEIN: NEGATIVE mg/dL
Specific Gravity, Urine: 1.005 (ref 1.003–1.035)
Urobilinogen, UR: 0.2 mg/dL (ref 0.2–1)
pH: 7.5 (ref 4.6–8.0)

## 2016-09-18 MED ORDER — SODIUM CHLORIDE 0.9 % IV SOLN
Freq: Once | INTRAVENOUS | Status: AC
  Administered 2016-09-18: 11:00:00 via INTRAVENOUS

## 2016-09-18 MED ORDER — TRASTUZUMAB CHEMO 150 MG IV SOLR
6.0000 mg/kg | Freq: Once | INTRAVENOUS | Status: AC
  Administered 2016-09-18: 588 mg via INTRAVENOUS
  Filled 2016-09-18: qty 28

## 2016-09-18 MED ORDER — ACETAMINOPHEN 325 MG PO TABS
ORAL_TABLET | ORAL | Status: AC
  Filled 2016-09-18: qty 2

## 2016-09-18 MED ORDER — HEPARIN SOD (PORK) LOCK FLUSH 100 UNIT/ML IV SOLN
500.0000 [IU] | Freq: Once | INTRAVENOUS | Status: AC | PRN
  Administered 2016-09-18: 500 [IU]
  Filled 2016-09-18: qty 5

## 2016-09-18 MED ORDER — SODIUM CHLORIDE 0.9% FLUSH
10.0000 mL | INTRAVENOUS | Status: DC | PRN
  Filled 2016-09-18: qty 10

## 2016-09-18 MED ORDER — ACETAMINOPHEN 325 MG PO TABS
650.0000 mg | ORAL_TABLET | Freq: Once | ORAL | Status: AC
  Administered 2016-09-18: 650 mg via ORAL

## 2016-09-18 MED ORDER — DIPHENHYDRAMINE HCL 25 MG PO CAPS
50.0000 mg | ORAL_CAPSULE | Freq: Once | ORAL | Status: AC
  Administered 2016-09-18: 50 mg via ORAL

## 2016-09-18 MED ORDER — DIPHENHYDRAMINE HCL 25 MG PO CAPS
ORAL_CAPSULE | ORAL | Status: AC
  Filled 2016-09-18: qty 2

## 2016-09-18 NOTE — Progress Notes (Signed)
Pt complains of burning and frequency with urination for 1-2 days. Checked pt for UTI. Preliminary results negative. No antibiotics needed at this time. Advised pt to increase hydration intake and may take cranberry juice and yogurt to help with urinary tract health. If pt symptoms gets worse, pt encouraged to contact provider. Pt verbalized understanding. ( Interpreter with pt)

## 2016-09-18 NOTE — Telephone Encounter (Signed)
error 

## 2016-09-18 NOTE — Patient Instructions (Signed)
[facility] Discharge Instructions for Patients Receiving Chemotherapy  Today you received the following chemotherapy agents: Herceptin   To help prevent nausea and vomiting after your treatment, we encourage you to take your nausea medication as directed.    If you develop nausea and vomiting that is not controlled by your nausea medication, call the clinic.   BELOW ARE SYMPTOMS THAT SHOULD BE REPORTED IMMEDIATELY:  *FEVER GREATER THAN 100.5 F  *CHILLS WITH OR WITHOUT FEVER  NAUSEA AND VOMITING THAT IS NOT CONTROLLED WITH YOUR NAUSEA MEDICATION  *UNUSUAL SHORTNESS OF BREATH  *UNUSUAL BRUISING OR BLEEDING  TENDERNESS IN MOUTH AND THROAT WITH OR WITHOUT PRESENCE OF ULCERS  *URINARY PROBLEMS  *BOWEL PROBLEMS  UNUSUAL RASH Items with * indicate a potential emergency and should be followed up as soon as possible.  Feel free to call the clinic you have any questions or concerns. The clinic phone number is [phone].  Please show the CHEMO ALERT CARD at check-in to the Emergency Department and triage nurse.   

## 2016-09-19 LAB — URINE CULTURE

## 2016-10-08 ENCOUNTER — Other Ambulatory Visit: Payer: Self-pay

## 2016-10-08 DIAGNOSIS — C50811 Malignant neoplasm of overlapping sites of right female breast: Secondary | ICD-10-CM

## 2016-10-08 DIAGNOSIS — C50812 Malignant neoplasm of overlapping sites of left female breast: Principal | ICD-10-CM

## 2016-10-09 ENCOUNTER — Ambulatory Visit (HOSPITAL_BASED_OUTPATIENT_CLINIC_OR_DEPARTMENT_OTHER): Payer: Self-pay

## 2016-10-09 ENCOUNTER — Ambulatory Visit: Payer: Self-pay

## 2016-10-09 ENCOUNTER — Ambulatory Visit (HOSPITAL_BASED_OUTPATIENT_CLINIC_OR_DEPARTMENT_OTHER): Payer: Self-pay | Admitting: Hematology and Oncology

## 2016-10-09 ENCOUNTER — Encounter: Payer: Self-pay | Admitting: *Deleted

## 2016-10-09 ENCOUNTER — Other Ambulatory Visit (HOSPITAL_BASED_OUTPATIENT_CLINIC_OR_DEPARTMENT_OTHER): Payer: Self-pay

## 2016-10-09 DIAGNOSIS — C50211 Malignant neoplasm of upper-inner quadrant of right female breast: Secondary | ICD-10-CM

## 2016-10-09 DIAGNOSIS — G620 Drug-induced polyneuropathy: Secondary | ICD-10-CM

## 2016-10-09 DIAGNOSIS — C50811 Malignant neoplasm of overlapping sites of right female breast: Secondary | ICD-10-CM

## 2016-10-09 DIAGNOSIS — Z170 Estrogen receptor positive status [ER+]: Secondary | ICD-10-CM

## 2016-10-09 DIAGNOSIS — Z171 Estrogen receptor negative status [ER-]: Principal | ICD-10-CM

## 2016-10-09 DIAGNOSIS — C773 Secondary and unspecified malignant neoplasm of axilla and upper limb lymph nodes: Secondary | ICD-10-CM

## 2016-10-09 DIAGNOSIS — Z5112 Encounter for antineoplastic immunotherapy: Secondary | ICD-10-CM

## 2016-10-09 DIAGNOSIS — C50011 Malignant neoplasm of nipple and areola, right female breast: Secondary | ICD-10-CM

## 2016-10-09 DIAGNOSIS — C50311 Malignant neoplasm of lower-inner quadrant of right female breast: Secondary | ICD-10-CM

## 2016-10-09 DIAGNOSIS — C50812 Malignant neoplasm of overlapping sites of left female breast: Secondary | ICD-10-CM

## 2016-10-09 DIAGNOSIS — C50012 Malignant neoplasm of nipple and areola, left female breast: Principal | ICD-10-CM

## 2016-10-09 DIAGNOSIS — Z95828 Presence of other vascular implants and grafts: Secondary | ICD-10-CM

## 2016-10-09 LAB — COMPREHENSIVE METABOLIC PANEL
ALT: 33 U/L (ref 0–55)
ANION GAP: 9 meq/L (ref 3–11)
AST: 29 U/L (ref 5–34)
Albumin: 3.8 g/dL (ref 3.5–5.0)
Alkaline Phosphatase: 76 U/L (ref 40–150)
BUN: 12.5 mg/dL (ref 7.0–26.0)
CALCIUM: 9.8 mg/dL (ref 8.4–10.4)
CHLORIDE: 107 meq/L (ref 98–109)
CO2: 26 meq/L (ref 22–29)
Creatinine: 0.7 mg/dL (ref 0.6–1.1)
EGFR: >90 mL/min/{1.73_m2} (ref >90)
Glucose: 107 mg/dl (ref 70–140)
POTASSIUM: 3.6 meq/L (ref 3.5–5.1)
Sodium: 142 mEq/L (ref 136–145)
Total Bilirubin: 0.83 mg/dL (ref 0.20–1.20)
Total Protein: 7.3 g/dL (ref 6.4–8.3)

## 2016-10-09 LAB — CBC WITH DIFFERENTIAL/PLATELET
BASO%: 0.6 % (ref 0.0–2.0)
BASOS ABS: 0 10*3/uL (ref 0.0–0.1)
EOS%: 1.5 % (ref 0.0–7.0)
Eosinophils Absolute: 0.1 10*3/uL (ref 0.0–0.5)
HEMATOCRIT: 40.5 % (ref 34.8–46.6)
HGB: 13.7 g/dL (ref 11.6–15.9)
LYMPH#: 1.5 10*3/uL (ref 0.9–3.3)
LYMPH%: 27.1 % (ref 14.0–49.7)
MCH: 28.7 pg (ref 25.1–34.0)
MCHC: 33.7 g/dL (ref 31.5–36.0)
MCV: 85 fL (ref 79.5–101.0)
MONO#: 0.4 10*3/uL (ref 0.1–0.9)
MONO%: 8 % (ref 0.0–14.0)
NEUT#: 3.4 10*3/uL (ref 1.5–6.5)
NEUT%: 62.8 % (ref 38.4–76.8)
PLATELETS: 182 10*3/uL (ref 145–400)
RBC: 4.76 10*6/uL (ref 3.70–5.45)
RDW: 13.4 % (ref 11.2–14.5)
WBC: 5.4 10*3/uL (ref 3.9–10.3)

## 2016-10-09 LAB — DRAW EXTRA CLOT TUBE

## 2016-10-09 MED ORDER — DIPHENHYDRAMINE HCL 25 MG PO CAPS
50.0000 mg | ORAL_CAPSULE | Freq: Once | ORAL | Status: AC
  Administered 2016-10-09: 50 mg via ORAL

## 2016-10-09 MED ORDER — SODIUM CHLORIDE 0.9% FLUSH
10.0000 mL | INTRAVENOUS | Status: DC | PRN
  Administered 2016-10-09: 10 mL
  Filled 2016-10-09: qty 10

## 2016-10-09 MED ORDER — ACETAMINOPHEN 325 MG PO TABS
ORAL_TABLET | ORAL | Status: AC
  Filled 2016-10-09: qty 2

## 2016-10-09 MED ORDER — SODIUM CHLORIDE 0.9% FLUSH
10.0000 mL | INTRAVENOUS | Status: DC | PRN
  Administered 2016-10-09: 10 mL via INTRAVENOUS
  Filled 2016-10-09: qty 10

## 2016-10-09 MED ORDER — ACETAMINOPHEN 325 MG PO TABS
650.0000 mg | ORAL_TABLET | Freq: Once | ORAL | Status: AC
  Administered 2016-10-09: 650 mg via ORAL

## 2016-10-09 MED ORDER — SODIUM CHLORIDE 0.9 % IV SOLN
Freq: Once | INTRAVENOUS | Status: AC
  Administered 2016-10-09: 11:00:00 via INTRAVENOUS

## 2016-10-09 MED ORDER — NERATINIB MALEATE 40 MG PO TABS: 240.0000 mg | ORAL_TABLET | Freq: Every day | ORAL | 3 refills | Status: DC

## 2016-10-09 MED ORDER — HEPARIN SOD (PORK) LOCK FLUSH 100 UNIT/ML IV SOLN
500.0000 [IU] | Freq: Once | INTRAVENOUS | Status: AC | PRN
  Administered 2016-10-09: 500 [IU]
  Filled 2016-10-09: qty 5

## 2016-10-09 MED ORDER — TRASTUZUMAB CHEMO 150 MG IV SOLR
600.0000 mg | Freq: Once | INTRAVENOUS | Status: AC
  Administered 2016-10-09: 600 mg via INTRAVENOUS
  Filled 2016-10-09: qty 28.57

## 2016-10-09 MED ORDER — DIPHENHYDRAMINE HCL 25 MG PO CAPS
ORAL_CAPSULE | ORAL | Status: AC
  Filled 2016-10-09: qty 2

## 2016-10-09 NOTE — Assessment & Plan Note (Signed)
Treatment Summary: 1. Neoadjuvant chemotherapy with either AC followed by THP started 09/13/2015 and completed 12 weeks of Taxol 11/29/2015, Herceptin Perjeta 6; now on Herceptin  2. Bilateral mastectomies and lymph node surgery 02/11/16 3. Adjuvant radiation 04/07/2016 to 05/15/2016 4. Adjuvant antiestrogen therapy with Letrozole started 06/26/2016 5. 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  Current treatment:  1. Adjuvant antiestrogen therapy with letrozole 2.5 mg daily started 06/26/2016 2. Herceptin maintenancel to be completed July 2018  Return to clinic 6 weeks for follow-up with

## 2016-10-09 NOTE — Progress Notes (Signed)
Patient Care Team: Arnoldo Morale, MD as PCP - General (Family Medicine)  DIAGNOSIS:  Encounter Diagnosis  Name Primary?  . 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, estrogen receptor positive ([place])     SUMMARY OF ONCOLOGIC HISTORY:   Bilateral breast cancer ([place])   08/16/2015 Mammogram    Left breast LIQ 4.2 cm, 4 mm oval mass 12:00, 1.2 cm mass left UOQ, enlarged left axillary lymph node      08/16/2015 Mammogram    Right breast UIQ: 3.9 cm irregular mass, 1.5 cm mass in the right LOQ, 9 mm mass right UOQ, 6 mm mass right LIQ, enlarged lymph node Rt axilla      08/20/2015 Initial Diagnosis    Right breast biopsy 1:00: IDC grade 2 ER 95%, PR 2%, Ki 67 15%; HER-2 negative ratio 1.04; 10:00: IDC grade 2-3 dissimilar to 1:00 biopsy 10:00: ER 95%, PR 0%, K67: 25%, HER-2 positive ratio 6.42 right axillary lymph node positive for metastatic cancer      08/20/2015 Procedure    Left biopsy 8:00: IDC with DCIS, grade 2; ER 95%, PR 95%, Ki-67 30%, HER-2 negative ratio 1.2; 12:00: IDC with DCIS, grade 1 ER 95%, PR 95%, Ki-67 10% HER-2 negative ratio 1.14 not similar to this biopsy, left axillary lymph node positive,       09/13/2015 - 01/17/2016 Neo-Adjuvant Chemotherapy    Neoadjuvant dose dense Adriamycin and Cytoxan followed by Taxol Herceptin Perjeta, followed by Herceptin maintenance      01/21/2016 Breast MRI    Bilateral breast masses significantly decreased in size with mild residual NME, complete resolution of numerous other nodules in both breasts, bilateral axillary lymph nodes and bilateral retropectoral lymph nodes decreased in size      02/11/2016 Surgery    Right mastectomy: IDC 2 nodules, 2.5, 1 cm, margins negative, 3/7 lymph nodes positive T2 N1a stage II B, ER 95%, PR 2%, HER-2 negative, Ki-67 15% RCB burden 3.1 and 2.9 class II      02/11/2016 Surgery    Left mastectomy: IDC 3.1 cm with DCIS, margins negative, 0/8 lymph nodes, T2 N0 stage II a, RCB burden  2.18, class II, ER 100%, PR 95%, HER-2 negative ratio 1.2, Ki-67 30%       04/07/2016 - 05/15/2016 Radiation Therapy    Adjuvant radiation      06/26/2016 -  Anti-estrogen oral therapy    Letrozole 2.5 mg daily       CHIEF COMPLIANT: Follow-up on Herceptin and letrozole  INTERVAL HISTORY: Felicia [name] is a 56-year-old with above-mentioned history of bilateral breast cancers who underwent neoadjuvant chemotherapy followed by bilateral mastectomies and radiation. She is currently on letrozole therapy along with Herceptin. Herceptin is likely to complete in July 2018. She has been tolerating the treatment fairly well. She does not have any major hot flashes or myalgias.  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  I  have reviewed the past medical history, past surgical history, social history and family history with the patient and they are unchanged from previous note.  ALLERGIES:  is allergic to no known allergies.  MEDICATIONS:  Current Outpatient Prescriptions  Medication Sig Dispense Refill  . amLODipine (NORVASC) 10 MG tablet Take 1 tablet (10 mg total) by mouth daily. 30 tablet 3  . letrozole (FEMARA) 2.5 MG tablet Take 1 tablet (2.5 mg total) by mouth daily. 90 tablet 3  . lidocaine-prilocaine  (EMLA) cream Apply 1 application topically as needed (Apply 2- hours prior to access of port). (Patient not taking: Reported on 09/03/2016) 30 g 1  . potassium chloride (K-DUR,KLOR-CON) 10 MEQ tablet Take 1 tablet (10 mEq total) by mouth 2 (two) times daily. 30 tablet 3   No current facility-administered medications for this visit.     PHYSICAL EXAMINATION: ECOG PERFORMANCE STATUS: 1 - Symptomatic but completely ambulatory  There were no vitals filed for this visit. There were no vitals filed for this visit.  GENERAL:alert, no distress and comfortable SKIN: skin color, texture, turgor are normal, no rashes or significant lesions EYES: normal, Conjunctiva are pink and non-injected, sclera clear OROPHARYNX:no exudate, no erythema and lips, buccal mucosa, and tongue normal  NECK: supple, thyroid normal size, non-tender, without nodularity LYMPH:  no palpable lymphadenopathy in the cervical, axillary or inguinal LUNGS: clear to auscultation and percussion with normal breathing effort HEART: regular rate & rhythm and no murmurs and no lower extremity edema ABDOMEN:abdomen soft, non-tender and normal bowel sounds MUSCULOSKELETAL:no cyanosis of digits and no clubbing  NEURO: 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 EXTREMITIES: No lower extremity edema   LABORATORY DATA:  I have reviewed the data as listed   Chemistry      Component Value Date/Time   NA 141 08/28/2016 0955   K 3.2 (L) 08/28/2016 0955   CL 107 02/12/2016 0606   CO2 26 08/28/2016 0955   BUN 15.5 08/28/2016 0955   CREATININE 0.7 08/28/2016 0955      Component Value Date/Time   CALCIUM 9.4 08/28/2016 0955   ALKPHOS 78 08/28/2016 0955   AST 24 08/28/2016 0955   ALT 28 08/28/2016 0955   BILITOT 0.66 08/28/2016 0955       Lab Results  Component Value Date   WBC 5.0 08/28/2016   HGB 13.0 08/28/2016   HCT 38.8 08/28/2016   MCV 84.7 08/28/2016   PLT 175 08/28/2016   NEUTROABS 3.2 08/28/2016     ASSESSMENT & PLAN:  Bilateral breast cancer ([facility]) Treatment Summary: 1. Neoadjuvant chemotherapy with either AC followed by THP started 09/13/2015 and completed 12 weeks of Taxol 11/29/2015, Herceptin Perjeta 6; now on Herceptin  2. Bilateral mastectomies and lymph node surgery 02/11/16 3. Adjuvant radiation 04/07/2016 to 05/15/2016 4. Adjuvant antiestrogen therapy with Letrozole started 06/26/2016 5. 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  Current treatment:  1. Adjuvant antiestrogen therapy with letrozole 2.5 mg daily started 06/26/2016 2. Herceptin maintenanceall oh the screw of  to be completed July 2018 Plan to initiate Neratinib after  Herceptin is complete Return to clinic 6 weeks for follow-up with me   I spent 25 minutes talking to the patient of which more than half was spent in counseling and coordination of care.  No orders of the defined types were placed in this encounter.  The patient has a good understanding of the overall plan. she agrees with it. she will call with any problems that may develop before the next visit here.   Rulon Eisenmenger, MD 10/09/16

## 2016-10-09 NOTE — Patient Instructions (Signed)
[facility] Discharge Instructions for Patients Receiving Chemotherapy  Today you received the following chemotherapy agents:  Herceptin (trastuzumab)  To help prevent nausea and vomiting after your treatment, we encourage you to take your nausea medication as prescribed.   If you develop nausea and vomiting that is not controlled by your nausea medication, call the clinic.   BELOW ARE SYMPTOMS THAT SHOULD BE REPORTED IMMEDIATELY:  *FEVER GREATER THAN 100.5 F  *CHILLS WITH OR WITHOUT FEVER  NAUSEA AND VOMITING THAT IS NOT CONTROLLED WITH YOUR NAUSEA MEDICATION  *UNUSUAL SHORTNESS OF BREATH  *UNUSUAL BRUISING OR BLEEDING  TENDERNESS IN MOUTH AND THROAT WITH OR WITHOUT PRESENCE OF ULCERS  *URINARY PROBLEMS  *BOWEL PROBLEMS  UNUSUAL RASH Items with * indicate a potential emergency and should be followed up as soon as possible.  Feel free to call the clinic you have any questions or concerns. The clinic phone number is [phone].  Please show the CHEMO ALERT CARD at check-in to the Emergency Department and triage nurse.   

## 2016-10-21 ENCOUNTER — Encounter: Payer: Self-pay | Admitting: Family Medicine

## 2016-10-21 ENCOUNTER — Ambulatory Visit: Payer: Self-pay | Attending: Family Medicine | Admitting: Family Medicine

## 2016-10-21 VITALS — BP 177/95 | HR 68 | Temp 98.0°F | Wt 224.2 lb

## 2016-10-21 DIAGNOSIS — C50812 Malignant neoplasm of overlapping sites of left female breast: Secondary | ICD-10-CM | POA: Insufficient documentation

## 2016-10-21 DIAGNOSIS — E876 Hypokalemia: Secondary | ICD-10-CM

## 2016-10-21 DIAGNOSIS — Z923 Personal history of irradiation: Secondary | ICD-10-CM | POA: Insufficient documentation

## 2016-10-21 DIAGNOSIS — C50811 Malignant neoplasm of overlapping sites of right female breast: Secondary | ICD-10-CM

## 2016-10-21 DIAGNOSIS — R05 Cough: Secondary | ICD-10-CM | POA: Insufficient documentation

## 2016-10-21 DIAGNOSIS — Z9013 Acquired absence of bilateral breasts and nipples: Secondary | ICD-10-CM | POA: Insufficient documentation

## 2016-10-21 DIAGNOSIS — I10 Essential (primary) hypertension: Secondary | ICD-10-CM

## 2016-10-21 DIAGNOSIS — R059 Cough, unspecified: Secondary | ICD-10-CM

## 2016-10-21 MED ORDER — CETIRIZINE HCL 10 MG PO TABS: 10.0000 mg | ORAL_TABLET | Freq: Every day | ORAL | 2 refills | Status: DC

## 2016-10-21 MED ORDER — AMLODIPINE BESYLATE 5 MG PO TABS: 5.0000 mg | ORAL_TABLET | Freq: Every day | ORAL | 5 refills | Status: DC

## 2016-10-21 NOTE — Patient Instructions (Signed)

## 2016-10-21 NOTE — Progress Notes (Signed)
Subjective:  Patient ID: Felicia [name], female    DOB: [birth year]  Age: 55 y.o. MRN: [id number]  CC: Hypertension   HPI Felicia [name] is 57 year old female with a history of GERD, bilateral breast cancers diagnosed in 07/2015 (status post bilateral mastectomy and radiation), status post bilateral lymph node excision currently undergoing adjuvant antiestrogen therapy and Herceptin maintenance who comes into the clinic to follow up on Hypertension.  She has been compliant with 10 mg of amlodipine but complains of dizziness which is unrelated to position and she describes this as "her head about to fall off'. She states with 5 mg of amlodipine which she received from cardiology she felt better and had no dizziness. Denies vertigo, tinnitus.  She also complains of a dry throat and a sensation of something stuck in her throat. Denies sinus tenderness or shortness of breath and has no wheezing.  Past Medical History:  Diagnosis Date  . Cancer ([place])    breast  . GERD (gastroesophageal reflux disease)     Past Surgical History:  Procedure Laterality Date  . MASTECTOMY MODIFIED RADICAL Bilateral 02/11/2016  . MASTECTOMY MODIFIED RADICAL Bilateral 02/11/2016   Procedure: BILATERAL MASTECTOMY MODIFIED RADICAL;  Surgeon: Rolm Bookbinder, MD;  Location: [place];  Service: General;  Laterality: Bilateral;  . PORTACATH PLACEMENT N/A 09/04/2015   Procedure: INSERTION PORT-A-CATH WITH [place];  Surgeon: Rolm Bookbinder, MD;  Location: [place];  Service: General;  Laterality: N/A;  . TUBAL LIGATION      Allergies  Allergen Reactions  . No Known Allergies       Outpatient Medications Prior to Visit  Medication Sig Dispense Refill  . letrozole (FEMARA) 2.5 MG tablet Take 1 tablet (2.5 mg total) by mouth daily. 90 tablet 3  . lidocaine-prilocaine (EMLA) cream Apply 1 application topically as needed (Apply 2- hours prior to access of port). 30 g 1  . potassium chloride  (K-DUR,KLOR-CON) 10 MEQ tablet Take 1 tablet (10 mEq total) by mouth 2 (two) times daily. 30 tablet 3  . amLODipine (NORVASC) 10 MG tablet Take 1 tablet (10 mg total) by mouth daily. 30 tablet 3  . [START ON 11/10/2016] Neratinib Maleate (NERLYNX) 40 MG tablet Take 6 tablets (240 mg total) by mouth daily. Take with food. (Patient not taking: Reported on 10/21/2016) 180 tablet 3   No facility-administered medications prior to visit.     ROS Review of Systems  Constitutional: Negative for activity change, appetite change and fatigue.  HENT: Negative for congestion, sinus pressure and sore throat.   Eyes: Negative for visual disturbance.  Respiratory: Positive for cough. Negative for chest tightness, shortness of breath and wheezing.   Cardiovascular: Negative for chest pain and palpitations.  Gastrointestinal: Negative for abdominal distention, abdominal pain and constipation.  Endocrine: Negative for polydipsia.  Genitourinary: Negative for dysuria and frequency.  Musculoskeletal: Negative for arthralgias and back pain.  Skin: Negative for rash.  Neurological: Positive for light-headedness. Negative for tremors and numbness.  Hematological: Does not bruise/bleed easily.  Psychiatric/Behavioral: Negative for agitation and behavioral problems.    Objective:  BP (!) 177/95   Pulse 68   Temp 98 F (36.7 C) (Oral)   Wt 224 lb 3.2 oz (101.7 kg)   SpO2 98%   BMI 41.01 kg/m   BP/Weight 10/21/2016 10/09/2016 7/65/4650  Systolic BP 354 656 812  Diastolic BP 95 74 69  Wt. (Lbs) 224.2 224.5 -  BMI 41.01 41.06 -      Physical Exam  Constitutional:  She is oriented to person, place, and time. She appears well-developed and well-nourished.  HENT:  Right Ear: External ear normal.  Left Ear: External ear normal.  Mouth/Throat: Oropharynx is clear and moist.  Cardiovascular: Normal rate, normal heart sounds and intact distal pulses.   No murmur heard. Pulmonary/Chest: Effort normal and  breath sounds normal. She has no wheezes. She has no rales. She exhibits no tenderness.  Abdominal: Soft. Bowel sounds are normal. She exhibits no distension and no mass. There is no tenderness.  Musculoskeletal: Normal range of motion.  Neurological: She is alert and oriented to person, place, and time.  Skin: Skin is warm and dry.  Psychiatric: She has a normal mood and affect.      CMP Latest Ref Rng & Units 10/09/2016 08/28/2016 06/26/2016  Glucose 70 - 140 mg/dl 107 147(H) 84  BUN 7.0 - 26.0 mg/dL 12.5 15.5 11.2  Creatinine 0.6 - 1.1 mg/dL 0.7 0.7 0.7  Sodium 136 - 145 mEq/L 142 141 140  Potassium 3.5 - 5.1 mEq/L 3.6 3.2(L) 3.9  Chloride 101 - 111 mmol/L - - -  CO2 22 - 29 mEq/L 26 26 26   Calcium 8.4 - 10.4 mg/dL 9.8 9.4 9.7  Total Protein 6.4 - 8.3 g/dL 7.3 7.0 7.2  Total Bilirubin 0.20 - 1.20 mg/dL 0.83 0.66 0.72  Alkaline Phos 40 - 150 U/L 76 78 91  AST 5 - 34 U/L 29 24 26   ALT 0 - 55 U/L 33 28 27    Assessment & Plan:   1. Essential hypertension Blood pressure elevated - leg blood pressure tends to be 10-20% higher than brachial blood pressure Reduced dose of amlodipine from 10 mg to 5 mg due to dizziness Low-sodium diet - amLODipine (NORVASC) 5 MG tablet; Take 1 tablet (5 mg total) by mouth daily.  Dispense: 30 tablet; Refill: 5  2. Cough - cetirizine (ZYRTEC) 10 MG tablet; Take 1 tablet (10 mg total) by mouth daily.  Dispense: 30 tablet; Refill: 2  3. Hypokalemia Last potassium was 3.6  4. Bilateral malignant neoplasm of overlapping sites of breast in female, unspecified estrogen receptor status ([place]) Status post bilateral mastectomy, lymph node excision Completed radiation Completed chemotherapy with Adriamycin and Cytoxan followed by Taxol/Herceptin/perjeta Continue Herceptin therapy and Letrozole Keep appointment with the [facility] ordered this encounter  Medications  . amLODipine (NORVASC) 5 MG tablet    Sig: Take 1 tablet (5 mg total) by mouth  daily.    Dispense:  30 tablet    Refill:  5    Discontinue previous dose  . cetirizine (ZYRTEC) 10 MG tablet    Sig: Take 1 tablet (10 mg total) by mouth daily.    Dispense:  30 tablet    Refill:  2    Follow-up: Return in about 3 months (around 01/21/2017) for Follow-up on chronic medical conditions.   This note has been created with Surveyor, quantity. Any transcriptional errors are unintentional.     Arnoldo Morale MD

## 2016-10-28 ENCOUNTER — Other Ambulatory Visit: Payer: Self-pay

## 2016-10-28 DIAGNOSIS — Z170 Estrogen receptor positive status [ER+]: Principal | ICD-10-CM

## 2016-10-28 DIAGNOSIS — C50011 Malignant neoplasm of nipple and areola, right female breast: Secondary | ICD-10-CM

## 2016-10-28 DIAGNOSIS — C50012 Malignant neoplasm of nipple and areola, left female breast: Principal | ICD-10-CM

## 2016-10-30 ENCOUNTER — Ambulatory Visit: Payer: Self-pay

## 2016-10-30 ENCOUNTER — Ambulatory Visit (HOSPITAL_BASED_OUTPATIENT_CLINIC_OR_DEPARTMENT_OTHER): Payer: Self-pay

## 2016-10-30 ENCOUNTER — Other Ambulatory Visit: Payer: Self-pay

## 2016-10-30 ENCOUNTER — Other Ambulatory Visit (HOSPITAL_BASED_OUTPATIENT_CLINIC_OR_DEPARTMENT_OTHER): Payer: Self-pay

## 2016-10-30 ENCOUNTER — Encounter: Payer: Self-pay | Admitting: *Deleted

## 2016-10-30 ENCOUNTER — Telehealth: Payer: Self-pay | Admitting: Pharmacist

## 2016-10-30 VITALS — BP 181/78 | HR 63 | Temp 98.0°F | Resp 20

## 2016-10-30 DIAGNOSIS — C50211 Malignant neoplasm of upper-inner quadrant of right female breast: Secondary | ICD-10-CM

## 2016-10-30 DIAGNOSIS — C50011 Malignant neoplasm of nipple and areola, right female breast: Secondary | ICD-10-CM

## 2016-10-30 DIAGNOSIS — Z5112 Encounter for antineoplastic immunotherapy: Secondary | ICD-10-CM

## 2016-10-30 DIAGNOSIS — C773 Secondary and unspecified malignant neoplasm of axilla and upper limb lymph nodes: Secondary | ICD-10-CM

## 2016-10-30 DIAGNOSIS — E876 Hypokalemia: Secondary | ICD-10-CM

## 2016-10-30 DIAGNOSIS — C50811 Malignant neoplasm of overlapping sites of right female breast: Secondary | ICD-10-CM

## 2016-10-30 DIAGNOSIS — C50012 Malignant neoplasm of nipple and areola, left female breast: Principal | ICD-10-CM

## 2016-10-30 DIAGNOSIS — Z171 Estrogen receptor negative status [ER-]: Principal | ICD-10-CM

## 2016-10-30 DIAGNOSIS — Z95828 Presence of other vascular implants and grafts: Secondary | ICD-10-CM

## 2016-10-30 DIAGNOSIS — Z170 Estrogen receptor positive status [ER+]: Principal | ICD-10-CM

## 2016-10-30 DIAGNOSIS — C50812 Malignant neoplasm of overlapping sites of left female breast: Secondary | ICD-10-CM

## 2016-10-30 DIAGNOSIS — C50311 Malignant neoplasm of lower-inner quadrant of right female breast: Secondary | ICD-10-CM

## 2016-10-30 LAB — CBC WITH DIFFERENTIAL/PLATELET
BASO%: 0.6 % (ref 0.0–2.0)
BASOS ABS: 0 10*3/uL (ref 0.0–0.1)
EOS ABS: 0.1 10*3/uL (ref 0.0–0.5)
EOS%: 1.3 % (ref 0.0–7.0)
HCT: 40.1 % (ref 34.8–46.6)
HGB: 13.4 g/dL (ref 11.6–15.9)
LYMPH%: 28.3 % (ref 14.0–49.7)
MCH: 28.5 pg (ref 25.1–34.0)
MCHC: 33.4 g/dL (ref 31.5–36.0)
MCV: 85.3 fL (ref 79.5–101.0)
MONO#: 0.4 10*3/uL (ref 0.1–0.9)
MONO%: 7.8 % (ref 0.0–14.0)
NEUT#: 3.4 10*3/uL (ref 1.5–6.5)
NEUT%: 62 % (ref 38.4–76.8)
PLATELETS: 186 10*3/uL (ref 145–400)
RBC: 4.71 10*6/uL (ref 3.70–5.45)
RDW: 13.3 % (ref 11.2–14.5)
WBC: 5.5 10*3/uL (ref 3.9–10.3)
lymph#: 1.6 10*3/uL (ref 0.9–3.3)

## 2016-10-30 LAB — COMPREHENSIVE METABOLIC PANEL
ALK PHOS: 83 U/L (ref 40–150)
ALT: 32 U/L (ref 0–55)
ANION GAP: 7 meq/L (ref 3–11)
AST: 27 U/L (ref 5–34)
Albumin: 3.8 g/dL (ref 3.5–5.0)
BILIRUBIN TOTAL: 0.71 mg/dL (ref 0.20–1.20)
BUN: 13.2 mg/dL (ref 7.0–26.0)
CO2: 25 mEq/L (ref 22–29)
Calcium: 9.5 mg/dL (ref 8.4–10.4)
Chloride: 106 mEq/L (ref 98–109)
Creatinine: 0.6 mg/dL (ref 0.6–1.1)
GLUCOSE: 116 mg/dL (ref 70–140)
POTASSIUM: 3.3 meq/L — AB (ref 3.5–5.1)
SODIUM: 138 meq/L (ref 136–145)
Total Protein: 7.2 g/dL (ref 6.4–8.3)

## 2016-10-30 MED ORDER — SODIUM CHLORIDE 0.9% FLUSH
10.0000 mL | INTRAVENOUS | Status: DC | PRN
  Administered 2016-10-30: 10 mL
  Filled 2016-10-30: qty 10

## 2016-10-30 MED ORDER — DIPHENHYDRAMINE HCL 25 MG PO CAPS
50.0000 mg | ORAL_CAPSULE | Freq: Once | ORAL | Status: AC
  Administered 2016-10-30: 50 mg via ORAL

## 2016-10-30 MED ORDER — TRASTUZUMAB CHEMO 150 MG IV SOLR
600.0000 mg | Freq: Once | INTRAVENOUS | Status: AC
  Administered 2016-10-30: 600 mg via INTRAVENOUS
  Filled 2016-10-30: qty 28.57

## 2016-10-30 MED ORDER — HEPARIN SOD (PORK) LOCK FLUSH 100 UNIT/ML IV SOLN
500.0000 [IU] | Freq: Once | INTRAVENOUS | Status: AC | PRN
Start: 2016-10-30 — End: 2016-10-30
  Administered 2016-10-30: 500 [IU]
  Filled 2016-10-30: qty 5

## 2016-10-30 MED ORDER — SODIUM CHLORIDE 0.9 % IV SOLN
Freq: Once | INTRAVENOUS | Status: AC
  Administered 2016-10-30: 12:00:00 via INTRAVENOUS

## 2016-10-30 MED ORDER — ACETAMINOPHEN 325 MG PO TABS
650.0000 mg | ORAL_TABLET | Freq: Once | ORAL | Status: AC
  Administered 2016-10-30: 650 mg via ORAL

## 2016-10-30 MED ORDER — DIPHENHYDRAMINE HCL 25 MG PO CAPS
ORAL_CAPSULE | ORAL | Status: AC
  Filled 2016-10-30: qty 2

## 2016-10-30 MED ORDER — ACETAMINOPHEN 325 MG PO TABS
ORAL_TABLET | ORAL | Status: AC
  Filled 2016-10-30: qty 2

## 2016-10-30 MED ORDER — SODIUM CHLORIDE 0.9% FLUSH
10.0000 mL | INTRAVENOUS | Status: DC | PRN
  Administered 2016-10-30: 10 mL via INTRAVENOUS
  Filled 2016-10-30: qty 10

## 2016-10-30 NOTE — Patient Instructions (Signed)
[facility] Discharge Instructions for Patients Receiving Chemotherapy  Today you received the following chemotherapy agents:  Herceptin (trastuzumab)  To help prevent nausea and vomiting after your treatment, we encourage you to take your nausea medication as prescribed.   If you develop nausea and vomiting that is not controlled by your nausea medication, call the clinic.   BELOW ARE SYMPTOMS THAT SHOULD BE REPORTED IMMEDIATELY:  *FEVER GREATER THAN 100.5 F  *CHILLS WITH OR WITHOUT FEVER  NAUSEA AND VOMITING THAT IS NOT CONTROLLED WITH YOUR NAUSEA MEDICATION  *UNUSUAL SHORTNESS OF BREATH  *UNUSUAL BRUISING OR BLEEDING  TENDERNESS IN MOUTH AND THROAT WITH OR WITHOUT PRESENCE OF ULCERS  *URINARY PROBLEMS  *BOWEL PROBLEMS  UNUSUAL RASH Items with * indicate a potential emergency and should be followed up as soon as possible.  Feel free to call the clinic you have any questions or concerns. The clinic phone number is [phone].  Please show the CHEMO ALERT CARD at check-in to the Emergency Department and triage nurse.   

## 2016-10-30 NOTE — Patient Instructions (Signed)

## 2016-10-30 NOTE — Telephone Encounter (Signed)
Oral Chemotherapy Pharmacist Encounter  Notified by MD that he plans to start patient on Nerlynx therapy after completion of 1 year of adjuvant Herceptin for the maintenance treatment of hormone-receptor positive, Her-2 positive breast cancer.  10/30/16 labs reviewed, OK for treatment  Current medication list in Epic reviewed, DDI between Nerlynx and cetirizine identified: Category C interaction, cetirizine may decrease clearance of Nerlynx through inhibition of P-gp transporter enzyme. No change to therapy indicated at this time. Patient will be monitored for Nerlynx side effects.  Prescription has been e-scribed to the [facility], however, patient does not have prescription medication coverage and the price of the medication is prohibitively expensive. I met patient, daughter, and Spanish interpreter with Gilmore Laroche, CPhT, to go over application for assistance to try to obtain the medication at 40 out of pocket cost from the manufacturer ([employer]). Patient signature obtained. Daughter will provide income documentation to Lavon Paganini, [place] interpreter, who will deliver to Oral Oncology Clinic.   Once this documentation is received, completed assistance application will be faxed to [place].  Oral Oncology Clinic will continue to follow.  Johny Drilling, PharmD, BCPS, BCOP 10/30/2016  12:48 PM Oral Oncology Clinic [phone]

## 2016-11-06 ENCOUNTER — Telehealth: Payer: Self-pay | Admitting: Pharmacy Technician

## 2016-11-06 NOTE — Telephone Encounter (Signed)
Oral Oncology Patient Advocate Encounter  Income documentation was received and application has been submitted to [facility].    Application was faxed to [phone].   Phone number for follow up is [phone]  This encounter will be updated with final determination.   Oral oncology clinic will continue to follow.   Fabio Asa. Melynda Keller, [place] Patient [place] [phone] 11/06/2016 11:44 AM

## 2016-11-07 NOTE — Telephone Encounter (Signed)
Oral Oncology Patient Advocate Encounter  Received notification from [facility] Patient Assistance program that patient has been successfully enrolled into their program to receive Nerlynx from the manufacturer at $0 out of pocket until 11/07/2017.   I called and left a message for the patient's daughter with the good news.  Should issues arise the number to [facility] for follow up is [phone].   I advised her to call the office with any questions or concerns.  Oral Oncology Clinic will continue to follow.  Gilmore Laroche, CPhT, [place] Oral Oncology Patient Advocate [phone] 11/07/2016 3:41 PM

## 2016-11-10 ENCOUNTER — Telehealth: Payer: Self-pay | Admitting: Pharmacist

## 2016-11-10 NOTE — Telephone Encounter (Signed)
Oral Chemotherapy Pharmacist Encounter  I called and LVM for patient's daughter, Olin Hauser, with request for them to obtain the Nerlynx from [facility] when they call to coordinate treatment, but do not have patient start Nerlynx until she is seen back in the office on 7/26. Spanish interpreter, Lavon Paganini, will speak to patient with me for initial counseling for Nerlynx at that office visit.  Oral Oncology Clinic number left for [facility] for any questions or concerns.  Oral Oncology Clinic will continue to follow.  Johny Drilling, PharmD, BCPS, BCOP 11/10/2016  9:47 AM Oral Oncology Clinic [phone]

## 2016-11-20 ENCOUNTER — Encounter: Payer: Self-pay | Admitting: Hematology and Oncology

## 2016-11-20 ENCOUNTER — Ambulatory Visit (HOSPITAL_BASED_OUTPATIENT_CLINIC_OR_DEPARTMENT_OTHER): Payer: Self-pay | Admitting: Hematology and Oncology

## 2016-11-20 VITALS — BP 187/79 | HR 67 | Temp 97.7°F | Resp 18 | Ht 62.0 in | Wt 224.1 lb

## 2016-11-20 DIAGNOSIS — C773 Secondary and unspecified malignant neoplasm of axilla and upper limb lymph nodes: Secondary | ICD-10-CM

## 2016-11-20 DIAGNOSIS — C50012 Malignant neoplasm of nipple and areola, left female breast: Secondary | ICD-10-CM

## 2016-11-20 DIAGNOSIS — C50811 Malignant neoplasm of overlapping sites of right female breast: Secondary | ICD-10-CM

## 2016-11-20 DIAGNOSIS — C50211 Malignant neoplasm of upper-inner quadrant of right female breast: Secondary | ICD-10-CM

## 2016-11-20 DIAGNOSIS — C50311 Malignant neoplasm of lower-inner quadrant of right female breast: Secondary | ICD-10-CM

## 2016-11-20 DIAGNOSIS — C50011 Malignant neoplasm of nipple and areola, right female breast: Secondary | ICD-10-CM

## 2016-11-20 DIAGNOSIS — G620 Drug-induced polyneuropathy: Secondary | ICD-10-CM

## 2016-11-20 DIAGNOSIS — C50812 Malignant neoplasm of overlapping sites of left female breast: Secondary | ICD-10-CM

## 2016-11-20 DIAGNOSIS — Z170 Estrogen receptor positive status [ER+]: Secondary | ICD-10-CM

## 2016-11-20 DIAGNOSIS — Z79811 Long term (current) use of aromatase inhibitors: Secondary | ICD-10-CM

## 2016-11-20 NOTE — Telephone Encounter (Signed)
Oral Chemotherapy Pharmacist Encounter   I spoke with patient and daughter, Felicia [name], for overview of new oral chemotherapy medication: Nerlynx for the maintenance adjuvant treatment of of hormone-receptor positive, Her-2 positive breast cancer after completion of 1 year of adjuvant Herceptin , planned duration 1 year.   Pt is doing well. The prescription is being filled through [facility] patient assistance foundation compassionate use. Patient has already received her 1st shipment of a 30 day supply.   Counseled patient on administration, dosing, side effects, safe handling, and monitoring. Patient will take Nerlynx '40mg'$  tablets, 6 tablets ('240mg'$ ) by mouth once daily with food. Patient knows to avoid stomach acid suppressants and grapefruit. Patient will start anti-diarrheal prophylaxis with loperamide '2mg'$  tablet, 1 tablet by mouth twice daily to start with Nerlynx start. She will take additional loperamide as directed with breakthrough symptoms. They will call the office if patient experiences any diarrhea. Continuation of anti-diarrheal will be assessed at next office visit.  Side effects include but not limited to: N/V, diarrhea, fatigue, and rash.    Reviewed with patient importance of keeping a medication schedule and plan for any missed doses.  Spanish patient education information and direct line to Oral Oncology Clinic provided to patient and daughter.  Ms. Elveria Royals voiced understanding and appreciation.   All questions answered.  Patient knows to call the office with questions or concerns. Oral Oncology Clinic will continue to follow.  Thank you,  Johny Drilling, PharmD, BCPS, BCOP 11/20/2016  3:37 PM Oral Oncology Clinic [phone]

## 2016-11-20 NOTE — Assessment & Plan Note (Signed)
Treatment Summary: 1. Neoadjuvant chemotherapy with either AC followed by THP started 09/13/2015 and completed 12 weeks of Taxol 11/29/2015, Herceptin Perjeta 6; now on Herceptin  2. Bilateral mastectomies and lymph node surgery 02/11/16 3. Adjuvant radiation 04/07/2016 to 05/15/2016 4. Adjuvant antiestrogen therapy with Letrozole started 06/26/2016 5. 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 Herceptin maintenance completed July 2018  Current treatment:  1. Adjuvant antiestrogen therapy with letrozole 2.5 mg daily started 06/26/2016 2. Neratinib 240 mg daily started 11/20/2016  Neratinib counseling: Neratinib is up and HER-2 inhibitor that is currently approved for treatment of HER-2 positive breast cancer after conclusion of one year of Herceptin treatment. Based on the studies it does appear to have a progression free survival difference of 2% absolute value. The starting dose of the spill is 200 mg once daily. The greatest side effect of this medication is diarrhea. Patient will need to be on prophylactic antidiarrheal medications. Days 1 to 14: Loperamide 4 mg orally 3 times daily Days 15 to 56: Loperamide 4 mg orally twice daily Days 57 to 365: Loperamide 4 mg as needed (maximum: 16 mg/day) During the treatment will have to monitor the liver functions. Apart from the diarrhea other side effects include fatigue 27%, skin rash 18%, muscle spasms in 11%.  Return to clinic 4 weeks for follow-up

## 2016-11-20 NOTE — Progress Notes (Signed)
Patient Care Team: Arnoldo Morale, MD as PCP - General (Family Medicine)  DIAGNOSIS:  Encounter Diagnoses  Name Primary?  . Bilateral malignant neoplasm of overlapping sites of breast in female, unspecified estrogen receptor status ([place]) Yes  . 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, estrogen receptor positive ([place])     SUMMARY OF ONCOLOGIC HISTORY:   Bilateral breast cancer ([place])   08/16/2015 Mammogram    Left breast LIQ 4.2 cm, 4 mm oval mass 12:00, 1.2 cm mass left UOQ, enlarged left axillary lymph node      08/16/2015 Mammogram    Right breast UIQ: 3.9 cm irregular mass, 1.5 cm mass in the right LOQ, 9 mm mass right UOQ, 6 mm mass right LIQ, enlarged lymph node Rt axilla      08/20/2015 Initial Diagnosis    Right breast biopsy 1:00: IDC grade 2 ER 95%, PR 2%, Ki 67 15%; HER-2 negative ratio 1.04; 10:00: IDC grade 2-3 dissimilar to 1:00 biopsy 10:00: ER 95%, PR 0%, K67: 25%, HER-2 positive ratio 6.42 right axillary lymph node positive for metastatic cancer      08/20/2015 Procedure    Left biopsy 8:00: IDC with DCIS, grade 2; ER 95%, PR 95%, Ki-67 30%, HER-2 negative ratio 1.2; 12:00: IDC with DCIS, grade 1 ER 95%, PR 95%, Ki-67 10% HER-2 negative ratio 1.14 not similar to this biopsy, left axillary lymph node positive,       09/13/2015 - 01/17/2016 Neo-Adjuvant Chemotherapy    Neoadjuvant dose dense Adriamycin and Cytoxan followed by Taxol Herceptin Perjeta, followed by Herceptin maintenance      01/21/2016 Breast MRI    Bilateral breast masses significantly decreased in size with mild residual NME, complete resolution of numerous other nodules in both breasts, bilateral axillary lymph nodes and bilateral retropectoral lymph nodes decreased in size      02/11/2016 Surgery    Right mastectomy: IDC 2 nodules, 2.5, 1 cm, margins negative, 3/7 lymph nodes positive T2 N1a stage II B, ER 95%, PR 2%, HER-2 negative, Ki-67 15% RCB burden 3.1 and 2.9 class II      02/11/2016 Surgery    Left mastectomy: IDC 3.1 cm with DCIS, margins negative, 0/8 lymph nodes, T2 N0 stage II a, RCB burden 2.18, class II, ER 100%, PR 95%, HER-2 negative ratio 1.2, Ki-67 30%       04/07/2016 - 05/15/2016 Radiation Therapy    Adjuvant radiation      06/26/2016 -  Anti-estrogen oral therapy    Letrozole 2.5 mg daily      11/20/2016 Miscellaneous    Neratinib 240 mg daily       CHIEF COMPLIANT: Patient is here to start Neratinib therapy  INTERVAL HISTORY: Burlene Arnt is a 56 year old with above-mentioned history of bilateral mastectomies who underwent neoadjuvant chemotherapy. She finished adjuvant radiation and is currently on letrozole. We're here to begin her treatment with Neratinib. She met with our pharmacist and had extensive counseling regarding the drug. She complains of tenderness in the left chest wall and along the site of surgical incision and radiation. She also feels slight lumpiness around that area.  REVIEW OF SYSTEMS:   Constitutional: Denies fevers, chills or abnormal weight loss Eyes: Denies blurriness of vision Ears, nose, mouth, throat, and face: Denies mucositis or sore throat Respiratory: Denies cough, dyspnea or wheezes Cardiovascular: Denies palpitation, chest discomfort Gastrointestinal:  Denies nausea, heartburn or change in bowel habits Skin: Denies abnormal skin rashes Lymphatics: Denies new lymphadenopathy or easy  bruising Neurological:Denies numbness, tingling or new weaknesses Behavioral/Psych: Mood is stable, no new changes  Extremities: No lower extremity edema Breast: Bilateral mastectomies status post radiation complaining of tenderness along the chest wall All other systems were reviewed with the patient and are negative.  I have reviewed the past medical history, past surgical history, social history and family history with the patient and they are unchanged from previous note.  ALLERGIES:  is allergic to no known  allergies.  MEDICATIONS:  Current Outpatient Prescriptions  Medication Sig Dispense Refill  . amLODipine (NORVASC) 5 MG tablet Take 1 tablet (5 mg total) by mouth daily. 30 tablet 5  . cetirizine (ZYRTEC) 10 MG tablet Take 1 tablet (10 mg total) by mouth daily. 30 tablet 2  . letrozole (FEMARA) 2.5 MG tablet Take 1 tablet (2.5 mg total) by mouth daily. 90 tablet 3  . lidocaine-prilocaine (EMLA) cream Apply 1 application topically as needed (Apply 2- hours prior to access of port). 30 g 1  . Neratinib Maleate (NERLYNX) 40 MG tablet Take 6 tablets (240 mg total) by mouth daily. Take with food. (Patient not taking: Reported on 10/21/2016) 180 tablet 3  . potassium chloride (K-DUR,KLOR-CON) 10 MEQ tablet Take 1 tablet (10 mEq total) by mouth 2 (two) times daily. 30 tablet 3   No current facility-administered medications for this visit.     PHYSICAL EXAMINATION: ECOG PERFORMANCE STATUS: 1 - Symptomatic but completely ambulatory  Vitals:   11/20/16 1524  BP: (!) 187/79  Pulse: 67  Resp: 18  Temp: 97.7 F (36.5 C)   Filed Weights   11/20/16 1524  Weight: 224 lb 1.6 oz (101.7 kg)    GENERAL:alert, no distress and comfortable SKIN: skin color, texture, turgor are normal, no rashes or significant lesions EYES: normal, Conjunctiva are pink and non-injected, sclera clear OROPHARYNX:no exudate, no erythema and lips, buccal mucosa, and tongue normal  NECK: supple, thyroid normal size, non-tender, without nodularity LYMPH:  no palpable lymphadenopathy in the cervical, axillary or inguinal LUNGS: clear to auscultation and percussion with normal breathing effort HEART: regular rate & rhythm and no murmurs and no lower extremity edema ABDOMEN:abdomen soft, non-tender and normal bowel sounds MUSCULOSKELETAL:no cyanosis of digits and no clubbing  NEURO: alert & oriented x 3 with fluent speech, no focal motor/sensory deficits EXTREMITIES: No lower extremity edema BREAST:Bilateral mastectomies,  tenderness in the surgical incision. (exam performed in the presence of a chaperone)  LABORATORY DATA:  I have reviewed the data as listed   Chemistry      Component Value Date/Time   NA 138 10/30/2016 1053   K 3.3 (L) 10/30/2016 1053   CL 107 02/12/2016 0606   CO2 25 10/30/2016 1053   BUN 13.2 10/30/2016 1053   CREATININE 0.6 10/30/2016 1053      Component Value Date/Time   CALCIUM 9.5 10/30/2016 1053   ALKPHOS 83 10/30/2016 1053   AST 27 10/30/2016 1053   ALT 32 10/30/2016 1053   BILITOT 0.71 10/30/2016 1053       Lab Results  Component Value Date   WBC 5.5 10/30/2016   HGB 13.4 10/30/2016   HCT 40.1 10/30/2016   MCV 85.3 10/30/2016   PLT 186 10/30/2016   NEUTROABS 3.4 10/30/2016    ASSESSMENT & PLAN:  Bilateral breast cancer ([place]) Treatment Summary: 1. Neoadjuvant chemotherapy with either AC followed by THP started 09/13/2015 and completed 12 weeks of Taxol 11/29/2015, Herceptin Perjeta 6; now on Herceptin  2. Bilateral mastectomies and lymph node surgery  02/11/16 3. Adjuvant radiation 04/07/2016 to 05/15/2016 4. Adjuvant antiestrogen therapy with Letrozole started 06/26/2016 5. 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 Herceptin maintenance completed July 2018  Current treatment:  1. Adjuvant antiestrogen therapy with letrozole 2.5 mg daily started 06/26/2016 2. Neratinib 240 mg daily started 11/20/2016  Neratinib counseling: Neratinib is up and HER-2 inhibitor that is currently approved for treatment of HER-2  positive breast cancer after conclusion of one year of Herceptin treatment. Based on the studies it does appear to have a progression free survival difference of 2% absolute value. The starting dose of the spill is 200 mg once daily. The greatest side effect of this medication is diarrhea. Patient will need to be on prophylactic antidiarrheal medications. Days 1 to 14: Loperamide 4 mg orally 3 times daily Days 15 to 56: Loperamide 4 mg orally twice daily Days 57 to 365: Loperamide 4 mg as needed (maximum: 16 mg/day) During the treatment will have to monitor the liver functions. Apart from the diarrhea other side effects include fatigue 27%, skin rash 18%, muscle spasms in 11%.  Return to clinic 4 weeks for follow-up    I spent 25 minutes talking to the patient of which more than half was spent in counseling and coordination of care.  Orders Placed This Encounter  Procedures  . CBC with Differential    Standing Status:   Future    Standing Expiration Date:   11/20/2017  . Comprehensive metabolic panel    Standing Status:   Future    Standing Expiration Date:   11/20/2017   The patient has a good understanding of the overall plan. she agrees with it. she will call with any problems that may develop before the next visit here.   Rulon Eisenmenger, MD 11/20/16

## 2016-11-24 ENCOUNTER — Emergency Department (HOSPITAL_COMMUNITY): Payer: Self-pay

## 2016-11-24 ENCOUNTER — Telehealth: Payer: Self-pay

## 2016-11-24 ENCOUNTER — Encounter (HOSPITAL_COMMUNITY): Payer: Self-pay | Admitting: Emergency Medicine

## 2016-11-24 ENCOUNTER — Emergency Department (HOSPITAL_COMMUNITY)
Admission: EM | Admit: 2016-11-24 | Discharge: 2016-11-24 | Disposition: A | Discharge status: Home / Self Care | Payer: Self-pay | Attending: Emergency Medicine | Admitting: Emergency Medicine

## 2016-11-24 DIAGNOSIS — I10 Essential (primary) hypertension: Secondary | ICD-10-CM | POA: Insufficient documentation

## 2016-11-24 DIAGNOSIS — R197 Diarrhea, unspecified: Secondary | ICD-10-CM | POA: Insufficient documentation

## 2016-11-24 DIAGNOSIS — Z79899 Other long term (current) drug therapy: Secondary | ICD-10-CM | POA: Insufficient documentation

## 2016-11-24 DIAGNOSIS — R1011 Right upper quadrant pain: Secondary | ICD-10-CM | POA: Insufficient documentation

## 2016-11-24 LAB — CBC WITH DIFFERENTIAL/PLATELET
Basophils Absolute: 0 10*3/uL (ref 0.0–0.1)
Basophils Relative: 0 %
Eosinophils Absolute: 0.2 10*3/uL (ref 0.0–0.7)
Eosinophils Relative: 2 %
HEMATOCRIT: 39.7 % (ref 36.0–46.0)
Hemoglobin: 13.8 g/dL (ref 12.0–15.0)
LYMPHS ABS: 2.2 10*3/uL (ref 0.7–4.0)
LYMPHS PCT: 26 %
MCH: 29 pg (ref 26.0–34.0)
MCHC: 34.8 g/dL (ref 30.0–36.0)
MCV: 83.4 fL (ref 78.0–100.0)
MONO ABS: 0.5 10*3/uL (ref 0.1–1.0)
MONOS PCT: 6 %
NEUTROS ABS: 5.6 10*3/uL (ref 1.7–7.7)
Neutrophils Relative %: 66 %
Platelets: 215 10*3/uL (ref 150–400)
RBC: 4.76 MIL/uL (ref 3.87–5.11)
RDW: 12.5 % (ref 11.5–15.5)
WBC: 8.4 10*3/uL (ref 4.0–10.5)

## 2016-11-24 LAB — COMPREHENSIVE METABOLIC PANEL
ALT: 48 U/L (ref 14–54)
AST: 42 U/L — AB (ref 15–41)
Albumin: 4.2 g/dL (ref 3.5–5.0)
Alkaline Phosphatase: 64 U/L (ref 38–126)
Anion gap: 9 (ref 5–15)
BILIRUBIN TOTAL: 0.9 mg/dL (ref 0.3–1.2)
BUN: 13 mg/dL (ref 6–20)
CO2: 23 mmol/L (ref 22–32)
Calcium: 9.7 mg/dL (ref 8.9–10.3)
Chloride: 105 mmol/L (ref 101–111)
Creatinine, Ser: 0.57 mg/dL (ref 0.44–1.00)
GFR calc Af Amer: >60 mL/min (ref >60)
Glucose, Bld: 122 mg/dL — ABNORMAL HIGH (ref 65–99)
POTASSIUM: 3.1 mmol/L — AB (ref 3.5–5.1)
Sodium: 137 mmol/L (ref 135–145)
TOTAL PROTEIN: 7.7 g/dL (ref 6.5–8.1)

## 2016-11-24 LAB — LIPASE, BLOOD: LIPASE: 24 U/L (ref 11–51)

## 2016-11-24 MED ORDER — HEPARIN SOD (PORK) LOCK FLUSH 100 UNIT/ML IV SOLN
500.0000 [IU] | Freq: Once | INTRAVENOUS | Status: AC
  Administered 2016-11-24: 500 [IU]
  Filled 2016-11-24: qty 5

## 2016-11-24 MED ORDER — LOPERAMIDE HCL 2 MG PO TABS: 2.0000 mg | ORAL_TABLET | Freq: Four times a day (QID) | ORAL | 0 refills | Status: DC | PRN

## 2016-11-24 MED ORDER — HYDROCODONE-ACETAMINOPHEN 5-325 MG PO TABS: 1.0000 | ORAL_TABLET | Freq: Four times a day (QID) | ORAL | 0 refills | Status: DC | PRN

## 2016-11-24 MED ORDER — ONDANSETRON 8 MG PO TBDP
8.0000 mg | ORAL_TABLET | Freq: Once | ORAL | Status: AC
  Administered 2016-11-24: 8 mg via ORAL
  Filled 2016-11-24: qty 1

## 2016-11-24 MED ORDER — HYDROMORPHONE HCL 1 MG/ML IJ SOLN
1.0000 mg | Freq: Once | INTRAMUSCULAR | Status: AC
  Administered 2016-11-24: 1 mg via INTRAVENOUS
  Filled 2016-11-24: qty 1

## 2016-11-24 MED ORDER — SODIUM CHLORIDE 0.9% FLUSH
10.0000 mL | Freq: Once | INTRAVENOUS | Status: AC
  Administered 2016-11-24: 10 mL via INTRAVENOUS

## 2016-11-24 MED ORDER — ONDANSETRON 4 MG PO TBDP: 4.0000 mg | ORAL_TABLET | Freq: Three times a day (TID) | ORAL | 0 refills | Status: DC | PRN

## 2016-11-24 NOTE — ED Provider Notes (Signed)
[facility] DEPT Provider Note   CSN: [id number] Arrival date & time: 11/24/16  0228     History   Chief Complaint Chief Complaint  Patient presents with  . Abdominal Pain    HPI Felicia [name] is a 56 y.o. female.  Patient presents emergency department with chief complaint of abdominal pain and diarrhea. She states that the symptoms started after beginning Nerylnx on 11/11/16.  She denies any fevers, chills, or vomiting. She states that the pain is mostly in her right upper quadrant. She denies shortness of breath, but does state that it feels like the pain takes her breath away. She states that the symptoms started earlier today, but acutely worsened at 10 PM tonight. She denies any other associated symptoms. There are no modifying factors.   The history is provided by the patient. No language interpreter was used.    Past Medical History:  Diagnosis Date  . Cancer ([place])    breast  . GERD (gastroesophageal reflux disease)     Patient Active Problem List   Diagnosis Date Noted  . Hypokalemia 09/03/2016  . Sinusitis 08/08/2016  . Hypertension 07/24/2016  . Port catheter in place 05/16/2016  . Chemotherapy-induced peripheral neuropathy ([place]) 01/24/2016  . Hypersensitivity reaction 11/05/2015  . Bilateral malignant neoplasm of overlapping sites of breast in female [facility]) 08/30/2015  . Bilateral breast cancer ([place]) 08/23/2015    Past Surgical History:  Procedure Laterality Date  . MASTECTOMY MODIFIED RADICAL Bilateral 02/11/2016  . MASTECTOMY MODIFIED RADICAL Bilateral 02/11/2016   Procedure: BILATERAL MASTECTOMY MODIFIED RADICAL;  Surgeon: Rolm Bookbinder, MD;  Location: [place];  Service: General;  Laterality: Bilateral;  . PORTACATH PLACEMENT N/A 09/04/2015   Procedure: INSERTION PORT-A-CATH WITH [place];  Surgeon: Rolm Bookbinder, MD;  Location: [place];  Service: General;  Laterality: N/A;  . TUBAL LIGATION      OB History    Gravida Para Term Preterm AB Living   3 3 3     3    SAB TAB Ectopic Multiple Live Births                   Home Medications    Prior to Admission medications   Medication Sig Start Date End Date Taking? Authorizing Provider  amLODipine (NORVASC) 5 MG tablet Take 1 tablet (5 mg total) by mouth daily. 10/21/16  Yes Arnoldo Morale, MD  Calcium Carb-Cholecalciferol (CALCIUM 600+D) 600-800 MG-UNIT TABS Take 1 tablet by mouth daily.   Yes [provider]  Cholecalciferol (VITAMIN D-3) 5000 units TABS Take 1 tablet by mouth daily.   Yes [provider]  letrozole (FEMARA) 2.5 MG tablet Take 1 tablet (2.5 mg total) by mouth daily. 06/26/16  Yes Nicholas Lose, MD  loperamide (IMODIUM A-D) 2 MG tablet Take 2 mg by mouth 4 (four) times daily as needed for diarrhea or loose stools.   Yes [provider]  Neratinib Maleate (NERLYNX) 40 MG tablet Take 6 tablets (240 mg total) by mouth daily. Take with food. 11/10/16  Yes Nicholas Lose, MD  potassium chloride (K-DUR,KLOR-CON) 10 MEQ tablet Take 1 tablet (10 mEq total) by mouth 2 (two) times daily. 09/03/16  Yes Arnoldo Morale, MD  cetirizine (ZYRTEC) 10 MG tablet Take 1 tablet (10 mg total) by mouth daily. 10/21/16   Arnoldo Morale, MD  lidocaine-prilocaine (EMLA) cream Apply 1 application topically as needed (Apply 2- hours prior to access of port). 04/24/16   Nicholas Lose, MD    Family History Family  History  Problem Relation Age of Onset  . Diabetes Mother     Social History Social History  Substance Use Topics  . Smoking status: Never Smoker  . Smokeless tobacco: Never Used  . Alcohol use No     Allergies   No known allergies   Review of Systems Review of Systems  All other systems reviewed and are negative.    Physical Exam Updated Vital Signs BP (!) 185/97 (BP Location: Left Leg)   Pulse 81   Temp 97.7 F (36.5 C) (Oral)   Resp 18   Ht 5' (1.524 m)   Wt 101.6 kg (224 lb)   SpO2 99%   BMI 43.75 kg/m     Physical Exam  Constitutional: She is oriented to person, place, and time. She appears well-developed and well-nourished.  HENT:  Head: Normocephalic and atraumatic.  Eyes: Pupils are equal, round, and reactive to light. Conjunctivae and EOM are normal.  Neck: Normal range of motion. Neck supple.  Cardiovascular: Normal rate and regular rhythm.  Exam reveals no gallop and no friction rub.   No murmur heard. Pulmonary/Chest: Effort normal and breath sounds normal. No respiratory distress. She has no wheezes. She has no rales. She exhibits no tenderness.  Abdominal: Soft. Bowel sounds are normal. She exhibits no distension and no mass. There is tenderness. There is no rebound and no guarding.  RUQ TTP  Musculoskeletal: Normal range of motion. She exhibits no edema or tenderness.  Neurological: She is alert and oriented to person, place, and time.  Skin: Skin is warm and dry.  Psychiatric: She has a normal mood and affect. Her behavior is normal. Judgment and thought content normal.  Nursing note and vitals reviewed.    ED Treatments / Results  Labs (all labs ordered are listed, but only abnormal results are displayed) Labs Reviewed  COMPREHENSIVE METABOLIC PANEL - Abnormal; Notable for the following:       Result Value   Potassium 3.1 (*)    Glucose, Bld 122 (*)    AST 42 (*)    All other components within normal limits  CBC WITH DIFFERENTIAL/PLATELET  LIPASE, BLOOD    EKG  EKG Interpretation None       Radiology No results found.  Procedures Procedures (including critical care time)  Medications Ordered in ED Medications  HYDROmorphone (DILAUDID) injection 1 mg (not administered)     Initial Impression / Assessment and Plan / ED Course  I have reviewed the triage vital signs and the nursing notes.  Pertinent labs & imaging results that were available during my care of the patient were reviewed by me and considered in my medical decision making (see chart for  details).     Patient with right upper quadrant abdominal pain that started today. Afebrile. No vomiting. Patient also reports diarrhea. She attributes her symptoms to having started Nerylnx, which has a high association with these symptoms at this timeframe.  However, will also check labs and RUQ [place].  Pneumonia thought to be less likely, this patient does not have cough or fever. She is low risk for PE, well's PE criteria is 1. She is not hypoxic nor tachycardic.  Patient's pain is improved. Ultrasound of right upper quadrant shows cholelithiasis, but no evidence of cholecystitis. Laboratory workup is reassuring. Patient's vital signs are stable. Will discharge home with pain medicine and Imodium for diarrhea. Recommend close follow-up with PCP as well as consider follow-up with general surgery if symptoms persist. Return precautions given.  Patient understands and agrees to plan. She is stable and ready for discharge.  Final Clinical Impressions(s) / ED Diagnoses   Final diagnoses:  Right upper quadrant abdominal pain  Diarrhea, unspecified type    New Prescriptions New Prescriptions   HYDROCODONE-ACETAMINOPHEN (NORCO/VICODIN) 5-325 MG TABLET    Take 1-2 tablets by mouth every 6 (six) hours as needed.     Montine Circle, PA-C 11/24/16 5248    Rolland Porter, MD 11/24/16 [phone]

## 2016-11-24 NOTE — ED Triage Notes (Signed)
Pt family reports that pt has been having diarrhea and abd pain that started Friday. Pt was started on  Nerylnx on 11/11/16. Pt is breast cancer pt with bilateral mastectomy.

## 2016-11-24 NOTE — Telephone Encounter (Signed)
Called to let pt know that she is to stop taking Nerlynx for 2 weeks until her gallbladder issues resolve. Pt to see Dr.Gudena prior to restarting medication in 2 weeks. Confirmed time/date of appt.

## 2016-11-25 ENCOUNTER — Ambulatory Visit (HOSPITAL_COMMUNITY)
Admission: RE | Admit: 2016-11-25 | Discharge: 2016-11-25 | Disposition: A | Discharge status: Home / Self Care | Payer: Self-pay | Source: Ambulatory Visit | Attending: Family Medicine | Admitting: Family Medicine

## 2016-11-25 ENCOUNTER — Ambulatory Visit (HOSPITAL_BASED_OUTPATIENT_CLINIC_OR_DEPARTMENT_OTHER)
Admission: RE | Admit: 2016-11-25 | Discharge: 2016-11-25 | Disposition: A | Discharge status: Home / Self Care | Payer: Self-pay | Source: Ambulatory Visit | Attending: Cardiology | Admitting: Cardiology

## 2016-11-25 ENCOUNTER — Encounter (HOSPITAL_COMMUNITY): Payer: Self-pay | Admitting: Cardiology

## 2016-11-25 VITALS — BP 183/96 | HR 74 | Wt 221.0 lb

## 2016-11-25 DIAGNOSIS — E876 Hypokalemia: Secondary | ICD-10-CM

## 2016-11-25 DIAGNOSIS — T451X5A Adverse effect of antineoplastic and immunosuppressive drugs, initial encounter: Secondary | ICD-10-CM | POA: Insufficient documentation

## 2016-11-25 DIAGNOSIS — G620 Drug-induced polyneuropathy: Secondary | ICD-10-CM

## 2016-11-25 DIAGNOSIS — I348 Other nonrheumatic mitral valve disorders: Secondary | ICD-10-CM | POA: Insufficient documentation

## 2016-11-25 DIAGNOSIS — Z853 Personal history of malignant neoplasm of breast: Secondary | ICD-10-CM | POA: Insufficient documentation

## 2016-11-25 DIAGNOSIS — I119 Hypertensive heart disease without heart failure: Secondary | ICD-10-CM | POA: Insufficient documentation

## 2016-11-25 MED ORDER — POTASSIUM CHLORIDE CRYS ER 10 MEQ PO TBCR: 20.0000 meq | EXTENDED_RELEASE_TABLET | Freq: Two times a day (BID) | ORAL | 3 refills | Status: DC

## 2016-11-25 MED ORDER — HYDROCHLOROTHIAZIDE 25 MG PO TABS: 25.0000 mg | ORAL_TABLET | Freq: Every day | ORAL | 3 refills | Status: DC

## 2016-11-25 NOTE — Progress Notes (Signed)
  Echocardiogram 2D Echocardiogram has been performed.  Felicia [name] 11/25/2016, 9:49 AM

## 2016-11-25 NOTE — Patient Instructions (Signed)
START taking hydrochlorothiazide 25 mg (1 Tablet) Once Daily  INCREASE potassium to 20 mEq (2 Tablets) Twice Daily  Labs in 10 days (bmet)   Follow up as needed.

## 2016-11-25 NOTE — Progress Notes (Signed)
[facility] CLIN[facility] CONSULT NOTE  Referring Physician: Dr Lindi Adie  HPI: [name] is a 56 year old with history of bilateral breast cancer.     Bilateral breast cancer ([place])   08/16/2015 Mammogram    Left breast LIQ 4.2 cm, 4 mm oval mass 12:00, 1.2 cm mass left UOQ, enlarged left axillary lymph node      08/16/2015 Mammogram    Right breast UIQ: 3.9 cm irregular mass, 1.5 cm mass in the right LOQ, 9 mm mass right UOQ, 6 mm mass right LIQ, enlarged lymph node Rt axilla      08/20/2015 Initial Diagnosis    Right breast biopsy 1:00: IDC grade 2 ER 95%, PR 2%, Ki 67 15%; HER-2 negative ratio 1.04; 10:00: IDC grade 2-3 dissimilar to 1:00 biopsy 10:00: ER 95%, PR 0%, K67: 25%, HER-2 positive ratio 6.42 right axillary lymph node positive for metastatic cancer      08/20/2015 Procedure    Left biopsy 8:00: IDC with DCIS, grade 2; ER 95%, PR 95%, Ki-67 30%, HER-2 negative ratio 1.2; 12:00: IDC with DCIS, grade 1 ER 95%, PR 95%, Ki-67 10% HER-2 negative ratio 1.14 not similar to this biopsy, left axillary lymph node positive,       09/13/2015 - 01/17/2016 Neo-Adjuvant Chemotherapy    Neoadjuvant dose dense Adriamycin and Cytoxan followed by Taxol Herceptin Perjeta, followed by Herceptin maintenance      01/21/2016 Breast MRI    Bilateral breast masses significantly decreased in size with mild residual NME, complete resolution of numerous other nodules in both breasts, bilateral axillary lymph nodes and bilateral retropectoral lymph nodes decreased in size      02/11/2016 Surgery    Right mastectomy: IDC 2 nodules, 2.5, 1 cm, margins negative, 3/7 lymph nodes positive T2 N1a stage II B, ER 95%, PR 2%, HER-2 negative, Ki-67 15% RCB burden 3.1 and 2.9 class II      02/11/2016 Surgery    Left mastectomy: IDC 3.1 cm with DCIS, margins negative, 0/8 lymph nodes, T2 N0 stage II a, RCB burden 2.18, class II, ER 100%, PR 95%, HER-2 negative ratio 1.2, Ki-67  30%       04/07/2016 - 05/15/2016 Radiation Therapy    Adjuvant radiation      No complaints today.  She recently finished Herceptin. No chest pain or dyspnea.  BP continues to run high.   ECHO 07/2015 EF 60-65% ECHO 01/11/2016 EF 60-65%. Lat S'11 GLS -19.3%   ECHO 1/18 EF 60-65%, GLS -21.3%, normal RV size and systolic function.  ECHO 4/18 EF 55-60%, GLS -22% ECHO 7/18 EF 55-60%, GLS -21.9%  FH: Great Grandmother - heart disease  Review of Systems: All systems reviewed and negative except as per HPI.   Past Medical History:  Diagnosis Date  . Cancer ([facility])    breast  . GERD (gastroesophageal reflux disease)     Current Outpatient Prescriptions  Medication Sig Dispense Refill  . amLODipine (NORVASC) 5 MG tablet Take 1 tablet (5 mg total) by mouth daily. 30 tablet 5  . Calcium Carb-Cholecalciferol (CALCIUM 600+D) 600-800 MG-UNIT TABS Take 1 tablet by mouth daily.    . cetirizine (ZYRTEC) 10 MG tablet Take 1 tablet (10 mg total) by mouth daily. 30 tablet 2  . Cholecalciferol (VITAMIN D-3) 5000 units TABS Take 1 tablet by mouth daily.    Marland Kitchen HYDROcodone-acetaminophen (NORCO/VICODIN) 5-325 MG tablet Take 1-2 tablets by mouth every 6 (six) hours as needed. 6 tablet 0  . letrozole [facility])  2.5 MG tablet Take 1 tablet (2.5 mg total) by mouth daily. 90 tablet 3  . lidocaine-prilocaine (EMLA) cream Apply 1 application topically as needed (Apply 2- hours prior to access of port). 30 g 1  . loperamide (IMODIUM A-D) 2 MG tablet Take 1 tablet (2 mg total) by mouth 4 (four) times daily as needed for diarrhea or loose stools. 30 tablet 0  . Neratinib Maleate (NERLYNX) 40 MG tablet Take 6 tablets (240 mg total) by mouth daily. Take with food. 180 tablet 3  . ondansetron (ZOFRAN ODT) 4 MG disintegrating tablet Take 1 tablet (4 mg total) by mouth every 8 (eight) hours as needed for nausea or vomiting. 10 tablet 0  . potassium chloride (K-DUR,KLOR-CON) 10 MEQ tablet Take 2 tablets (20 mEq  total) by mouth 2 (two) times daily. 120 tablet 3  . hydrochlorothiazide (HYDRODIURIL) 25 MG tablet Take 1 tablet (25 mg total) by mouth daily. 30 tablet 3   No current facility-administered medications for this encounter.     Allergies  Allergen Reactions  . No Known Allergies       Social History   Social History  . Marital status: Married    Spouse name: Elon Jester Real  . Number of children: 3  . Years of education: N/A   Occupational History  . Not on file.   Social History Main Topics  . Smoking status: Never Smoker  . Smokeless tobacco: Never Used  . Alcohol use No  . Drug use: No  . Sexual activity: Yes    Birth control/ protection: Surgical   Other Topics Concern  . Not on file   Social History Narrative   3 children    Felicia [name] age 32, lives in [place]    Felicia [name] age 49 lives in [place]   Felicia [name] age 50 lives in [place]      Family History  Problem Relation Age of Onset  . Diabetes Mother     Vitals:   11/25/16 0956  BP: (!) 183/96  Pulse: 74  SpO2: 97%  Weight: 221 lb (100.2 kg)    PHYSICAL EXAM: General: NAD Neck: No JVD, no thyromegaly or thyroid nodule.  Lungs: Clear to auscultation bilaterally with normal respiratory effort. CV: Nondisplaced PMI.  Heart regular S1/S2, no S3/S4, no murmur.  Trace ankle edema.  No carotid bruit.  Normal pedal pulses.  Abdomen: Soft, nontender, no hepatosplenomegaly, no distention.  Skin: Intact without lesions or rashes.  Neurologic: Alert and oriented x 3.  Psych: Normal affect. Extremities: No clubbing or cyanosis.  HEENT: Normal.   ASSESSMENT & PLAN:    1. Breast cancer: She was treated with Adriamycin/Cytoxan in 5/17, this treatment has been completed.  She was then started on Taxol/Herceptin/Perjeta.  Herceptin continued a year until 7/18. I reviewed today's echo: EF and strain parameters stable.  She will not need further echoes now that she has completed  Herceptin. 2. HTN: Cough with lisinopril.  She was not able to tolerate increase in amlodipine to 10 mg daily. - Continue amlodipine at 5 mg daily and add HCTZ 25 mg daily. Increase KCl to 20 mEq bid.  BMET in 10 days.    [name] 11/25/2016

## 2016-12-03 ENCOUNTER — Other Ambulatory Visit: Payer: Self-pay | Admitting: Hematology and Oncology

## 2016-12-05 ENCOUNTER — Ambulatory Visit (HOSPITAL_BASED_OUTPATIENT_CLINIC_OR_DEPARTMENT_OTHER): Payer: Self-pay

## 2016-12-05 ENCOUNTER — Encounter: Payer: Self-pay | Admitting: Hematology and Oncology

## 2016-12-05 ENCOUNTER — Other Ambulatory Visit (HOSPITAL_BASED_OUTPATIENT_CLINIC_OR_DEPARTMENT_OTHER): Payer: Self-pay

## 2016-12-05 ENCOUNTER — Ambulatory Visit (HOSPITAL_BASED_OUTPATIENT_CLINIC_OR_DEPARTMENT_OTHER): Payer: Self-pay | Admitting: Hematology and Oncology

## 2016-12-05 DIAGNOSIS — Z79811 Long term (current) use of aromatase inhibitors: Secondary | ICD-10-CM

## 2016-12-05 DIAGNOSIS — Z95828 Presence of other vascular implants and grafts: Secondary | ICD-10-CM

## 2016-12-05 DIAGNOSIS — C50011 Malignant neoplasm of nipple and areola, right female breast: Secondary | ICD-10-CM

## 2016-12-05 DIAGNOSIS — Z170 Estrogen receptor positive status [ER+]: Principal | ICD-10-CM

## 2016-12-05 DIAGNOSIS — C50211 Malignant neoplasm of upper-inner quadrant of right female breast: Secondary | ICD-10-CM

## 2016-12-05 DIAGNOSIS — C50411 Malignant neoplasm of upper-outer quadrant of right female breast: Secondary | ICD-10-CM

## 2016-12-05 DIAGNOSIS — C50312 Malignant neoplasm of lower-inner quadrant of left female breast: Secondary | ICD-10-CM

## 2016-12-05 DIAGNOSIS — C50012 Malignant neoplasm of nipple and areola, left female breast: Principal | ICD-10-CM

## 2016-12-05 DIAGNOSIS — G620 Drug-induced polyneuropathy: Secondary | ICD-10-CM

## 2016-12-05 DIAGNOSIS — C773 Secondary and unspecified malignant neoplasm of axilla and upper limb lymph nodes: Secondary | ICD-10-CM

## 2016-12-05 DIAGNOSIS — C50812 Malignant neoplasm of overlapping sites of left female breast: Secondary | ICD-10-CM

## 2016-12-05 DIAGNOSIS — Z9013 Acquired absence of bilateral breasts and nipples: Secondary | ICD-10-CM

## 2016-12-05 LAB — COMPREHENSIVE METABOLIC PANEL
ALBUMIN: 3.7 g/dL (ref 3.5–5.0)
ALK PHOS: 93 U/L (ref 40–150)
ALT: 40 U/L (ref 0–55)
AST: 31 U/L (ref 5–34)
Anion Gap: 8 mEq/L (ref 3–11)
BUN: 13.5 mg/dL (ref 7.0–26.0)
CALCIUM: 10.1 mg/dL (ref 8.4–10.4)
CO2: 28 mEq/L (ref 22–29)
Chloride: 103 mEq/L (ref 98–109)
Creatinine: 0.7 mg/dL (ref 0.6–1.1)
Glucose: 137 mg/dl (ref 70–140)
POTASSIUM: 3.2 meq/L — AB (ref 3.5–5.1)
SODIUM: 138 meq/L (ref 136–145)
Total Bilirubin: 0.63 mg/dL (ref 0.20–1.20)
Total Protein: 7.3 g/dL (ref 6.4–8.3)

## 2016-12-05 LAB — CBC WITH DIFFERENTIAL/PLATELET
BASO%: 0.7 % (ref 0.0–2.0)
BASOS ABS: 0 10*3/uL (ref 0.0–0.1)
EOS ABS: 0.1 10*3/uL (ref 0.0–0.5)
EOS%: 1.8 % (ref 0.0–7.0)
HEMATOCRIT: 39 % (ref 34.8–46.6)
HEMOGLOBIN: 13.5 g/dL (ref 11.6–15.9)
LYMPH%: 30.7 % (ref 14.0–49.7)
MCH: 29.4 pg (ref 25.1–34.0)
MCHC: 34.6 g/dL (ref 31.5–36.0)
MCV: 85.1 fL (ref 79.5–101.0)
MONO#: 0.4 10*3/uL (ref 0.1–0.9)
MONO%: 6.8 % (ref 0.0–14.0)
NEUT#: 3.4 10*3/uL (ref 1.5–6.5)
NEUT%: 60 % (ref 38.4–76.8)
Platelets: 188 10*3/uL (ref 145–400)
RBC: 4.58 10*6/uL (ref 3.70–5.45)
RDW: 12.9 % (ref 11.2–14.5)
WBC: 5.6 10*3/uL (ref 3.9–10.3)
lymph#: 1.7 10*3/uL (ref 0.9–3.3)

## 2016-12-05 MED ORDER — DIPHENOXYLATE-ATROPINE 2.5-0.025 MG PO TABS: 1.0000 | ORAL_TABLET | Freq: Four times a day (QID) | ORAL | 0 refills | Status: DC | PRN

## 2016-12-05 MED ORDER — HEPARIN SOD (PORK) LOCK FLUSH 100 UNIT/ML IV SOLN
500.0000 [IU] | Freq: Once | INTRAVENOUS | Status: AC | PRN
  Administered 2016-12-05: 500 [IU] via INTRAVENOUS
  Filled 2016-12-05: qty 5

## 2016-12-05 MED ORDER — SODIUM CHLORIDE 0.9% FLUSH
10.0000 mL | INTRAVENOUS | Status: DC | PRN
  Administered 2016-12-05: 10 mL via INTRAVENOUS
  Filled 2016-12-05: qty 10

## 2016-12-05 NOTE — Assessment & Plan Note (Signed)
Treatment Summary: 1. Neoadjuvant chemotherapy with either AC followed by THP started 09/13/2015 and completed 12 weeks of Taxol 11/29/2015, Herceptin Perjeta 6; now on Herceptin  2. Bilateral mastectomies and lymph node surgery 02/11/16 3. Adjuvant radiation 04/07/2016 to 05/15/2016 4. Adjuvant antiestrogen therapy with Letrozole started 06/26/2016 5. 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 Herceptin maintenance completed July 2018  Current treatment:  1. Adjuvant antiestrogen therapy with letrozole 2.5 mg daily started 06/26/2016 2. Neratinib 240 mg daily started 11/20/2016  Neratinib toxicities:  Return to clinic in one month for follow-up 

## 2016-12-05 NOTE — Progress Notes (Signed)
Patient Care Team: Arnoldo Morale, MD as PCP - General (Family Medicine)  DIAGNOSIS:  Encounter Diagnosis  Name Primary?  . 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, estrogen receptor positive ([place])     SUMMARY OF ONCOLOGIC HISTORY:   Bilateral breast cancer ([place])   08/16/2015 Mammogram    Left breast LIQ 4.2 cm, 4 mm oval mass 12:00, 1.2 cm mass left UOQ, enlarged left axillary lymph node      08/16/2015 Mammogram    Right breast UIQ: 3.9 cm irregular mass, 1.5 cm mass in the right LOQ, 9 mm mass right UOQ, 6 mm mass right LIQ, enlarged lymph node Rt axilla      08/20/2015 Initial Diagnosis    Right breast biopsy 1:00: IDC grade 2 ER 95%, PR 2%, Ki 67 15%; HER-2 negative ratio 1.04; 10:00: IDC grade 2-3 dissimilar to 1:00 biopsy 10:00: ER 95%, PR 0%, K67: 25%, HER-2 positive ratio 6.42 right axillary lymph node positive for metastatic cancer      08/20/2015 Procedure    Left biopsy 8:00: IDC with DCIS, grade 2; ER 95%, PR 95%, Ki-67 30%, HER-2 negative ratio 1.2; 12:00: IDC with DCIS, grade 1 ER 95%, PR 95%, Ki-67 10% HER-2 negative ratio 1.14 not similar to this biopsy, left axillary lymph node positive,       09/13/2015 - 01/17/2016 Neo-Adjuvant Chemotherapy    Neoadjuvant dose dense Adriamycin and Cytoxan followed by Taxol Herceptin Perjeta, followed by Herceptin maintenance      01/21/2016 Breast MRI    Bilateral breast masses significantly decreased in size with mild residual NME, complete resolution of numerous other nodules in both breasts, bilateral axillary lymph nodes and bilateral retropectoral lymph nodes decreased in size      02/11/2016 Surgery    Right mastectomy: IDC 2 nodules, 2.5, 1 cm, margins negative, 3/7 lymph nodes positive T2 N1a stage II B, ER 95%, PR 2%, HER-2 negative, Ki-67 15% RCB burden 3.1 and 2.9 class II      02/11/2016 Surgery    Left mastectomy: IDC 3.1 cm with DCIS, margins negative, 0/8 lymph nodes, T2 N0 stage II a, RCB burden  2.18, class II, ER 100%, PR 95%, HER-2 negative ratio 1.2, Ki-67 30%       04/07/2016 - 05/15/2016 Radiation Therapy    Adjuvant radiation      06/26/2016 -  Anti-estrogen oral therapy    Letrozole 2.5 mg daily      11/20/2016 Miscellaneous    Neratinib 240 mg daily       CHIEF COMPLIANT: Patient stopped Neratinib because of gallbladder stone  INTERVAL HISTORY: Felicia [name] is a 56 year old with above-mentioned history of breast cancer who is currently on Neratinib. She started it last week and then couple of days later she went to the ED with abdominal pain and was diagnosed with gallstones. The pain has improved. She stop the Neurontin at the because it was also causing diarrhea. The diarrhea has subsided since she stopped Neratinib. She wants to know what to do about it further.  REVIEW OF SYSTEMS:   Constitutional: Denies fevers, chills or abnormal weight loss Eyes: Denies blurriness of vision Ears, nose, mouth, throat, and face: Denies mucositis or sore throat Respiratory: Denies cough, dyspnea or wheezes Cardiovascular: Denies palpitation, chest discomfort Gastrointestinal: Abdominal pain related to gallbladder stone. Skin: Denies abnormal skin rashes Lymphatics: Denies new lymphadenopathy or easy bruising Neurological:Denies numbness, tingling or new weaknesses Behavioral/Psych: Mood is stable, no new changes  Extremities: No lower extremity edema Breast:  denies any pain or lumps or nodules in either breasts All other systems were reviewed with the patient and are negative.  I have reviewed the past medical history, past surgical history, social history and family history with the patient and they are unchanged from previous note.  ALLERGIES:  is allergic to no known allergies.  MEDICATIONS:  Current Outpatient Prescriptions  Medication Sig Dispense Refill  . amLODipine (NORVASC) 5 MG tablet Take 1 tablet (5 mg total) by mouth daily. 30 tablet 5  .  Calcium Carb-Cholecalciferol (CALCIUM 600+D) 600-800 MG-UNIT TABS Take 1 tablet by mouth daily.    . cetirizine (ZYRTEC) 10 MG tablet Take 1 tablet (10 mg total) by mouth daily. 30 tablet 2  . Cholecalciferol (VITAMIN D-3) 5000 units TABS Take 1 tablet by mouth daily.    . diphenoxylate-atropine (LOMOTIL) 2.5-0.025 MG tablet Take 1 tablet by mouth 4 (four) times daily as needed for diarrhea or loose stools. 60 tablet 0  . hydrochlorothiazide (HYDRODIURIL) 25 MG tablet Take 1 tablet (25 mg total) by mouth daily. 30 tablet 3  . HYDROcodone-acetaminophen (NORCO/VICODIN) 5-325 MG tablet Take 1-2 tablets by mouth every 6 (six) hours as needed. 6 tablet 0  . letrozole (FEMARA) 2.5 MG tablet Take 1 tablet (2.5 mg total) by mouth daily. 90 tablet 3  . lidocaine-prilocaine (EMLA) cream Apply 1 application topically as needed (Apply 2- hours prior to access of port). 30 g 1  . loperamide (IMODIUM A-D) 2 MG tablet Take 1 tablet (2 mg total) by mouth 4 (four) times daily as needed for diarrhea or loose stools. 30 tablet 0  . NERLYNX 40 MG tablet TAKE 6 TABLETS ('240MG'$ ) BY MOUTH ONCE DAILY 180 tablet 0  . ondansetron (ZOFRAN ODT) 4 MG disintegrating tablet Take 1 tablet (4 mg total) by mouth every 8 (eight) hours as needed for nausea or vomiting. 10 tablet 0  . potassium chloride (K-DUR,KLOR-CON) 10 MEQ tablet Take 2 tablets (20 mEq total) by mouth 2 (two) times daily. 120 tablet 3   No current facility-administered medications for this visit.     PHYSICAL EXAMINATION: ECOG PERFORMANCE STATUS: 1 - Symptomatic but completely ambulatory  Vitals:   12/05/16 0906  BP: (!) 164/75  Pulse: 62  Resp: 18  Temp: 97.6 F (36.4 C)  SpO2: 99%   Filed Weights   12/05/16 0906  Weight: 226 lb 1.6 oz (102.6 kg)    GENERAL:alert, no distress and comfortable SKIN: skin color, texture, turgor are normal, no rashes or significant lesions EYES: normal, Conjunctiva are pink and non-injected, sclera  clear OROPHARYNX:no exudate, no erythema and lips, buccal mucosa, and tongue normal  NECK: supple, thyroid normal size, non-tender, without nodularity LYMPH:  no palpable lymphadenopathy in the cervical, axillary or inguinal LUNGS: clear to auscultation and percussion with normal breathing effort HEART: regular rate & rhythm and no murmurs and no lower extremity edema ABDOMEN:abdomen soft, non-tender and normal bowel sounds MUSCULOSKELETAL:no cyanosis of digits and no clubbing  NEURO: alert & oriented x 3 with fluent speech, no focal motor/sensory deficits EXTREMITIES: No lower extremity edema   LABORATORY DATA:  I have reviewed the data as listed   Chemistry      Component Value Date/Time   NA 138 12/05/2016 0837   K 3.2 (L) 12/05/2016 0837   CL 105 11/24/2016 0316   CO2 28 12/05/2016 0837   BUN 13.5 12/05/2016 0837   CREATININE 0.7 12/05/2016 0160  Component Value Date/Time   CALCIUM 10.1 12/05/2016 0837   ALKPHOS 93 12/05/2016 0837   AST 31 12/05/2016 0837   ALT 40 12/05/2016 0837   BILITOT 0.63 12/05/2016 0837       Lab Results  Component Value Date   WBC 5.6 12/05/2016   HGB 13.5 12/05/2016   HCT 39.0 12/05/2016   MCV 85.1 12/05/2016   PLT 188 12/05/2016   NEUTROABS 3.4 12/05/2016    ASSESSMENT & PLAN:  Bilateral breast cancer ([place]) Treatment Summary: 1. Neoadjuvant chemotherapy with either AC followed by THP started 09/13/2015 and completed 12 weeks of Taxol 11/29/2015, Herceptin Perjeta 6; now on Herceptin  2. Bilateral mastectomies and lymph node surgery 02/11/16 3. Adjuvant radiation 04/07/2016 to 05/15/2016 4. Adjuvant antiestrogen therapy with Letrozole started 06/26/2016 5. 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 Herceptin maintenance completed July 2018  Current treatment:  1. Adjuvant antiestrogen therapy with letrozole 2.5 mg daily started 06/26/2016 2. Neratinib 240 mg daily started 11/20/2016  Neratinib toxicities: Diarrhea: I instructed her to take prophylactic Imodium 3 times a day. In addition I gave her a prescription for Lomotil. I instructed her to follow these instructions for 2 weeks. If necessary we can reduce the dosage of Neratinib.  Hypokalemia: On 4 tablets of potassium daily. Encouraged her to continue with the same since a potassium 3.2 today. Return to clinic in 2 weeks for follow-up   I spent 25 minutes talking to the patient of which more than half was spent in counseling and coordination of care.  No orders of the defined types were placed in this encounter.  The patient has a good understanding of the overall plan. she agrees with it. she will call with any problems that may develop before the next visit here.   Rulon Eisenmenger, MD 12/05/16

## 2016-12-18 ENCOUNTER — Other Ambulatory Visit (HOSPITAL_BASED_OUTPATIENT_CLINIC_OR_DEPARTMENT_OTHER): Payer: Self-pay

## 2016-12-18 ENCOUNTER — Other Ambulatory Visit: Payer: Self-pay

## 2016-12-18 ENCOUNTER — Ambulatory Visit (HOSPITAL_BASED_OUTPATIENT_CLINIC_OR_DEPARTMENT_OTHER): Payer: Self-pay

## 2016-12-18 ENCOUNTER — Ambulatory Visit (HOSPITAL_BASED_OUTPATIENT_CLINIC_OR_DEPARTMENT_OTHER): Payer: Self-pay | Admitting: Hematology and Oncology

## 2016-12-18 ENCOUNTER — Ambulatory Visit: Payer: Self-pay | Admitting: Hematology and Oncology

## 2016-12-18 ENCOUNTER — Telehealth: Payer: Self-pay | Admitting: Pharmacist

## 2016-12-18 ENCOUNTER — Encounter: Payer: Self-pay | Admitting: Hematology and Oncology

## 2016-12-18 DIAGNOSIS — C50211 Malignant neoplasm of upper-inner quadrant of right female breast: Secondary | ICD-10-CM

## 2016-12-18 DIAGNOSIS — Z95828 Presence of other vascular implants and grafts: Secondary | ICD-10-CM

## 2016-12-18 DIAGNOSIS — Z9013 Acquired absence of bilateral breasts and nipples: Secondary | ICD-10-CM

## 2016-12-18 DIAGNOSIS — C50011 Malignant neoplasm of nipple and areola, right female breast: Secondary | ICD-10-CM

## 2016-12-18 DIAGNOSIS — C50012 Malignant neoplasm of nipple and areola, left female breast: Secondary | ICD-10-CM

## 2016-12-18 DIAGNOSIS — C50411 Malignant neoplasm of upper-outer quadrant of right female breast: Secondary | ICD-10-CM

## 2016-12-18 DIAGNOSIS — C773 Secondary and unspecified malignant neoplasm of axilla and upper limb lymph nodes: Secondary | ICD-10-CM

## 2016-12-18 DIAGNOSIS — Z79811 Long term (current) use of aromatase inhibitors: Secondary | ICD-10-CM

## 2016-12-18 DIAGNOSIS — C50812 Malignant neoplasm of overlapping sites of left female breast: Principal | ICD-10-CM

## 2016-12-18 DIAGNOSIS — C50312 Malignant neoplasm of lower-inner quadrant of left female breast: Secondary | ICD-10-CM

## 2016-12-18 DIAGNOSIS — Z170 Estrogen receptor positive status [ER+]: Secondary | ICD-10-CM

## 2016-12-18 DIAGNOSIS — C50811 Malignant neoplasm of overlapping sites of right female breast: Secondary | ICD-10-CM

## 2016-12-18 DIAGNOSIS — E876 Hypokalemia: Secondary | ICD-10-CM

## 2016-12-18 LAB — COMPREHENSIVE METABOLIC PANEL
ALK PHOS: 77 U/L (ref 40–150)
ALT: 44 U/L (ref 0–55)
AST: 36 U/L — AB (ref 5–34)
Albumin: 3.7 g/dL (ref 3.5–5.0)
Anion Gap: 7 mEq/L (ref 3–11)
BILIRUBIN TOTAL: 0.74 mg/dL (ref 0.20–1.20)
BUN: 12.9 mg/dL (ref 7.0–26.0)
CALCIUM: 9.4 mg/dL (ref 8.4–10.4)
CHLORIDE: 104 meq/L (ref 98–109)
CO2: 26 meq/L (ref 22–29)
CREATININE: 0.7 mg/dL (ref 0.6–1.1)
EGFR: >90 mL/min/{1.73_m2} (ref >90)
GLUCOSE: 94 mg/dL (ref 70–140)
POTASSIUM: 3.1 meq/L — AB (ref 3.5–5.1)
SODIUM: 138 meq/L (ref 136–145)
Total Protein: 7.2 g/dL (ref 6.4–8.3)

## 2016-12-18 LAB — CBC WITH DIFFERENTIAL/PLATELET
BASO%: 0.3 % (ref 0.0–2.0)
BASOS ABS: 0 10*3/uL (ref 0.0–0.1)
EOS%: 1.5 % (ref 0.0–7.0)
Eosinophils Absolute: 0.1 10*3/uL (ref 0.0–0.5)
HCT: 37.9 % (ref 34.8–46.6)
HGB: 13 g/dL (ref 11.6–15.9)
LYMPH%: 31 % (ref 14.0–49.7)
MCH: 29 pg (ref 25.1–34.0)
MCHC: 34.3 g/dL (ref 31.5–36.0)
MCV: 84.6 fL (ref 79.5–101.0)
MONO#: 0.3 10*3/uL (ref 0.1–0.9)
MONO%: 4.4 % (ref 0.0–14.0)
NEUT#: 3.9 10*3/uL (ref 1.5–6.5)
NEUT%: 62.8 % (ref 38.4–76.8)
Platelets: 200 10*3/uL (ref 145–400)
RBC: 4.48 10*6/uL (ref 3.70–5.45)
RDW: 12.7 % (ref 11.2–14.5)
WBC: 6.2 10*3/uL (ref 3.9–10.3)
lymph#: 1.9 10*3/uL (ref 0.9–3.3)

## 2016-12-18 MED ORDER — SODIUM CHLORIDE 0.9% FLUSH
10.0000 mL | INTRAVENOUS | Status: DC | PRN
  Administered 2016-12-18: 10 mL via INTRAVENOUS
  Filled 2016-12-18: qty 10

## 2016-12-18 MED ORDER — HEPARIN SOD (PORK) LOCK FLUSH 100 UNIT/ML IV SOLN
500.0000 [IU] | Freq: Once | INTRAVENOUS | Status: AC | PRN
  Administered 2016-12-18: 500 [IU] via INTRAVENOUS
  Filled 2016-12-18: qty 5

## 2016-12-18 NOTE — Telephone Encounter (Signed)
Oral Chemotherapy Pharmacist Encounter  Follow-Up Form  Called spoke with patient, patient's daughter, Olin Hauser, and Lavon Paganini, [place] interpreter, today to follow up regarding patient's oral chemotherapy medication: Nerlynx for the maintenance adjuvant treatment of of hormone-receptor positive, Her-2 positive breast cancer after completion of 1 year of adjuvant Herceptin , planned duration 1 year.   Original Start date of oral chemotherapy: 11/20/16  Pt is doing well today  Pt reports 0 tablets/doses of Nerlynx 72m tablets, 6 tablets (2486m by mouth once daily with food missed in the last 2 weeks.   Pt reports the following side effects: some diarrhea, 1-2 BMs/day with the use of imodium 75m71mO TID and lomotil for breakthrough symptoms.  Patient states this is manageable and does not interfere with her ADLs.  She is willing to continue this diarrhea prophylaxis regimen.  Patient understands goal is 1-2 BMS/day.  She will let this office know if she has increase or decrease in daily number.  Patient understands diarrhea will likely decrease or subside by the end of month 2 of therapy.  Pertinent labs reviewed: OK for continue treatment.  Other Issues: none to report  Patient knows to call the office with questions or concerns. [facility] will continue to follow.  Thank you,  JesJohny DrillingharmD, BCPS, BCOP 12/18/2016 11:49 AM [facility] [phone]

## 2016-12-18 NOTE — Assessment & Plan Note (Signed)
Treatment Summary: 1. Neoadjuvant chemotherapy with either AC followed by THP started 09/13/2015 and completed 12 weeks of Taxol 11/29/2015, Herceptin Perjeta 6; now on Herceptin  2. Bilateral mastectomies and lymph node surgery 02/11/16 3. Adjuvant radiation 04/07/2016 to 05/15/2016 4. Adjuvant antiestrogen therapy with Letrozole started 06/26/2016 5. 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 Herceptin maintenance completed July 2018  Current treatment:  1. Adjuvant antiestrogen therapy with letrozole 2.5 mg daily started 06/26/2016 2. Neratinib 240 mg daily started 11/20/2016  Neratinib toxicities: Diarrhea: I instructed her to take prophylactic Imodium 3 times a day. In addition I gave her a prescription for Lomotil. I instructed her to follow these instructions for 2 weeks. If necessary we can reduce the dosage of Neratinib.  Hypokalemia: On 4 tablets of potassium daily. Encouraged her to continue with the same since a potassium 3.2 today. Return to clinic in 4 weeks for follow-up

## 2016-12-18 NOTE — Progress Notes (Signed)
Patient Care Team: Arnoldo Morale, MD as PCP - General (Family Medicine)  DIAGNOSIS:  Encounter Diagnosis  Name Primary?  . 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, estrogen receptor positive ([place])     SUMMARY OF ONCOLOGIC HISTORY:   Bilateral breast cancer ([place])   08/16/2015 Mammogram    Left breast LIQ 4.2 cm, 4 mm oval mass 12:00, 1.2 cm mass left UOQ, enlarged left axillary lymph node      08/16/2015 Mammogram    Right breast UIQ: 3.9 cm irregular mass, 1.5 cm mass in the right LOQ, 9 mm mass right UOQ, 6 mm mass right LIQ, enlarged lymph node Rt axilla      08/20/2015 Initial Diagnosis    Right breast biopsy 1:00: IDC grade 2 ER 95%, PR 2%, Ki 67 15%; HER-2 negative ratio 1.04; 10:00: IDC grade 2-3 dissimilar to 1:00 biopsy 10:00: ER 95%, PR 0%, K67: 25%, HER-2 positive ratio 6.42 right axillary lymph node positive for metastatic cancer      08/20/2015 Procedure    Left biopsy 8:00: IDC with DCIS, grade 2; ER 95%, PR 95%, Ki-67 30%, HER-2 negative ratio 1.2; 12:00: IDC with DCIS, grade 1 ER 95%, PR 95%, Ki-67 10% HER-2 negative ratio 1.14 not similar to this biopsy, left axillary lymph node positive,       09/13/2015 - 01/17/2016 Neo-Adjuvant Chemotherapy    Neoadjuvant dose dense Adriamycin and Cytoxan followed by Taxol Herceptin Perjeta, followed by Herceptin maintenance      01/21/2016 Breast MRI    Bilateral breast masses significantly decreased in size with mild residual NME, complete resolution of numerous other nodules in both breasts, bilateral axillary lymph nodes and bilateral retropectoral lymph nodes decreased in size      02/11/2016 Surgery    Right mastectomy: IDC 2 nodules, 2.5, 1 cm, margins negative, 3/7 lymph nodes positive T2 N1a stage II B, ER 95%, PR 2%, HER-2 negative, Ki-67 15% RCB burden 3.1 and 2.9 class II      02/11/2016 Surgery    Left mastectomy: IDC 3.1 cm with DCIS, margins negative, 0/8 lymph nodes, T2 N0 stage II a, RCB burden  2.18, class II, ER 100%, PR 95%, HER-2 negative ratio 1.2, Ki-67 30%       04/07/2016 - 05/15/2016 Radiation Therapy    Adjuvant radiation      06/26/2016 -  Anti-estrogen oral therapy    Letrozole 2.5 mg daily      11/20/2016 Miscellaneous    Neratinib 240 mg daily       CHIEF COMPLIANT:  Follow-up on Neratinib, diarrhea is in a very controlled, nausea vomiting uncontrolled  INTERVAL HISTORY: Kiaraliz Rafuse is a  56 year old with above-mentioned history of HER-2 positive breast cancer who is currently on Neratinib. Finally she is tolerating it much better. Her diarrhea is under much better control. She takes prophylactic Imodium 3 times a day. She also take Lomotil as needed. She has not noted  Lomotil as much. Denies any other complaints.  REVIEW OF SYSTEMS:   Constitutional: Denies fevers, chills or abnormal weight loss Eyes: Denies blurriness of vision Ears, nose, mouth, throat, and face: Denies mucositis or sore throat Respiratory: Denies cough, dyspnea or wheezes Cardiovascular: Denies palpitation, chest discomfort Gastrointestinal:   Intermittent loose stools Skin: Denies abnormal skin rashes Lymphatics: Denies new lymphadenopathy or easy bruising Neurological:Denies numbness, tingling or new weaknesses Behavioral/Psych: Mood is stable, no new changes  Extremities: No lower extremity edema Breast:  denies any  pain or lumps or nodules in either breasts All other systems were reviewed with the patient and are negative.  I have reviewed the past medical history, past surgical history, social history and family history with the patient and they are unchanged from previous note.  ALLERGIES:  is allergic to no known allergies.  MEDICATIONS:  Current Outpatient Prescriptions  Medication Sig Dispense Refill  . amLODipine (NORVASC) 5 MG tablet Take 1 tablet (5 mg total) by mouth daily. 30 tablet 5  . Calcium Carb-Cholecalciferol (CALCIUM 600+D) 600-800 MG-UNIT  TABS Take 1 tablet by mouth daily.    . cetirizine (ZYRTEC) 10 MG tablet Take 1 tablet (10 mg total) by mouth daily. 30 tablet 2  . Cholecalciferol (VITAMIN D-3) 5000 units TABS Take 1 tablet by mouth daily.    . diphenoxylate-atropine (LOMOTIL) 2.5-0.025 MG tablet Take 1 tablet by mouth 4 (four) times daily as needed for diarrhea or loose stools. 60 tablet 0  . hydrochlorothiazide (HYDRODIURIL) 25 MG tablet Take 1 tablet (25 mg total) by mouth daily. 30 tablet 3  . HYDROcodone-acetaminophen (NORCO/VICODIN) 5-325 MG tablet Take 1-2 tablets by mouth every 6 (six) hours as needed. 6 tablet 0  . letrozole (FEMARA) 2.5 MG tablet Take 1 tablet (2.5 mg total) by mouth daily. 90 tablet 3  . lidocaine-prilocaine (EMLA) cream Apply 1 application topically as needed (Apply 2- hours prior to access of port). 30 g 1  . loperamide (IMODIUM A-D) 2 MG tablet Take 1 tablet (2 mg total) by mouth 4 (four) times daily as needed for diarrhea or loose stools. 30 tablet 0  . NERLYNX 40 MG tablet TAKE 6 TABLETS ('240MG'$ ) BY MOUTH ONCE DAILY 180 tablet 0  . ondansetron (ZOFRAN ODT) 4 MG disintegrating tablet Take 1 tablet (4 mg total) by mouth every 8 (eight) hours as needed for nausea or vomiting. 10 tablet 0  . potassium chloride (K-DUR,KLOR-CON) 10 MEQ tablet Take 2 tablets (20 mEq total) by mouth 2 (two) times daily. 120 tablet 3   No current facility-administered medications for this visit.     PHYSICAL EXAMINATION: ECOG PERFORMANCE STATUS: 1 - Symptomatic but completely ambulatory  Vitals:   12/18/16 1116  BP: (!) 190/97  Pulse: 64  Resp: 18  Temp: 97.8 F (36.6 C)  SpO2: 100%   Filed Weights   12/18/16 1116  Weight: 221 lb 1.6 oz (100.3 kg)    GENERAL:alert, no distress and comfortable SKIN: skin color, texture, turgor are normal, no rashes or significant lesions EYES: normal, Conjunctiva are pink and non-injected, sclera clear OROPHARYNX:no exudate, no erythema and lips, buccal mucosa, and tongue  normal  NECK: supple, thyroid normal size, non-tender, without nodularity LYMPH:  no palpable lymphadenopathy in the cervical, axillary or inguinal LUNGS: clear to auscultation and percussion with normal breathing effort HEART: regular rate & rhythm and no murmurs and no lower extremity edema ABDOMEN:abdomen soft, non-tender and normal bowel sounds MUSCULOSKELETAL:no cyanosis of digits and no clubbing  NEURO: alert & oriented x 3 with fluent speech, no focal motor/sensory deficits EXTREMITIES: No lower extremity edema  LABORATORY DATA:  I have reviewed the data as listed   Chemistry      Component Value Date/Time   NA 138 12/18/2016 1036   K 3.1 (L) 12/18/2016 1036   CL 105 11/24/2016 0316   CO2 26 12/18/2016 1036   BUN 12.9 12/18/2016 1036   CREATININE 0.7 12/18/2016 1036      Component Value Date/Time   CALCIUM 9.4 12/18/2016 1036  ALKPHOS 77 12/18/2016 1036   AST 36 (H) 12/18/2016 1036   ALT 44 12/18/2016 1036   BILITOT 0.74 12/18/2016 1036       Lab Results  Component Value Date   WBC 6.2 12/18/2016   HGB 13.0 12/18/2016   HCT 37.9 12/18/2016   MCV 84.6 12/18/2016   PLT 200 12/18/2016   NEUTROABS 3.9 12/18/2016    ASSESSMENT & PLAN:  Bilateral breast cancer ([place]) Treatment Summary: 1. Neoadjuvant chemotherapy with either AC followed by THP started 09/13/2015 and completed 12 weeks of Taxol 11/29/2015, Herceptin Perjeta 6; now on Herceptin  2. Bilateral mastectomies and lymph node surgery 02/11/16 3. Adjuvant radiation 04/07/2016 to 05/15/2016 4. Adjuvant antiestrogen therapy with Letrozole started 06/26/2016 5. 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 Herceptin maintenance completed July 2018  Current treatment:  1. Adjuvant antiestrogen therapy with letrozole 2.5 mg daily started 06/26/2016 2. Neratinib 240 mg daily started 11/20/2016  Neratinib toxicities: Diarrhea: I instructed her to take prophylactic Imodium 3 times a day. In addition I gave her a prescription for Lomotil. I instructed her to follow these instructions for 2 weeks. If necessary we can reduce the dosage of Neratinib.  Hypokalemia: On 4 tablets of potassium daily. Encouraged her to continue with the same since a potassium 3.2 today. Return to clinic in 4 weeks for follow-up   I spent 25 minutes talking to the patient of which more than half was spent in counseling and coordination of care.  Orders Placed This Encounter  Procedures  . CBC with Differential    Standing Status:   Future    Standing Expiration Date:   12/18/2017  . Comprehensive metabolic panel    Standing Status:   Future    Standing Expiration Date:   12/18/2017   The patient has a good understanding of the overall plan. she agrees with it. she will call with any problems that may develop before the next visit here.   Rulon Eisenmenger, MD 12/18/16

## 2016-12-24 ENCOUNTER — Other Ambulatory Visit: Payer: Self-pay

## 2016-12-24 DIAGNOSIS — E876 Hypokalemia: Secondary | ICD-10-CM

## 2016-12-24 MED ORDER — POTASSIUM CHLORIDE CRYS ER 10 MEQ PO TBCR: 20.0000 meq | EXTENDED_RELEASE_TABLET | Freq: Two times a day (BID) | ORAL | 3 refills | Status: DC

## 2016-12-25 NOTE — Telephone Encounter (Signed)
Error

## 2016-12-27 ENCOUNTER — Other Ambulatory Visit: Payer: Self-pay | Admitting: Hematology and Oncology

## 2017-01-13 ENCOUNTER — Telehealth: Payer: Self-pay | Admitting: Hematology and Oncology

## 2017-01-13 NOTE — Telephone Encounter (Signed)
Scheduled patient for next week because they wanted to reschedule. Patient's interpretor is going to call them with the information.

## 2017-01-15 ENCOUNTER — Ambulatory Visit: Payer: Self-pay | Admitting: Hematology and Oncology

## 2017-01-15 ENCOUNTER — Other Ambulatory Visit: Payer: Self-pay

## 2017-01-19 ENCOUNTER — Other Ambulatory Visit: Payer: Self-pay | Admitting: Hematology and Oncology

## 2017-01-19 ENCOUNTER — Telehealth: Payer: Self-pay

## 2017-01-19 NOTE — Telephone Encounter (Signed)
Left message with patient's daughter to remind about SCP visit 01/20/17.  Asked her to call center with questions.

## 2017-01-20 ENCOUNTER — Encounter: Payer: Self-pay | Admitting: Adult Health

## 2017-01-22 ENCOUNTER — Ambulatory Visit (HOSPITAL_BASED_OUTPATIENT_CLINIC_OR_DEPARTMENT_OTHER): Payer: Self-pay | Admitting: Adult Health

## 2017-01-22 ENCOUNTER — Other Ambulatory Visit (HOSPITAL_BASED_OUTPATIENT_CLINIC_OR_DEPARTMENT_OTHER): Payer: Self-pay

## 2017-01-22 ENCOUNTER — Telehealth: Payer: Self-pay

## 2017-01-22 ENCOUNTER — Ambulatory Visit: Payer: Self-pay | Admitting: Hematology and Oncology

## 2017-01-22 ENCOUNTER — Encounter: Payer: Self-pay | Admitting: Adult Health

## 2017-01-22 VITALS — BP 142/81 | HR 58 | Temp 97.8°F | Resp 18 | Ht 60.0 in | Wt 217.8 lb

## 2017-01-22 DIAGNOSIS — C50011 Malignant neoplasm of nipple and areola, right female breast: Secondary | ICD-10-CM

## 2017-01-22 DIAGNOSIS — C50312 Malignant neoplasm of lower-inner quadrant of left female breast: Secondary | ICD-10-CM

## 2017-01-22 DIAGNOSIS — R918 Other nonspecific abnormal finding of lung field: Secondary | ICD-10-CM

## 2017-01-22 DIAGNOSIS — Z170 Estrogen receptor positive status [ER+]: Principal | ICD-10-CM

## 2017-01-22 DIAGNOSIS — C50012 Malignant neoplasm of nipple and areola, left female breast: Principal | ICD-10-CM

## 2017-01-22 DIAGNOSIS — C773 Secondary and unspecified malignant neoplasm of axilla and upper limb lymph nodes: Secondary | ICD-10-CM

## 2017-01-22 DIAGNOSIS — E2839 Other primary ovarian failure: Secondary | ICD-10-CM

## 2017-01-22 DIAGNOSIS — C50411 Malignant neoplasm of upper-outer quadrant of right female breast: Secondary | ICD-10-CM

## 2017-01-22 DIAGNOSIS — C50211 Malignant neoplasm of upper-inner quadrant of right female breast: Secondary | ICD-10-CM

## 2017-01-22 DIAGNOSIS — Z79811 Long term (current) use of aromatase inhibitors: Secondary | ICD-10-CM

## 2017-01-22 DIAGNOSIS — C50812 Malignant neoplasm of overlapping sites of left female breast: Secondary | ICD-10-CM

## 2017-01-22 DIAGNOSIS — Z95828 Presence of other vascular implants and grafts: Secondary | ICD-10-CM

## 2017-01-22 LAB — CBC WITH DIFFERENTIAL/PLATELET
BASO%: 0.3 % (ref 0.0–2.0)
BASOS ABS: 0 10*3/uL (ref 0.0–0.1)
EOS ABS: 0.1 10*3/uL (ref 0.0–0.5)
EOS%: 1.3 % (ref 0.0–7.0)
HCT: 37.9 % (ref 34.8–46.6)
HGB: 12.9 g/dL (ref 11.6–15.9)
LYMPH%: 29.8 % (ref 14.0–49.7)
MCH: 29.2 pg (ref 25.1–34.0)
MCHC: 34 g/dL (ref 31.5–36.0)
MCV: 85.7 fL (ref 79.5–101.0)
MONO#: 0.5 10*3/uL (ref 0.1–0.9)
MONO%: 6.6 % (ref 0.0–14.0)
NEUT%: 62 % (ref 38.4–76.8)
NEUTROS ABS: 4.4 10*3/uL (ref 1.5–6.5)
PLATELETS: 203 10*3/uL (ref 145–400)
RBC: 4.42 10*6/uL (ref 3.70–5.45)
RDW: 12.7 % (ref 11.2–14.5)
WBC: 7.1 10*3/uL (ref 3.9–10.3)
lymph#: 2.1 10*3/uL (ref 0.9–3.3)

## 2017-01-22 LAB — COMPREHENSIVE METABOLIC PANEL
ALT: 42 U/L (ref 0–55)
ANION GAP: 7 meq/L (ref 3–11)
AST: 37 U/L — ABNORMAL HIGH (ref 5–34)
Albumin: 3.9 g/dL (ref 3.5–5.0)
Alkaline Phosphatase: 72 U/L (ref 40–150)
BUN: 12.4 mg/dL (ref 7.0–26.0)
CHLORIDE: 105 meq/L (ref 98–109)
CO2: 27 meq/L (ref 22–29)
Calcium: 9.6 mg/dL (ref 8.4–10.4)
Creatinine: 0.7 mg/dL (ref 0.6–1.1)
GLUCOSE: 94 mg/dL (ref 70–140)
Potassium: 3.1 mEq/L — ABNORMAL LOW (ref 3.5–5.1)
SODIUM: 140 meq/L (ref 136–145)
Total Bilirubin: 0.82 mg/dL (ref 0.20–1.20)
Total Protein: 7.3 g/dL (ref 6.4–8.3)

## 2017-01-22 MED ORDER — SODIUM CHLORIDE 0.9% FLUSH
10.0000 mL | INTRAVENOUS | Status: DC | PRN
Start: 2017-01-22 — End: 2017-01-23
  Administered 2017-01-22: 10 mL via INTRAVENOUS
  Filled 2017-01-22: qty 10

## 2017-01-22 MED ORDER — HEPARIN SOD (PORK) LOCK FLUSH 100 UNIT/ML IV SOLN
500.0000 [IU] | Freq: Once | INTRAVENOUS | Status: AC | PRN
  Administered 2017-01-22: 500 [IU] via INTRAVENOUS
  Filled 2017-01-22: qty 5

## 2017-01-22 NOTE — Telephone Encounter (Signed)
Printed avs and calender per 9/27 sch messages and los

## 2017-01-22 NOTE — Patient Instructions (Signed)

## 2017-01-22 NOTE — Progress Notes (Signed)
CLINIC:  Survivorship   REASON FOR VISIT:  Routine follow-up post-treatment for a recent history of breast cancer.  BRIEF ONCOLOGIC HISTORY:    Bilateral breast cancer ([place])   08/16/2015 Mammogram    Left breast LIQ 4.2 cm, 4 mm oval mass 12:00, 1.2 cm mass left UOQ, enlarged left axillary lymph node      08/16/2015 Mammogram    Right breast UIQ: 3.9 cm irregular mass, 1.5 cm mass in the right LOQ, 9 mm mass right UOQ, 6 mm mass right LIQ, enlarged lymph node Rt axilla      08/20/2015 Initial Diagnosis    Right breast biopsy 1:00: IDC grade 2 ER 95%, PR 2%, Ki 67 15%; HER-2 negative ratio 1.04; 10:00: IDC grade 2-3 dissimilar to 1:00 biopsy 10:00: ER 95%, PR 0%, K67: 25%, HER-2 positive ratio 6.42 right axillary lymph node positive for metastatic cancer      08/20/2015 Procedure    Left biopsy 8:00: IDC with DCIS, grade 2; ER 95%, PR 95%, Ki-67 30%, HER-2 negative ratio 1.2; 12:00: IDC with DCIS, grade 1 ER 95%, PR 95%, Ki-67 10% HER-2 negative ratio 1.14 not similar to this biopsy, left axillary lymph node positive,       09/13/2015 - 01/17/2016 Neo-Adjuvant Chemotherapy    Neoadjuvant dose dense Adriamycin and Cytoxan followed by Taxol Herceptin Perjeta, followed by Herceptin maintenance      01/21/2016 Breast MRI    Bilateral breast masses significantly decreased in size with mild residual NME, complete resolution of numerous other nodules in both breasts, bilateral axillary lymph nodes and bilateral retropectoral lymph nodes decreased in size      02/11/2016 Surgery    Right mastectomy: IDC 2 nodules, 2.5, 1 cm, margins negative, 3/7 lymph nodes positive T2 N1a stage II B, ER 95%, PR 2%, HER-2 negative, Ki-67 15% RCB burden 3.1 and 2.9 class II      02/11/2016 Surgery    Left mastectomy: IDC 3.1 cm with DCIS, margins negative, 0/8 lymph nodes, T2 N0 stage II a, RCB burden 2.18, class II, ER 100%, PR 95%, HER-2 negative ratio 1.2, Ki-67 30%       04/07/2016 - 05/15/2016  Radiation Therapy    Adjuvant radiation      06/26/2016 -  Anti-estrogen oral therapy    Letrozole 2.5 mg daily      11/20/2016 Miscellaneous    Neratinib 240 mg daily       INTERVAL HISTORY:  Ms. Ornella Coderre presents to the [facility] today for our initial meeting to review her survivorship care plan detailing her treatment course for breast cancer, as well as monitoring long-term side effects of that treatment, education regarding health maintenance, screening, and overall wellness and health promotion.     Overall, Ms. Colby Catanese is spanish speaking only here with interpreter to review her SCP.  She is taking Neratinib and Letrozole and is tolerating both of these well.  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  She does have some difficulty closing her fourth and fifth digit.      REVIEW OF SYSTEMS:  Review of Systems  Constitutional: Negative for appetite change, chills, fatigue, fever and unexpected weight change.  HENT:   Negative for hearing loss and lump/mass.   Eyes: Negative for eye problems and icterus.  Respiratory: Negative for chest tightness, cough and shortness of breath.   Cardiovascular: Negative for chest pain, leg swelling and palpitations.  Gastrointestinal: Negative for abdominal distention and abdominal pain.  Endocrine: Negative for hot flashes.  Genitourinary: Negative for dyspareunia.   Musculoskeletal: Negative for arthralgias.  Skin: Negative for itching and rash.  Neurological: Positive for numbness. Negative for dizziness, extremity weakness and headaches.  Hematological: Negative for adenopathy. Does not bruise/bleed easily.  Psychiatric/Behavioral: Negative for depression. The patient is not nervous/anxious.   Breast: Denies any new nodularity, masses, tenderness, nipple changes, or nipple discharge.      ONCOLOGY TREATMENT TEAM:  1. Surgeon:  Dr. Donne Hazel at [facility] 2. Medical  Oncologist: Dr. Lindi Adie  3. Radiation Oncologist: Dr. Lisbeth Renshaw    PAST MEDICAL/SURGICAL HISTORY:  Past Medical History:  Diagnosis Date  . Cancer ([place])    breast  . GERD (gastroesophageal reflux disease)    Past Surgical History:  Procedure Laterality Date  . MASTECTOMY MODIFIED RADICAL Bilateral 02/11/2016  . MASTECTOMY MODIFIED RADICAL Bilateral 02/11/2016   Procedure: BILATERAL MASTECTOMY MODIFIED RADICAL;  Surgeon: Rolm Bookbinder, MD;  Location: [place];  Service: General;  Laterality: Bilateral;  . PORTACATH PLACEMENT N/A 09/04/2015   Procedure: INSERTION PORT-A-CATH WITH [place];  Surgeon: Rolm Bookbinder, MD;  Location: [place];  Service: General;  Laterality: N/A;  . TUBAL LIGATION       ALLERGIES:  Allergies  Allergen Reactions  . No Known Allergies      CURRENT MEDICATIONS:  Outpatient Encounter Prescriptions as of 01/22/2017  Medication Sig  . amLODipine (NORVASC) 5 MG tablet Take 1 tablet (5 mg total) by mouth daily.  . Calcium Carb-Cholecalciferol (CALCIUM 600+D) 600-800 MG-UNIT TABS Take 1 tablet by mouth daily.  . cetirizine (ZYRTEC) 10 MG tablet Take 1 tablet (10 mg total) by mouth daily.  . Cholecalciferol (VITAMIN D-3) 5000 units TABS Take 1 tablet by mouth daily.  . diphenoxylate-atropine (LOMOTIL) 2.5-0.025 MG tablet Take 1 tablet by mouth 4 (four) times daily as needed for diarrhea or loose stools.  . hydrochlorothiazide (HYDRODIURIL) 25 MG tablet Take 1 tablet (25 mg total) by mouth daily.  Marland Kitchen HYDROcodone-acetaminophen (NORCO/VICODIN) 5-325 MG tablet Take 1-2 tablets by mouth every 6 (six) hours as needed.  Marland Kitchen letrozole (FEMARA) 2.5 MG tablet Take 1 tablet (2.5 mg total) by mouth daily.  Marland Kitchen lidocaine-prilocaine (EMLA) cream Apply 1 application topically as needed (Apply 2- hours prior to access of port).  Marland Kitchen loperamide (IMODIUM A-D) 2 MG tablet Take 1 tablet (2 mg total) by mouth 4 (four) times daily as needed for diarrhea or loose stools.  .  NERLYNX 40 MG tablet TAKE SIX TABLETS BY MOUTH DAILY   . potassium chloride (K-DUR,KLOR-CON) 10 MEQ tablet Take 2 tablets (20 mEq total) by mouth 2 (two) times daily.  Marland Kitchen lisinopril (PRINIVIL,ZESTRIL) 10 MG tablet lisinopril 10 mg tablet  Take 1 tablet every day by oral route.  . [DISCONTINUED] ondansetron (ZOFRAN ODT) 4 MG disintegrating tablet Take 1 tablet (4 mg total) by mouth every 8 (eight) hours as needed for nausea or vomiting. (Patient not taking: Reported on 01/22/2017)   Facility-Administered Encounter Medications as of 01/22/2017  Medication  . [COMPLETED] heparin lock flush 100 unit/mL  . sodium chloride flush (NS) 0.9 % injection 10 mL     ONCOLOGIC FAMILY HISTORY:  Family History  Problem Relation Age of Onset  . Diabetes Mother      GENETIC COUNSELING/TESTING: Not at this time  SOCIAL HISTORY:  Radonna Bracher is married and lives with her husband, and her daughter, son in Sports coach, and two grandchildren in [place], [place].  She denies any current  or history of tobacco, alcohol, or illicit drug use.     PHYSICAL EXAMINATION:  Vital Signs:   Vitals:   01/22/17 1041  BP: (!) 142/81  Pulse: (!) 58  Resp: 18  Temp: 97.8 F (36.6 C)  SpO2: 99%   Filed Weights   01/22/17 1041  Weight: 217 lb 12.8 oz (98.8 kg)   General: Well-nourished, well-appearing female in no acute distress.  She is accompanied in clinic by her daughter today.   HEENT: Head is normocephalic.  Pupils equal and reactive to light. Conjunctivae clear without exudate.  Sclerae anicteric. Oral mucosa is pink, moist.  Oropharynx is pink without lesions or erythema.  Lymph: No cervical, supraclavicular, or infraclavicular lymphadenopathy noted on palpation.  Cardiovascular: Regular rate and rhythm.Marland Kitchen Respiratory: Clear to auscultation bilaterally. Chest expansion symmetric; breathing non-labored.  Breasts: s/p bilateral mastectomies, no nodularity noted, no swelling, no  tenderness, no sign of recurrence bilaterally GI: Abdomen soft and round; non-tender, non-distended. Bowel sounds normoactive.  GU: Deferred.  Neuro: No focal deficits. Steady gait.  Psych: Mood and affect normal and appropriate for situation.  Extremities: No edema. MSK: No focal spinal tenderness to palpation.  Full range of motion in bilateral upper extremities Skin: Warm and dry.  LABORATORY DATA:  None for this visit.  DIAGNOSTIC IMAGING:  None for this visit.      ASSESSMENT AND PLAN:  Ms.. Brynleigh Sequeira is a pleasant 56 y.o. 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  She presents to the Survivorship Clinic for our initial meeting and routine follow-up post-completion of treatment for breast cancer.    1. Stage II bilateral breast cancer:  Ms. Emonie Espericueta is continuing to recover from definitive treatment for breast cancer. She will follow-up with her medical oncologist, Dr. Lindi Adie in 03/2017 with history and physical exam per surveillance protocol.  She will continue her anti-estrogen therapy with Letrozole. Thus far, she is tolerating the Letrozole well, with minimal side effects.  She will also continue taking the Neratinib and is tolerating this well.  Today, a comprehensive survivorship care plan and treatment summary was reviewed with the patient today detailing her breast cancer diagnosis, treatment course, potential late/long-term effects of treatment, appropriate follow-up care with recommendations for the future, and patient education resources.  A copy of this summary, along with a letter will be sent to the patient's primary care provider  via mail/fax/In Basket message after today's visit.    2. Bone health:  Given Ms. Travis Purk age/history of breast cancer and her current treatment regimen including anti-estrogen therapy with Letrozole, she is at risk for bone demineralization.  She has not yet had a bone density test done, and I have ordered this for her today.  In the meantime, she was encouraged to increase her consumption of foods rich in calcium, as well as increase her weight-bearing activities.  She was given education on specific activities to promote bone health.  3. Cancer screening:  Due to Ms. Kinslee Dalpe history and her age, she should receive screening for skin cancers, colon cancer, and gynecologic cancers.  The information and recommendations are listed on the patient's comprehensive care plan/treatment summary and were reviewed in detail with the patient.    4. Health maintenance and wellness  promotion: Ms. Maralyn Witherell was encouraged to consume 5-7 servings of fruits and vegetables per day. We reviewed the "Nutrition Rainbow" handout, as well as the handout "Take Control of Your Health and Reduce Your Cancer Risk" from the [place].  She was also encouraged to engage in moderate to vigorous exercise for 30 minutes per day most days of the week. We discussed the [employer] fitness program, which is designed for cancer survivors to help them become more physically fit after cancer treatments.  She was instructed to limit her alcohol consumption and continue to abstain from tobacco use.     5. Support services/counseling: It is not uncommon for this period of the patient's cancer care trajectory to be one of many emotions and stressors.  We discussed an opportunity for her to participate in the next session of [facility] ("Finding Your New Normal") support group series designed for patients after they have completed treatment.   Ms. Lakita Sahlin was encouraged to take advantage of our  many other support services programs, support groups, and/or counseling in coping with her new life as a cancer survivor after completing anti-cancer treatment.  She was offered support today through active listening and expressive supportive counseling.  She was given information regarding our available services and encouraged to contact me with any questions or for help enrolling in any of our support group/programs.    Dispo:   -Return to cancer center 03/2017 for follow up with Dr. Lindi Adie  -Bone density due -Follow up with Dr. Donne Hazel between March and June, 2019.   -She is welcome to return back to the Survivorship Clinic at any time; no additional follow-up needed at this time.  -Consider referral back to survivorship as a long-term survivor for continued surveillance  A total of (50) minutes of face-to-face time was spent with this patient with greater than 50% of that time in counseling and care-coordination.   Gardenia Phlegm, [place] [phone]   Note: PRIMARY CARE PROVIDER Arnoldo Morale, [place] [phone]

## 2017-01-23 ENCOUNTER — Ambulatory Visit: Payer: Self-pay | Admitting: Family Medicine

## 2017-02-12 ENCOUNTER — Other Ambulatory Visit: Payer: Self-pay | Admitting: Hematology and Oncology

## 2017-02-13 ENCOUNTER — Other Ambulatory Visit: Payer: Self-pay

## 2017-02-13 ENCOUNTER — Telehealth: Payer: Self-pay | Admitting: Pharmacist

## 2017-02-13 DIAGNOSIS — Z170 Estrogen receptor positive status [ER+]: Principal | ICD-10-CM

## 2017-02-13 DIAGNOSIS — C50012 Malignant neoplasm of nipple and areola, left female breast: Principal | ICD-10-CM

## 2017-02-13 DIAGNOSIS — C50011 Malignant neoplasm of nipple and areola, right female breast: Secondary | ICD-10-CM

## 2017-02-13 MED ORDER — NERLYNX 40 MG PO TABS: ORAL_TABLET | ORAL | 11 refills | Status: DC

## 2017-02-13 MED ORDER — NERLYNX 40 MG PO TABS: ORAL_TABLET | ORAL | 0 refills | Status: DC

## 2017-02-13 NOTE — Telephone Encounter (Signed)
Printed script for pharmacist to send to company that provides this for patient.

## 2017-02-13 NOTE — Telephone Encounter (Signed)
Oral Oncology Pharmacist Encounter  Received notification from [facility] that they had received Nerlynx prescription for patient and are unable to fill this medication.  Noted patient receives Nerlynx through manufacturer assistance program. Printed prescription obtained from provider.  I called [employer] at [phone] to follow-up on prescription status, was informed pharmacy needed prescription clarification.  I called [facility] at [phone] to provide prescription clarification.  Prescription was out of refills. I year of refills authorized per discussion with MD.  No other needs from office at this time.  Oral Oncology Clinic will continue to follow.  Johny Drilling, PharmD, BCPS, BCOP 02/13/2017 3:35 PM Oral Oncology Clinic [phone]

## 2017-02-13 NOTE — Telephone Encounter (Signed)
Refilled today.  Cyndia Bent RN

## 2017-02-27 ENCOUNTER — Encounter (HOSPITAL_COMMUNITY): Payer: Self-pay

## 2017-02-27 ENCOUNTER — Ambulatory Visit (HOSPITAL_COMMUNITY)
Admission: RE | Admit: 2017-02-27 | Discharge: 2017-02-27 | Disposition: A | Discharge status: Home / Self Care | Payer: Self-pay | Source: Ambulatory Visit | Attending: Adult Health | Admitting: Adult Health

## 2017-02-27 DIAGNOSIS — R918 Other nonspecific abnormal finding of lung field: Secondary | ICD-10-CM | POA: Insufficient documentation

## 2017-02-27 MED ORDER — HEPARIN SOD (PORK) LOCK FLUSH 100 UNIT/ML IV SOLN
INTRAVENOUS | Status: AC
  Filled 2017-02-27: qty 5

## 2017-02-27 MED ORDER — HEPARIN SOD (PORK) LOCK FLUSH 100 UNIT/ML IV SOLN
500.0000 [IU] | Freq: Once | INTRAVENOUS | Status: AC
  Administered 2017-02-27: 500 [IU] via INTRAVENOUS

## 2017-02-27 MED ORDER — IOPAMIDOL (ISOVUE-300) INJECTION 61%
INTRAVENOUS | Status: AC
  Filled 2017-02-27: qty 75

## 2017-02-27 MED ORDER — IOPAMIDOL (ISOVUE-300) INJECTION 61%
75.0000 mL | Freq: Once | INTRAVENOUS | Status: AC | PRN
  Administered 2017-02-27: 75 mL via INTRAVENOUS

## 2017-03-02 ENCOUNTER — Other Ambulatory Visit: Payer: Self-pay | Admitting: Adult Health

## 2017-03-02 DIAGNOSIS — R911 Solitary pulmonary nodule: Secondary | ICD-10-CM

## 2017-03-05 ENCOUNTER — Ambulatory Visit (HOSPITAL_BASED_OUTPATIENT_CLINIC_OR_DEPARTMENT_OTHER): Payer: Self-pay

## 2017-03-05 VITALS — BP 129/74 | HR 60 | Temp 97.8°F | Resp 18

## 2017-03-05 DIAGNOSIS — Z95828 Presence of other vascular implants and grafts: Secondary | ICD-10-CM

## 2017-03-05 DIAGNOSIS — C50211 Malignant neoplasm of upper-inner quadrant of right female breast: Secondary | ICD-10-CM

## 2017-03-05 DIAGNOSIS — C50312 Malignant neoplasm of lower-inner quadrant of left female breast: Secondary | ICD-10-CM

## 2017-03-05 DIAGNOSIS — C773 Secondary and unspecified malignant neoplasm of axilla and upper limb lymph nodes: Secondary | ICD-10-CM

## 2017-03-05 DIAGNOSIS — C50411 Malignant neoplasm of upper-outer quadrant of right female breast: Secondary | ICD-10-CM

## 2017-03-05 DIAGNOSIS — Z452 Encounter for adjustment and management of vascular access device: Secondary | ICD-10-CM

## 2017-03-05 DIAGNOSIS — C50812 Malignant neoplasm of overlapping sites of left female breast: Secondary | ICD-10-CM

## 2017-03-05 MED ORDER — SODIUM CHLORIDE 0.9% FLUSH
10.0000 mL | INTRAVENOUS | Status: DC | PRN
  Administered 2017-03-05: 10 mL via INTRAVENOUS
  Filled 2017-03-05: qty 10

## 2017-03-05 MED ORDER — HEPARIN SOD (PORK) LOCK FLUSH 100 UNIT/ML IV SOLN
500.0000 [IU] | Freq: Once | INTRAVENOUS | Status: AC | PRN
  Administered 2017-03-05: 500 [IU] via INTRAVENOUS
  Filled 2017-03-05: qty 5

## 2017-03-16 ENCOUNTER — Ambulatory Visit: Payer: Self-pay | Attending: Family Medicine | Admitting: Family Medicine

## 2017-03-16 ENCOUNTER — Encounter: Payer: Self-pay | Admitting: Family Medicine

## 2017-03-16 VITALS — BP 120/75 | HR 57 | Temp 97.7°F | Ht 60.0 in | Wt 215.0 lb

## 2017-03-16 DIAGNOSIS — C50011 Malignant neoplasm of nipple and areola, right female breast: Secondary | ICD-10-CM | POA: Insufficient documentation

## 2017-03-16 DIAGNOSIS — C50012 Malignant neoplasm of nipple and areola, left female breast: Secondary | ICD-10-CM | POA: Insufficient documentation

## 2017-03-16 DIAGNOSIS — K2100 Gastro-esophageal reflux disease with esophagitis, without bleeding: Secondary | ICD-10-CM | POA: Insufficient documentation

## 2017-03-16 DIAGNOSIS — Z79811 Long term (current) use of aromatase inhibitors: Secondary | ICD-10-CM | POA: Insufficient documentation

## 2017-03-16 DIAGNOSIS — Z79899 Other long term (current) drug therapy: Secondary | ICD-10-CM | POA: Insufficient documentation

## 2017-03-16 DIAGNOSIS — Z923 Personal history of irradiation: Secondary | ICD-10-CM | POA: Insufficient documentation

## 2017-03-16 DIAGNOSIS — E876 Hypokalemia: Secondary | ICD-10-CM | POA: Insufficient documentation

## 2017-03-16 DIAGNOSIS — Z9221 Personal history of antineoplastic chemotherapy: Secondary | ICD-10-CM | POA: Insufficient documentation

## 2017-03-16 DIAGNOSIS — I10 Essential (primary) hypertension: Secondary | ICD-10-CM | POA: Insufficient documentation

## 2017-03-16 DIAGNOSIS — Z9013 Acquired absence of bilateral breasts and nipples: Secondary | ICD-10-CM | POA: Insufficient documentation

## 2017-03-16 DIAGNOSIS — Z170 Estrogen receptor positive status [ER+]: Secondary | ICD-10-CM | POA: Insufficient documentation

## 2017-03-16 MED ORDER — POTASSIUM CHLORIDE CRYS ER 10 MEQ PO TBCR: 20.0000 meq | EXTENDED_RELEASE_TABLET | Freq: Once | ORAL | 3 refills | Status: DC

## 2017-03-16 MED ORDER — AMLODIPINE BESYLATE 5 MG PO TABS: 5.0000 mg | ORAL_TABLET | Freq: Every day | ORAL | 5 refills | Status: DC

## 2017-03-16 MED ORDER — LISINOPRIL 5 MG PO TABS: 5.0000 mg | ORAL_TABLET | Freq: Every day | ORAL | 5 refills | Status: DC

## 2017-03-16 NOTE — Patient Instructions (Signed)
Acidez estomacal (Heartburn) La acidez estomacal es un tipo de dolor o de molestia que se puede presentar en la garganta o en el pecho. A menudo se la describe como Designer, multimedia. Tambin puede producir mal aliento. La sensacin de acidez puede empeorar al [employer] o inclinarse, y suele ser ms intensa durante la noche. La acidez puede deberse al retroceso de los contenidos estomacales hacia el esfago (reflujo). [place] estas medidas para aliviar las molestias y [place] complicaciones. Dieta  Siga la dieta que le haya recomendado el mdico, la cual puede incluir evitar alimentos y bebidas tales como: ? Caf y t (con o sin cafena). ? Bebidas que contengan alcohol. ? Bebidas energizantes y deportivas. ? Gaseosas o refrescos. ? Chocolate y cacao. ? Menta y [place]. ? Ajo y cebollas. ? Rbano picante. ? Alimentos muy condimentados y cidos, entre ellos, pimientos, [place] en polvo, curry en polvo, vinagre, salsas picantes y salsa barbacoa. ? Frutas ctricas y sus jugos, como naranjas, limones y limas. ? Alimentos a base de tomates, como salsa roja, [place], salsa y pizza con salsa roja. ? Alimentos fritos y Radio broadcast assistant, como rosquillas, papas fritas y aderezos con alto contenido de Lobbyist. ? Carnes con alto contenido de [place], como hot dogs y cortes grasos de carnes rojas y blancas, por ejemplo, filetes de entrecot, salchicha, jamn y tocino. ? Productos lcteos con alto contenido de grasa, como [place], [place] y [place] crema.  Haga comidas pequeas y frecuentes Medical sales representative de comidas abundantes.  Evite beber [place] comidas.  No coma durante las 2 o 3horas previas a la hora de [place].  No se acueste inmediatamente despus de comer.  No haga actividad fsica enseguida despus de comer. Instrucciones generales  Est atento a cualquier cambio en los sntomas.  Tome los medicamentos de venta libre y los  recetados solamente como se lo haya indicado el mdico. No tome aspirina, ibuprofeno ni otros antiinflamatorios no esteroides (AINE), a menos que se lo haya indicado el mdico.  No consuma ningn producto que contenga tabaco, lo que incluye cigarrillos, tabaco de Higher education careers adviser y Psychologist, sport and exercise. Si necesita ayuda para dejar de fumar, consulte al mdico.  Use ropas sueltas. No use prendas ajustadas alrededor de la cintura que ejerzan presin en el abdomen.  Levante (eleve) unas 6pulgadas (15centmetros) la cabecera de la cama.  Trate de reducir [employer] de estrs con actividades como el yoga o la meditacin. Si necesita ayuda para reducir [employer] de estrs, consulte al mdico.  Si tiene sobrepeso, Multimedia programmer un peso saludable. Hable con el mdico acerca de su peso ideal y pdale asesoramiento en cuanto a la dieta que debe seguir para Therapist, music.  Concurra a todas las visitas de control como se lo haya indicado el mdico. Esto es importante. SOLICITE ATENCIN MDICA SI:  Aparecen nuevos sntomas.  Baja de peso sin causa aparente.  Tiene dificultad para tragar o siente dolor al [employer].  Tiene sibilancias o tos persistente.  Los sntomas no mejoran con Dispensing optician.  Tiene acidez frecuente durante ms de [employer]V.  [place] [employer] SI:  Tiene dolor en los brazos, el cuello, los [place], la dentadura o la espalda.  Philbert Riser, se marea o tiene sensacin de desvanecimiento.  Siente falta de aire o Tourist information centre manager.  Vomita y el vmito es parecido a la sangre o a los granos de caf.  Las [employer]  son sanguinolentas o de color negro.  Esta informacin no tiene Marine scientist el consejo del mdico. Asegrese de hacerle al mdico cualquier pregunta que tenga. Document Released: 12/25/2010 Document Revised: 01/03/2015 Document Reviewed: 08/09/2014 Elsevier Interactive Patient Education  2017 [employer].

## 2017-03-16 NOTE — Progress Notes (Signed)
Subjective:  Patient ID: Felicia [name] Age, female    DOB: 03-17-1961  Age: 56 y.o. MRN: [id number]  CC: Hypertension   HPI Felicia [name] is 56 year old female with a history of GERD, bilateral breast cancer diagnosed in 07/2015 (status post bilateral mastectomy, chemotherapy and radiation), status post bilateral lymph node excision now on Neratinib and Letrozole who comes into the clinic accompanied by her daughter to follow up on Hypertension  Endorses compliance with her antihypertensives and her blood pressure is normal.  She has also had to remain on potassium due to hypokalemia. Denies lightheadedness.  She complains of reflux which has worsened ever since the number of medication she takes increased.  She denies abdominal pain, nausea or vomiting.  Last visit to oncology was in 11/2016.  Reports appetite is controlled and she is feeling well overall.  Past Medical History:  Diagnosis Date  . Cancer ([place])    breast  . GERD (gastroesophageal reflux disease)     Past Surgical History:  Procedure Laterality Date  . BILATERAL MASTECTOMY MODIFIED RADICAL Bilateral 02/11/2016   Performed by Rolm Bookbinder, MD at [facility]  . INSERTION PORT-A-CATH WITH [place] N/A 09/04/2015   Performed by Rolm Bookbinder, MD at [facility]  . MASTECTOMY MODIFIED RADICAL Bilateral 02/11/2016  . TUBAL LIGATION      Allergies  Allergen Reactions  . No Known Allergies      Outpatient Medications Prior to Visit  Medication Sig Dispense Refill  . Calcium Carb-Cholecalciferol (CALCIUM 600+D) 600-800 MG-UNIT TABS Take 1 tablet by mouth daily.    . Cholecalciferol (VITAMIN D-3) 5000 units TABS Take 1 tablet by mouth daily.    . diphenoxylate-atropine (LOMOTIL) 2.5-0.025 MG tablet Take 1 tablet by mouth 4 (four) times daily as needed for diarrhea or loose stools. 60 tablet 0  . letrozole (FEMARA) 2.5 MG tablet Take 1 tablet (2.5 mg total) by mouth daily. 90  tablet 3  . lidocaine-prilocaine (EMLA) cream Apply 1 application topically as needed (Apply 2- hours prior to access of port). 30 g 1  . loperamide (IMODIUM A-D) 2 MG tablet Take 1 tablet (2 mg total) by mouth 4 (four) times daily as needed for diarrhea or loose stools. 30 tablet 0  . NERLYNX 40 MG tablet Take 6 tablets (240mg ) by mouth once daily with food. 180 tablet 11  . amLODipine (NORVASC) 5 MG tablet Take 1 tablet (5 mg total) by mouth daily. 30 tablet 5  . hydrochlorothiazide (HYDRODIURIL) 25 MG tablet Take 1 tablet (25 mg total) by mouth daily. 30 tablet 3  . lisinopril (PRINIVIL,ZESTRIL) 10 MG tablet lisinopril 10 mg tablet  Take 1 tablet every day by oral route.    . potassium chloride (K-DUR,KLOR-CON) 10 MEQ tablet Take 2 tablets (20 mEq total) by mouth 2 (two) times daily. 120 tablet 3  . cetirizine (ZYRTEC) 10 MG tablet Take 1 tablet (10 mg total) by mouth daily. (Patient not taking: Reported on 03/16/2017) 30 tablet 2  . HYDROcodone-acetaminophen (NORCO/VICODIN) 5-325 MG tablet Take 1-2 tablets by mouth every 6 (six) hours as needed. (Patient not taking: Reported on 03/16/2017) 6 tablet 0   No facility-administered medications prior to visit.     ROS Review of Systems  Constitutional: Negative for activity change, appetite change and fatigue.  HENT: Negative for congestion, sinus pressure and sore throat.   Eyes: Negative for visual disturbance.  Respiratory: Negative for cough, chest tightness, shortness of breath and wheezing.   Cardiovascular:  Negative for chest pain and palpitations.  Gastrointestinal: Negative for abdominal distention, abdominal pain and constipation.  Endocrine: Negative for polydipsia.  Genitourinary: Negative for dysuria and frequency.  Musculoskeletal: Negative for arthralgias and back pain.  Skin: Negative for rash.  Neurological: Negative for tremors, light-headedness and numbness.  Hematological: Does not bruise/bleed easily.    Psychiatric/Behavioral: Negative for agitation and behavioral problems.    Objective:  BP 120/75   Pulse (!) 57   Temp 97.7 F (36.5 C) (Oral)   Ht 5' (1.524 m)   Wt 215 lb (97.5 kg)   SpO2 96%   BMI 41.99 kg/m   BP/Weight 03/16/2017 03/05/2017 0/17/5102  Systolic BP 585 277 824  Diastolic BP 75 74 81  Wt. (Lbs) 215 - 217.8  BMI 41.99 - 42.54      Physical Exam  Constitutional: She is oriented to person, place, and time. She appears well-developed and well-nourished.  Cardiovascular: Normal rate, normal heart sounds and intact distal pulses.  No murmur heard. Pulmonary/Chest: Effort normal and breath sounds normal. She has no wheezes. She has no rales. She exhibits no tenderness.  Abdominal: Soft. Bowel sounds are normal. She exhibits no distension and no mass. There is no tenderness.  Musculoskeletal: Normal range of motion.  Neurological: She is alert and oriented to person, place, and time.  Skin: Skin is warm and dry.  Psychiatric: She has a normal mood and affect.    CMP Latest Ref Rng & Units 01/22/2017 12/18/2016 12/05/2016  Glucose 70 - 140 mg/dl 94 94 137  BUN 7.0 - 26.0 mg/dL 12.4 12.9 13.5  Creatinine 0.6 - 1.1 mg/dL 0.7 0.7 0.7  Sodium 136 - 145 mEq/L 140 138 138  Potassium 3.5 - 5.1 mEq/L 3.1(L) 3.1(L) 3.2(L)  Chloride 101 - 111 mmol/L - - -  CO2 22 - 29 mEq/L 27 26 28   Calcium 8.4 - 10.4 mg/dL 9.6 9.4 10.1  Total Protein 6.4 - 8.3 g/dL 7.3 7.2 7.3  Total Bilirubin 0.20 - 1.20 mg/dL 0.82 0.74 0.63  Alkaline Phos 40 - 150 U/L 72 77 93  AST 5 - 34 U/L 37(H) 36(H) 31  ALT 0 - 55 U/L 42 44 40     Assessment & Plan:   1. Essential hypertension Controlled Switched from hydrochlorothiazide to lisinopril as the former will contribute to hypokalemia - amLODipine (NORVASC) 5 MG tablet; Take 1 tablet (5 mg total) daily by mouth.  Dispense: 30 tablet; Refill: 5  2. Hypokalemia Last potassium was 3.1 in 9/27/ 2018 Likely from diarrhea secondary to her  Neratinib Continue potassium Labs will be drawn at the cancer center - potassium chloride (K-DUR,KLOR-CON) 10 MEQ tablet; Take 2 tablets (20 mEq total) once for 1 dose by mouth.  Dispense: 60 tablet; Refill: 3  3. GERD with esophagitis Would love to place on PPI but this interacts with her cancer medications Patient advised to discuss this with her oncologist.  4. 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 amLODipine (NORVASC) 5 MG tablet    Sig: Take 1 tablet (5 mg total) daily by mouth.    Dispense:  30 tablet    Refill:  5    Discontinue previous dose  . lisinopril (PRINIVIL,ZESTRIL) 5 MG tablet    Sig: Take 1 tablet (5 mg total) daily by mouth.    Dispense:  30 tablet    Refill:  5    Discontinue HCTZ  . potassium chloride (K-DUR,KLOR-CON) 10 MEQ tablet    Sig: Take 2 tablets (20 mEq total) once for 1 dose by mouth.    Dispense:  60 tablet    Refill:  3    Follow-up: 3 months follow up of HTN and GERD   Arnoldo Morale MD

## 2017-03-31 ENCOUNTER — Telehealth: Payer: Self-pay | Admitting: Hematology and Oncology

## 2017-03-31 NOTE — Telephone Encounter (Signed)
Left message for patient using Felicia [name] #[id number] re appt moved from 12/20 to 12/18. Schedule mailed

## 2017-04-09 ENCOUNTER — Ambulatory Visit (HOSPITAL_COMMUNITY)
Admission: RE | Admit: 2017-04-09 | Discharge: 2017-04-09 | Disposition: A | Discharge status: Home / Self Care | Payer: Self-pay | Source: Ambulatory Visit | Attending: Adult Health | Admitting: Adult Health

## 2017-04-09 DIAGNOSIS — R911 Solitary pulmonary nodule: Secondary | ICD-10-CM | POA: Insufficient documentation

## 2017-04-09 DIAGNOSIS — I517 Cardiomegaly: Secondary | ICD-10-CM | POA: Insufficient documentation

## 2017-04-09 DIAGNOSIS — N2889 Other specified disorders of kidney and ureter: Secondary | ICD-10-CM | POA: Insufficient documentation

## 2017-04-09 DIAGNOSIS — R918 Other nonspecific abnormal finding of lung field: Secondary | ICD-10-CM | POA: Insufficient documentation

## 2017-04-09 DIAGNOSIS — Z79899 Other long term (current) drug therapy: Secondary | ICD-10-CM | POA: Insufficient documentation

## 2017-04-09 DIAGNOSIS — J320 Chronic maxillary sinusitis: Secondary | ICD-10-CM | POA: Insufficient documentation

## 2017-04-09 LAB — GLUCOSE, CAPILLARY: GLUCOSE-CAPILLARY: 114 mg/dL — AB (ref 65–99)

## 2017-04-09 MED ORDER — FLUDEOXYGLUCOSE F - 18 (FDG) INJECTION
10.7000 | Freq: Once | INTRAVENOUS | Status: AC | PRN
  Administered 2017-04-09: 10.7 via INTRAVENOUS

## 2017-04-10 ENCOUNTER — Other Ambulatory Visit: Payer: Self-pay | Admitting: Adult Health

## 2017-04-10 ENCOUNTER — Other Ambulatory Visit: Payer: Self-pay | Admitting: Hematology and Oncology

## 2017-04-10 DIAGNOSIS — R911 Solitary pulmonary nodule: Secondary | ICD-10-CM

## 2017-04-13 NOTE — Assessment & Plan Note (Signed)
Treatment Summary: 1. Neoadjuvant chemotherapy with either AC followed by THP started 09/13/2015 and completed 12 weeks of Taxol 11/29/2015, Herceptin Perjeta 6; now on Herceptin  2. Bilateral mastectomies and lymph node surgery 02/11/16 3. Adjuvant radiation 04/07/2016 to 05/15/2016 4. Adjuvant antiestrogen therapy with Letrozole started 06/26/2016 5. 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 Herceptin maintenance completed July 2018  Current treatment:  1. Adjuvant antiestrogen therapy with letrozole 2.5 mg daily started 06/26/2016 2. Neratinib 240 mg daily started 11/20/2016  Neratinib toxicities: Diarrhea:  Imodium 3 times a day.and Lomotil. If necessary we can reduce the dosage of Neratinib.  Hypokalemia: On 4 tablets of potassium daily. Encouraged her to continue with the same since a potassium 3.2 today. Return to clinic in 4 weeks for follow-up

## 2017-04-14 ENCOUNTER — Ambulatory Visit (HOSPITAL_BASED_OUTPATIENT_CLINIC_OR_DEPARTMENT_OTHER): Payer: Self-pay

## 2017-04-14 ENCOUNTER — Ambulatory Visit (HOSPITAL_BASED_OUTPATIENT_CLINIC_OR_DEPARTMENT_OTHER): Payer: Self-pay | Admitting: Hematology and Oncology

## 2017-04-14 ENCOUNTER — Telehealth: Payer: Self-pay | Admitting: Hematology and Oncology

## 2017-04-14 DIAGNOSIS — C50011 Malignant neoplasm of nipple and areola, right female breast: Secondary | ICD-10-CM

## 2017-04-14 DIAGNOSIS — Z170 Estrogen receptor positive status [ER+]: Principal | ICD-10-CM

## 2017-04-14 DIAGNOSIS — C773 Secondary and unspecified malignant neoplasm of axilla and upper limb lymph nodes: Secondary | ICD-10-CM

## 2017-04-14 DIAGNOSIS — E876 Hypokalemia: Secondary | ICD-10-CM

## 2017-04-14 DIAGNOSIS — R918 Other nonspecific abnormal finding of lung field: Secondary | ICD-10-CM

## 2017-04-14 DIAGNOSIS — C50812 Malignant neoplasm of overlapping sites of left female breast: Secondary | ICD-10-CM

## 2017-04-14 DIAGNOSIS — C50211 Malignant neoplasm of upper-inner quadrant of right female breast: Secondary | ICD-10-CM

## 2017-04-14 DIAGNOSIS — C50012 Malignant neoplasm of nipple and areola, left female breast: Principal | ICD-10-CM

## 2017-04-14 DIAGNOSIS — Z9013 Acquired absence of bilateral breasts and nipples: Secondary | ICD-10-CM

## 2017-04-14 DIAGNOSIS — G620 Drug-induced polyneuropathy: Secondary | ICD-10-CM

## 2017-04-14 DIAGNOSIS — Z79811 Long term (current) use of aromatase inhibitors: Secondary | ICD-10-CM

## 2017-04-14 DIAGNOSIS — Z95828 Presence of other vascular implants and grafts: Secondary | ICD-10-CM

## 2017-04-14 DIAGNOSIS — C50312 Malignant neoplasm of lower-inner quadrant of left female breast: Secondary | ICD-10-CM

## 2017-04-14 DIAGNOSIS — C50411 Malignant neoplasm of upper-outer quadrant of right female breast: Secondary | ICD-10-CM

## 2017-04-14 LAB — COMPREHENSIVE METABOLIC PANEL
ALBUMIN: 4.2 g/dL (ref 3.5–5.0)
ALK PHOS: 74 U/L (ref 40–150)
ALT: 31 U/L (ref 0–55)
ANION GAP: 9 meq/L (ref 3–11)
AST: 29 U/L (ref 5–34)
BILIRUBIN TOTAL: 0.94 mg/dL (ref 0.20–1.20)
BUN: 12.5 mg/dL (ref 7.0–26.0)
CO2: 23 mEq/L (ref 22–29)
Calcium: 9.5 mg/dL (ref 8.4–10.4)
Chloride: 106 mEq/L (ref 98–109)
Creatinine: 0.7 mg/dL (ref 0.6–1.1)
GLUCOSE: 100 mg/dL (ref 70–140)
POTASSIUM: 3.7 meq/L (ref 3.5–5.1)
SODIUM: 137 meq/L (ref 136–145)
Total Protein: 7.8 g/dL (ref 6.4–8.3)

## 2017-04-14 LAB — CBC WITH DIFFERENTIAL/PLATELET
BASO%: 0.5 % (ref 0.0–2.0)
BASOS ABS: 0 10*3/uL (ref 0.0–0.1)
EOS%: 1.1 % (ref 0.0–7.0)
Eosinophils Absolute: 0.1 10*3/uL (ref 0.0–0.5)
HCT: 41 % (ref 34.8–46.6)
HGB: 13.8 g/dL (ref 11.6–15.9)
LYMPH#: 1.7 10*3/uL (ref 0.9–3.3)
LYMPH%: 24.6 % (ref 14.0–49.7)
MCH: 29.1 pg (ref 25.1–34.0)
MCHC: 33.7 g/dL (ref 31.5–36.0)
MCV: 86.6 fL (ref 79.5–101.0)
MONO#: 0.5 10*3/uL (ref 0.1–0.9)
MONO%: 7.3 % (ref 0.0–14.0)
NEUT#: 4.5 10*3/uL (ref 1.5–6.5)
NEUT%: 66.5 % (ref 38.4–76.8)
Platelets: 211 10*3/uL (ref 145–400)
RBC: 4.73 10*6/uL (ref 3.70–5.45)
RDW: 13.2 % (ref 11.2–14.5)
WBC: 6.7 10*3/uL (ref 3.9–10.3)

## 2017-04-14 MED ORDER — SODIUM CHLORIDE 0.9% FLUSH
10.0000 mL | INTRAVENOUS | Status: AC | PRN
  Administered 2017-04-14: 10 mL via INTRAVENOUS
  Filled 2017-04-14: qty 10

## 2017-04-14 MED ORDER — HEPARIN SOD (PORK) LOCK FLUSH 100 UNIT/ML IV SOLN
500.0000 [IU] | Freq: Once | INTRAVENOUS | Status: AC | PRN
  Administered 2017-04-14: 500 [IU] via INTRAVENOUS
  Filled 2017-04-14: qty 5

## 2017-04-14 MED ORDER — LETROZOLE 2.5 MG PO TABS: 2.5000 mg | ORAL_TABLET | Freq: Every day | ORAL | 3 refills | Status: DC

## 2017-04-14 NOTE — Patient Instructions (Signed)
Implanted Port Home Guide An implanted port is a type of central line that is placed under the skin. Central lines are used to provide IV access when treatment or nutrition needs to be given through a person's veins. Implanted ports are used for long-term IV access. An implanted port may be placed because:  You need IV medicine that would be irritating to the small veins in your hands or arms.  You need long-term IV medicines, such as antibiotics.  You need IV nutrition for a long period.  You need frequent blood draws for lab tests.  You need dialysis.  Implanted ports are usually placed in the chest area, but they can also be placed in the upper arm, the abdomen, or the leg. An implanted port has two main parts:  Reservoir. The reservoir is round and will appear as a small, raised area under your skin. The reservoir is the part where a needle is inserted to give medicines or draw blood.  Catheter. The catheter is a thin, flexible tube that extends from the reservoir. The catheter is placed into a large vein. Medicine that is inserted into the reservoir goes into the catheter and then into the vein.  How will I care for my incision site? Do not get the incision site wet. Bathe or shower as directed by your health care provider. How is my port accessed? Special steps must be taken to access the port:  Before the port is accessed, a numbing cream can be placed on the skin. This helps numb the skin over the port site.  Your health care provider uses a sterile technique to access the port. ? Your health care provider must put on a mask and sterile gloves. ? The skin over your port is cleaned carefully with an antiseptic and allowed to dry. ? The port is gently pinched between sterile gloves, and a needle is inserted into the port.  Only "non-coring" port needles should be used to access the port. Once the port is accessed, a blood return should be checked. This helps ensure that the port  is in the vein and is not clogged.  If your port needs to remain accessed for a constant infusion, a clear (transparent) bandage will be placed over the needle site. The bandage and needle will need to be changed every week, or as directed by your health care provider.  Keep the bandage covering the needle clean and dry. Do not get it wet. Follow your health care provider's instructions on how to take a shower or bath while the port is accessed.  If your port does not need to stay accessed, no bandage is needed over the port.  What is flushing? Flushing helps keep the port from getting clogged. Follow your health care provider's instructions on how and when to flush the port. Ports are usually flushed with saline solution or a medicine called heparin. The need for flushing will depend on how the port is used.  If the port is used for intermittent medicines or blood draws, the port will need to be flushed: ? After medicines have been given. ? After blood has been drawn. ? As part of routine maintenance.  If a constant infusion is running, the port may not need to be flushed.  How long will my port stay implanted? The port can stay in for as long as your health care provider thinks it is needed. When it is time for the port to come out, surgery will be   done to remove it. The procedure is similar to the one performed when the port was put in. When should I seek immediate medical care? When you have an implanted port, you should seek immediate medical care if:  You notice a bad smell coming from the incision site.  You have swelling, redness, or drainage at the incision site.  You have more swelling or pain at the port site or the surrounding area.  You have a fever that is not controlled with medicine.  This information is not intended to replace advice given to you by your health care provider. Make sure you discuss any questions you have with your health care provider. Document  Released: 04/14/2005 Document Revised: 09/20/2015 Document Reviewed: 12/20/2012 Elsevier Interactive Patient Education  2017 [employer]  

## 2017-04-14 NOTE — Progress Notes (Signed)
Patient Care Team: Arnoldo Morale, MD as PCP - General (Family Medicine) Nicholas Lose, MD as Consulting Physician (Hematology and Oncology) Delice Bison, Charlestine Massed, NP as Nurse Practitioner (Hematology and Oncology) Kyung Rudd, MD as Consulting Physician (Radiation Oncology) Rolm Bookbinder, MD as Consulting Physician (General Surgery)  DIAGNOSIS:  Encounter Diagnoses  Name Primary?  . 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 Abnormal findings on diagnostic imaging of lung     SUMMARY OF ONCOLOGIC HISTORY:   Bilateral breast cancer ([place])   08/16/2015 Mammogram    Left breast LIQ 4.2 cm, 4 mm oval mass 12:00, 1.2 cm mass left UOQ, enlarged left axillary lymph node      08/16/2015 Mammogram    Right breast UIQ: 3.9 cm irregular mass, 1.5 cm mass in the right LOQ, 9 mm mass right UOQ, 6 mm mass right LIQ, enlarged lymph node Rt axilla      08/20/2015 Initial Diagnosis    Right breast biopsy 1:00: IDC grade 2 ER 95%, PR 2%, Ki 67 15%; HER-2 negative ratio 1.04; 10:00: IDC grade 2-3 dissimilar to 1:00 biopsy 10:00: ER 95%, PR 0%, K67: 25%, HER-2 positive ratio 6.42 right axillary lymph node positive for metastatic cancer      08/20/2015 Procedure    Left biopsy 8:00: IDC with DCIS, grade 2; ER 95%, PR 95%, Ki-67 30%, HER-2 negative ratio 1.2; 12:00: IDC with DCIS, grade 1 ER 95%, PR 95%, Ki-67 10% HER-2 negative ratio 1.14 not similar to this biopsy, left axillary lymph node positive,       09/13/2015 - 01/17/2016 Neo-Adjuvant Chemotherapy    Neoadjuvant dose dense Adriamycin and Cytoxan followed by Taxol Herceptin Perjeta, followed by Herceptin maintenance      01/21/2016 Breast MRI    Bilateral breast masses significantly decreased in size with mild residual NME, complete resolution of numerous other nodules in both breasts, bilateral axillary lymph nodes and bilateral retropectoral lymph nodes decreased in size      02/11/2016 Surgery    Right mastectomy: IDC 2 nodules, 2.5, 1 cm, margins negative, 3/7 lymph nodes positive T2 N1a stage II B, ER 95%, PR 2%, HER-2 negative, Ki-67 15% RCB burden 3.1 and 2.9 class II      02/11/2016 Surgery    Left mastectomy: 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  She is on letrozole as well as Neratinib since she was HER-2 positive.  She has had diarrhea related to Neratinib.  Finally the diarrhea has subsided.  She is still on oral potassium replacement therapy.  She is here for toxicity evaluation.  She had a recent PET/CT scan and is here to discuss the results.  REVIEW OF SYSTEMS:   Constitutional: Denies fevers, chills or abnormal weight loss Eyes: Denies blurriness of vision Ears, nose, mouth, throat, and face: Denies  mucositis or sore throat Respiratory: Denies cough, dyspnea or wheezes Cardiovascular: Denies palpitation, chest discomfort Gastrointestinal: Diarrhea Skin: Denies abnormal skin rashes Lymphatics: Denies new lymphadenopathy or easy bruising Neurological:Denies numbness, tingling or new weaknesses Behavioral/Psych: Mood is stable, no new changes  Extremities: No lower extremity edema All other systems were reviewed with the patient and are negative.  I have reviewed the past medical history, past surgical history, social history and family history with the patient and  they are unchanged from previous note.  ALLERGIES:  is allergic to no known allergies.  MEDICATIONS:  Current Outpatient Medications  Medication Sig Dispense Refill  . Calcium Carb-Cholecalciferol (CALCIUM 600+D) 600-800 MG-UNIT TABS Take 1 tablet by mouth daily.    . Cholecalciferol (VITAMIN D-3) 5000 units TABS Take 1 tablet by mouth daily.    Marland Kitchen letrozole (FEMARA) 2.5 MG tablet Take 1 tablet (2.5 mg total) by mouth daily. 90 tablet 3  . lidocaine-prilocaine (EMLA) cream Apply 1 application topically as needed (Apply 2- hours prior to access of port). 30 g 1  . lisinopril (PRINIVIL,ZESTRIL) 5 MG tablet Take 1 tablet (5 mg total) daily by mouth. 30 tablet 5  . loperamide (IMODIUM A-D) 2 MG tablet Take 1 tablet (2 mg total) by mouth 4 (four) times daily as needed for diarrhea or loose stools. 30 tablet 0  . NERLYNX 40 MG tablet Take 6 tablets (214m) by mouth once daily with food. 180 tablet 11  . potassium chloride (K-DUR,KLOR-CON) 10 MEQ tablet Take 2 tablets (20 mEq total) once for 1 dose by mouth. 60 tablet 3   No current facility-administered medications for this visit.    Facility-Administered Medications Ordered in Other Visits  Medication Dose Route Frequency Provider Last Rate Last Dose  . sodium chloride flush (NS) 0.9 % injection 10 mL  10 mL Intravenous PRN GNicholas Lose MD   10 mL at 04/14/17 0831    PHYSICAL EXAMINATION: ECOG PERFORMANCE STATUS: 1 - Symptomatic but completely ambulatory  Vitals:   04/14/17 0847  BP: (!) 149/92  Pulse: (!) 58  Resp: 18  Temp: (!) 97.4 F (36.3 C)  SpO2: 97%   Filed Weights   04/14/17 0847  Weight: 214 lb 3.2 oz (97.2 kg)    GENERAL:alert, no distress and comfortable SKIN: skin color, texture, turgor are normal, no rashes or significant lesions EYES: normal, Conjunctiva are pink and non-injected, sclera clear OROPHARYNX:no exudate, no erythema and lips, buccal mucosa, and tongue normal  NECK: supple, thyroid normal size,  non-tender, without nodularity LYMPH:  no palpable lymphadenopathy in the cervical, axillary or inguinal LUNGS: clear to auscultation and percussion with normal breathing effort HEART: regular rate & rhythm and no murmurs and no lower extremity edema ABDOMEN:abdomen soft, non-tender and normal bowel sounds MUSCULOSKELETAL:no cyanosis of digits and no clubbing  NEURO: alert & oriented x 3 with fluent speech, no focal motor/sensory deficits EXTREMITIES: No lower extremity edema  LABORATORY DATA:  I have reviewed the data as listed   Chemistry      Component Value Date/Time   NA 140 01/22/2017 1028   K 3.1 (L) 01/22/2017 1028   CL 105 11/24/2016 0316   CO2 27 01/22/2017 1028   BUN 12.4 01/22/2017 1028   CREATININE 0.7 01/22/2017 1028      Component Value Date/Time   CALCIUM 9.6 01/22/2017 1028   ALKPHOS 72 01/22/2017 1028   AST 37 (H) 01/22/2017 1028   ALT 42 01/22/2017 1028   BILITOT 0.82 01/22/2017 1028  Lab Results  Component Value Date   WBC 7.1 01/22/2017   HGB 12.9 01/22/2017   HCT 37.9 01/22/2017   MCV 85.7 01/22/2017   PLT 203 01/22/2017   NEUTROABS 4.4 01/22/2017    ASSESSMENT & PLAN:  Bilateral breast cancer ([place]) Treatment Summary: 1. Neoadjuvant chemotherapy with either AC followed by THP started 09/13/2015 and completed 12 weeks of Taxol 11/29/2015, Herceptin Perjeta 6; now on Herceptin  2. Bilateral mastectomies and lymph node surgery 02/11/16 3. Adjuvant radiation 04/07/2016 to 05/15/2016 4. Adjuvant antiestrogen therapy with Letrozole started 06/26/2016 5. 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 Herceptin maintenance completed July 2018  Current treatment:  1. Adjuvant antiestrogen therapy with letrozole 2.5 mg daily started 06/26/2016 2. Neratinib 240 mg daily started 11/20/2016  Neratinib toxicities: Diarrhea: Subsided with Imodium and dietary changes  Hypokalemia: On 2 tablets of potassium daily.  We will see what today's potassium level is and then decide if she needs to continue with the same dosage.  PET/CT scan 04/09/2017: Mildly hypermetabolic bilateral hilar and left infrahilar lymph nodes maximum SUV 4.9.  Left lower lobe lung nodules with SUV of 2.5 differential diagnosis infection versus inflammation versus malignancy. Plan to obtain a CT chest and follow-up in 3 months Return to clinic in 3 months for follow-up   I spent 25 minutes talking to the patient of which more than half was spent in counseling and coordination of care.  Orders Placed This Encounter  Procedures  . CT Chest W Contrast    Standing Status:   Future    Standing Expiration Date:   07/13/2017    Order Specific Question:   If indicated for the ordered procedure, I authorize the administration of contrast media per Radiology protocol    Answer:   Yes    Order Specific Question:   Is patient pregnant?    Answer:   No    Order Specific Question:   Preferred imaging location?    Answer:   [facility]    Order Specific Question:   Radiology Contrast Protocol - do NOT remove file path    Answer:   [email or web address]    Order Specific Question:   Reason for Exam additional comments    Answer:   lung nodules with breast cancer evaluation  . CBC with Differential    Standing Status:   Future    Standing Expiration Date:   04/14/2018  . Comprehensive metabolic panel    Standing Status:   Future    Standing Expiration Date:   04/14/2018   The patient has  a good understanding of the overall plan. she agrees with it. she will call with any problems that may develop before the next visit here.   Rulon Eisenmenger, MD 04/14/17

## 2017-04-14 NOTE — Telephone Encounter (Signed)
Gave patient avs and calendar with appts per 12/18 los - gave patient phone number for central radiology scheduling for her CT scan.

## 2017-04-14 NOTE — Patient Instructions (Signed)

## 2017-04-16 ENCOUNTER — Ambulatory Visit: Payer: Self-pay | Admitting: Hematology and Oncology

## 2017-04-29 ENCOUNTER — Encounter: Payer: Self-pay | Admitting: Cardiothoracic Surgery

## 2017-05-19 ENCOUNTER — Other Ambulatory Visit: Payer: Self-pay | Admitting: Family Medicine

## 2017-05-19 DIAGNOSIS — E876 Hypokalemia: Secondary | ICD-10-CM

## 2017-05-19 MED ORDER — POTASSIUM CHLORIDE CRYS ER 10 MEQ PO TBCR: 20.0000 meq | EXTENDED_RELEASE_TABLET | Freq: Once | ORAL | 3 refills | Status: DC

## 2017-05-28 ENCOUNTER — Inpatient Hospital Stay: Payer: Self-pay | Attending: Hematology and Oncology

## 2017-05-28 DIAGNOSIS — Z9013 Acquired absence of bilateral breasts and nipples: Secondary | ICD-10-CM | POA: Insufficient documentation

## 2017-05-28 DIAGNOSIS — Z9221 Personal history of antineoplastic chemotherapy: Secondary | ICD-10-CM | POA: Insufficient documentation

## 2017-05-28 DIAGNOSIS — Z452 Encounter for adjustment and management of vascular access device: Secondary | ICD-10-CM | POA: Insufficient documentation

## 2017-05-28 DIAGNOSIS — C50012 Malignant neoplasm of nipple and areola, left female breast: Secondary | ICD-10-CM | POA: Insufficient documentation

## 2017-05-28 DIAGNOSIS — Z170 Estrogen receptor positive status [ER+]: Secondary | ICD-10-CM | POA: Insufficient documentation

## 2017-05-28 DIAGNOSIS — Z79811 Long term (current) use of aromatase inhibitors: Secondary | ICD-10-CM | POA: Insufficient documentation

## 2017-05-28 DIAGNOSIS — Z923 Personal history of irradiation: Secondary | ICD-10-CM | POA: Insufficient documentation

## 2017-05-28 DIAGNOSIS — Z95828 Presence of other vascular implants and grafts: Secondary | ICD-10-CM

## 2017-05-28 DIAGNOSIS — C50011 Malignant neoplasm of nipple and areola, right female breast: Secondary | ICD-10-CM | POA: Insufficient documentation

## 2017-05-28 MED ORDER — HEPARIN SOD (PORK) LOCK FLUSH 100 UNIT/ML IV SOLN
500.0000 [IU] | Freq: Once | INTRAVENOUS | Status: AC | PRN
  Administered 2017-05-28: 500 [IU] via INTRAVENOUS
  Filled 2017-05-28: qty 5

## 2017-05-28 MED ORDER — SODIUM CHLORIDE 0.9% FLUSH
10.0000 mL | INTRAVENOUS | Status: DC | PRN
Start: 2017-05-28 — End: 2017-05-28
  Administered 2017-05-28: 10 mL via INTRAVENOUS
  Filled 2017-05-28: qty 10

## 2017-06-16 ENCOUNTER — Other Ambulatory Visit: Payer: Self-pay

## 2017-06-16 ENCOUNTER — Encounter: Payer: Self-pay | Admitting: Family Medicine

## 2017-06-16 ENCOUNTER — Ambulatory Visit: Payer: Self-pay | Attending: Family Medicine | Admitting: Family Medicine

## 2017-06-16 VITALS — BP 179/111 | HR 57 | Temp 98.9°F | Resp 16 | Wt 217.0 lb

## 2017-06-16 DIAGNOSIS — Z9013 Acquired absence of bilateral breasts and nipples: Secondary | ICD-10-CM | POA: Insufficient documentation

## 2017-06-16 DIAGNOSIS — Z9851 Tubal ligation status: Secondary | ICD-10-CM | POA: Insufficient documentation

## 2017-06-16 DIAGNOSIS — C50019 Malignant neoplasm of nipple and areola, unspecified female breast: Secondary | ICD-10-CM | POA: Insufficient documentation

## 2017-06-16 DIAGNOSIS — Z1211 Encounter for screening for malignant neoplasm of colon: Secondary | ICD-10-CM

## 2017-06-16 DIAGNOSIS — I10 Essential (primary) hypertension: Secondary | ICD-10-CM | POA: Insufficient documentation

## 2017-06-16 DIAGNOSIS — Z79899 Other long term (current) drug therapy: Secondary | ICD-10-CM | POA: Insufficient documentation

## 2017-06-16 DIAGNOSIS — Z923 Personal history of irradiation: Secondary | ICD-10-CM | POA: Insufficient documentation

## 2017-06-16 DIAGNOSIS — E876 Hypokalemia: Secondary | ICD-10-CM | POA: Insufficient documentation

## 2017-06-16 DIAGNOSIS — C50011 Malignant neoplasm of nipple and areola, right female breast: Secondary | ICD-10-CM

## 2017-06-16 DIAGNOSIS — Z9221 Personal history of antineoplastic chemotherapy: Secondary | ICD-10-CM | POA: Insufficient documentation

## 2017-06-16 DIAGNOSIS — R918 Other nonspecific abnormal finding of lung field: Secondary | ICD-10-CM | POA: Insufficient documentation

## 2017-06-16 DIAGNOSIS — Z9889 Other specified postprocedural states: Secondary | ICD-10-CM | POA: Insufficient documentation

## 2017-06-16 DIAGNOSIS — C50012 Malignant neoplasm of nipple and areola, left female breast: Secondary | ICD-10-CM

## 2017-06-16 DIAGNOSIS — Z170 Estrogen receptor positive status [ER+]: Secondary | ICD-10-CM | POA: Insufficient documentation

## 2017-06-16 DIAGNOSIS — K219 Gastro-esophageal reflux disease without esophagitis: Secondary | ICD-10-CM | POA: Insufficient documentation

## 2017-06-16 NOTE — Progress Notes (Signed)
Subjective:  Patient ID: Felicia [name], female    DOB: [birth year]  [name]: 57 y.o. MRN: [id number]  CC: Follow-up  HPI Felicia [name] is a 57 year old female with a PMH significant for HTN and bilateral breast cancer diagnosed in 07/2015 presents today for follow-up.  Status post bilateral mastectomy, s/p bilateral lymph node excision, chemotherapy and radiation. Continues to follow-up with oncology. PET scan 12/18 revealed mildly hypermetabolic bilateral hilar and left infrahilar lymph nodes, with a cluster of several small left lower lobe nodules. Oncologist recommends follow-up every 3 months. Her next appointment is in March.    Endorses compliance with medication. Takes only Lisinopril per Dr.Guadena. Denies dry cough but reports not being able to take a full deep breath since starting lisinopril. Denies GERD symptoms. Remains on potassium due to hypokalemia (last potassium was 3.7 on 12/18). Last Echo 7/18 showed EF of 55-60%. Denies SOB, headaches, chest pain, swelling of lower extremities, or palpitations.     Past Medical History:  Diagnosis Date  . Cancer ([place])    breast  . GERD (gastroesophageal reflux disease)    Past Surgical History:  Procedure Laterality Date  . MASTECTOMY MODIFIED RADICAL Bilateral 02/11/2016  . MASTECTOMY MODIFIED RADICAL Bilateral 02/11/2016   Procedure: BILATERAL MASTECTOMY MODIFIED RADICAL;  Surgeon: Rolm Bookbinder, MD;  Location: [place];  Service: General;  Laterality: Bilateral;  . PORTACATH PLACEMENT N/A 09/04/2015   Procedure: INSERTION PORT-A-CATH WITH [place];  Surgeon: Rolm Bookbinder, MD;  Location: [place];  Service: General;  Laterality: N/A;  . TUBAL LIGATION     Allergies  Allergen Reactions  . No Known Allergies      Outpatient Medications Prior to Visit  Medication Sig Dispense Refill  . Calcium Carb-Cholecalciferol (CALCIUM 600+D) 600-800 MG-UNIT TABS Take 1 tablet by mouth daily.      . Cholecalciferol (VITAMIN D-3) 5000 units TABS Take 1 tablet by mouth daily.    Marland Kitchen lidocaine-prilocaine (EMLA) cream Apply 1 application topically as needed (Apply 2- hours prior to access of port). 30 g 1  . lisinopril (PRINIVIL,ZESTRIL) 5 MG tablet Take 1 tablet (5 mg total) daily by mouth. 30 tablet 5  . loperamide (IMODIUM A-D) 2 MG tablet Take 1 tablet (2 mg total) by mouth 4 (four) times daily as needed for diarrhea or loose stools. 30 tablet 0  . NERLYNX 40 MG tablet Take 6 tablets (240mg ) by mouth once daily with food. 180 tablet 11  . letrozole (FEMARA) 2.5 MG tablet Take 1 tablet (2.5 mg total) by mouth daily. (Patient not taking: Reported on 06/16/2017) 90 tablet 3  . potassium chloride (K-DUR,KLOR-CON) 10 MEQ tablet Take 2 tablets (20 mEq total) by mouth once for 1 dose. 60 tablet 3   Facility-Administered Medications Prior to Visit  Medication Dose Route Frequency Provider Last Rate Last Dose  . sodium chloride flush (NS) 0.9 % injection 10 mL  10 mL Intravenous PRN Nicholas Lose, MD   10 mL at 04/14/17 0831    ROS Review of Systems  Constitutional: Negative for fatigue.  Respiratory: Negative for cough and shortness of breath.   Cardiovascular: Negative for chest pain and leg swelling.  Neurological: Negative for dizziness, weakness and headaches.  Psychiatric/Behavioral: Negative.     Objective:  BP (!) 179/111 (BP Location: Right Leg, Patient Position: Sitting, Cuff Size: Large)   Pulse (!) 57   Temp 98.9 F (37.2 C) (Oral)   Resp 16   Wt 217 lb (98.4  kg)   SpO2 99%   BMI 42.38 kg/m   BP/Weight 06/16/2017 04/14/2017 77/02/6578  Systolic BP 038 333 832  Diastolic BP 919 92 75  Wt. (Lbs) 217 214.2 215  BMI 42.38 41.83 41.99   BMP Latest Ref Rng & Units 04/14/2017 01/22/2017 12/18/2016  Glucose 70 - 140 mg/dl 100 94 94  BUN 7.0 - 26.0 mg/dL 12.5 12.4 12.9  Creatinine 0.6 - 1.1 mg/dL 0.7 0.7 0.7  Sodium 136 - 145 mEq/L 137 140 138  Potassium 3.5 - 5.1 mEq/L 3.7  3.1(L) 3.1(L)  Chloride 101 - 111 mmol/L - - -  CO2 22 - 29 mEq/L 23 27 26   Calcium 8.4 - 10.4 mg/dL 9.5 9.6 9.4    Physical Exam  Constitutional: She is oriented to person, place, and time. She appears well-developed and well-nourished. No distress.  Cardiovascular: Normal rate, regular rhythm, normal heart sounds and intact distal pulses.  Swelling on bilateral wrist r/t removal of lymph nodes  Pulmonary/Chest: Effort normal and breath sounds normal. She has no wheezes.  Left chest port  Abdominal: Soft. Bowel sounds are normal.  Neurological: She is alert and oriented to person, place, and time.  Psychiatric: She has a normal mood and affect. Her behavior is normal.     Assessment & Plan:   1. 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 Unable to obtain due to chest port.  Labs are drawn at the cancer center Continue f/u with oncology   2. Essential hypertension Uncontrolled. Previously on Lisinopril and Amlodipine.  Amlodipine discontinued by Dr. Lindi Adie. Will await next appointment with Dr.Gudena.  No changes at this time.  Continue Lisinopril 5 mg once daily.   3. Screening for colon cancer - Ambulatory referral to Gastroenterology   No orders of the defined types were placed in this encounter.   Follow-up: Return in about 3 months (around 09/13/2017) for follow up of Hypertension.   Jonnie Kind DNP Student

## 2017-06-16 NOTE — Progress Notes (Signed)
F/u HTN

## 2017-07-09 ENCOUNTER — Other Ambulatory Visit: Payer: Self-pay

## 2017-07-09 ENCOUNTER — Telehealth: Payer: Self-pay

## 2017-07-09 NOTE — Telephone Encounter (Signed)
Spanish interpreter Gregary Signs called on behalf of pt that appt with Dr Lindi Adie is scheduled for Monday 3-18 but she has not been scheduled for her CT of chest yet.  Noted CT chest referral is incomplete. Sent urgent inbasket to [facility] in [place] and called Gregary Signs back to relay to pt that we are working on getting her scheduled for CT chest and then appt with Dr Lindi Adie so he will have the results.  Gregary Signs voiced understanding and will contact her/ patient tomorrow when receive more info from [place].

## 2017-07-10 ENCOUNTER — Other Ambulatory Visit: Payer: Self-pay

## 2017-07-10 DIAGNOSIS — T451X5A Adverse effect of antineoplastic and immunosuppressive drugs, initial encounter: Principal | ICD-10-CM

## 2017-07-10 DIAGNOSIS — G620 Drug-induced polyneuropathy: Secondary | ICD-10-CM

## 2017-07-10 DIAGNOSIS — C50812 Malignant neoplasm of overlapping sites of left female breast: Secondary | ICD-10-CM

## 2017-07-10 DIAGNOSIS — C50811 Malignant neoplasm of overlapping sites of right female breast: Secondary | ICD-10-CM

## 2017-07-10 NOTE — Progress Notes (Signed)
Called Almyra Free (interpreter) to translate appt to patient regarding her upcoming CT next Monday 07/13/17. Pt to be NPO 4 hrs prior to appt. Pt is scheduled at [facility] at 7am and will be coming in for lab/md afterwards. Confirmed appt and encourage to call with questions.

## 2017-07-13 ENCOUNTER — Ambulatory Visit (HOSPITAL_COMMUNITY)
Admission: RE | Admit: 2017-07-13 | Discharge: 2017-07-13 | Disposition: A | Discharge status: Home / Self Care | Payer: Self-pay | Source: Ambulatory Visit | Attending: Hematology and Oncology | Admitting: Hematology and Oncology

## 2017-07-13 ENCOUNTER — Telehealth: Payer: Self-pay | Admitting: Pharmacy Technician

## 2017-07-13 ENCOUNTER — Inpatient Hospital Stay: Payer: Self-pay

## 2017-07-13 ENCOUNTER — Telehealth: Payer: Self-pay | Admitting: Pharmacist

## 2017-07-13 ENCOUNTER — Inpatient Hospital Stay: Payer: Self-pay | Attending: Hematology and Oncology | Admitting: Hematology and Oncology

## 2017-07-13 ENCOUNTER — Encounter (HOSPITAL_COMMUNITY): Payer: Self-pay

## 2017-07-13 DIAGNOSIS — Z923 Personal history of irradiation: Secondary | ICD-10-CM | POA: Insufficient documentation

## 2017-07-13 DIAGNOSIS — R918 Other nonspecific abnormal finding of lung field: Secondary | ICD-10-CM | POA: Insufficient documentation

## 2017-07-13 DIAGNOSIS — Z170 Estrogen receptor positive status [ER+]: Principal | ICD-10-CM

## 2017-07-13 DIAGNOSIS — C50011 Malignant neoplasm of nipple and areola, right female breast: Secondary | ICD-10-CM | POA: Insufficient documentation

## 2017-07-13 DIAGNOSIS — C50012 Malignant neoplasm of nipple and areola, left female breast: Secondary | ICD-10-CM | POA: Insufficient documentation

## 2017-07-13 DIAGNOSIS — Z79811 Long term (current) use of aromatase inhibitors: Secondary | ICD-10-CM | POA: Insufficient documentation

## 2017-07-13 DIAGNOSIS — G620 Drug-induced polyneuropathy: Secondary | ICD-10-CM

## 2017-07-13 DIAGNOSIS — C773 Secondary and unspecified malignant neoplasm of axilla and upper limb lymph nodes: Secondary | ICD-10-CM | POA: Insufficient documentation

## 2017-07-13 DIAGNOSIS — R590 Localized enlarged lymph nodes: Secondary | ICD-10-CM | POA: Insufficient documentation

## 2017-07-13 DIAGNOSIS — K521 Toxic gastroenteritis and colitis: Secondary | ICD-10-CM

## 2017-07-13 DIAGNOSIS — E876 Hypokalemia: Secondary | ICD-10-CM | POA: Insufficient documentation

## 2017-07-13 DIAGNOSIS — Z9013 Acquired absence of bilateral breasts and nipples: Secondary | ICD-10-CM | POA: Insufficient documentation

## 2017-07-13 DIAGNOSIS — C7800 Secondary malignant neoplasm of unspecified lung: Secondary | ICD-10-CM | POA: Insufficient documentation

## 2017-07-13 DIAGNOSIS — Z9221 Personal history of antineoplastic chemotherapy: Secondary | ICD-10-CM | POA: Insufficient documentation

## 2017-07-13 LAB — COMPREHENSIVE METABOLIC PANEL
ALT: 33 U/L (ref 0–55)
AST: 27 U/L (ref 5–34)
Albumin: 4 g/dL (ref 3.5–5.0)
Alkaline Phosphatase: 76 U/L (ref 40–150)
Anion gap: 8 (ref 3–11)
BUN: 10 mg/dL (ref 7–26)
CO2: 24 mmol/L (ref 22–29)
CREATININE: 0.71 mg/dL (ref 0.60–1.10)
Calcium: 9.7 mg/dL (ref 8.4–10.4)
Chloride: 105 mmol/L (ref 98–109)
GFR calc Af Amer: >60 mL/min (ref >60)
Glucose, Bld: 101 mg/dL (ref 70–140)
Potassium: 3.8 mmol/L (ref 3.5–5.1)
SODIUM: 137 mmol/L (ref 136–145)
Total Bilirubin: 0.6 mg/dL (ref 0.2–1.2)
Total Protein: 7.4 g/dL (ref 6.4–8.3)

## 2017-07-13 LAB — CBC WITH DIFFERENTIAL/PLATELET
Basophils Absolute: 0 10*3/uL (ref 0.0–0.1)
Basophils Relative: 0 %
EOS ABS: 0.1 10*3/uL (ref 0.0–0.5)
EOS PCT: 1 %
HCT: 40.2 % (ref 34.8–46.6)
Hemoglobin: 13.4 g/dL (ref 11.6–15.9)
Lymphocytes Relative: 26 %
Lymphs Abs: 1.9 10*3/uL (ref 0.9–3.3)
MCH: 28.8 pg (ref 25.1–34.0)
MCHC: 33.3 g/dL (ref 31.5–36.0)
MCV: 86.3 fL (ref 79.5–101.0)
MONOS PCT: 5 %
Monocytes Absolute: 0.3 10*3/uL (ref 0.1–0.9)
Neutro Abs: 4.9 10*3/uL (ref 1.5–6.5)
Neutrophils Relative %: 68 %
Platelets: 191 10*3/uL (ref 145–400)
RBC: 4.66 MIL/uL (ref 3.70–5.45)
RDW: 12.8 % (ref 11.2–14.5)
WBC: 7.2 10*3/uL (ref 3.9–10.3)

## 2017-07-13 MED ORDER — HEPARIN SOD (PORK) LOCK FLUSH 100 UNIT/ML IV SOLN
INTRAVENOUS | Status: AC
  Administered 2017-07-13: 500 [IU] via INTRAVENOUS
  Filled 2017-07-13: qty 5

## 2017-07-13 MED ORDER — IOPAMIDOL (ISOVUE-300) INJECTION 61%
INTRAVENOUS | Status: AC
  Administered 2017-07-13: 75 mL via INTRAVENOUS
  Filled 2017-07-13: qty 75

## 2017-07-13 MED ORDER — IOPAMIDOL (ISOVUE-300) INJECTION 61%
75.0000 mL | Freq: Once | INTRAVENOUS | Status: AC | PRN
  Administered 2017-07-13: 75 mL via INTRAVENOUS

## 2017-07-13 MED ORDER — HEPARIN SOD (PORK) LOCK FLUSH 100 UNIT/ML IV SOLN
500.0000 [IU] | Freq: Once | INTRAVENOUS | Status: AC
  Administered 2017-07-13: 500 [IU] via INTRAVENOUS

## 2017-07-13 MED ORDER — PALBOCICLIB 125 MG PO CAPS: 125.0000 mg | ORAL_CAPSULE | Freq: Every day | ORAL | 3 refills | Status: DC

## 2017-07-13 NOTE — Telephone Encounter (Signed)
Oral Oncology Pharmacist Encounter  Received new prescription for Ibrance (palbociclib) for the treatment of now metastatic, hormone receptor positive breast cancer in conjunction with Femara, planned duration until disease progression or unacceptable toxicity.  Labs from 07/13/2017 assessed, okay for treatment. Labs will be repeated in 2 weeks  Current medication list in Epic reviewed, no DDIs with Ibrance symptoms identified  Noted that patient is without prescription insurance coverage. Patient previously on manufacturer assistance for Nerlynx. Oral oncology clinic will work with manufacturer to try to obtain medication per patient.  Oral Oncology Clinic will continue to follow for initial counseling and start date.  Johny Drilling, PharmD, BCPS, BCOP 07/13/2017 11:50 AM Oral Oncology Clinic [phone]

## 2017-07-13 NOTE — Telephone Encounter (Signed)
Oral Oncology Patient Advocate Encounter  Oral chemo pharmacist met patient in exam room for counseling and to complete application for [facility] in an effort to reduce patient's out of pocket expense for Ibrance to $0.    Patient noted to be uninsured for prescription medications.    Application completed and faxed to [phone].   [employer] patient assistance phone number for follow up is [phone].   This encounter will be updated until final determination.   Felicia [name]. Melynda Keller, [name] Felicia [name] [phone] 07/13/2017 12:58 PM

## 2017-07-13 NOTE — Assessment & Plan Note (Signed)
Treatment Summary: 1. Neoadjuvant chemotherapy with either AC followed by THP started 09/13/2015 and completed 12 weeks of Taxol 11/29/2015, Herceptin Perjeta 6; now on Herceptin  2. Bilateral mastectomies and lymph node surgery 02/11/16 3. Adjuvant radiation 04/07/2016 to 05/15/2016 4. Adjuvant antiestrogen therapy with Letrozole started 06/26/2016 5. 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 Herceptin maintenance completed July 2018  Current treatment:  1. Adjuvant antiestrogen therapy with letrozole 2.5 mg daily started 06/26/2016 2. Neratinib 240 mg daily started 11/20/2016  Neratinib toxicities: Diarrhea: Subsided with Imodium and dietary changes  Hypokalemia: On 2 tablets of potassium daily.  CT chest 07/13/2017:

## 2017-07-13 NOTE — Progress Notes (Signed)
Patient Care Team: [name], MD as PCP - General (Family Medicine) Nicholas Lose, MD as Consulting Physician (Hematology and Oncology) Delice Bison, Charlestine Massed, NP as Nurse Practitioner (Hematology and Oncology) Kyung Rudd, MD as Consulting Physician (Radiation Oncology) Rolm Bookbinder, MD as Consulting Physician (General Surgery)  DIAGNOSIS:  Encounter Diagnosis  Name Primary?  . 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, estrogen receptor positive ([place])     SUMMARY OF ONCOLOGIC HISTORY:   Bilateral breast cancer ([place])   08/16/2015 Mammogram    Left breast LIQ 4.2 cm, 4 mm oval mass 12:00, 1.2 cm mass left UOQ, enlarged left axillary lymph node      08/16/2015 Mammogram    Right breast UIQ: 3.9 cm irregular mass, 1.5 cm mass in the right LOQ, 9 mm mass right UOQ, 6 mm mass right LIQ, enlarged lymph node Rt axilla      08/20/2015 Initial Diagnosis    Right breast biopsy 1:00: IDC grade 2 ER 95%, PR 2%, Ki 67 15%; HER-2 negative ratio 1.04; 10:00: IDC grade 2-3 dissimilar to 1:00 biopsy 10:00: ER 95%, PR 0%, K67: 25%, HER-2 positive ratio 6.42 right axillary lymph node positive for metastatic cancer      08/20/2015 Procedure    Left biopsy 8:00: IDC with DCIS, grade 2; ER 95%, PR 95%, Ki-67 30%, HER-2 negative ratio 1.2; 12:00: IDC with DCIS, grade 1 ER 95%, PR 95%, Ki-67 10% HER-2 negative ratio 1.14 not similar to this biopsy, left axillary lymph node positive,       09/13/2015 - 01/17/2016 Neo-Adjuvant Chemotherapy    Neoadjuvant dose dense Adriamycin and Cytoxan followed by Taxol Herceptin Perjeta, followed by Herceptin maintenance      01/21/2016 Breast MRI    Bilateral breast masses significantly decreased in size with mild residual NME, complete resolution of numerous other nodules in both breasts, bilateral axillary lymph nodes and bilateral retropectoral lymph nodes decreased in size      02/11/2016 Surgery    Right mastectomy: IDC 2 nodules,  2.5, 1 cm, margins negative, 3/7 lymph nodes positive T2 N1a stage II B, ER 95%, PR 2%, HER-2 negative, Ki-67 15% RCB burden 3.1 and 2.9 class II      02/11/2016 Surgery    Left mastectomy: IDC 3.1 cm with DCIS, margins negative, 0/8 lymph nodes, T2 N0 stage II a, RCB burden 2.18, class II, ER 100%, PR 95%, HER-2 negative ratio 1.2, Ki-67 30%       04/07/2016 - 05/15/2016 Radiation Therapy    Adjuvant radiation      06/26/2016 - 07/13/2017 Anti-estrogen oral therapy    Letrozole 2.5 mg daily      11/20/2016 Miscellaneous    Neratinib 240 mg daily discontinued 07/13/2017 when she presented with metastatic disease to the lungs      07/13/2017 Relapse/Recurrence    Progression of right and left hilar lymph nodes and bilateral lung nodules      07/13/2017 -  Anti-estrogen oral therapy    Ibrance with letrozole       CHIEF COMPLIANT: Follow-up to discuss results of the CT chest  INTERVAL HISTORY: Felicia [name] is a 57 year old with above-mentioned history of metastatic breast cancer.  She had a CT scan of the chest today and is here today to discuss results.  The CT showed progression of disease in bilateral right and left hilar regions.  It also showed progression of bilateral lung nodules.  Unfortunately these are in areas where biopsies not  feasible.  She is accompanied by her daughter to discuss treatment options.  She does not report any lung symptoms.  She is mostly concerned about her hypertension.  REVIEW OF SYSTEMS:   Constitutional: Denies fevers, chills or abnormal weight loss Eyes: Denies blurriness of vision Ears, nose, mouth, throat, and face: Denies mucositis or sore throat Respiratory: Denies cough, dyspnea or wheezes Cardiovascular: Denies palpitation, chest discomfort Gastrointestinal:  Denies nausea, heartburn or change in bowel habits Skin: Denies abnormal skin rashes Lymphatics: Denies new lymphadenopathy or easy bruising Neurological:Denies  numbness, tingling or new weaknesses Behavioral/Psych: Mood is stable, no new changes  Extremities: No lower extremity edema All other systems were reviewed with the patient and are negative.  I have reviewed the past medical history, past surgical history, social history and family history with the patient and they are unchanged from previous note.  ALLERGIES:  is allergic to no known allergies.  MEDICATIONS:  Current Outpatient Medications  Medication Sig Dispense Refill  . Calcium Carb-Cholecalciferol (CALCIUM 600+D) 600-800 MG-UNIT TABS Take 1 tablet by mouth daily.    . Cholecalciferol (VITAMIN D-3) 5000 units TABS Take 1 tablet by mouth daily.    Marland Kitchen letrozole (FEMARA) 2.5 MG tablet Take 1 tablet (2.5 mg total) by mouth daily. (Patient not taking: Reported on 06/16/2017) 90 tablet 3  . lidocaine-prilocaine (EMLA) cream Apply 1 application topically as needed (Apply 2- hours prior to access of port). 30 g 1  . lisinopril (PRINIVIL,ZESTRIL) 5 MG tablet Take 1 tablet (5 mg total) daily by mouth. 30 tablet 5  . loperamide (IMODIUM A-D) 2 MG tablet Take 1 tablet (2 mg total) by mouth 4 (four) times daily as needed for diarrhea or loose stools. 30 tablet 0  . palbociclib (IBRANCE) 125 MG capsule Take 1 capsule (125 mg total) by mouth daily with breakfast. Take whole with food. 21 capsule 3  . potassium chloride (K-DUR,KLOR-CON) 10 MEQ tablet Take 2 tablets (20 mEq total) by mouth once for 1 dose. 60 tablet 3   No current facility-administered medications for this visit.    Facility-Administered Medications Ordered in Other Visits  Medication Dose Route Frequency Provider Last Rate Last Dose  . sodium chloride flush (NS) 0.9 % injection 10 mL  10 mL Intravenous PRN Nicholas Lose, MD   10 mL at 04/14/17 0831    PHYSICAL EXAMINATION: ECOG PERFORMANCE STATUS: 1 - Symptomatic but completely ambulatory  Vitals:   07/13/17 0906  BP: (!) 187/104  Pulse: 60  Resp: 18  Temp: (!) 97.4 F (36.3  C)  SpO2: 99%   Filed Weights   07/13/17 0906  Weight: 212 lb 12.8 oz (96.5 kg)    GENERAL:alert, no distress and comfortable SKIN: skin color, texture, turgor are normal, no rashes or significant lesions EYES: normal, Conjunctiva are pink and non-injected, sclera clear OROPHARYNX:no exudate, no erythema and lips, buccal mucosa, and tongue normal  NECK: supple, thyroid normal size, non-tender, without nodularity LYMPH:  no palpable lymphadenopathy in the cervical, axillary or inguinal LUNGS: clear to auscultation and percussion with normal breathing effort HEART: regular rate & rhythm and no murmurs and no lower extremity edema ABDOMEN:abdomen soft, non-tender and normal bowel sounds MUSCULOSKELETAL:no cyanosis of digits and no clubbing  NEURO: alert & oriented x 3 with fluent speech, no focal motor/sensory deficits EXTREMITIES: No lower extremity edema  LABORATORY DATA:  I have reviewed the data as listed CMP Latest Ref Rng & Units 07/13/2017 04/14/2017 01/22/2017  Glucose 70 - 140 mg/dL 101 100  94  BUN 7 - 26 mg/dL 10 12.5 12.4  Creatinine 0.60 - 1.10 mg/dL 0.71 0.7 0.7  Sodium 136 - 145 mmol/L 137 137 140  Potassium 3.5 - 5.1 mmol/L 3.8 3.7 3.1(L)  Chloride 98 - 109 mmol/L 105 - -  CO2 22 - 29 mmol/L '24 23 27  '$ Calcium 8.4 - 10.4 mg/dL 9.7 9.5 9.6  Total Protein 6.4 - 8.3 g/dL 7.4 7.8 7.3  Total Bilirubin 0.2 - 1.2 mg/dL 0.6 0.94 0.82  Alkaline Phos 40 - 150 U/L 76 74 72  AST 5 - 34 U/L 27 29 37(H)  ALT 0 - 55 U/L 33 31 42    Lab Results  Component Value Date   WBC 7.2 07/13/2017   HGB 13.4 07/13/2017   HCT 40.2 07/13/2017   MCV 86.3 07/13/2017   PLT 191 07/13/2017   NEUTROABS 4.9 07/13/2017    ASSESSMENT & PLAN:  Bilateral breast cancer ([place]) Treatment Summary: 1. Neoadjuvant chemotherapy with either AC followed by THP started 09/13/2015 and completed 12 weeks of Taxol 11/29/2015, Herceptin Perjeta 6; now on Herceptin  2. Bilateral mastectomies and lymph node  surgery 02/11/16 3. Adjuvant radiation 04/07/2016 to 05/15/2016 4. Adjuvant antiestrogen therapy with Letrozole started 06/26/2016 5. 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 Herceptin maintenance completed July 2018  Current treatment:  1. Adjuvant antiestrogen therapy with letrozole 2.5 mg daily started 06/26/2016 2. Neratinib 240 mg daily started 11/20/2016 discontinued 07/13/2017  Neratinib toxicities: Diarrhea: Subsided with Imodium and dietary changes  Hypokalemia: On 2 tablets of potassium daily.  CT chest 07/13/2017: Progression of disease with increase in the size of the right and left hilar lymph nodes as well as the lung nodules in both her lungs  Recommendation: Ibrance with letrozole and discontinue Neratinib. Ibrance: I discussed the risks and benefits of Ibrance including myelosuppression especially neutropenia and with that risk of infection, there is risk of pulmonary embolism and mild peripheral neuropathy as well. Fatigue, nausea, diarrhea, decreased appetite as well as alopecia and thrombocytopenia are also potential side effects of Ibrance Her oral chemotherapy pharmacist Evelena Peat was able to come and counsel her regarding the drug.  Return to clinic in 2 weeks for labs and follow-up   I spent 25 minutes talking to the patient of which more than half was spent in counseling and coordination of care.  Orders Placed This Encounter  Procedures  . CBC with Differential (Cancer Center Only)    Standing Status:    Standing    Number of Occurrences:   20    Standing Expiration Date:   07/14/2018  . CMP ([place] only)    Standing Status:   Standing    Number of Occurrences:   20    Standing Expiration Date:   07/14/2018  . CBC with Differential (Cancer Center Only)    Standing Status:   Future    Standing Expiration Date:   07/14/2018  . CMP ([place] only)    Standing Status:   Future    Standing Expiration Date:   07/14/2018  The patient has a good understanding of the overall plan. she agrees with it. she will call with any problems that may develop before the next visit here.   Harriette Ohara, MD 07/13/17

## 2017-07-13 NOTE — Telephone Encounter (Signed)
Oral Chemotherapy Pharmacist Encounter   I spoke with patient, daughter, and Spanish interpreter, in exam room for overview of: Ibrance.   Counseled patient on administration, dosing, side effects, monitoring, drug-food interactions, safe handling, storage, and disposal.  Patient will take Ibrance 125 mg capsules, 1 capsule by mouth once daily with breakfast, for 3 weeks on, 1 week off.  Patient knows to avoid grapefruit and grapefruit juice.  Patient is taking Femara once daily with  breakfast.  Ibrance start date: 07/13/2017  Side effects include but not limited to: fatigue, hair loss, GI upset, nausea, decreased blood counts, and increased upper respiratory infections.  Reviewed with patient importance of keeping a medication schedule and plan for any missed doses.   Ms. Felicia [name] voiced understanding and appreciation.   All questions answered. Medication reconciliation performed and medication/allergy list updated.  Patient without prescription insurance coverage.   Discussed with patient manufacturer assistance process. Patient signed Training and development officer for [employer]. Application status will be documented in a separate encounter.   Dispensed samples to patient to decrease delay and start:  Medication: Ibrance 125 mg capsules Instructions: Take 1 capsule by mouth once daily with breakfast, take for 21 days on 7 days off, repeat every 28 days. Quantity dispensed: 21 Days supply: 28 Manufacturer: [employer] Lot: [id number] Exp: 09/2018   Patient knows to call the office with questions or concerns. Oral Oncology Clinic will continue to follow.  Thank you,  Johny Drilling, PharmD, BCPS, BCOP 07/13/2017   12:01 PM Oral Oncology Clinic [phone]

## 2017-07-14 ENCOUNTER — Telehealth: Payer: Self-pay | Admitting: Hematology and Oncology

## 2017-07-14 NOTE — Telephone Encounter (Signed)
Left message for daughter regarding upcoming April appointments.

## 2017-07-14 NOTE — Telephone Encounter (Signed)
Oral Oncology Patient Advocate Encounter  Received notification from [place] Patient Assistance program that patient has been successfully enrolled into their program to receive Ibrance from the manufacturer at $0 out of pocket until 07/15/2018.   [employer] has already spoken with the patient's daughter to arrange for shipment of the medication.     Patient knows to call the office with questions or concerns.  Oral Oncology Clinic will continue to follow.  Gilmore Laroche, CPhT, [place] Oral Oncology Patient Advocate [phone] 07/14/2017 1:41 PM

## 2017-07-15 ENCOUNTER — Other Ambulatory Visit: Payer: Self-pay | Admitting: Family Medicine

## 2017-07-15 MED ORDER — LISINOPRIL 10 MG PO TABS: 10.0000 mg | ORAL_TABLET | Freq: Every day | ORAL | 3 refills | Status: DC

## 2017-07-15 NOTE — Progress Notes (Unsigned)
Please inform the patient to increase her lisinopril from 5 g to 10 mg; her oncologist is also aware of this decision.

## 2017-07-16 ENCOUNTER — Telehealth: Payer: Self-pay

## 2017-07-16 ENCOUNTER — Ambulatory Visit (HOSPITAL_COMMUNITY): Payer: Self-pay

## 2017-07-16 NOTE — Telephone Encounter (Signed)
Patient was called and informed to increase BP medication.[phone])

## 2017-07-27 ENCOUNTER — Inpatient Hospital Stay (HOSPITAL_BASED_OUTPATIENT_CLINIC_OR_DEPARTMENT_OTHER): Payer: Self-pay | Admitting: Hematology and Oncology

## 2017-07-27 ENCOUNTER — Inpatient Hospital Stay: Payer: Self-pay | Attending: Hematology and Oncology

## 2017-07-27 ENCOUNTER — Telehealth: Payer: Self-pay | Admitting: Hematology and Oncology

## 2017-07-27 VITALS — BP 171/80 | HR 58 | Temp 97.6°F | Resp 17 | Ht 60.0 in | Wt 213.2 lb

## 2017-07-27 DIAGNOSIS — Z170 Estrogen receptor positive status [ER+]: Secondary | ICD-10-CM | POA: Insufficient documentation

## 2017-07-27 DIAGNOSIS — C7800 Secondary malignant neoplasm of unspecified lung: Secondary | ICD-10-CM | POA: Insufficient documentation

## 2017-07-27 DIAGNOSIS — C50011 Malignant neoplasm of nipple and areola, right female breast: Secondary | ICD-10-CM

## 2017-07-27 DIAGNOSIS — Z9013 Acquired absence of bilateral breasts and nipples: Secondary | ICD-10-CM | POA: Insufficient documentation

## 2017-07-27 DIAGNOSIS — G620 Drug-induced polyneuropathy: Secondary | ICD-10-CM

## 2017-07-27 DIAGNOSIS — Z79811 Long term (current) use of aromatase inhibitors: Secondary | ICD-10-CM | POA: Insufficient documentation

## 2017-07-27 DIAGNOSIS — C773 Secondary and unspecified malignant neoplasm of axilla and upper limb lymph nodes: Secondary | ICD-10-CM | POA: Insufficient documentation

## 2017-07-27 DIAGNOSIS — Z9221 Personal history of antineoplastic chemotherapy: Secondary | ICD-10-CM | POA: Insufficient documentation

## 2017-07-27 DIAGNOSIS — C50012 Malignant neoplasm of nipple and areola, left female breast: Secondary | ICD-10-CM

## 2017-07-27 DIAGNOSIS — Z923 Personal history of irradiation: Secondary | ICD-10-CM

## 2017-07-27 DIAGNOSIS — T451X5A Adverse effect of antineoplastic and immunosuppressive drugs, initial encounter: Principal | ICD-10-CM

## 2017-07-27 LAB — CMP (CANCER CENTER ONLY)
ALT: 30 U/L (ref 0–55)
ANION GAP: 7 (ref 3–11)
AST: 23 U/L (ref 5–34)
Albumin: 3.8 g/dL (ref 3.5–5.0)
Alkaline Phosphatase: 70 U/L (ref 40–150)
BILIRUBIN TOTAL: 0.6 mg/dL (ref 0.2–1.2)
BUN: 9 mg/dL (ref 7–26)
CHLORIDE: 105 mmol/L (ref 98–109)
CO2: 28 mmol/L (ref 22–29)
Calcium: 9.3 mg/dL (ref 8.4–10.4)
Creatinine: 0.76 mg/dL (ref 0.60–1.10)
Glucose, Bld: 96 mg/dL (ref 70–140)
POTASSIUM: 3.9 mmol/L (ref 3.5–5.1)
Sodium: 140 mmol/L (ref 136–145)
TOTAL PROTEIN: 7.1 g/dL (ref 6.4–8.3)

## 2017-07-27 LAB — CBC WITH DIFFERENTIAL (CANCER CENTER ONLY)
BASOS ABS: 0 10*3/uL (ref 0.0–0.1)
Basophils Relative: 0 %
Eosinophils Absolute: 0 10*3/uL (ref 0.0–0.5)
Eosinophils Relative: 1 %
HEMATOCRIT: 37.5 % (ref 34.8–46.6)
Hemoglobin: 12.4 g/dL (ref 11.6–15.9)
LYMPHS ABS: 1.4 10*3/uL (ref 0.9–3.3)
LYMPHS PCT: 45 %
MCH: 29.2 pg (ref 25.1–34.0)
MCHC: 33.1 g/dL (ref 31.5–36.0)
MCV: 88.4 fL (ref 79.5–101.0)
Monocytes Absolute: 0.1 10*3/uL (ref 0.1–0.9)
Monocytes Relative: 4 %
NEUTROS ABS: 1.6 10*3/uL (ref 1.5–6.5)
NEUTROS PCT: 50 %
PLATELETS: 145 10*3/uL (ref 145–400)
RBC: 4.24 MIL/uL (ref 3.70–5.45)
RDW: 13.4 % (ref 11.2–14.5)
WBC: 3.1 10*3/uL — AB (ref 3.9–10.3)

## 2017-07-27 MED ORDER — LIDOCAINE-PRILOCAINE 2.5-2.5 % EX CREA: 1.0000 "application " | TOPICAL_CREAM | CUTANEOUS | 6 refills | Status: AC | PRN

## 2017-07-27 NOTE — Assessment & Plan Note (Signed)
Treatment Summary: 1. Neoadjuvant chemotherapy with either AC followed by THP started 09/13/2015 and completed 12 weeks of Taxol 11/29/2015, Herceptin Perjeta 6; now on Herceptin  2. Bilateral mastectomies and lymph node surgery 02/11/16 3. Adjuvant radiation 04/07/2016 to 05/15/2016 4. Adjuvant antiestrogen therapy with Letrozole started 06/26/2016 5. 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 Herceptin maintenance completed July 2018  Current treatment:  1. Adjuvant antiestrogen therapy with letrozole 2.5 mg daily started 06/26/2016 2. Neratinib 240 mg daily started 11/20/2016 discontinued 07/13/2017 3.  Ibrance with letrozole started 07/13/2017  CT chest 07/13/2017: Progression of disease with increase in the size of the right and left hilar lymph nodes as well as the lung nodules in both her lungs  Current treatment: Ibrance with letrozole  Ibrance toxicities: 1.  Monitoring the neutrophil count  Patient denies any fatigue or nausea vomiting.  Return to clinic in 2 weeks with labs and follow-up.

## 2017-07-27 NOTE — Progress Notes (Signed)
Patient Care Team: [name], MD as PCP - General (Family Medicine) Nicholas Lose, MD as Consulting Physician (Hematology and Oncology) Delice Bison, Charlestine Massed, NP as Nurse Practitioner (Hematology and Oncology) Kyung Rudd, MD as Consulting Physician (Radiation Oncology) Rolm Bookbinder, MD as Consulting Physician (General Surgery)  DIAGNOSIS:  Encounter Diagnoses  Name Primary?  . 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 Malignant neoplasm metastatic to lung, unspecified laterality ([place])   . 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, estrogen receptor positive ([place])     SUMMARY OF ONCOLOGIC HISTORY:   Bilateral breast cancer ([place])   08/16/2015 Mammogram    Left breast LIQ 4.2 cm, 4 mm oval mass 12:00, 1.2 cm mass left UOQ, enlarged left axillary lymph node      08/16/2015 Mammogram    Right breast UIQ: 3.9 cm irregular mass, 1.5 cm mass in the right LOQ, 9 mm mass right UOQ, 6 mm mass right LIQ, enlarged lymph node Rt axilla      08/20/2015 Initial Diagnosis    Right breast biopsy 1:00: IDC grade 2 ER 95%, PR 2%, Ki 67 15%; HER-2 negative ratio 1.04; 10:00: IDC grade 2-3 dissimilar to 1:00 biopsy 10:00: ER 95%, PR 0%, K67: 25%, HER-2 positive ratio 6.42 right axillary lymph node positive for metastatic cancer      08/20/2015 Procedure    Left biopsy 8:00: IDC with DCIS, grade 2; ER 95%, PR 95%, Ki-67 30%, HER-2 negative ratio 1.2; 12:00: IDC with DCIS, grade 1 ER 95%, PR 95%, Ki-67 10% HER-2 negative ratio 1.14 not similar to this biopsy, left axillary lymph node positive,       09/13/2015 - 01/17/2016 Neo-Adjuvant Chemotherapy    Neoadjuvant dose dense Adriamycin and Cytoxan followed by Taxol Herceptin Perjeta, followed by Herceptin maintenance      01/21/2016 Breast MRI    Bilateral breast masses significantly decreased in size with mild residual NME, complete resolution of numerous other nodules in both breasts, bilateral axillary lymph  nodes and bilateral retropectoral lymph nodes decreased in size      02/11/2016 Surgery    Right mastectomy: IDC 2 nodules, 2.5, 1 cm, margins negative, 3/7 lymph nodes positive T2 N1a stage II B, ER 95%, PR 2%, HER-2 negative, Ki-67 15% RCB burden 3.1 and 2.9 class II      02/11/2016 Surgery    Left mastectomy: IDC 3.1 cm with DCIS, margins negative, 0/8 lymph nodes, T2 N0 stage II a, RCB burden 2.18, class II, ER 100%, PR 95%, HER-2 negative ratio 1.2, Ki-67 30%       04/07/2016 - 05/15/2016 Radiation Therapy    Adjuvant radiation      06/26/2016 - 07/13/2017 Anti-estrogen oral therapy    Letrozole 2.5 mg daily      11/20/2016 Miscellaneous    Neratinib 240 mg daily discontinued 07/13/2017 when she presented with metastatic disease to the lungs      07/13/2017 Relapse/Recurrence    Progression of right and left hilar lymph nodes and bilateral lung nodules      07/13/2017 -  Anti-estrogen oral therapy    Ibrance with letrozole       CHIEF COMPLIANT: Follow-up on Ibrance with letrozole  INTERVAL HISTORY: Felicia [name] is a 57 year old with above-mentioned history metastatic breast cancer who is currently on Ibrance with letrozole.  She tolerated Ibrance extremely well.  She denies any nausea vomiting or fatigue.  REVIEW OF SYSTEMS:   Constitutional: Denies fevers, chills or abnormal weight loss  Eyes: Denies blurriness of vision Ears, nose, mouth, throat, and face: Denies mucositis or sore throat Respiratory: Denies cough, dyspnea or wheezes Cardiovascular: Denies palpitation, chest discomfort Gastrointestinal:  Denies nausea, heartburn or change in bowel habits Skin: Denies abnormal skin rashes Lymphatics: Denies new lymphadenopathy or easy bruising Neurological:Denies numbness, tingling or new weaknesses Behavioral/Psych: Mood is stable, no new changes  Extremities: No lower extremity edema  All other systems were reviewed with the patient and are  negative.  I have reviewed the past medical history, past surgical history, social history and family history with the patient and they are unchanged from previous note.  ALLERGIES:  is allergic to no known allergies.  MEDICATIONS:  Current Outpatient Medications  Medication Sig Dispense Refill  . Calcium Carb-Cholecalciferol (CALCIUM 600+D) 600-800 MG-UNIT TABS Take 1 tablet by mouth daily.    . Cholecalciferol (VITAMIN D-3) 5000 units TABS Take 1 tablet by mouth daily.    Marland Kitchen letrozole (FEMARA) 2.5 MG tablet Take 1 tablet (2.5 mg total) by mouth daily. (Patient not taking: Reported on 06/16/2017) 90 tablet 3  . lidocaine-prilocaine (EMLA) cream Apply 1 application topically as needed (Apply 2- hours prior to access of port). 30 g 6  . lisinopril (PRINIVIL,ZESTRIL) 10 MG tablet Take 1 tablet (10 mg total) by mouth daily. 30 tablet 3  . loperamide (IMODIUM A-D) 2 MG tablet Take 1 tablet (2 mg total) by mouth 4 (four) times daily as needed for diarrhea or loose stools. 30 tablet 0  . palbociclib (IBRANCE) 125 MG capsule Take 1 capsule (125 mg total) by mouth daily with breakfast. Take whole with food. 21 capsule 3  . potassium chloride (K-DUR,KLOR-CON) 10 MEQ tablet Take 2 tablets (20 mEq total) by mouth once for 1 dose. 60 tablet 3   No current facility-administered medications for this visit.    Facility-Administered Medications Ordered in Other Visits  Medication Dose Route Frequency Provider Last Rate Last Dose  . sodium chloride flush (NS) 0.9 % injection 10 mL  10 mL Intravenous PRN Nicholas Lose, MD   10 mL at 04/14/17 0831    PHYSICAL EXAMINATION: ECOG PERFORMANCE STATUS: 1 - Symptomatic but completely ambulatory  Vitals:   07/27/17 0953  BP: (!) 171/80  Pulse: (!) 58  Resp: 17  Temp: 97.6 F (36.4 C)  SpO2: 100%   Filed Weights   07/27/17 0953  Weight: 213 lb 3.2 oz (96.7 kg)    GENERAL:alert, no distress and comfortable SKIN: skin color, texture, turgor are normal, no  rashes or significant lesions EYES: normal, Conjunctiva are pink and non-injected, sclera clear OROPHARYNX:no exudate, no erythema and lips, buccal mucosa, and tongue normal  NECK: supple, thyroid normal size, non-tender, without nodularity LYMPH:  no palpable lymphadenopathy in the cervical, axillary or inguinal LUNGS: clear to auscultation and percussion with normal breathing effort HEART: regular rate & rhythm and no murmurs and no lower extremity edema ABDOMEN:abdomen soft, non-tender and normal bowel sounds MUSCULOSKELETAL:no cyanosis of digits and no clubbing  NEURO: alert & oriented x 3 with fluent speech, no focal motor/sensory deficits EXTREMITIES: No lower extremity edema   LABORATORY DATA:  I have reviewed the data as listed CMP Latest Ref Rng & Units 07/27/2017 07/13/2017 04/14/2017  Glucose 70 - 140 mg/dL 96 101 100  BUN 7 - 26 mg/dL 9 10 12.5  Creatinine 0.60 - 1.10 mg/dL 0.76 0.71 0.7  Sodium 136 - 145 mmol/L 140 137 137  Potassium 3.5 - 5.1 mmol/L 3.9 3.8 3.7  Chloride 98 -  109 mmol/L 105 105 -  CO2 22 - 29 mmol/L _0 Calcium 8.4 - 10.4 mg/dL 9.3 9.7 9.5  Total Protein 6.4 - 8.3 g/dL 7.1 7.4 7.8  Total Bilirubin 0.2 - 1.2 mg/dL 0.6 0.6 0.94  Alkaline Phos 40 - 150 U/L 70 76 74  AST 5 - 34 U/L _1 ALT 0 - 55 U/L 30 33 31    Lab Results  Component Value Date   WBC 3.1 (L) 07/27/2017   HGB 13.4 07/13/2017   HCT 37.5 07/27/2017   MCV 88.4 07/27/2017   PLT 145 07/27/2017   NEUTROABS 1.6 07/27/2017    ASSESSMENT & PLAN:  Bilateral breast cancer ([place]) Treatment Summary: 1. Neoadjuvant chemotherapy with either AC followed by THP started 09/13/2015 and completed 12 weeks of Taxol 11/29/2015, Herceptin Perjeta 6; now on Herceptin  2. Bilateral mastectomies and lymph node surgery 02/11/16 3. Adjuvant radiation 04/07/2016 to 05/15/2016 4. Adjuvant antiestrogen therapy with Letrozole started 06/26/2016 5. Neratinib after Herceptin maintenance discontinued  07/13/2017 when she developed metastatic disease  Bilateral Mastectomies 02/11/16 Right mastectomy: IDC 2 nodules, 2.5, 1 cm, margins negative, 3/7 lymph nodes positive T2 N1a stage II B, ER 95%, PR 2%, HER-2 negative, Ki-67 15% RCB burden 3.1 and 2.9 class II  Left mastectomy: IDC 3.1 cm with DCIS, margins negative, 0/8 lymph nodes, T2 N0 stage II a, RCB burden 2.18, class II, ER 100%, PR 95%, HER-2 negative ratio 1.2, Ki-67 30%  Chemotherapy-induced peripheral neuropathy: Grade 1-2, OnNeurontin. Herceptin maintenance completed July 2018  Current treatment:  1. Adjuvant antiestrogen therapy with letrozole 2.5 mg daily started 06/26/2016 2. Neratinib 240 mg daily started 11/20/2016 discontinued 07/13/2017 3.  Ibrance with letrozole started 07/13/2017  CT chest 07/13/2017: Progression of disease with increase in the size of the right and left hilar lymph nodes as well as the lung nodules in both her lungs  Current treatment: Ibrance with letrozole  Ibrance toxicities: 1.  Monitoring the neutrophil count  Patient denies any fatigue or nausea vomiting.  Return to clinic in 2 weeks with labs and follow-up.   No orders of the defined types were placed in this encounter.  The patient has a good understanding of the overall plan. she agrees with it. she will call with any problems that may develop before the next visit here.   Harriette Ohara, MD 07/27/17

## 2017-07-27 NOTE — Telephone Encounter (Signed)
Gave avs and calendar ° °

## 2017-07-27 NOTE — Patient Instructions (Signed)

## 2017-08-10 ENCOUNTER — Ambulatory Visit: Payer: Self-pay | Admitting: Hematology and Oncology

## 2017-08-10 ENCOUNTER — Other Ambulatory Visit: Payer: Self-pay

## 2017-08-10 NOTE — Assessment & Plan Note (Signed)
Treatment Summary: 1. Neoadjuvant chemotherapy with either AC followed by THP started 09/13/2015 and completed 12 weeks of Taxol 11/29/2015, Herceptin Perjeta 6; now on Herceptin  2. Bilateral mastectomies and lymph node surgery 02/11/16 3. Adjuvant radiation 04/07/2016 to 05/15/2016 4. Adjuvant antiestrogen therapy with Letrozole started 06/26/2016 5. 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 Herceptin maintenance completed July 2018  Current treatment:  1. Adjuvant antiestrogen therapy with letrozole 2.5 mg daily started 06/26/2016 2. Neratinib 240 mg daily started 07/26/2018discontinued 07/13/2017 3.  Ibrance with letrozole started 07/13/2017  CT chest 07/13/2017:Progression of disease with increase in the size of the right and left hilar lymph nodes as well as the lung nodules in both herlungs  Current treatment: Ibrance with letrozole  Ibrance toxicities: 1.  Monitoring the neutrophil count  Patient denies any fatigue or nausea vomiting.  Return to clinic in 4 weeks with labs and follow-up.

## 2017-08-10 NOTE — Progress Notes (Signed)
Patient Care Team: [name], MD as PCP - General (Family Medicine) Nicholas Lose, MD as Consulting Physician (Hematology and Oncology) Delice Bison, Charlestine Massed, NP as Nurse Practitioner (Hematology and Oncology) Kyung Rudd, MD as Consulting Physician (Radiation Oncology) Rolm Bookbinder, MD as Consulting Physician (General Surgery)  DIAGNOSIS:  Encounter Diagnosis  Name Primary?  . 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, estrogen receptor positive ([place])     SUMMARY OF ONCOLOGIC HISTORY:   Bilateral breast cancer ([place])   08/16/2015 Mammogram    Left breast LIQ 4.2 cm, 4 mm oval mass 12:00, 1.2 cm mass left UOQ, enlarged left axillary lymph node      08/16/2015 Mammogram    Right breast UIQ: 3.9 cm irregular mass, 1.5 cm mass in the right LOQ, 9 mm mass right UOQ, 6 mm mass right LIQ, enlarged lymph node Rt axilla      08/20/2015 Initial Diagnosis    Right breast biopsy 1:00: IDC grade 2 ER 95%, PR 2%, Ki 67 15%; HER-2 negative ratio 1.04; 10:00: IDC grade 2-3 dissimilar to 1:00 biopsy 10:00: ER 95%, PR 0%, K67: 25%, HER-2 positive ratio 6.42 right axillary lymph node positive for metastatic cancer      08/20/2015 Procedure    Left biopsy 8:00: IDC with DCIS, grade 2; ER 95%, PR 95%, Ki-67 30%, HER-2 negative ratio 1.2; 12:00: IDC with DCIS, grade 1 ER 95%, PR 95%, Ki-67 10% HER-2 negative ratio 1.14 not similar to this biopsy, left axillary lymph node positive,       09/13/2015 - 01/17/2016 Neo-Adjuvant Chemotherapy    Neoadjuvant dose dense Adriamycin and Cytoxan followed by Taxol Herceptin Perjeta, followed by Herceptin maintenance      01/21/2016 Breast MRI    Bilateral breast masses significantly decreased in size with mild residual NME, complete resolution of numerous other nodules in both breasts, bilateral axillary lymph nodes and bilateral retropectoral lymph nodes decreased in size      02/11/2016 Surgery    Right mastectomy: IDC 2 nodules,  2.5, 1 cm, margins negative, 3/7 lymph nodes positive T2 N1a stage II B, ER 95%, PR 2%, HER-2 negative, Ki-67 15% RCB burden 3.1 and 2.9 class II      02/11/2016 Surgery    Left mastectomy: IDC 3.1 cm with DCIS, margins negative, 0/8 lymph nodes, T2 N0 stage II a, RCB burden 2.18, class II, ER 100%, PR 95%, HER-2 negative ratio 1.2, Ki-67 30%       04/07/2016 - 05/15/2016 Radiation Therapy    Adjuvant radiation      06/26/2016 - 07/13/2017 Anti-estrogen oral therapy    Letrozole 2.5 mg daily      11/20/2016 Miscellaneous    Neratinib 240 mg daily discontinued 07/13/2017 when she presented with metastatic disease to the lungs      07/13/2017 Relapse/Recurrence    Progression of right and left hilar lymph nodes and bilateral lung nodules      07/13/2017 -  Anti-estrogen oral therapy    Ibrance with letrozole       CHIEF COMPLIANT: Follow-up on Ibrance with letrozole  INTERVAL HISTORY: Felicia [name] is a 57 year old with above-mentioned history metastatic breast cancer currently on Ibrance with letrozole.  She started this treatment about a month ago.  She appears to be tolerating it fairly well.  Does not have any nausea vomiting.  She does have moderate fatigue.  She is here to recheck her blood work and follow-up.  Denies any fevers or chills.  REVIEW OF SYSTEMS:   Constitutional: Denies fevers, chills or abnormal weight loss Eyes: Denies blurriness of vision Ears, nose, mouth, throat, and face: Denies mucositis or sore throat Respiratory: Denies cough, dyspnea or wheezes Cardiovascular: Denies palpitation, chest discomfort Gastrointestinal:  Denies nausea, heartburn or change in bowel habits Skin: Denies abnormal skin rashes Lymphatics: Denies new lymphadenopathy or easy bruising Neurological:Denies numbness, tingling or new weaknesses Behavioral/Psych: Mood is stable, no new changes  Extremities: No lower extremity edema Breast:  denies any pain or lumps or  nodules in either breasts All other systems were reviewed with the patient and are negative.  I have reviewed the past medical history, past surgical history, social history and family history with the patient and they are unchanged from previous note.  ALLERGIES:  is allergic to no known allergies.  MEDICATIONS:  Current Outpatient Medications  Medication Sig Dispense Refill  . Calcium Carb-Cholecalciferol (CALCIUM 600+D) 600-800 MG-UNIT TABS Take 1 tablet by mouth daily.    . Cholecalciferol (VITAMIN D-3) 5000 units TABS Take 1 tablet by mouth daily.    Marland Kitchen letrozole (FEMARA) 2.5 MG tablet Take 1 tablet (2.5 mg total) by mouth daily. (Patient not taking: Reported on 06/16/2017) 90 tablet 3  . lidocaine-prilocaine (EMLA) cream Apply 1 application topically as needed (Apply 2- hours prior to access of port). 30 g 6  . lisinopril (PRINIVIL,ZESTRIL) 10 MG tablet Take 1 tablet (10 mg total) by mouth daily. 30 tablet 3  . loperamide (IMODIUM A-D) 2 MG tablet Take 1 tablet (2 mg total) by mouth 4 (four) times daily as needed for diarrhea or loose stools. 30 tablet 0  . magic mouthwash w/lidocaine SOLN Take 5 mLs by mouth 3 (three) times daily as needed for mouth pain. 100 mL 0  . palbociclib (IBRANCE) 125 MG capsule Take 1 capsule (125 mg total) by mouth daily with breakfast. Take whole with food. 21 capsule 3  . potassium chloride (K-DUR,KLOR-CON) 10 MEQ tablet Take 2 tablets (20 mEq total) by mouth once for 1 dose. 60 tablet 3   No current facility-administered medications for this visit.    Facility-Administered Medications Ordered in Other Visits  Medication Dose Route Frequency Provider Last Rate Last Dose  . sodium chloride flush (NS) 0.9 % injection 10 mL  10 mL Intravenous PRN Nicholas Lose, MD   10 mL at 04/14/17 0831    PHYSICAL EXAMINATION: ECOG PERFORMANCE STATUS: 1 - Symptomatic but completely ambulatory  Vitals:   08/11/17 0914  BP: (!) 156/85  Pulse: (!) 58  Resp: 17  Temp:  97.7 F (36.5 C)  SpO2: 100%   Filed Weights   08/11/17 0914  Weight: 210 lb 11.2 oz (95.6 kg)    GENERAL:alert, no distress and comfortable SKIN: skin color, texture, turgor are normal, no rashes or significant lesions EYES: normal, Conjunctiva are pink and non-injected, sclera clear OROPHARYNX:no exudate, no erythema and lips, buccal mucosa, and tongue normal  NECK: supple, thyroid normal size, non-tender, without nodularity LYMPH:  no palpable lymphadenopathy in the cervical, axillary or inguinal LUNGS: clear to auscultation and percussion with normal breathing effort HEART: regular rate & rhythm and no murmurs and no lower extremity edema ABDOMEN:abdomen soft, non-tender and normal bowel sounds MUSCULOSKELETAL:no cyanosis of digits and no clubbing  NEURO: alert & oriented x 3 with fluent speech, no focal motor/sensory deficits EXTREMITIES: No lower extremity edema   LABORATORY DATA:  I have reviewed the data as listed CMP Latest Ref Rng & Units 08/11/2017 07/27/2017 07/13/2017  Glucose 70 - 140 mg/dL 98 96 101  BUN 7 - 26 mg/dL '11 9 10  '$ Creatinine 0.60 - 1.10 mg/dL 0.73 0.76 0.71  Sodium 136 - 145 mmol/L 138 140 137  Potassium 3.5 - 5.1 mmol/L 3.9 3.9 3.8  Chloride 98 - 109 mmol/L 107 105 105  CO2 22 - 29 mmol/L '24 28 24  '$ Calcium 8.4 - 10.4 mg/dL 9.6 9.3 9.7  Total Protein 6.4 - 8.3 g/dL 7.6 7.1 7.4  Total Bilirubin 0.2 - 1.2 mg/dL 0.4 0.6 0.6  Alkaline Phos 40 - 150 U/L 90 70 76  AST 5 - 34 U/L '20 23 27  '$ ALT 0 - 55 U/L 26 30 33    Lab Results  Component Value Date   WBC 3.8 (L) 08/11/2017   HGB 13.0 08/11/2017   HCT 37.7 08/11/2017   MCV 88.7 08/11/2017   PLT 228 08/11/2017   NEUTROABS 1.7 08/11/2017    ASSESSMENT & PLAN:  Bilateral breast cancer ([place]) Treatment Summary: 1. Neoadjuvant chemotherapy with either AC followed by THP started 09/13/2015 and completed 12 weeks of Taxol 11/29/2015, Herceptin Perjeta 6; now on Herceptin  2. Bilateral mastectomies and  lymph node surgery 02/11/16 3. Adjuvant radiation 04/07/2016 to 05/15/2016 4. Adjuvant antiestrogen therapy with Letrozole started 06/26/2016 5. 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 Herceptin maintenance completed July 2018  Current treatment:  1. Adjuvant antiestrogen therapy with letrozole 2.5 mg daily started 06/26/2016 2. Neratinib 240 mg daily started 07/26/2018discontinued 07/13/2017 3.  Ibrance with letrozole started 07/13/2017  CT chest 07/13/2017:Progression of disease with increase in the size of the right and left hilar lymph nodes as well as the lung nodules in both herlungs  Current treatment: Ibrance with letrozole  Ibrance toxicities: 1.  Monitoring the neutrophil count.  Her ANC is 1.7 today. Had a decrease the dosage of potassium to 1 tablet a day.  Slight mouth sore: I gave a prescription for Magic mouthwash. Patient denies any fatigue or nausea vomiting.  Return to clinic in 4 weeks with labs and follow-up.      Orders Placed This Encounter  Procedures  . CBC with Differential (Cancer Center Only)    Standing Status:   Future    Standing Expiration Date:   08/12/2018  . CMP ([place] only)    Standing Status:   Future    Standing Expiration Date:   08/12/2018   The patient has a good understanding of the overall plan. she agrees with it. she will call with any problems that may develop before the next visit here.   Harriette Ohara,  MD 08/11/17

## 2017-08-11 ENCOUNTER — Inpatient Hospital Stay: Payer: Self-pay

## 2017-08-11 ENCOUNTER — Telehealth: Payer: Self-pay | Admitting: Hematology and Oncology

## 2017-08-11 ENCOUNTER — Inpatient Hospital Stay (HOSPITAL_BASED_OUTPATIENT_CLINIC_OR_DEPARTMENT_OTHER): Payer: Self-pay | Admitting: Hematology and Oncology

## 2017-08-11 DIAGNOSIS — C50012 Malignant neoplasm of nipple and areola, left female breast: Secondary | ICD-10-CM

## 2017-08-11 DIAGNOSIS — Z170 Estrogen receptor positive status [ER+]: Secondary | ICD-10-CM

## 2017-08-11 DIAGNOSIS — Z79811 Long term (current) use of aromatase inhibitors: Secondary | ICD-10-CM

## 2017-08-11 DIAGNOSIS — C50011 Malignant neoplasm of nipple and areola, right female breast: Secondary | ICD-10-CM

## 2017-08-11 DIAGNOSIS — C7800 Secondary malignant neoplasm of unspecified lung: Secondary | ICD-10-CM

## 2017-08-11 DIAGNOSIS — Z95828 Presence of other vascular implants and grafts: Secondary | ICD-10-CM

## 2017-08-11 DIAGNOSIS — C773 Secondary and unspecified malignant neoplasm of axilla and upper limb lymph nodes: Secondary | ICD-10-CM

## 2017-08-11 DIAGNOSIS — Z923 Personal history of irradiation: Secondary | ICD-10-CM

## 2017-08-11 DIAGNOSIS — Z9013 Acquired absence of bilateral breasts and nipples: Secondary | ICD-10-CM

## 2017-08-11 DIAGNOSIS — Z9221 Personal history of antineoplastic chemotherapy: Secondary | ICD-10-CM

## 2017-08-11 LAB — CBC WITH DIFFERENTIAL/PLATELET
BASOS ABS: 0 10*3/uL (ref 0.0–0.1)
Basophils Relative: 1 %
EOS ABS: 0 10*3/uL (ref 0.0–0.5)
EOS PCT: 0 %
HCT: 37.7 % (ref 34.8–46.6)
Hemoglobin: 13 g/dL (ref 11.6–15.9)
Lymphocytes Relative: 42 %
Lymphs Abs: 1.6 10*3/uL (ref 0.9–3.3)
MCH: 30.5 pg (ref 25.1–34.0)
MCHC: 34.4 g/dL (ref 31.5–36.0)
MCV: 88.7 fL (ref 79.5–101.0)
Monocytes Absolute: 0.5 10*3/uL (ref 0.1–0.9)
Monocytes Relative: 13 %
NEUTROS PCT: 44 %
Neutro Abs: 1.7 10*3/uL (ref 1.5–6.5)
Platelets: 228 10*3/uL (ref 145–400)
RBC: 4.25 MIL/uL (ref 3.70–5.45)
RDW: 15.6 % — ABNORMAL HIGH (ref 11.2–14.5)
WBC: 3.8 10*3/uL — AB (ref 3.9–10.3)

## 2017-08-11 LAB — COMPREHENSIVE METABOLIC PANEL
ALT: 26 U/L (ref 0–55)
AST: 20 U/L (ref 5–34)
Albumin: 3.9 g/dL (ref 3.5–5.0)
Alkaline Phosphatase: 90 U/L (ref 40–150)
Anion gap: 7 (ref 3–11)
BILIRUBIN TOTAL: 0.4 mg/dL (ref 0.2–1.2)
BUN: 11 mg/dL (ref 7–26)
CO2: 24 mmol/L (ref 22–29)
CREATININE: 0.73 mg/dL (ref 0.60–1.10)
Calcium: 9.6 mg/dL (ref 8.4–10.4)
Chloride: 107 mmol/L (ref 98–109)
Glucose, Bld: 98 mg/dL (ref 70–140)
POTASSIUM: 3.9 mmol/L (ref 3.5–5.1)
Sodium: 138 mmol/L (ref 136–145)
TOTAL PROTEIN: 7.6 g/dL (ref 6.4–8.3)

## 2017-08-11 MED ORDER — SODIUM CHLORIDE 0.9% FLUSH
10.0000 mL | INTRAVENOUS | Status: DC | PRN
  Administered 2017-08-11: 10 mL via INTRAVENOUS
  Filled 2017-08-11: qty 10

## 2017-08-11 MED ORDER — HEPARIN SOD (PORK) LOCK FLUSH 100 UNIT/ML IV SOLN
500.0000 [IU] | Freq: Once | INTRAVENOUS | Status: AC | PRN
  Administered 2017-08-11: 500 [IU] via INTRAVENOUS
  Filled 2017-08-11: qty 5

## 2017-08-11 MED ORDER — MAGIC MOUTHWASH W/LIDOCAINE: 5.0000 mL | Freq: Three times a day (TID) | ORAL | 0 refills | Status: DC | PRN

## 2017-08-11 NOTE — Telephone Encounter (Signed)
Gave avs and calendar ° °

## 2017-08-12 NOTE — Assessment & Plan Note (Signed)
Treatment Summary: 1. Neoadjuvant chemotherapy with either AC followed by THP started 09/13/2015 and completed 12 weeks of Taxol 11/29/2015, Herceptin Perjeta 6; now on Herceptin  2. Bilateral mastectomies and lymph node surgery 02/11/16 3. Adjuvant radiation 04/07/2016 to 05/15/2016 4. Adjuvant antiestrogen therapy with Letrozole started 06/26/2016 5. 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  Treatment summary:  1. Adjuvant antiestrogen therapy with letrozole 2.5 mg daily started 06/26/2016 2. Neratinib 240 mg daily started 07/26/2018discontinued 07/13/2017 3.Ibrance with letrozole started 07/13/2017  CT chest 07/13/2017:Progression of disease with increase in the size of the right and left hilar lymph nodes as well as the lung nodules in both herlungs  Current treatment: Ibrance with letrozole Ibrance toxicities: 1.Monitoring the neutrophil count.  Her ANC is 1.7 today. Had a decrease the dosage of potassium to 1 tablet a day.  Slight mouth sore: I gave a prescription for Magic mouthwash. Patient denies any fatigue or nausea vomiting.  Return to clinic in 4 weeks with labs and follow-up.

## 2017-08-12 NOTE — Progress Notes (Signed)
Patient Care Team: [name], MD as PCP - General (Family Medicine) Nicholas Lose, MD as Consulting Physician (Hematology and Oncology) Delice Bison, Charlestine Massed, NP as Nurse Practitioner (Hematology and Oncology) Kyung Rudd, MD as Consulting Physician (Radiation Oncology) Rolm Bookbinder, MD as Consulting Physician (General Surgery)  DIAGNOSIS:  Encounter Diagnosis  Name Primary?  . 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, estrogen receptor positive ([place])     SUMMARY OF ONCOLOGIC HISTORY:   Bilateral breast cancer ([place])   08/16/2015 Mammogram    Left breast LIQ 4.2 cm, 4 mm oval mass 12:00, 1.2 cm mass left UOQ, enlarged left axillary lymph node      08/16/2015 Mammogram    Right breast UIQ: 3.9 cm irregular mass, 1.5 cm mass in the right LOQ, 9 mm mass right UOQ, 6 mm mass right LIQ, enlarged lymph node Rt axilla      08/20/2015 Initial Diagnosis    Right breast biopsy 1:00: IDC grade 2 ER 95%, PR 2%, Ki 67 15%; HER-2 negative ratio 1.04; 10:00: IDC grade 2-3 dissimilar to 1:00 biopsy 10:00: ER 95%, PR 0%, K67: 25%, HER-2 positive ratio 6.42 right axillary lymph node positive for metastatic cancer      08/20/2015 Procedure    Left biopsy 8:00: IDC with DCIS, grade 2; ER 95%, PR 95%, Ki-67 30%, HER-2 negative ratio 1.2; 12:00: IDC with DCIS, grade 1 ER 95%, PR 95%, Ki-67 10% HER-2 negative ratio 1.14 not similar to this biopsy, left axillary lymph node positive,       09/13/2015 - 01/17/2016 Neo-Adjuvant Chemotherapy    Neoadjuvant dose dense Adriamycin and Cytoxan followed by Taxol Herceptin Perjeta, followed by Herceptin maintenance      01/21/2016 Breast MRI    Bilateral breast masses significantly decreased in size with mild residual NME, complete resolution of numerous other nodules in both breasts, bilateral axillary lymph nodes and bilateral retropectoral lymph nodes decreased in size      02/11/2016 Surgery    Right mastectomy: IDC 2 nodules,  2.5, 1 cm, margins negative, 3/7 lymph nodes positive T2 N1a stage II B, ER 95%, PR 2%, HER-2 negative, Ki-67 15% RCB burden 3.1 and 2.9 class II      02/11/2016 Surgery    Left mastectomy: IDC 3.1 cm with DCIS, margins negative, 0/8 lymph nodes, T2 N0 stage II a, RCB burden 2.18, class II, ER 100%, PR 95%, HER-2 negative ratio 1.2, Ki-67 30%       04/07/2016 - 05/15/2016 Radiation Therapy    Adjuvant radiation      06/26/2016 - 07/13/2017 Anti-estrogen oral therapy    Letrozole 2.5 mg daily      11/20/2016 Miscellaneous    Neratinib 240 mg daily discontinued 07/13/2017 when she presented with metastatic disease to the lungs      07/13/2017 Relapse/Recurrence    Progression of right and left hilar lymph nodes and bilateral lung nodules      07/13/2017 -  Anti-estrogen oral therapy    Ibrance with letrozole       CHIEF COMPLIANT: Follow-up on Ibrance and letrozole  INTERVAL HISTORY: Felicia [name] is a 57 year old lady with above-mentioned history metastatic breast cancer who is currently on Ibrance with letrozole.  She completed 2 months of therapy.  She has been tolerating extremely well.  Denies any nausea vomiting or fatigue.  Mild sore has resolved with Magic mouthwash  REVIEW OF SYSTEMS:   Constitutional: Denies fevers, chills or abnormal weight loss Eyes: Denies  blurriness of vision Ears, nose, mouth, throat, and face: Denies mucositis or sore throat Respiratory: Denies cough, dyspnea or wheezes Cardiovascular: Denies palpitation, chest discomfort Gastrointestinal:  Denies nausea, heartburn or change in bowel habits Skin: Denies abnormal skin rashes Lymphatics: Denies new lymphadenopathy or easy bruising Neurological:Denies numbness, tingling or new weaknesses Behavioral/Psych: Mood is stable, no new changes  Extremities: No lower extremity edema All other systems were reviewed with the patient and are negative.  I have reviewed the past medical history,  past surgical history, social history and family history with the patient and they are unchanged from previous note.  ALLERGIES:  is allergic to no known allergies.  MEDICATIONS:  Current Outpatient Medications  Medication Sig Dispense Refill  . Calcium Carb-Cholecalciferol (CALCIUM 600+D) 600-800 MG-UNIT TABS Take 1 tablet by mouth daily.    . Cholecalciferol (VITAMIN D-3) 5000 units TABS Take 1 tablet by mouth daily.    Marland Kitchen letrozole (FEMARA) 2.5 MG tablet Take 1 tablet (2.5 mg total) by mouth daily. (Patient not taking: Reported on 06/16/2017) 90 tablet 3  . lidocaine-prilocaine (EMLA) cream Apply 1 application topically as needed (Apply 2- hours prior to access of port). 30 g 6  . lisinopril (PRINIVIL,ZESTRIL) 10 MG tablet Take 1 tablet (10 mg total) by mouth daily. 30 tablet 3  . loperamide (IMODIUM A-D) 2 MG tablet Take 1 tablet (2 mg total) by mouth 4 (four) times daily as needed for diarrhea or loose stools. 30 tablet 0  . magic mouthwash w/lidocaine SOLN Take 5 mLs by mouth 3 (three) times daily as needed for mouth pain. 100 mL 0  . palbociclib (IBRANCE) 125 MG capsule Take 1 capsule (125 mg total) by mouth daily with breakfast. Take whole with food. 21 capsule 3  . potassium chloride (K-DUR,KLOR-CON) 10 MEQ tablet Take 2 tablets (20 mEq total) by mouth once for 1 dose. 60 tablet 3   No current facility-administered medications for this visit.    Facility-Administered Medications Ordered in Other Visits  Medication Dose Route Frequency Provider Last Rate Last Dose  . sodium chloride flush (NS) 0.9 % injection 10 mL  10 mL Intravenous PRN Nicholas Lose, MD   10 mL at 04/14/17 0831    PHYSICAL EXAMINATION: ECOG PERFORMANCE STATUS: 1 - Symptomatic but completely ambulatory  Vitals:   08/24/17 0951  BP: (!) 187/85  Pulse: (!) 58  Resp: 17  Temp: 97.8 F (36.6 C)  SpO2: 100%   Filed Weights   08/24/17 0951  Weight: 214 lb 11.2 oz (97.4 kg)    GENERAL:alert, no distress and  comfortable SKIN: skin color, texture, turgor are normal, no rashes or significant lesions EYES: normal, Conjunctiva are pink and non-injected, sclera clear OROPHARYNX:no exudate, no erythema and lips, buccal mucosa, and tongue normal  NECK: supple, thyroid normal size, non-tender, without nodularity LYMPH:  no palpable lymphadenopathy in the cervical, axillary or inguinal LUNGS: clear to auscultation and percussion with normal breathing effort HEART: regular rate & rhythm and no murmurs and no lower extremity edema ABDOMEN:abdomen soft, non-tender and normal bowel sounds MUSCULOSKELETAL:no cyanosis of digits and no clubbing  NEURO: alert & oriented x 3 with fluent speech, no focal motor/sensory deficits EXTREMITIES: No lower extremity edema  LABORATORY DATA:  I have reviewed the data as listed CMP Latest Ref Rng & Units 08/11/2017 07/27/2017 07/13/2017  Glucose 70 - 140 mg/dL 98 96 101  BUN 7 - 26 mg/dL _0 Creatinine 0.60 - 1.10 mg/dL 0.73 0.76 0.71  Sodium  136 - 145 mmol/L 138 140 137  Potassium 3.5 - 5.1 mmol/L 3.9 3.9 3.8  Chloride 98 - 109 mmol/L 107 105 105  CO2 22 - 29 mmol/L _0 Calcium 8.4 - 10.4 mg/dL 9.6 9.3 9.7  Total Protein 6.4 - 8.3 g/dL 7.6 7.1 7.4  Total Bilirubin 0.2 - 1.2 mg/dL 0.4 0.6 0.6  Alkaline Phos 40 - 150 U/L 90 70 76  AST 5 - 34 U/L _1 ALT 0 - 55 U/L 26 30 33    Lab Results  Component Value Date   WBC 3.0 (L) 08/24/2017   HGB 12.5 08/24/2017   HCT 37.3 08/24/2017   MCV 91.2 08/24/2017   PLT 251 08/24/2017   NEUTROABS 1.6 08/24/2017    ASSESSMENT & PLAN:  Bilateral breast cancer ([place]) Treatment Summary: 1. Neoadjuvant chemotherapy with either AC followed by THP started 09/13/2015 and completed 12 weeks of Taxol 11/29/2015, Herceptin Perjeta 6; now on Herceptin  2. Bilateral mastectomies and lymph node surgery 02/11/16 3. Adjuvant radiation 04/07/2016 to 05/15/2016 4. Adjuvant antiestrogen therapy with Letrozole started  06/26/2016 5. 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  Treatment summary:  1. Adjuvant antiestrogen therapy with letrozole 2.5 mg daily started 06/26/2016 2. Neratinib 240 mg daily started 07/26/2018discontinued 07/13/2017 3.Ibrance with letrozole started 07/13/2017  CT chest 07/13/2017:Progression of disease with increase in the size of the right and left hilar lymph nodes as well as the lung nodules in both herlungs  Current treatment: Ibrance with letrozole Ibrance toxicities: 1.Monitoring the neutrophil count.  Her ANC is 1.6 today.  Had a decrease the dosage of potassium to 1 tablet a day.  Slight mouth sore: I gave a prescription for Magic mouthwash. Patient denies any fatigue or nausea vomiting.  Return to clinic in 4 weeks with labs and follow-up. Plan is to do scans in June     No orders of the defined types were placed in this encounter.  The patient has a good understanding of the overall plan. she agrees with it. she will call with any problems that may develop before the next visit here.   Harriette Ohara, MD 08/24/17

## 2017-08-24 ENCOUNTER — Inpatient Hospital Stay: Payer: Self-pay

## 2017-08-24 ENCOUNTER — Telehealth: Payer: Self-pay | Admitting: Hematology and Oncology

## 2017-08-24 ENCOUNTER — Inpatient Hospital Stay (HOSPITAL_BASED_OUTPATIENT_CLINIC_OR_DEPARTMENT_OTHER): Payer: Self-pay | Admitting: Hematology and Oncology

## 2017-08-24 DIAGNOSIS — C773 Secondary and unspecified malignant neoplasm of axilla and upper limb lymph nodes: Secondary | ICD-10-CM

## 2017-08-24 DIAGNOSIS — Z9221 Personal history of antineoplastic chemotherapy: Secondary | ICD-10-CM

## 2017-08-24 DIAGNOSIS — C50011 Malignant neoplasm of nipple and areola, right female breast: Secondary | ICD-10-CM

## 2017-08-24 DIAGNOSIS — Z170 Estrogen receptor positive status [ER+]: Secondary | ICD-10-CM

## 2017-08-24 DIAGNOSIS — Z95828 Presence of other vascular implants and grafts: Secondary | ICD-10-CM

## 2017-08-24 DIAGNOSIS — C50012 Malignant neoplasm of nipple and areola, left female breast: Secondary | ICD-10-CM

## 2017-08-24 DIAGNOSIS — C7800 Secondary malignant neoplasm of unspecified lung: Secondary | ICD-10-CM

## 2017-08-24 DIAGNOSIS — Z9013 Acquired absence of bilateral breasts and nipples: Secondary | ICD-10-CM

## 2017-08-24 DIAGNOSIS — Z79811 Long term (current) use of aromatase inhibitors: Secondary | ICD-10-CM

## 2017-08-24 DIAGNOSIS — Z923 Personal history of irradiation: Secondary | ICD-10-CM

## 2017-08-24 LAB — CMP (CANCER CENTER ONLY)
ALBUMIN: 3.9 g/dL (ref 3.5–5.0)
ALT: 24 U/L (ref 0–55)
AST: 20 U/L (ref 5–34)
Alkaline Phosphatase: 67 U/L (ref 40–150)
Anion gap: 7 (ref 3–11)
BUN: 10 mg/dL (ref 7–26)
CHLORIDE: 105 mmol/L (ref 98–109)
CO2: 28 mmol/L (ref 22–29)
CREATININE: 0.73 mg/dL (ref 0.60–1.10)
Calcium: 9.1 mg/dL (ref 8.4–10.4)
GFR, Est AFR Am: >60 mL/min (ref >60)
GFR, Estimated: >60 mL/min (ref >60)
Glucose, Bld: 111 mg/dL (ref 70–140)
POTASSIUM: 3.7 mmol/L (ref 3.5–5.1)
SODIUM: 140 mmol/L (ref 136–145)
Total Bilirubin: 0.6 mg/dL (ref 0.2–1.2)
Total Protein: 7.2 g/dL (ref 6.4–8.3)

## 2017-08-24 LAB — CBC WITH DIFFERENTIAL (CANCER CENTER ONLY)
BASOS ABS: 0 10*3/uL (ref 0.0–0.1)
BASOS PCT: 1 %
EOS ABS: 0 10*3/uL (ref 0.0–0.5)
EOS PCT: 1 %
HCT: 37.3 % (ref 34.8–46.6)
Hemoglobin: 12.5 g/dL (ref 11.6–15.9)
LYMPHS PCT: 42 %
Lymphs Abs: 1.3 10*3/uL (ref 0.9–3.3)
MCH: 30.6 pg (ref 25.1–34.0)
MCHC: 33.5 g/dL (ref 31.5–36.0)
MCV: 91.2 fL (ref 79.5–101.0)
MONO ABS: 0.1 10*3/uL (ref 0.1–0.9)
Monocytes Relative: 3 %
Neutro Abs: 1.6 10*3/uL (ref 1.5–6.5)
Neutrophils Relative %: 53 %
PLATELETS: 251 10*3/uL (ref 145–400)
RBC: 4.09 MIL/uL (ref 3.70–5.45)
RDW: 16.3 % — AB (ref 11.2–14.5)
WBC: 3 10*3/uL — AB (ref 3.9–10.3)

## 2017-08-24 MED ORDER — HEPARIN SOD (PORK) LOCK FLUSH 100 UNIT/ML IV SOLN
500.0000 [IU] | Freq: Once | INTRAVENOUS | Status: AC | PRN
  Administered 2017-08-24: 500 [IU] via INTRAVENOUS
  Filled 2017-08-24: qty 5

## 2017-08-24 MED ORDER — SODIUM CHLORIDE 0.9% FLUSH
10.0000 mL | INTRAVENOUS | Status: DC | PRN
  Administered 2017-08-24: 10 mL via INTRAVENOUS
  Filled 2017-08-24: qty 10

## 2017-08-24 NOTE — Telephone Encounter (Signed)
Gave patient avs report and appointments for May via interpreter.

## 2017-09-02 ENCOUNTER — Other Ambulatory Visit: Payer: Self-pay

## 2017-09-02 MED ORDER — PALBOCICLIB 125 MG PO CAPS: 125.0000 mg | ORAL_CAPSULE | Freq: Every day | ORAL | 3 refills | Status: DC

## 2017-09-04 ENCOUNTER — Ambulatory Visit: Payer: Self-pay | Attending: Family Medicine | Admitting: Family Medicine

## 2017-09-04 ENCOUNTER — Other Ambulatory Visit: Payer: Self-pay | Admitting: *Deleted

## 2017-09-04 ENCOUNTER — Encounter: Payer: Self-pay | Admitting: Family Medicine

## 2017-09-04 VITALS — BP 166/91 | HR 62 | Temp 97.9°F | Ht 60.0 in | Wt 214.0 lb

## 2017-09-04 DIAGNOSIS — Z853 Personal history of malignant neoplasm of breast: Secondary | ICD-10-CM | POA: Insufficient documentation

## 2017-09-04 DIAGNOSIS — K219 Gastro-esophageal reflux disease without esophagitis: Secondary | ICD-10-CM | POA: Insufficient documentation

## 2017-09-04 DIAGNOSIS — Z923 Personal history of irradiation: Secondary | ICD-10-CM | POA: Insufficient documentation

## 2017-09-04 DIAGNOSIS — Z9221 Personal history of antineoplastic chemotherapy: Secondary | ICD-10-CM | POA: Insufficient documentation

## 2017-09-04 DIAGNOSIS — C50811 Malignant neoplasm of overlapping sites of right female breast: Secondary | ICD-10-CM

## 2017-09-04 DIAGNOSIS — I10 Essential (primary) hypertension: Secondary | ICD-10-CM

## 2017-09-04 DIAGNOSIS — C50812 Malignant neoplasm of overlapping sites of left female breast: Secondary | ICD-10-CM

## 2017-09-04 DIAGNOSIS — Z9013 Acquired absence of bilateral breasts and nipples: Secondary | ICD-10-CM | POA: Insufficient documentation

## 2017-09-04 DIAGNOSIS — Z79899 Other long term (current) drug therapy: Secondary | ICD-10-CM | POA: Insufficient documentation

## 2017-09-04 DIAGNOSIS — C7800 Secondary malignant neoplasm of unspecified lung: Secondary | ICD-10-CM | POA: Insufficient documentation

## 2017-09-04 MED ORDER — LISINOPRIL 20 MG PO TABS: 20.0000 mg | ORAL_TABLET | Freq: Every day | ORAL | 3 refills | Status: DC

## 2017-09-04 NOTE — Patient Instructions (Signed)

## 2017-09-04 NOTE — Progress Notes (Signed)
Subjective:  Patient ID: Felicia [name], female    DOB: 06-28-1960  [name]: 57 y.o. MRN: [id number]  CC: Hypertension   HPI Felicia [name] is 57 year old female with a history of GERD, bilateral breast cancer diagnosed in 07/2015 (status post bilateral mastectomy, chemotherapy and radiation), status post bilateral lymph node excision now on Ibrance and Letrozole who comes into the clinic accompanied by her daughter to follow up on Hypertension She endorses compliance with lisinopril but her blood pressure is elevated.  She denies shortness of breath, chest pains, pedal edema. Last visit with oncology was on 08/04/2017 and most recent CT chest had revealed progression of disease with increase in size of the right and left hila lymph node and lung nodules.  Plan is for repeat scan next month. She has no acute concerns today.  Past Medical History:  Diagnosis Date  . Cancer ([place])    breast  . GERD (gastroesophageal reflux disease)     Past Surgical History:  Procedure Laterality Date  . MASTECTOMY MODIFIED RADICAL Bilateral 02/11/2016  . MASTECTOMY MODIFIED RADICAL Bilateral 02/11/2016   Procedure: BILATERAL MASTECTOMY MODIFIED RADICAL;  Surgeon: Rolm Bookbinder, MD;  Location: [facility];  Service: General;  Laterality: Bilateral;  . PORTACATH PLACEMENT N/A 09/04/2015   Procedure: INSERTION PORT-A-CATH WITH [place];  Surgeon: Rolm Bookbinder, MD;  Location: [facility];  Service: General;  Laterality: N/A;  . TUBAL LIGATION      Allergies  Allergen Reactions  . No Known Allergies      Outpatient Medications Prior to Visit  Medication Sig Dispense Refill  . Calcium Carb-Cholecalciferol (CALCIUM 600+D) 600-800 MG-UNIT TABS Take 1 tablet by mouth daily.    . Cholecalciferol (VITAMIN D-3) 5000 units TABS Take 1 tablet by mouth daily.    Marland Kitchen letrozole (FEMARA) 2.5 MG tablet Take 1 tablet (2.5 mg total) by mouth daily. 90 tablet 3  .  lidocaine-prilocaine (EMLA) cream Apply 1 application topically as needed (Apply 2- hours prior to access of port). 30 g 6  . magic mouthwash w/lidocaine SOLN Take 5 mLs by mouth 3 (three) times daily as needed for mouth pain. 100 mL 0  . palbociclib (IBRANCE) 125 MG capsule Take 1 capsule (125 mg total) by mouth daily with breakfast. Take whole with food. 21 capsule 3  . lisinopril (PRINIVIL,ZESTRIL) 10 MG tablet Take 1 tablet (10 mg total) by mouth daily. 30 tablet 3  . loperamide (IMODIUM A-D) 2 MG tablet Take 1 tablet (2 mg total) by mouth 4 (four) times daily as needed for diarrhea or loose stools. (Patient not taking: Reported on 09/04/2017) 30 tablet 0  . potassium chloride (K-DUR,KLOR-CON) 10 MEQ tablet Take 2 tablets (20 mEq total) by mouth once for 1 dose. 60 tablet 3   Facility-Administered Medications Prior to Visit  Medication Dose Route Frequency Provider Last Rate Last Dose  . sodium chloride flush (NS) 0.9 % injection 10 mL  10 mL Intravenous PRN Nicholas Lose, MD   10 mL at 04/14/17 0831    ROS Review of Systems  Constitutional: Negative for activity change, appetite change and fatigue.  HENT: Negative for congestion, sinus pressure and sore throat.   Eyes: Negative for visual disturbance.  Respiratory: Negative for cough, chest tightness, shortness of breath and wheezing.   Cardiovascular: Negative for chest pain and palpitations.  Gastrointestinal: Negative for abdominal distention, abdominal pain and constipation.  Endocrine: Negative for polydipsia.  Genitourinary: Negative for dysuria and frequency.  Musculoskeletal: Negative for  arthralgias and back pain.  Skin: Negative for rash.  Neurological: Negative for tremors, light-headedness and numbness.  Hematological: Does not bruise/bleed easily.  Psychiatric/Behavioral: Negative for agitation and behavioral problems.    Objective:  BP (!) 166/91   Pulse 62   Temp 97.9 F (36.6 C) (Oral)   Ht 5' (1.524 m)   Wt 214  lb (97.1 kg)   SpO2 97%   BMI 41.79 kg/m   BP/Weight 09/04/2017 08/24/2017 5/62/1308  Systolic BP 657 846 962  Diastolic BP 91 85 85  Wt. (Lbs) 214 214.7 210.7  BMI 41.79 41.93 41.15      Physical Exam  Constitutional: She is oriented to person, place, and time. She appears well-developed and well-nourished.  Cardiovascular: Normal rate, normal heart sounds and intact distal pulses.  No murmur heard. Pulmonary/Chest: Effort normal and breath sounds normal. She has no wheezes. She has no rales. She exhibits no tenderness.  Abdominal: Soft. Bowel sounds are normal. She exhibits no distension and no mass. There is no tenderness.  Musculoskeletal: Normal range of motion.  Neurological: She is alert and oriented to person, place, and time.  Skin: Skin is warm and dry.  Psychiatric: She has a normal mood and affect.     CMP Latest Ref Rng & Units 08/24/2017 08/11/2017 07/27/2017  Glucose 70 - 140 mg/dL 111 98 96  BUN 7 - 26 mg/dL 10 11 9   Creatinine 0.60 - 1.10 mg/dL 0.73 0.73 0.76  Sodium 136 - 145 mmol/L 140 138 140  Potassium 3.5 - 5.1 mmol/L 3.7 3.9 3.9  Chloride 98 - 109 mmol/L 105 107 105  CO2 22 - 29 mmol/L 28 24 28   Calcium 8.4 - 10.4 mg/dL 9.1 9.6 9.3  Total Protein 6.4 - 8.3 g/dL 7.2 7.6 7.1  Total Bilirubin 0.2 - 1.2 mg/dL 0.6 0.4 0.6  Alkaline Phos 40 - 150 U/L 67 90 70  AST 5 - 34 U/L 20 20 23   ALT 0 - 55 U/L 24 26 30     Assessment & Plan:   1. Essential hypertension Uncontrolled Increased dose of Lisinopril Low sodium diet Needs potassium level checked as potassium dose might need to be reduced Has upcoming appointment for labs with oncology in 11 days - lisinopril (PRINIVIL,ZESTRIL) 20 MG tablet; Take 1 tablet (20 mg total) by mouth daily.  Dispense: 30 tablet; Refill: 3  2. Bilateral malignant neoplasm of overlapping sites of breast in female, unspecified estrogen receptor status ([place]) Ct chest with evidence of metastasis to the lungs Diagnosed in 07/2015    Status post bilateral mastectomy, status post bilateral lymph node excision  Status post radiation and chemo  now on Ibrance and Letrozole    Meds ordered this encounter  Medications  . lisinopril (PRINIVIL,ZESTRIL) 20 MG tablet    Sig: Take 1 tablet (20 mg total) by mouth daily.    Dispense:  30 tablet    Refill:  3    Discontinue previous dose    Follow-up: Return in about 3 months (around 12/05/2017) for follow up on Hypertension.   [name] MD

## 2017-09-07 ENCOUNTER — Telehealth: Payer: Self-pay | Admitting: Pharmacist

## 2017-09-07 DIAGNOSIS — C50011 Malignant neoplasm of nipple and areola, right female breast: Secondary | ICD-10-CM

## 2017-09-07 DIAGNOSIS — Z170 Estrogen receptor positive status [ER+]: Principal | ICD-10-CM

## 2017-09-07 DIAGNOSIS — C50012 Malignant neoplasm of nipple and areola, left female breast: Principal | ICD-10-CM

## 2017-09-07 MED ORDER — PALBOCICLIB 125 MG PO CAPS: 125.0000 mg | ORAL_CAPSULE | Freq: Every day | ORAL | 3 refills | Status: DC

## 2017-09-07 NOTE — Telephone Encounter (Signed)
Oral Oncology Pharmacist Encounter  Received call from daughter,. Olin Hauser, with request for refill Rx to be sent to appropriate pharmacy as patient needs to start next cycle today.  New prescription for Ibrance 125mg  capsules, 1 capsule (125mg ) by mouth once daily with breakfast for 21 days on, 7 days off, repeat every 28 days e-scribed to [facility]. This is the [employer] patient assistance program pharmacy. Ibrance refill sent to the [facility] on 09/02/17 in error.  Olin Hauser will have her mom start her next cycle as soon as it arrived.  They know to call the office with any additional questions or concerns.  Oral Oncology Clinic will continue to follow.  Johny Drilling, PharmD, BCPS, BCOP  09/07/2017   3:26 PM Oral Oncology Clinic [phone]

## 2017-09-10 ENCOUNTER — Ambulatory Visit: Payer: Self-pay | Admitting: Hematology and Oncology

## 2017-09-10 ENCOUNTER — Other Ambulatory Visit: Payer: Self-pay

## 2017-09-24 ENCOUNTER — Other Ambulatory Visit: Payer: Self-pay | Admitting: *Deleted

## 2017-09-24 DIAGNOSIS — Z95828 Presence of other vascular implants and grafts: Secondary | ICD-10-CM

## 2017-09-25 ENCOUNTER — Inpatient Hospital Stay: Payer: Self-pay

## 2017-09-25 ENCOUNTER — Inpatient Hospital Stay: Payer: Self-pay | Attending: Hematology and Oncology | Admitting: Hematology and Oncology

## 2017-09-25 ENCOUNTER — Telehealth: Payer: Self-pay | Admitting: Hematology and Oncology

## 2017-09-25 ENCOUNTER — Telehealth: Payer: Self-pay

## 2017-09-25 DIAGNOSIS — Z170 Estrogen receptor positive status [ER+]: Principal | ICD-10-CM

## 2017-09-25 DIAGNOSIS — Z95828 Presence of other vascular implants and grafts: Secondary | ICD-10-CM

## 2017-09-25 DIAGNOSIS — C50011 Malignant neoplasm of nipple and areola, right female breast: Secondary | ICD-10-CM | POA: Insufficient documentation

## 2017-09-25 DIAGNOSIS — C50012 Malignant neoplasm of nipple and areola, left female breast: Principal | ICD-10-CM

## 2017-09-25 DIAGNOSIS — Z79899 Other long term (current) drug therapy: Secondary | ICD-10-CM | POA: Insufficient documentation

## 2017-09-25 DIAGNOSIS — Z79811 Long term (current) use of aromatase inhibitors: Secondary | ICD-10-CM | POA: Insufficient documentation

## 2017-09-25 DIAGNOSIS — E876 Hypokalemia: Secondary | ICD-10-CM | POA: Insufficient documentation

## 2017-09-25 LAB — COMPREHENSIVE METABOLIC PANEL
ALBUMIN: 4.1 g/dL (ref 3.5–5.0)
ALT: 27 U/L (ref 0–55)
AST: 22 U/L (ref 5–34)
Alkaline Phosphatase: 63 U/L (ref 40–150)
Anion gap: 9 (ref 3–11)
BILIRUBIN TOTAL: 0.6 mg/dL (ref 0.2–1.2)
BUN: 14 mg/dL (ref 7–26)
CHLORIDE: 106 mmol/L (ref 98–109)
CO2: 23 mmol/L (ref 22–29)
CREATININE: 0.75 mg/dL (ref 0.60–1.10)
Calcium: 9.1 mg/dL (ref 8.4–10.4)
GFR calc Af Amer: >60 mL/min (ref >60)
GFR calc non Af Amer: >60 mL/min (ref >60)
GLUCOSE: 101 mg/dL (ref 70–140)
Potassium: 3.9 mmol/L (ref 3.5–5.1)
Sodium: 138 mmol/L (ref 136–145)
Total Protein: 7.2 g/dL (ref 6.4–8.3)

## 2017-09-25 LAB — CBC WITH DIFFERENTIAL/PLATELET
Basophils Absolute: 0 10*3/uL (ref 0.0–0.1)
Basophils Relative: 1 %
EOS ABS: 0 10*3/uL (ref 0.0–0.5)
EOS PCT: 1 %
HCT: 35.9 % (ref 34.8–46.6)
Hemoglobin: 12.6 g/dL (ref 11.6–15.9)
Lymphocytes Relative: 46 %
Lymphs Abs: 1.4 10*3/uL (ref 0.9–3.3)
MCH: 32.7 pg (ref 25.1–34.0)
MCHC: 35.1 g/dL (ref 31.5–36.0)
MCV: 93.2 fL (ref 79.5–101.0)
MONO ABS: 0.2 10*3/uL (ref 0.1–0.9)
Monocytes Relative: 5 %
Neutro Abs: 1.5 10*3/uL (ref 1.5–6.5)
Neutrophils Relative %: 47 %
PLATELETS: 182 10*3/uL (ref 145–400)
RBC: 3.85 MIL/uL (ref 3.70–5.45)
RDW: 17.1 % — AB (ref 11.2–14.5)
WBC: 3.1 10*3/uL — AB (ref 3.9–10.3)

## 2017-09-25 MED ORDER — POTASSIUM CHLORIDE CRYS ER 10 MEQ PO TBCR: 20.0000 meq | EXTENDED_RELEASE_TABLET | Freq: Once | ORAL | 3 refills | Status: DC

## 2017-09-25 MED ORDER — SODIUM CHLORIDE 0.9 % IJ SOLN
10.0000 mL | Freq: Once | INTRAMUSCULAR | Status: AC
  Administered 2017-09-25: 10 mL
  Filled 2017-09-25: qty 10

## 2017-09-25 MED ORDER — HEPARIN SOD (PORK) LOCK FLUSH 100 UNIT/ML IV SOLN
500.0000 [IU] | Freq: Once | INTRAVENOUS | Status: AC
  Administered 2017-09-25: 500 [IU]
  Filled 2017-09-25: qty 5

## 2017-09-25 NOTE — Telephone Encounter (Signed)
Gave patient AVs and calendar of upcoming June and July appointments.  °

## 2017-09-25 NOTE — Assessment & Plan Note (Signed)
Treatment Summary: 1. Neoadjuvant chemotherapy with either AC followed by THP started 09/13/2015 and completed 12 weeks of Taxol 11/29/2015, Herceptin Perjeta 6; now on Herceptin  2. Bilateral mastectomies and lymph node surgery 02/11/16 3. Adjuvant radiation 04/07/2016 to 05/15/2016 4. Adjuvant antiestrogen therapy with Letrozole started 06/26/2016 5. 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  CT chest 07/13/2017:Progression of disease with increase in the size of the right and left hilar lymph nodes as well as the lung nodules in both herlungs  Current treatment: Ibrance with letrozole Toxicities: 1. Neutropenia: Monitoring her white blood cell count. 2. Mouth sore  Return to clinic in 4 weeks with CT scans and follow-up with labs

## 2017-09-25 NOTE — Telephone Encounter (Signed)
Per Dr Lindi Adie, he has ordered some CTs in 4 weeks and once approved, he would like her CT appts arranged with 'port flush, lab, and CT' the day before the patient has office appt with him.  I will send an inbasket to [place] in managed care and once CTs prior auth approved then can call central scheduling for appt to arrange with her port flush and lab appt.

## 2017-09-25 NOTE — Progress Notes (Signed)
Patient Care Team: [name], MD as PCP - General (Family Medicine) Nicholas Lose, MD as Consulting Physician (Hematology and Oncology) Delice Bison, Charlestine Massed, NP as Nurse Practitioner (Hematology and Oncology) Kyung Rudd, MD as Consulting Physician (Radiation Oncology) Rolm Bookbinder, MD as Consulting Physician (General Surgery)  DIAGNOSIS:  Encounter Diagnoses  Name Primary?  . 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 Hypokalemia     SUMMARY OF ONCOLOGIC HISTORY:   Bilateral breast cancer ([place])   08/16/2015 Mammogram    Left breast LIQ 4.2 cm, 4 mm oval mass 12:00, 1.2 cm mass left UOQ, enlarged left axillary lymph node      08/16/2015 Mammogram    Right breast UIQ: 3.9 cm irregular mass, 1.5 cm mass in the right LOQ, 9 mm mass right UOQ, 6 mm mass right LIQ, enlarged lymph node Rt axilla      08/20/2015 Initial Diagnosis    Right breast biopsy 1:00: IDC grade 2 ER 95%, PR 2%, Ki 67 15%; HER-2 negative ratio 1.04; 10:00: IDC grade 2-3 dissimilar to 1:00 biopsy 10:00: ER 95%, PR 0%, K67: 25%, HER-2 positive ratio 6.42 right axillary lymph node positive for metastatic cancer      08/20/2015 Procedure    Left biopsy 8:00: IDC with DCIS, grade 2; ER 95%, PR 95%, Ki-67 30%, HER-2 negative ratio 1.2; 12:00: IDC with DCIS, grade 1 ER 95%, PR 95%, Ki-67 10% HER-2 negative ratio 1.14 not similar to this biopsy, left axillary lymph node positive,       09/13/2015 - 01/17/2016 Neo-Adjuvant Chemotherapy    Neoadjuvant dose dense Adriamycin and Cytoxan followed by Taxol Herceptin Perjeta, followed by Herceptin maintenance      01/21/2016 Breast MRI    Bilateral breast masses significantly decreased in size with mild residual NME, complete resolution of numerous other nodules in both breasts, bilateral axillary lymph nodes and bilateral retropectoral lymph nodes decreased in size      02/11/2016 Surgery    Right mastectomy:  IDC 2 nodules, 2.5, 1 cm, margins negative, 3/7 lymph nodes positive T2 N1a stage II B, ER 95%, PR 2%, HER-2 negative, Ki-67 15% RCB burden 3.1 and 2.9 class II      02/11/2016 Surgery    Left mastectomy: IDC 3.1 cm with DCIS, margins negative, 0/8 lymph nodes, T2 N0 stage II a, RCB burden 2.18, class II, ER 100%, PR 95%, HER-2 negative ratio 1.2, Ki-67 30%       04/07/2016 - 05/15/2016 Radiation Therapy    Adjuvant radiation      06/26/2016 - 07/13/2017 Anti-estrogen oral therapy    Letrozole 2.5 mg daily      11/20/2016 Miscellaneous    Neratinib 240 mg daily discontinued 07/13/2017 when she presented with metastatic disease to the lungs      07/13/2017 Relapse/Recurrence    Progression of right and left hilar lymph nodes and bilateral lung nodules      07/13/2017 -  Anti-estrogen oral therapy    Ibrance with letrozole       CHIEF COMPLIANT: Follow-up on Ibrance with letrozole  INTERVAL HISTORY: Felicia [name] is a 57 year old with above-mentioned history of metastatic breast cancer currently on Ibrance with letrozole.  She is here for blood count checkup.  She is tolerating Ibrance fairly well.  She has occasional mouth sores.  She denies any fevers or chills.  REVIEW OF SYSTEMS:   Constitutional: Denies fevers, chills or abnormal weight loss Eyes:  Denies blurriness of vision Ears, nose, mouth, throat, and face: Denies mucositis or sore throat Respiratory: Denies cough, dyspnea or wheezes Cardiovascular: Denies palpitation, chest discomfort Gastrointestinal:  Denies nausea, heartburn or change in bowel habits Skin: Denies abnormal skin rashes Lymphatics: Denies new lymphadenopathy or easy bruising Neurological:Denies numbness, tingling or new weaknesses Behavioral/Psych: Mood is stable, no new changes  Extremities: No lower extremity edema Breast: Bilateral mastectomies All other systems were reviewed with the patient and are negative.  I have reviewed the  past medical history, past surgical history, social history and family history with the patient and they are unchanged from previous note.  ALLERGIES:  is allergic to no known allergies.  MEDICATIONS:  Current Outpatient Medications  Medication Sig Dispense Refill  . Calcium Carb-Cholecalciferol (CALCIUM 600+D) 600-800 MG-UNIT TABS Take 1 tablet by mouth daily.    . Cholecalciferol (VITAMIN D-3) 5000 units TABS Take 1 tablet by mouth daily.    Marland Kitchen letrozole (FEMARA) 2.5 MG tablet Take 1 tablet (2.5 mg total) by mouth daily. 90 tablet 3  . lidocaine-prilocaine (EMLA) cream Apply 1 application topically as needed (Apply 2- hours prior to access of port). 30 g 6  . lisinopril (PRINIVIL,ZESTRIL) 20 MG tablet Take 1 tablet (20 mg total) by mouth daily. 30 tablet 3  . magic mouthwash w/lidocaine SOLN Take 5 mLs by mouth 3 (three) times daily as needed for mouth pain. 100 mL 0  . palbociclib (IBRANCE) 125 MG capsule Take 1 capsule (125 mg total) by mouth daily with breakfast. Take whole with food. 21 capsule 3  . potassium chloride (K-DUR,KLOR-CON) 10 MEQ tablet Take 2 tablets (20 mEq total) by mouth once for 1 dose. 60 tablet 3   No current facility-administered medications for this visit.    Facility-Administered Medications Ordered in Other Visits  Medication Dose Route Frequency Provider Last Rate Last Dose  . sodium chloride flush (NS) 0.9 % injection 10 mL  10 mL Intravenous PRN Nicholas Lose, MD   10 mL at 04/14/17 0831    PHYSICAL EXAMINATION: ECOG PERFORMANCE STATUS: 1 - Symptomatic but completely ambulatory  Vitals:   09/25/17 0919  BP: (!) 180/83  Pulse: 62  Resp: 17  Temp: 98 F (36.7 C)  SpO2: 98%   Filed Weights   09/25/17 0919  Weight: 215 lb 8 oz (97.8 kg)    GENERAL:alert, no distress and comfortable SKIN: skin color, texture, turgor are normal, no rashes or significant lesions EYES: normal, Conjunctiva are pink and non-injected, sclera clear OROPHARYNX:no exudate,  no erythema and lips, buccal mucosa, and tongue normal  NECK: supple, thyroid normal size, non-tender, without nodularity LYMPH:  no palpable lymphadenopathy in the cervical, axillary or inguinal LUNGS: clear to auscultation and percussion with normal breathing effort HEART: regular rate & rhythm and no murmurs and no lower extremity edema ABDOMEN:abdomen soft, non-tender and normal bowel sounds MUSCULOSKELETAL:no cyanosis of digits and no clubbing  NEURO: alert & oriented x 3 with fluent speech, no focal motor/sensory deficits EXTREMITIES: No lower extremity edema  LABORATORY DATA:  I have reviewed the data as listed CMP Latest Ref Rng & Units 08/24/2017 08/11/2017 07/27/2017  Glucose 70 - 140 mg/dL 111 98 96  BUN 7 - 26 mg/dL '10 11 9  '$ Creatinine 0.60 - 1.10 mg/dL 0.73 0.73 0.76  Sodium 136 - 145 mmol/L 140 138 140  Potassium 3.5 - 5.1 mmol/L 3.7 3.9 3.9  Chloride 98 - 109 mmol/L 105 107 105  CO2 22 - 29 mmol/L 28 24  28  Calcium 8.4 - 10.4 mg/dL 9.1 9.6 9.3  Total Protein 6.4 - 8.3 g/dL 7.2 7.6 7.1  Total Bilirubin 0.2 - 1.2 mg/dL 0.6 0.4 0.6  Alkaline Phos 40 - 150 U/L 67 90 70  AST 5 - 34 U/L '20 20 23  '$ ALT 0 - 55 U/L '24 26 30    '$ Lab Results  Component Value Date   WBC 3.1 (L) 09/25/2017   HGB 12.6 09/25/2017   HCT 35.9 09/25/2017   MCV 93.2 09/25/2017   PLT 182 09/25/2017   NEUTROABS 1.5 09/25/2017    ASSESSMENT & PLAN:  Bilateral breast cancer ([place]) Treatment Summary: 1. Neoadjuvant chemotherapy with either AC followed by THP started 09/13/2015 and completed 12 weeks of Taxol 11/29/2015, Herceptin Perjeta 6; now on Herceptin  2. Bilateral mastectomies and lymph node surgery 02/11/16 3. Adjuvant radiation 04/07/2016 to 05/15/2016 4. Adjuvant antiestrogen therapy with Letrozole started 06/26/2016 5. 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  CT chest 07/13/2017:Progression of disease with increase in the size of the right and left hilar lymph nodes as well as the lung nodules in both herlungs  Current treatment: Ibrance with letrozole Toxicities: 1. Neutropenia: Monitoring her white blood cell count. 2. Mouth sore  Return to clinic in 4 weeks with CT scans and follow-up with labs    Orders Placed This Encounter  Procedures  . CT Abdomen Pelvis W Contrast    Standing Status:   Future    Standing Expiration Date:   09/25/2018    Order Specific Question:   If indicated for the ordered procedure, I authorize the administration of contrast media per Radiology protocol    Answer:   Yes    Order Specific Question:   Is patient pregnant?    Answer:   No    Order Specific Question:   Preferred imaging location?    Answer:   [facility]    Order Specific Question:   Is Oral Contrast requested for this exam?    Answer:   Yes, Per Radiology protocol    Order Specific Question:   Radiology Contrast Protocol - do NOT remove file path    Answer:   [email or web address]    Order Specific Question:   Reason for Exam additional comments    Answer:   Met Breast cancer restaging  . CT Chest W Contrast    Standing Status:   Future    Standing Expiration Date:   09/25/2018    Order Specific Question:   If indicated for the ordered procedure, I authorize the administration of contrast media per Radiology protocol    Answer:   Yes    Order Specific Question:   Is patient pregnant?    Answer:   No    Order Specific Question:   Preferred imaging location?    Answer:   [facility]    Order Specific Question:   Radiology Contrast Protocol - do NOT remove file path    Answer:   [email or web address]    Order Specific Question:   Reason for Exam additional comments    Answer:   Met Breast cancer restaging   The patient has a good understanding of the overall  plan. she agrees with it. she will call with any problems that may develop before the next visit here.   Harriette Ohara, MD 09/25/17

## 2017-10-23 ENCOUNTER — Inpatient Hospital Stay: Payer: Self-pay | Attending: Hematology and Oncology

## 2017-10-23 ENCOUNTER — Ambulatory Visit (HOSPITAL_COMMUNITY)
Admission: RE | Admit: 2017-10-23 | Discharge: 2017-10-23 | Disposition: A | Discharge status: Home / Self Care | Payer: Self-pay | Source: Ambulatory Visit | Attending: Hematology and Oncology | Admitting: Hematology and Oncology

## 2017-10-23 ENCOUNTER — Inpatient Hospital Stay: Payer: Self-pay

## 2017-10-23 DIAGNOSIS — R918 Other nonspecific abnormal finding of lung field: Secondary | ICD-10-CM | POA: Insufficient documentation

## 2017-10-23 DIAGNOSIS — Z170 Estrogen receptor positive status [ER+]: Secondary | ICD-10-CM | POA: Insufficient documentation

## 2017-10-23 DIAGNOSIS — C50012 Malignant neoplasm of nipple and areola, left female breast: Secondary | ICD-10-CM | POA: Insufficient documentation

## 2017-10-23 DIAGNOSIS — Z95828 Presence of other vascular implants and grafts: Secondary | ICD-10-CM

## 2017-10-23 DIAGNOSIS — C50011 Malignant neoplasm of nipple and areola, right female breast: Secondary | ICD-10-CM | POA: Insufficient documentation

## 2017-10-23 DIAGNOSIS — Z79899 Other long term (current) drug therapy: Secondary | ICD-10-CM | POA: Insufficient documentation

## 2017-10-23 DIAGNOSIS — E876 Hypokalemia: Secondary | ICD-10-CM | POA: Insufficient documentation

## 2017-10-23 DIAGNOSIS — Z79811 Long term (current) use of aromatase inhibitors: Secondary | ICD-10-CM | POA: Insufficient documentation

## 2017-10-23 LAB — COMPREHENSIVE METABOLIC PANEL
ALBUMIN: 4.1 g/dL (ref 3.5–5.0)
ALK PHOS: 53 U/L (ref 38–126)
ALT: 25 U/L (ref 0–44)
AST: 25 U/L (ref 15–41)
Anion gap: 8 (ref 5–15)
BILIRUBIN TOTAL: 0.9 mg/dL (ref 0.3–1.2)
BUN: 13 mg/dL (ref 6–20)
CALCIUM: 8.8 mg/dL — AB (ref 8.9–10.3)
CO2: 26 mmol/L (ref 22–32)
Chloride: 107 mmol/L (ref 98–111)
Creatinine, Ser: 0.58 mg/dL (ref 0.44–1.00)
GFR calc Af Amer: >60 mL/min (ref >60)
GFR calc non Af Amer: >60 mL/min (ref >60)
GLUCOSE: 124 mg/dL — AB (ref 70–99)
POTASSIUM: 3.7 mmol/L (ref 3.5–5.1)
Sodium: 141 mmol/L (ref 135–145)
TOTAL PROTEIN: 7.1 g/dL (ref 6.5–8.1)

## 2017-10-23 LAB — CBC WITH DIFFERENTIAL (CANCER CENTER ONLY)
BASOS ABS: 0 10*3/uL (ref 0.0–0.1)
BASOS PCT: 1 %
Eosinophils Absolute: 0 10*3/uL (ref 0.0–0.5)
Eosinophils Relative: 1 %
HEMATOCRIT: 34.4 % — AB (ref 34.8–46.6)
HEMOGLOBIN: 11.8 g/dL (ref 11.6–15.9)
Lymphocytes Relative: 48 %
Lymphs Abs: 1.5 10*3/uL (ref 0.9–3.3)
MCH: 33.7 pg (ref 25.1–34.0)
MCHC: 34.3 g/dL (ref 31.5–36.0)
MCV: 98.3 fL (ref 79.5–101.0)
Monocytes Absolute: 0.2 10*3/uL (ref 0.1–0.9)
Monocytes Relative: 5 %
NEUTROS ABS: 1.4 10*3/uL — AB (ref 1.5–6.5)
NEUTROS PCT: 45 %
Platelet Count: 170 10*3/uL (ref 145–400)
RBC: 3.5 MIL/uL — AB (ref 3.70–5.45)
RDW: 15.9 % — ABNORMAL HIGH (ref 11.2–14.5)
WBC Count: 3 10*3/uL — ABNORMAL LOW (ref 3.9–10.3)

## 2017-10-23 MED ORDER — SODIUM CHLORIDE 0.9% FLUSH
10.0000 mL | INTRAVENOUS | Status: DC | PRN
  Administered 2017-10-23: 10 mL via INTRAVENOUS
  Filled 2017-10-23: qty 10

## 2017-10-23 MED ORDER — IOPAMIDOL (ISOVUE-300) INJECTION 61%
100.0000 mL | Freq: Once | INTRAVENOUS | Status: AC | PRN
  Administered 2017-10-23: 100 mL via INTRAVENOUS

## 2017-10-23 MED ORDER — IOPAMIDOL (ISOVUE-300) INJECTION 61%
INTRAVENOUS | Status: AC
  Filled 2017-10-23: qty 100

## 2017-10-23 MED ORDER — HEPARIN SOD (PORK) LOCK FLUSH 100 UNIT/ML IV SOLN
INTRAVENOUS | Status: AC
  Administered 2017-10-23: 500 [IU]
  Filled 2017-10-23: qty 5

## 2017-10-27 ENCOUNTER — Telehealth: Payer: Self-pay | Admitting: Hematology and Oncology

## 2017-10-27 ENCOUNTER — Inpatient Hospital Stay: Payer: Self-pay | Attending: Hematology and Oncology | Admitting: Hematology and Oncology

## 2017-10-27 VITALS — BP 153/75 | HR 61 | Temp 97.7°F | Resp 18 | Ht 60.0 in | Wt 215.9 lb

## 2017-10-27 DIAGNOSIS — C7800 Secondary malignant neoplasm of unspecified lung: Secondary | ICD-10-CM

## 2017-10-27 DIAGNOSIS — Z9013 Acquired absence of bilateral breasts and nipples: Secondary | ICD-10-CM | POA: Insufficient documentation

## 2017-10-27 DIAGNOSIS — C50011 Malignant neoplasm of nipple and areola, right female breast: Secondary | ICD-10-CM | POA: Insufficient documentation

## 2017-10-27 DIAGNOSIS — C50012 Malignant neoplasm of nipple and areola, left female breast: Secondary | ICD-10-CM | POA: Insufficient documentation

## 2017-10-27 DIAGNOSIS — Z9221 Personal history of antineoplastic chemotherapy: Secondary | ICD-10-CM | POA: Insufficient documentation

## 2017-10-27 DIAGNOSIS — Z79811 Long term (current) use of aromatase inhibitors: Secondary | ICD-10-CM | POA: Insufficient documentation

## 2017-10-27 DIAGNOSIS — Z170 Estrogen receptor positive status [ER+]: Secondary | ICD-10-CM | POA: Insufficient documentation

## 2017-10-27 NOTE — Assessment & Plan Note (Signed)
Treatment Summary: 1. Neoadjuvant chemotherapy with either AC followed by THP started 09/13/2015 and completed 12 weeks of Taxol 11/29/2015, Herceptin Perjeta 6; now on Herceptin  2. Bilateral mastectomies and lymph node surgery 02/11/16 3. Adjuvant radiation 04/07/2016 to 05/15/2016 4. Adjuvant antiestrogen therapy with Letrozole started 06/26/2016 5. 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  10/24/2017 CT chest abdomen pelvis: Interval decrease in the left hilar node.  The more superior left hilar node is slightly increased in size.  Interval decrease in the size of multiple lung nodules   Current treatment: Ibrance with letrozole Toxicities: 1. Neutropenia: Monitoring her white blood cell count. 2. Mouth sore  Return to clinic in 4 weeks for follow-up with labs

## 2017-10-27 NOTE — Telephone Encounter (Signed)
Patient avs and calendar of upcoming October appointments.

## 2017-10-27 NOTE — Progress Notes (Signed)
Patient Care Team: [name], MD as PCP - General (Family Medicine) Nicholas Lose, MD as Consulting Physician (Hematology and Oncology) Delice Bison, Charlestine Massed, NP as Nurse Practitioner (Hematology and Oncology) Kyung Rudd, MD as Consulting Physician (Radiation Oncology) Rolm Bookbinder, MD as Consulting Physician (General Surgery)  DIAGNOSIS:  Encounter Diagnoses  Name Primary?  . Malignant neoplasm metastatic to lung, unspecified laterality ([place]) Yes  . 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, estrogen receptor positive ([place])     SUMMARY OF ONCOLOGIC HISTORY:   Bilateral breast cancer ([place])   08/16/2015 Mammogram    Left breast LIQ 4.2 cm, 4 mm oval mass 12:00, 1.2 cm mass left UOQ, enlarged left axillary lymph node      08/16/2015 Mammogram    Right breast UIQ: 3.9 cm irregular mass, 1.5 cm mass in the right LOQ, 9 mm mass right UOQ, 6 mm mass right LIQ, enlarged lymph node Rt axilla      08/20/2015 Initial Diagnosis    Right breast biopsy 1:00: IDC grade 2 ER 95%, PR 2%, Ki 67 15%; HER-2 negative ratio 1.04; 10:00: IDC grade 2-3 dissimilar to 1:00 biopsy 10:00: ER 95%, PR 0%, K67: 25%, HER-2 positive ratio 6.42 right axillary lymph node positive for metastatic cancer      08/20/2015 Procedure    Left biopsy 8:00: IDC with DCIS, grade 2; ER 95%, PR 95%, Ki-67 30%, HER-2 negative ratio 1.2; 12:00: IDC with DCIS, grade 1 ER 95%, PR 95%, Ki-67 10% HER-2 negative ratio 1.14 not similar to this biopsy, left axillary lymph node positive,       09/13/2015 - 01/17/2016 Neo-Adjuvant Chemotherapy    Neoadjuvant dose dense Adriamycin and Cytoxan followed by Taxol Herceptin Perjeta, followed by Herceptin maintenance      01/21/2016 Breast MRI    Bilateral breast masses significantly decreased in size with mild residual NME, complete resolution of numerous other nodules in both breasts, bilateral axillary lymph nodes and bilateral retropectoral lymph nodes decreased  in size      02/11/2016 Surgery    Right mastectomy: IDC 2 nodules, 2.5, 1 cm, margins negative, 3/7 lymph nodes positive T2 N1a stage II B, ER 95%, PR 2%, HER-2 negative, Ki-67 15% RCB burden 3.1 and 2.9 class II      02/11/2016 Surgery    Left mastectomy: IDC 3.1 cm with DCIS, margins negative, 0/8 lymph nodes, T2 N0 stage II a, RCB burden 2.18, class II, ER 100%, PR 95%, HER-2 negative ratio 1.2, Ki-67 30%       04/07/2016 - 05/15/2016 Radiation Therapy    Adjuvant radiation      06/26/2016 - 07/13/2017 Anti-estrogen oral therapy    Letrozole 2.5 mg daily      11/20/2016 Miscellaneous    Neratinib 240 mg daily discontinued 07/13/2017 when she presented with metastatic disease to the lungs      07/13/2017 Relapse/Recurrence    Progression of right and left hilar lymph nodes and bilateral lung nodules      07/13/2017 -  Anti-estrogen oral therapy    Ibrance with letrozole      10/24/2017 Imaging    CT chest abdomen pelvis: Interval decrease in the left hilar node.  The more superior left hilar node is slightly increased in size.  Interval decrease in the size of multiple lung nodules       CHIEF COMPLIANT: Follow-up after recent CT scans  INTERVAL HISTORY: Felicia [name] is a patient with metastatic breast cancer currently  on Ibrance with letrozole.  She had recent CT scans and is here today to discuss the results.  She is tolerating Ibrance fairly well.  She has occasional mouth sores. Her scans done recently showed response to therapy  REVIEW OF SYSTEMS:   Constitutional: Denies fevers, chills or abnormal weight loss Eyes: Denies blurriness of vision Ears, nose, mouth, throat, and face: Denies mucositis or sore throat Respiratory: Denies cough, dyspnea or wheezes Cardiovascular: Denies palpitation, chest discomfort Gastrointestinal:  Denies nausea, heartburn or change in bowel habits Skin: Denies abnormal skin rashes Lymphatics: Denies new lymphadenopathy  or easy bruising Neurological:Denies numbness, tingling or new weaknesses Behavioral/Psych: Mood is stable, no new changes  Extremities: No lower extremity edema  All other systems were reviewed with the patient and are negative.  I have reviewed the past medical history, past surgical history, social history and family history with the patient and they are unchanged from previous note.  ALLERGIES:  is allergic to no known allergies.  MEDICATIONS:  Current Outpatient Medications  Medication Sig Dispense Refill  . Calcium Carb-Cholecalciferol (CALCIUM 600+D) 600-800 MG-UNIT TABS Take 1 tablet by mouth daily.    . Cholecalciferol (VITAMIN D-3) 5000 units TABS Take 1 tablet by mouth daily.    Marland Kitchen letrozole (FEMARA) 2.5 MG tablet Take 1 tablet (2.5 mg total) by mouth daily. 90 tablet 3  . lidocaine-prilocaine (EMLA) cream Apply 1 application topically as needed (Apply 2- hours prior to access of port). 30 g 6  . lisinopril (PRINIVIL,ZESTRIL) 20 MG tablet Take 1 tablet (20 mg total) by mouth daily. 30 tablet 3  . magic mouthwash w/lidocaine SOLN Take 5 mLs by mouth 3 (three) times daily as needed for mouth pain. 100 mL 0  . palbociclib (IBRANCE) 125 MG capsule Take 1 capsule (125 mg total) by mouth daily with breakfast. Take whole with food. 21 capsule 3  . potassium chloride (K-DUR,KLOR-CON) 10 MEQ tablet Take 2 tablets (20 mEq total) by mouth once for 1 dose. 60 tablet 3   No current facility-administered medications for this visit.    Facility-Administered Medications Ordered in Other Visits  Medication Dose Route Frequency Provider Last Rate Last Dose  . sodium chloride flush (NS) 0.9 % injection 10 mL  10 mL Intravenous PRN Nicholas Lose, MD   10 mL at 04/14/17 0831    PHYSICAL EXAMINATION: ECOG PERFORMANCE STATUS: 1 - Symptomatic but completely ambulatory  Vitals:   10/27/17 0841  BP: (!) 153/75  Pulse: 61  Resp: 18  Temp: 97.7 F (36.5 C)  SpO2: 100%   Filed Weights    10/27/17 0841  Weight: 215 lb 14.4 oz (97.9 kg)    GENERAL:alert, no distress and comfortable SKIN: skin color, texture, turgor are normal, no rashes or significant lesions EYES: normal, Conjunctiva are pink and non-injected, sclera clear OROPHARYNX:no exudate, no erythema and lips, buccal mucosa, and tongue normal  NECK: supple, thyroid normal size, non-tender, without nodularity LYMPH:  no palpable lymphadenopathy in the cervical, axillary or inguinal LUNGS: clear to auscultation and percussion with normal breathing effort HEART: regular rate & rhythm and no murmurs and no lower extremity edema ABDOMEN:abdomen soft, non-tender and normal bowel sounds MUSCULOSKELETAL:no cyanosis of digits and no clubbing  NEURO: alert & oriented x 3 with fluent speech, no focal motor/sensory deficits EXTREMITIES: No lower extremity edema  LABORATORY DATA:  I have reviewed the data as listed CMP Latest Ref Rng & Units 10/23/2017 09/25/2017 08/24/2017  Glucose 70 - 99 mg/dL 124(H) 101 111  BUN 6 - 20 mg/dL _0 Creatinine 0.44 - 1.00 mg/dL 0.58 0.75 0.73  Sodium 135 - 145 mmol/L 141 138 140  Potassium 3.5 - 5.1 mmol/L 3.7 3.9 3.7  Chloride 98 - 111 mmol/L 107 106 105  CO2 22 - 32 mmol/L _1 Calcium 8.9 - 10.3 mg/dL 8.8(L) 9.1 9.1  Total Protein 6.5 - 8.1 g/dL 7.1 7.2 7.2  Total Bilirubin 0.3 - 1.2 mg/dL 0.9 0.6 0.6  Alkaline Phos 38 - 126 U/L 53 63 67  AST 15 - 41 U/L _2 ALT 0 - 44 U/L _3 Lab Results  Component Value Date   WBC 3.0 (L) 10/23/2017   HGB 11.8 10/23/2017   HCT 34.4 (L) 10/23/2017   MCV 98.3 10/23/2017   PLT 170 10/23/2017   NEUTROABS 1.4 (L) 10/23/2017    ASSESSMENT & PLAN:  Bilateral breast cancer ([place]) Treatment Summary: 1. Neoadjuvant chemotherapy with either AC followed by THP started 09/13/2015 and completed 12 weeks of Taxol 11/29/2015, Herceptin Perjeta 6; now on Herceptin  2. Bilateral mastectomies and lymph node surgery 02/11/16 3.  Adjuvant radiation 04/07/2016 to 05/15/2016 4. Adjuvant antiestrogen therapy with Letrozole started 06/26/2016 5. 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  10/24/2017 CT chest abdomen pelvis: Interval decrease in the left hilar node.  The more superior left hilar node is slightly increased in size.  Interval decrease in the size of multiple lung nodules   Current treatment: Ibrance with letrozole Toxicities: 1. Neutropenia: Monitoring her white blood cell count. 2. Mouth sore  Return to clinic in 3 months for follow-up with labs.  Scans will be done every 6 months      Orders Placed This Encounter  Procedures  . CBC with Differential (Cancer Center Only)    Standing Status:   Future    Standing Expiration Date:   10/28/2018  . CMP ([place] only)    Standing Status:   Future    Standing Expiration Date:   10/28/2018   The patient has a good understanding of the overall plan. she agrees with it. she will call with any problems that may develop before the next visit here.   Harriette Ohara, MD 10/27/17

## 2017-12-02 ENCOUNTER — Other Ambulatory Visit: Payer: Self-pay | Admitting: Pharmacist

## 2017-12-08 ENCOUNTER — Encounter: Payer: Self-pay | Admitting: Family Medicine

## 2017-12-08 ENCOUNTER — Ambulatory Visit: Payer: Self-pay | Attending: Family Medicine | Admitting: Family Medicine

## 2017-12-08 VITALS — BP 140/90 | HR 60 | Temp 97.6°F | Ht 60.0 in | Wt 217.2 lb

## 2017-12-08 DIAGNOSIS — Z170 Estrogen receptor positive status [ER+]: Secondary | ICD-10-CM | POA: Insufficient documentation

## 2017-12-08 DIAGNOSIS — Z923 Personal history of irradiation: Secondary | ICD-10-CM | POA: Insufficient documentation

## 2017-12-08 DIAGNOSIS — Z9013 Acquired absence of bilateral breasts and nipples: Secondary | ICD-10-CM | POA: Insufficient documentation

## 2017-12-08 DIAGNOSIS — R05 Cough: Secondary | ICD-10-CM | POA: Insufficient documentation

## 2017-12-08 DIAGNOSIS — C50019 Malignant neoplasm of nipple and areola, unspecified female breast: Secondary | ICD-10-CM | POA: Insufficient documentation

## 2017-12-08 DIAGNOSIS — I10 Essential (primary) hypertension: Secondary | ICD-10-CM | POA: Insufficient documentation

## 2017-12-08 DIAGNOSIS — C50012 Malignant neoplasm of nipple and areola, left female breast: Secondary | ICD-10-CM

## 2017-12-08 DIAGNOSIS — C50011 Malignant neoplasm of nipple and areola, right female breast: Secondary | ICD-10-CM

## 2017-12-08 DIAGNOSIS — K219 Gastro-esophageal reflux disease without esophagitis: Secondary | ICD-10-CM | POA: Insufficient documentation

## 2017-12-08 DIAGNOSIS — Z9221 Personal history of antineoplastic chemotherapy: Secondary | ICD-10-CM | POA: Insufficient documentation

## 2017-12-08 DIAGNOSIS — Z853 Personal history of malignant neoplasm of breast: Secondary | ICD-10-CM | POA: Insufficient documentation

## 2017-12-08 DIAGNOSIS — Z79899 Other long term (current) drug therapy: Secondary | ICD-10-CM | POA: Insufficient documentation

## 2017-12-08 DIAGNOSIS — Z0000 Encounter for general adult medical examination without abnormal findings: Secondary | ICD-10-CM | POA: Insufficient documentation

## 2017-12-08 MED ORDER — LISINOPRIL 20 MG PO TABS: 20.0000 mg | ORAL_TABLET | Freq: Every day | ORAL | 6 refills | Status: DC

## 2017-12-08 NOTE — Progress Notes (Signed)
Subjective:  Patient ID: Felicia [name], female    DOB: 07/02/1960  [name]: 57 y.o. MRN: [id number]  CC: Hypertension   HPI Felicia [name] is 57 year old female with a history of GERD, bilateral breast cancer diagnosed in 07/2015 (status post bilateral mastectomy, chemotherapy and radiation), status post bilateral lymph node excision now on Ibrance and Letrozole who comes into the clinic accompanied by her daughter to follow up on Hypertension. Her blood pressure performed on the Dinamap was initially in the 591M systolic but repeat performed with a manual blood pressure cuff came back at 140/90.  She has been compliant with lisinopril and has a slight cough which is not bothersome.  He denies shortness of breath or chest pains. Last seen by oncology-Dr. Lindi [name] in 10/2017 with plans for scans every 6 months and follow-up every 3 months.  CT chest with contrast, CT abdomen and pelvis with contrast from 09/2017: IMPRESSION: 1. Interval decrease in size of left infrahilar lymph node. The more superiorly located left infrahilar lymph node is slightly increased in size. 2. Interval decrease in size of multiple pulmonary nodules particularly within the left lower lobe.  She has no acute concerns today.  Past Medical History:  Diagnosis Date  . Cancer ([place])    breast  . GERD (gastroesophageal reflux disease)     Past Surgical History:  Procedure Laterality Date  . MASTECTOMY MODIFIED RADICAL Bilateral 02/11/2016  . MASTECTOMY MODIFIED RADICAL Bilateral 02/11/2016   Procedure: BILATERAL MASTECTOMY MODIFIED RADICAL;  Surgeon: Felicia Bookbinder, MD;  Location: [facility];  Service: General;  Laterality: Bilateral;  . PORTACATH PLACEMENT N/A 09/04/2015   Procedure: INSERTION PORT-A-CATH WITH [place];  Surgeon: Felicia Bookbinder, MD;  Location: [facility];  Service: General;  Laterality: N/A;  . TUBAL LIGATION      Allergies  Allergen Reactions  . No Known  Allergies      Outpatient Medications Prior to Visit  Medication Sig Dispense Refill  . Calcium Carb-Cholecalciferol (CALCIUM 600+D) 600-800 MG-UNIT TABS Take 1 tablet by mouth daily.    . Cholecalciferol (VITAMIN D-3) 5000 units TABS Take 1 tablet by mouth daily.    Marland Kitchen letrozole (FEMARA) 2.5 MG tablet Take 1 tablet (2.5 mg total) by mouth daily. 90 tablet 3  . lidocaine-prilocaine (EMLA) cream Apply 1 application topically as needed (Apply 2- hours prior to access of port). 30 g 6  . magic mouthwash w/lidocaine SOLN Take 5 mLs by mouth 3 (three) times daily as needed for mouth pain. 100 mL 0  . palbociclib (IBRANCE) 125 MG capsule Take 1 capsule (125 mg total) by mouth daily with breakfast. Take whole with food. 21 capsule 3  . lisinopril (PRINIVIL,ZESTRIL) 20 MG tablet Take 1 tablet (20 mg total) by mouth daily. 30 tablet 3  . potassium chloride (K-DUR,KLOR-CON) 10 MEQ tablet Take 2 tablets (20 mEq total) by mouth once for 1 dose. 60 tablet 3   Facility-Administered Medications Prior to Visit  Medication Dose Route Frequency Provider Last Rate Last Dose  . sodium chloride flush (NS) 0.9 % injection 10 mL  10 mL Intravenous PRN Felicia Lose, MD   10 mL at 04/14/17 0831    ROS Review of Systems  Constitutional: Negative for activity change, appetite change and fatigue.  HENT: Negative for congestion, sinus pressure and sore throat.   Eyes: Negative for visual disturbance.  Respiratory: Negative for cough, chest tightness, shortness of breath and wheezing.   Cardiovascular: Negative for chest pain and palpitations.  Gastrointestinal: Negative for abdominal distention, abdominal pain and constipation.  Endocrine: Negative for polydipsia.  Genitourinary: Negative for dysuria and frequency.  Musculoskeletal: Negative for arthralgias and back pain.  Skin: Negative for rash.  Neurological: Negative for tremors, light-headedness and numbness.  Hematological: Does not bruise/bleed easily.    Psychiatric/Behavioral: Negative for agitation and behavioral problems.    Objective:   Vitals:   12/08/17 0947  BP: 140/90  Pulse: 60  Temp: 97.6 F (36.4 C)  TempSrc: Oral  SpO2: 97%  Weight: 217 lb 3.2 oz (98.5 kg)  Height: 5' (1.524 m)        Physical Exam  Constitutional: She is oriented to person, place, and time. She appears well-developed and well-nourished.  Cardiovascular: Normal rate, normal heart sounds and intact distal pulses.  No murmur heard. Pulmonary/Chest: Effort normal and breath sounds normal. She has no wheezes. She has no rales. She exhibits no tenderness.  Abdominal: Soft. Bowel sounds are normal. She exhibits no distension and no mass. There is no tenderness.  Musculoskeletal: Normal range of motion.  Neurological: She is alert and oriented to person, place, and time.  Skin: Skin is warm and dry.  Psychiatric: She has a normal mood and affect.     Assessment & Plan:   1. Essential hypertension Initially elevated via Dinamap but repeat came back normal Counseled on blood pressure goal of less than 130/80, low-sodium, DASH diet, medication compliance, 150 minutes of moderate intensity exercise per week. Discussed medication compliance, adverse effects. - lisinopril (PRINIVIL,ZESTRIL) 20 MG tablet; Take 1 tablet (20 mg total) by mouth daily.  Dispense: 30 tablet; Refill: 6  2. 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 Health care maintenance She is due for colonoscopy and Tdap but states this should be on hold for now as per oncology due to her multiple treatments   Meds ordered this encounter  Medications  . lisinopril (PRINIVIL,ZESTRIL) 20 MG tablet    Sig: Take 1 tablet (20 mg total) by mouth daily.    Dispense:  30 tablet    Refill:  6    Follow-up: Return in about 3 months (around 03/10/2018) for Follow-up of hypertension.   [name] MD

## 2017-12-08 NOTE — Patient Instructions (Signed)
 Cmo controlar su hipertensin Managing Your Hypertension La hipertensin se denomina usualmente presin arterial alta. Ocurre cuando la sangre presiona contra las paredes de las arterias con demasiada fuerza. Las arterias son los vasos sanguneos que transportan la sangre desde el corazn hacia todas las partes del cuerpo. La hipertensin hace que el corazn haga ms esfuerzo para bombear sangre y puede provocar que las arterias se estrechen o endurezcan. La hipertensin no tratada o no controlada puede causar infarto de miocardio, accidente cerebrovascular, enfermedad renal y otros problemas. Qu son las lecturas de presin arterial? Una lectura de la presin arterial consiste de un nmero ms alto sobre un nmero ms bajo. En condiciones ideales, la presin arterial debe estar por debajo de 120/80. El primer nmero ("superior") es la presin sistlica. Es la medida de la presin de las arterias cuando el corazn late. El segundo nmero ("inferior") es la presin diastlica. Es la medida de la presin en las arterias cuando el corazn se relaja. Qu significa mi lectura de presin arterial? La presin arterial se clasifica en cuatro etapas. Sobre la base de la lectura de su presin arterial, el mdico puede usar las siguientes etapas para determinar si necesita tratamiento y de qu tipo. La presin sistlica y la presin diastlica se miden en una unidad llamada mm Hg. Normal  Presin sistlica: por debajo de 120.  Presin diastlica: por debajo de 80. Elevada  Presin sistlica: 120-129.  Presin diastlica: por debajo de 80. Etapa 1 de hipertensin  Presin sistlica: 130-139.  Presin diastlica: 80-89. Etapa 2 de hipertensin  Presin sistlica: 140 o ms.  Presin diastlica: 90 o ms. Cules son los riesgos para la salud asociados con la hipertensin? Controlar la hipertensin es una responsabilidad importante. La hipertensin no controlada puede causar:  Infarto de  miocardio.  Accidente cerebrovascular.  Debilitamiento de los vasos sanguneos (aneurisma).  Insuficiencia cardaca.  Dao renal.  Dao ocular.  Sndrome metablico.  Problemas de memoria y concentracin.  Qu cambios puedo hacer para controlar mi hipertensin? La hipertensin se puede controlar haciendo cambios en el estilo de vida y, posiblemente, tomando medicamentos. Su mdico le ayudar a crear un plan para bajar la presin arterial al rango normal. Comida y bebida  Siga una dieta con alto contenido de fibras y potasio, y con bajo contenido de sal (sodio), azcar agregada y grasas. Un ejemplo de plan alimenticio es la dieta DASH (Dietary Approaches to Stop Hypertension, Mtodos alimenticios para detener la hipertensin). Para alimentarse de esta manera: ? Coma mucha fruta y verdura fresca. Trate de que la mitad del plato de cada comida sea de frutas y verduras. ? Coma cereales integrales, como pasta integral, arroz integral y pan integral. Llene aproximadamente un cuarto del plato con cereales integrales. ? Consuma productos lcteos con bajo contenido de grasa. ? Evite la ingesta de cortes de carne grasa, carne procesada o curada, y carne de ave con piel. Llene aproximadamente un cuarto del plato con protenas magras, como pescado, pollo sin piel, frijoles, huevos y tofu. ? Evite ingerir alimentos prehechos o procesados. En general, estos tienen mayor cantidad de sodio, azcar agregada y grasa.  Reduzca su ingesta diaria de sodio. La mayora de las personas que tienen hipertensin deben comer menos de 1500 mg de sodio por da.  Limite el consumo de alcohol a no ms de 1 medida por da si es mujer y no est embarazada y a 2 medidas por da si es hombre. Una medida equivale a 12onzas de cerveza, 5onzas de   vino o 1onzas de bebidas alcohlicas de alta graduacin. Estilo de vida  Trabaje con su mdico para mantener un peso saludable o perder peso. Pregntele cual es su peso  recomendado.  Realice al menos 30 minutos de ejercicio que haga que se acelere su corazn (ejercicio aerbico) la mayora de los das de la semana. Estas actividades pueden incluir caminar, nadar o andar en bicicleta.  Incluya ejercicios para fortalecer sus msculos (ejercicios de resistencia), como levantamiento de pesas, como parte de su rutina semanal de ejercicios. Intente realizar 30minutos de este tipo de ejercicios al menos tres das a la semana.  No consuma ningn producto que contenga nicotina o tabaco, como cigarrillos y cigarrillos electrnicos. Si necesita ayuda para dejar de fumar, consulte al mdico.  Controle las enfermedades a largo plazo (crnicas), como el colesterol alto o la diabetes. Control  Contrlese la presin arterial en su casa segn las indicaciones del mdico. La presin arterial deseada puede variar en funcin de las enfermedades, la edad y otros factores personales.  Contrlese la presin arterial de manera regular, en la frecuencia indicada por su mdico. Trabaje con su mdico  Revise con su mdico todos los medicamentos que toma ya que puede haber efectos secundarios o interacciones.  Hable con su mdico acerca de la dieta, hbitos de ejercicio y otros factores del estilo de vida que pueden contribuir a la hipertensin.  Consulte a su mdico regularmente. Su mdico puede ayudarle a crear y ajustar su plan para controlar la hipertensin. Debo tomar un medicamento para controlar mi presin arterial? El mdico puede recetarle medicamentos si los cambios en el estilo de vida no son suficientes para lograr controlar la presin arterial y si:  Su presin arterial sistlica es de 130 o ms.  Su presin arterial diastlica es de 80 o ms.  Tome los medicamentos solamente como se lo haya indicado el mdico. Siga cuidadosamente las indicaciones. Los medicamentos para la presin arterial deben tomarse segn las indicaciones. Los medicamentos pierden eficacia al  omitir las dosis. El hecho de omitir las dosis tambin aumenta el riesgo de otros problemas. Comunquese con un mdico si:  Piensa que tiene una reaccin alrgica a los medicamentos que ha tomado.  Tiene dolores de cabeza frecuentes (recurrentes).  Siente mareos.  Tiene hinchazn en los tobillos.  Tiene problemas de visin. Solicite ayuda de inmediato si:  Siente un dolor de cabeza intenso o confusin.  Siente debilidad inusual, adormecimiento o que se desmayar.  Siente un dolor intenso en el pecho o el abdomen.  Vomita repetidas veces.  Tiene dificultad para respirar. Resumen  La hipertensin se produce cuando la sangre bombea en las arterias con mucha fuerza. Si esta afeccin no se controla, podra correr riesgo de tener complicaciones graves.  La presin arterial deseada puede variar en funcin de las enfermedades, la edad y otros factores personales. Para la mayora de las personas, una presin arterial normal es menor que 120/80.  La hipertensin se puede controlar mediante cambios en el estilo de vida, tomando medicamentos, o ambas cosas. Los cambios en el estilo de vida incluyen prdida de peso, ingerir alimentos sanos, seguir una dieta baja en sodio, hacer ms ejercicio y limitar el consumo de alcohol. Esta informacin no tiene como fin reemplazar el consejo del mdico. Asegrese de hacerle al mdico cualquier pregunta que tenga. Document Released: 01/07/2012 Document Revised: 03/26/2016 Document Reviewed: 03/26/2016 Elsevier Interactive Patient Education  2018 [employer]  

## 2017-12-14 ENCOUNTER — Other Ambulatory Visit: Payer: Self-pay

## 2017-12-14 DIAGNOSIS — R221 Localized swelling, mass and lump, neck: Secondary | ICD-10-CM

## 2017-12-14 NOTE — Progress Notes (Addendum)
Pt interpreter Almyra Free) came to notify Dr.Gudena that she had been having right neck swelling, sob, and pain in her neck and port area when taking a deep breath over the last 2-3 days. Pt not on any anticoagulant but currently taking Ibrance and letrozole. Per Dr.Gudena, schedule pt for ultrasound of the neck and will check for possible clot. If negative, pt may be scheduled for future port flushes. Almyra Free confirmed time and date of appt and will relay message to pt.   Paschal Dopp to let pt know to go to ED if she starts to have, dizziness, increased pain with breathing, sudden weakness or severe headaaches.

## 2017-12-15 ENCOUNTER — Encounter (HOSPITAL_COMMUNITY): Payer: Self-pay

## 2017-12-15 ENCOUNTER — Telehealth: Payer: Self-pay

## 2017-12-15 ENCOUNTER — Ambulatory Visit (HOSPITAL_COMMUNITY)
Admission: RE | Admit: 2017-12-15 | Discharge: 2017-12-15 | Disposition: A | Discharge status: Home / Self Care | Payer: Self-pay | Source: Ambulatory Visit | Attending: Hematology and Oncology | Admitting: Hematology and Oncology

## 2017-12-15 ENCOUNTER — Ambulatory Visit (HOSPITAL_BASED_OUTPATIENT_CLINIC_OR_DEPARTMENT_OTHER)
Admission: RE | Admit: 2017-12-15 | Discharge: 2017-12-15 | Disposition: A | Discharge status: Home / Self Care | Payer: Self-pay | Source: Ambulatory Visit | Attending: Hematology and Oncology | Admitting: Hematology and Oncology

## 2017-12-15 ENCOUNTER — Other Ambulatory Visit: Payer: Self-pay

## 2017-12-15 ENCOUNTER — Inpatient Hospital Stay: Payer: Self-pay | Attending: Hematology and Oncology

## 2017-12-15 ENCOUNTER — Inpatient Hospital Stay (HOSPITAL_COMMUNITY): Admission: RE | Admit: 2017-12-15 | Payer: Self-pay | Source: Ambulatory Visit

## 2017-12-15 DIAGNOSIS — R0602 Shortness of breath: Secondary | ICD-10-CM

## 2017-12-15 DIAGNOSIS — R221 Localized swelling, mass and lump, neck: Secondary | ICD-10-CM

## 2017-12-15 DIAGNOSIS — R609 Edema, unspecified: Secondary | ICD-10-CM

## 2017-12-15 DIAGNOSIS — Z9221 Personal history of antineoplastic chemotherapy: Secondary | ICD-10-CM | POA: Insufficient documentation

## 2017-12-15 DIAGNOSIS — Z95828 Presence of other vascular implants and grafts: Secondary | ICD-10-CM

## 2017-12-15 DIAGNOSIS — C50012 Malignant neoplasm of nipple and areola, left female breast: Secondary | ICD-10-CM | POA: Insufficient documentation

## 2017-12-15 DIAGNOSIS — Z9013 Acquired absence of bilateral breasts and nipples: Secondary | ICD-10-CM | POA: Insufficient documentation

## 2017-12-15 DIAGNOSIS — Z79811 Long term (current) use of aromatase inhibitors: Secondary | ICD-10-CM | POA: Insufficient documentation

## 2017-12-15 DIAGNOSIS — Z170 Estrogen receptor positive status [ER+]: Secondary | ICD-10-CM | POA: Insufficient documentation

## 2017-12-15 DIAGNOSIS — Z452 Encounter for adjustment and management of vascular access device: Secondary | ICD-10-CM | POA: Insufficient documentation

## 2017-12-15 MED ORDER — SODIUM CHLORIDE 0.9% FLUSH
10.0000 mL | INTRAVENOUS | Status: DC | PRN
  Administered 2017-12-15: 10 mL via INTRAVENOUS
  Filled 2017-12-15: qty 10

## 2017-12-15 MED ORDER — HEPARIN SOD (PORK) LOCK FLUSH 100 UNIT/ML IV SOLN
500.0000 [IU] | Freq: Once | INTRAVENOUS | Status: AC | PRN
  Administered 2017-12-15: 500 [IU] via INTRAVENOUS
  Filled 2017-12-15: qty 5

## 2017-12-15 NOTE — Progress Notes (Signed)
RUE venous duplex prelim: negative for DVT and SVT. Landry Mellow, RDMS, RVT     Called results to May at Dr. Geralyn Flash office.

## 2017-12-15 NOTE — Telephone Encounter (Signed)
Received call from vascular lab that pt neck doppler was negative for DVT. Per Dr.Gudena, okay to have pt scheduled for port flush.

## 2017-12-15 NOTE — Patient Instructions (Signed)
Implanted Port Insertion  Implanted port insertion is a procedure to put in a port and catheter. The port is a device with an injectable disk that can be accessed by your health care provider. The port is connected to a vein in the chest or neck by a small flexible tube (catheter). There are different types of ports. The implanted port may be used as a long-term IV access for:  · Medicines, such as chemotherapy.  · Fluids.  · Liquid nutrition, such as total parenteral nutrition (TPN).  · Blood samples.    Having a port means that your health care provider will not need to use the veins in your arms for these procedures.  Tell a health care provider about:  · Any allergies you have.  · All medicines you are taking, especially blood thinners, as well as any vitamins, herbs, eye drops, creams, over-the-counter medicines, and steroids.  · Any problems you or family members have had with anesthetic medicines.  · Any blood disorders you have.  · Any surgeries you have had.  · Any medical conditions you have, including diabetes or kidney problems.  · Whether you are pregnant or may be pregnant.  What are the risks?  Generally, this is a safe procedure. However, problems may occur, including:  · Allergic reactions to medicines or dyes.  · Damage to other structures or organs.  · Infection.  · Damage to the blood vessel, bruising, or bleeding at the puncture site.  · Blood clot.  · Breakdown of the skin over the port.  · A collection of air in the chest that can cause one of the lungs to collapse (pneumothorax). This is rare.    What happens before the procedure?  Staying hydrated  Follow instructions from your health care provider about hydration, which may include:  · Up to 2 hours before the procedure - you may continue to drink clear liquids, such as water, clear fruit juice, black coffee, and plain tea.    Eating and drinking restrictions  · Follow instructions from your health care provider about eating and drinking,  which may include:  ? 8 hours before the procedure - stop eating heavy meals or foods such as meat, fried foods, or fatty foods.  ? 6 hours before the procedure - stop eating light meals or foods, such as toast or cereal.  ? 6 hours before the procedure - stop drinking milk or drinks that contain milk.  ? 2 hours before the procedure - stop drinking clear liquids.  Medicines  · Ask your health care provider about:  ? Changing or stopping your regular medicines. This is especially important if you are taking diabetes medicines or blood thinners.  ? Taking medicines such as aspirin and ibuprofen. These medicines can thin your blood. Do not take these medicines before your procedure if your health care provider instructs you not to.  · You may be given antibiotic medicine to help prevent infection.  General instructions  · Plan to have someone take you home from the hospital or clinic.  · If you will be going home right after the procedure, plan to have someone with you for 24 hours.  · You may have blood tests.  · You may be asked to shower with a germ-killing soap.  What happens during the procedure?  · To lower your risk of infection:  ? Your health care team will wash or sanitize their hands.  ? Your skin will be washed with   soap.  ? Hair may be removed from the surgical area.  · An IV tube will be inserted into one of your veins.  · You will be given one or more of the following:  ? A medicine to help you relax (sedative).  ? A medicine to numb the area (local anesthetic).  · Two small cuts (incisions) will be made to insert the port.  ? One incision will be made in your neck to get access to the vein where the catheter will lie.  ? The other incision will be made in the upper chest. This is where the port will lie.  · The procedure may be done using continuous X-ray (fluoroscopy) or other imaging tools for guidance.  · The port and catheter will be placed. There may be a small, raised area where the port  is.  · The port will be flushed with a salt solution (saline), and blood will be drawn to make sure that it is working correctly.  · The incisions will be closed.  · Bandages (dressings) may be placed over the incisions.  The procedure may vary among health care providers and hospitals.  What happens after the procedure?  · Your blood pressure, heart rate, breathing rate, and blood oxygen level will be monitored until the medicines you were given have worn off.  · Do not drive for 24 hours if you were given a sedative.  · You will be given a manufacturer's information card for the type of port that you have. Keep this with you.  · Your port will need to be flushed and checked as told by your health care provider, usually every few weeks.  · A chest X-ray will be done to:  ? Check the placement of the port.  ? Make sure there is no injury to your lung.  Summary  · Implanted port insertion is a procedure to put in a port and catheter.  · The implanted port is used as a long-term IV access.  · The port will need to be flushed and checked as told by your health care provider, usually every few weeks.  · Keep your manufacturer's information card with you at all times.  This information is not intended to replace advice given to you by your health care provider. Make sure you discuss any questions you have with your health care provider.  Document Released: 02/02/2013 Document Revised: 03/05/2016 Document Reviewed: 03/05/2016  Elsevier Interactive Patient Education © 2017 [employer]

## 2017-12-15 NOTE — Progress Notes (Signed)
Pt scheduled for stat vascular doppler of her R neck today.

## 2017-12-16 ENCOUNTER — Telehealth: Payer: Self-pay | Admitting: Hematology and Oncology

## 2017-12-16 NOTE — Telephone Encounter (Signed)
Left message for patient regarding upcoming sept appts per 8/20 sch message.

## 2017-12-23 ENCOUNTER — Other Ambulatory Visit: Payer: Self-pay | Admitting: Hematology and Oncology

## 2017-12-23 DIAGNOSIS — E876 Hypokalemia: Secondary | ICD-10-CM

## 2018-01-15 ENCOUNTER — Inpatient Hospital Stay: Payer: Self-pay | Attending: Hematology and Oncology

## 2018-01-15 DIAGNOSIS — C50012 Malignant neoplasm of nipple and areola, left female breast: Secondary | ICD-10-CM | POA: Insufficient documentation

## 2018-01-15 DIAGNOSIS — Z95828 Presence of other vascular implants and grafts: Secondary | ICD-10-CM

## 2018-01-15 DIAGNOSIS — Z452 Encounter for adjustment and management of vascular access device: Secondary | ICD-10-CM | POA: Insufficient documentation

## 2018-01-15 MED ORDER — HEPARIN SOD (PORK) LOCK FLUSH 100 UNIT/ML IV SOLN
500.0000 [IU] | Freq: Once | INTRAVENOUS | Status: AC | PRN
  Administered 2018-01-15: 500 [IU] via INTRAVENOUS
  Filled 2018-01-15: qty 5

## 2018-01-15 MED ORDER — SODIUM CHLORIDE 0.9% FLUSH
10.0000 mL | INTRAVENOUS | Status: DC | PRN
  Administered 2018-01-15: 10 mL via INTRAVENOUS
  Filled 2018-01-15: qty 10

## 2018-01-26 ENCOUNTER — Telehealth: Payer: Self-pay | Admitting: Hematology and Oncology

## 2018-01-26 ENCOUNTER — Inpatient Hospital Stay: Payer: Self-pay | Attending: Hematology and Oncology

## 2018-01-26 ENCOUNTER — Inpatient Hospital Stay: Payer: Self-pay | Admitting: Hematology and Oncology

## 2018-01-26 ENCOUNTER — Inpatient Hospital Stay: Payer: Self-pay

## 2018-01-26 ENCOUNTER — Inpatient Hospital Stay (HOSPITAL_BASED_OUTPATIENT_CLINIC_OR_DEPARTMENT_OTHER): Payer: Self-pay | Admitting: Hematology and Oncology

## 2018-01-26 DIAGNOSIS — D701 Agranulocytosis secondary to cancer chemotherapy: Secondary | ICD-10-CM

## 2018-01-26 DIAGNOSIS — C773 Secondary and unspecified malignant neoplasm of axilla and upper limb lymph nodes: Secondary | ICD-10-CM

## 2018-01-26 DIAGNOSIS — Z9013 Acquired absence of bilateral breasts and nipples: Secondary | ICD-10-CM

## 2018-01-26 DIAGNOSIS — C50012 Malignant neoplasm of nipple and areola, left female breast: Secondary | ICD-10-CM

## 2018-01-26 DIAGNOSIS — Z170 Estrogen receptor positive status [ER+]: Secondary | ICD-10-CM | POA: Insufficient documentation

## 2018-01-26 DIAGNOSIS — G620 Drug-induced polyneuropathy: Secondary | ICD-10-CM

## 2018-01-26 DIAGNOSIS — C7800 Secondary malignant neoplasm of unspecified lung: Secondary | ICD-10-CM

## 2018-01-26 DIAGNOSIS — Z79811 Long term (current) use of aromatase inhibitors: Secondary | ICD-10-CM

## 2018-01-26 DIAGNOSIS — Z923 Personal history of irradiation: Secondary | ICD-10-CM

## 2018-01-26 DIAGNOSIS — C50011 Malignant neoplasm of nipple and areola, right female breast: Secondary | ICD-10-CM

## 2018-01-26 DIAGNOSIS — Z9221 Personal history of antineoplastic chemotherapy: Secondary | ICD-10-CM | POA: Insufficient documentation

## 2018-01-26 DIAGNOSIS — Z79899 Other long term (current) drug therapy: Secondary | ICD-10-CM | POA: Insufficient documentation

## 2018-01-26 DIAGNOSIS — Z95828 Presence of other vascular implants and grafts: Secondary | ICD-10-CM

## 2018-01-26 DIAGNOSIS — T451X5D Adverse effect of antineoplastic and immunosuppressive drugs, subsequent encounter: Secondary | ICD-10-CM | POA: Insufficient documentation

## 2018-01-26 LAB — CBC WITH DIFFERENTIAL (CANCER CENTER ONLY)
Basophils Absolute: 0 10*3/uL (ref 0.0–0.1)
Basophils Relative: 1 %
Eosinophils Absolute: 0 10*3/uL (ref 0.0–0.5)
Eosinophils Relative: 1 %
HEMATOCRIT: 37.4 % (ref 34.8–46.6)
HEMOGLOBIN: 13 g/dL (ref 11.6–15.9)
LYMPHS ABS: 1.4 10*3/uL (ref 0.9–3.3)
Lymphocytes Relative: 41 %
MCH: 35.2 pg — AB (ref 25.1–34.0)
MCHC: 34.6 g/dL (ref 31.5–36.0)
MCV: 101.8 fL — ABNORMAL HIGH (ref 79.5–101.0)
MONOS PCT: 13 %
Monocytes Absolute: 0.5 10*3/uL (ref 0.1–0.9)
NEUTROS ABS: 1.5 10*3/uL (ref 1.5–6.5)
NEUTROS PCT: 44 %
Platelet Count: 169 10*3/uL (ref 145–400)
RBC: 3.68 MIL/uL — ABNORMAL LOW (ref 3.70–5.45)
RDW: 13.9 % (ref 11.2–14.5)
WBC Count: 3.4 10*3/uL — ABNORMAL LOW (ref 3.9–10.3)

## 2018-01-26 LAB — CMP (CANCER CENTER ONLY)
ALBUMIN: 4 g/dL (ref 3.5–5.0)
ALT: 35 U/L (ref 0–44)
ANION GAP: 8 (ref 5–15)
AST: 24 U/L (ref 15–41)
Alkaline Phosphatase: 62 U/L (ref 38–126)
BUN: 14 mg/dL (ref 6–20)
CHLORIDE: 107 mmol/L (ref 98–111)
CO2: 25 mmol/L (ref 22–32)
Calcium: 9.3 mg/dL (ref 8.9–10.3)
Creatinine: 0.71 mg/dL (ref 0.44–1.00)
GFR, Estimated: >60 mL/min (ref >60)
Glucose, Bld: 109 mg/dL — ABNORMAL HIGH (ref 70–99)
Potassium: 3.9 mmol/L (ref 3.5–5.1)
SODIUM: 140 mmol/L (ref 135–145)
Total Bilirubin: 0.6 mg/dL (ref 0.3–1.2)
Total Protein: 7.4 g/dL (ref 6.5–8.1)

## 2018-01-26 MED ORDER — SODIUM CHLORIDE 0.9% FLUSH
10.0000 mL | INTRAVENOUS | Status: DC | PRN
  Administered 2018-01-26: 10 mL via INTRAVENOUS
  Filled 2018-01-26: qty 10

## 2018-01-26 MED ORDER — HEPARIN SOD (PORK) LOCK FLUSH 100 UNIT/ML IV SOLN
500.0000 [IU] | Freq: Once | INTRAVENOUS | Status: AC | PRN
  Administered 2018-01-26: 500 [IU] via INTRAVENOUS
  Filled 2018-01-26: qty 5

## 2018-01-26 NOTE — Progress Notes (Signed)
Patient Care Team: [name], MD as PCP - General (Family Medicine) Nicholas Lose, MD as Consulting Physician (Hematology and Oncology) Delice Bison, Charlestine Massed, NP as Nurse Practitioner (Hematology and Oncology) Kyung Rudd, MD as Consulting Physician (Radiation Oncology) Rolm Bookbinder, MD as Consulting Physician (General Surgery)  DIAGNOSIS:  Encounter Diagnosis  Name Primary?  . 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, estrogen receptor positive ([place])     SUMMARY OF ONCOLOGIC HISTORY:   Bilateral breast cancer ([place])   08/16/2015 Mammogram    Left breast LIQ 4.2 cm, 4 mm oval mass 12:00, 1.2 cm mass left UOQ, enlarged left axillary lymph node    08/16/2015 Mammogram    Right breast UIQ: 3.9 cm irregular mass, 1.5 cm mass in the right LOQ, 9 mm mass right UOQ, 6 mm mass right LIQ, enlarged lymph node Rt axilla    08/20/2015 Initial Diagnosis    Right breast biopsy 1:00: IDC grade 2 ER 95%, PR 2%, Ki 67 15%; HER-2 negative ratio 1.04; 10:00: IDC grade 2-3 dissimilar to 1:00 biopsy 10:00: ER 95%, PR 0%, K67: 25%, HER-2 positive ratio 6.42 right axillary lymph node positive for metastatic cancer    08/20/2015 Procedure    Left biopsy 8:00: IDC with DCIS, grade 2; ER 95%, PR 95%, Ki-67 30%, HER-2 negative ratio 1.2; 12:00: IDC with DCIS, grade 1 ER 95%, PR 95%, Ki-67 10% HER-2 negative ratio 1.14 not similar to this biopsy, left axillary lymph node positive,     09/13/2015 - 01/17/2016 Neo-Adjuvant Chemotherapy    Neoadjuvant dose dense Adriamycin and Cytoxan followed by Taxol Herceptin Perjeta, followed by Herceptin maintenance    01/21/2016 Breast MRI    Bilateral breast masses significantly decreased in size with mild residual NME, complete resolution of numerous other nodules in both breasts, bilateral axillary lymph nodes and bilateral retropectoral lymph nodes decreased in size    02/11/2016 Surgery    Right mastectomy: IDC 2 nodules, 2.5, 1 cm, margins  negative, 3/7 lymph nodes positive T2 N1a stage II B, ER 95%, PR 2%, HER-2 negative, Ki-67 15% RCB burden 3.1 and 2.9 class II    02/11/2016 Surgery    Left mastectomy: IDC 3.1 cm with DCIS, margins negative, 0/8 lymph nodes, T2 N0 stage II a, RCB burden 2.18, class II, ER 100%, PR 95%, HER-2 negative ratio 1.2, Ki-67 30%     04/07/2016 - 05/15/2016 Radiation Therapy    Adjuvant radiation    06/26/2016 - 07/13/2017 Anti-estrogen oral therapy    Letrozole 2.5 mg daily    11/20/2016 Miscellaneous    Neratinib 240 mg daily discontinued 07/13/2017 when she presented with metastatic disease to the lungs    07/13/2017 Relapse/Recurrence    Progression of right and left hilar lymph nodes and bilateral lung nodules    07/13/2017 -  Anti-estrogen oral therapy    Ibrance with letrozole    10/24/2017 Imaging    CT chest abdomen pelvis: Interval decrease in the left hilar node.  The more superior left hilar node is slightly increased in size.  Interval decrease in the size of multiple lung nodules     CHIEF COMPLIANT: Follow-up on Ibrance with letrozole  INTERVAL HISTORY: Felicia [name] is a 57 year old with above-mentioned history of metastatic breast cancer currently on Ibrance with letrozole and she appears to be tolerating the treatment extremely well.  She has occasional mouth sores as result of Ibrance but the Magic mouthwash is helping her significantly.  She does not  have any significant fatigue or nausea vomiting.  REVIEW OF SYSTEMS:   Constitutional: Denies fevers, chills or abnormal weight loss Eyes: Denies blurriness of vision Ears, nose, mouth, throat, and face: Occasional mouth sores Respiratory: Denies cough, dyspnea or wheezes Cardiovascular: Denies palpitation, chest discomfort Gastrointestinal:  Denies nausea, heartburn or change in bowel habits Skin: Denies abnormal skin rashes Lymphatics: Denies new lymphadenopathy or easy bruising Neurological:Denies numbness,  tingling or new weaknesses Behavioral/Psych: Mood is stable, no new changes  Extremities: No lower extremity edema   All other systems were reviewed with the patient and are negative.  I have reviewed the past medical history, past surgical history, social history and family history with the patient and they are unchanged from previous note.  ALLERGIES:  is allergic to no known allergies.  MEDICATIONS:  Current Outpatient Medications  Medication Sig Dispense Refill  . Calcium Carb-Cholecalciferol (CALCIUM 600+D) 600-800 MG-UNIT TABS Take 1 tablet by mouth daily.    . Cholecalciferol (VITAMIN D-3) 5000 units TABS Take 1 tablet by mouth daily.    Marland Kitchen letrozole (FEMARA) 2.5 MG tablet Take 1 tablet (2.5 mg total) by mouth daily. 90 tablet 3  . lidocaine-prilocaine (EMLA) cream Apply 1 application topically as needed (Apply 2- hours prior to access of port). 30 g 6  . lisinopril (PRINIVIL,ZESTRIL) 20 MG tablet Take 1 tablet (20 mg total) by mouth daily. 30 tablet 6  . magic mouthwash w/lidocaine SOLN Take 5 mLs by mouth 3 (three) times daily as needed for mouth pain. 100 mL 0  . palbociclib (IBRANCE) 125 MG capsule Take 1 capsule (125 mg total) by mouth daily with breakfast. Take whole with food. 21 capsule 3  . potassium chloride (K-DUR,KLOR-CON) 10 MEQ tablet TAKE 2 TABLETS (20 MEQ TOTAL) BY MOUTH 2 TIMES DAILY. 120 tablet 2   No current facility-administered medications for this visit.    Facility-Administered Medications Ordered in Other Visits  Medication Dose Route Frequency Provider Last Rate Last Dose  . sodium chloride flush (NS) 0.9 % injection 10 mL  10 mL Intravenous PRN Nicholas Lose, MD   10 mL at 04/14/17 0831    PHYSICAL EXAMINATION: ECOG PERFORMANCE STATUS: 1 - Symptomatic but completely ambulatory  Vitals:   01/26/18 0947  BP: (!) 173/74  Pulse: (!) 57  Resp: 17  Temp: 98.1 F (36.7 C)  SpO2: 100%   Filed Weights   01/26/18 0947  Weight: 220 lb 11.2 oz (100.1 kg)     GENERAL:alert, no distress and comfortable SKIN: skin color, texture, turgor are normal, no rashes or significant lesions EYES: normal, Conjunctiva are pink and non-injected, sclera clear OROPHARYNX:no exudate, no erythema and lips, buccal mucosa, and tongue normal  NECK: supple, thyroid normal size, non-tender, without nodularity LYMPH:  no palpable lymphadenopathy in the cervical, axillary or inguinal LUNGS: clear to auscultation and percussion with normal breathing effort HEART: regular rate & rhythm and no murmurs and no lower extremity edema ABDOMEN:abdomen soft, non-tender and normal bowel sounds MUSCULOSKELETAL:no cyanosis of digits and no clubbing  NEURO: alert & oriented x 3 with fluent speech, no focal motor/sensory deficits EXTREMITIES: No lower extremity edema   LABORATORY DATA:  I have reviewed the data as listed CMP Latest Ref Rng & Units 01/26/2018 10/23/2017 09/25/2017  Glucose 70 - 99 mg/dL 109(H) 124(H) 101  BUN 6 - 20 mg/dL _0 Creatinine 0.44 - 1.00 mg/dL 0.71 0.58 0.75  Sodium 135 - 145 mmol/L 140 141 138  Potassium 3.5 - 5.1 mmol/L  3.9 3.7 3.9  Chloride 98 - 111 mmol/L 107 107 106  CO2 22 - 32 mmol/L _0 Calcium 8.9 - 10.3 mg/dL 9.3 8.8(L) 9.1  Total Protein 6.5 - 8.1 g/dL 7.4 7.1 7.2  Total Bilirubin 0.3 - 1.2 mg/dL 0.6 0.9 0.6  Alkaline Phos 38 - 126 U/L 62 53 63  AST 15 - 41 U/L _1 ALT 0 - 44 U/L 35 25 27    Lab Results  Component Value Date   WBC 3.4 (L) 01/26/2018   HGB 13.0 01/26/2018   HCT 37.4 01/26/2018   MCV 101.8 (H) 01/26/2018   PLT 169 01/26/2018   NEUTROABS 1.5 01/26/2018    ASSESSMENT & PLAN:  Bilateral breast cancer ([place]) Treatment Summary: 1. Neoadjuvant chemotherapy with either AC followed by THP started 09/13/2015 and completed 12 weeks of Taxol 11/29/2015, Herceptin Perjeta 6; now on Herceptin  2. Bilateral mastectomies and lymph node surgery 02/11/16 3. Adjuvant radiation 04/07/2016 to 05/15/2016 4.  Adjuvant antiestrogen therapy with Letrozole started 06/26/2016 5. 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  10/24/2017 CT chest abdomen pelvis: Interval decrease in the left hilar node.  The more superior left hilar node is slightly increased in size.  Interval decrease in the size of multiple lung nodules  Current treatment: Ibrance with letrozole Toxicities: 1. Neutropenia: Monitoring her white blood cell count.  ANC today is 1.5 2. Mouth sore: Improved with Magic mouthwash  Return to clinic in 3 months with scans and labs and follow-up.    Orders Placed This Encounter  Procedures  . CT Abdomen Pelvis W Contrast    Standing Status:   Future    Standing Expiration Date:   01/26/2019    Order Specific Question:   ** REASON FOR EXAM (FREE TEXT)    Answer:   Restaging metastatic breast cancer    Order Specific Question:   If indicated for the ordered procedure, I authorize the administration of contrast media per Radiology protocol    Answer:   Yes    Order Specific Question:   Is patient pregnant?    Answer:   No    Order Specific Question:   Preferred imaging location?    Answer:   [facility]    Order Specific Question:   Is Oral Contrast requested for this exam?    Answer:   Yes, Per Radiology protocol    Order Specific Question:   Radiology Contrast Protocol - do NOT remove file path    Answer:   [email or web address]  . CT Chest W Contrast    Standing Status:   Future    Standing Expiration Date:   01/26/2019    Order Specific Question:   ** REASON FOR EXAM (FREE TEXT)    Answer:   Met breast cancer restaging    Order Specific Question:   If indicated for the ordered procedure, I authorize the administration of contrast media per Radiology protocol    Answer:   Yes    Order Specific Question:   Is patient  pregnant?    Answer:   No    Order Specific Question:   Preferred imaging location?    Answer:   [facility]    Order Specific Question:   Radiology Contrast Protocol - do NOT remove file path    Answer:   \\[email or web address]   The patient has a good understanding of the overall  plan. she agrees with it. she will call with any problems that may develop before the next visit here.   Harriette Ohara, MD 01/26/18

## 2018-01-26 NOTE — Telephone Encounter (Signed)
Gave pt avs and calendar  °

## 2018-01-26 NOTE — Assessment & Plan Note (Signed)
Treatment Summary: 1. Neoadjuvant chemotherapy with either AC followed by THP started 09/13/2015 and completed 12 weeks of Taxol 11/29/2015, Herceptin Perjeta 6; now on Herceptin  2. Bilateral mastectomies and lymph node surgery 02/11/16 3. Adjuvant radiation 04/07/2016 to 05/15/2016 4. Adjuvant antiestrogen therapy with Letrozole started 06/26/2016 5. 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  10/24/2017 CT chest abdomen pelvis: Interval decrease in the left hilar node.  The more superior left hilar node is slightly increased in size.  Interval decrease in the size of multiple lung nodules  Current treatment: Ibrance with letrozole Toxicities: 1. Neutropenia: Monitoring her white blood cell count. 2. Mouth sore  Return to clinic in 3 months with scans and labs and follow-up.

## 2018-01-26 NOTE — Patient Instructions (Signed)
Implanted Port Home Guide An implanted port is a type of central line that is placed under the skin. Central lines are used to provide IV access when treatment or nutrition needs to be given through a person's veins. Implanted ports are used for long-term IV access. An implanted port may be placed because:  You need IV medicine that would be irritating to the small veins in your hands or arms.  You need long-term IV medicines, such as antibiotics.  You need IV nutrition for a long period.  You need frequent blood draws for lab tests.  You need dialysis.  Implanted ports are usually placed in the chest area, but they can also be placed in the upper arm, the abdomen, or the leg. An implanted port has two main parts:  Reservoir. The reservoir is round and will appear as a small, raised area under your skin. The reservoir is the part where a needle is inserted to give medicines or draw blood.  Catheter. The catheter is a thin, flexible tube that extends from the reservoir. The catheter is placed into a large vein. Medicine that is inserted into the reservoir goes into the catheter and then into the vein.  How will I care for my incision site? Do not get the incision site wet. Bathe or shower as directed by your health care provider. How is my port accessed? Special steps must be taken to access the port:  Before the port is accessed, a numbing cream can be placed on the skin. This helps numb the skin over the port site.  Your health care provider uses a sterile technique to access the port. ? Your health care provider must put on a mask and sterile gloves. ? The skin over your port is cleaned carefully with an antiseptic and allowed to dry. ? The port is gently pinched between sterile gloves, and a needle is inserted into the port.  Only "non-coring" port needles should be used to access the port. Once the port is accessed, a blood return should be checked. This helps ensure that the port  is in the vein and is not clogged.  If your port needs to remain accessed for a constant infusion, a clear (transparent) bandage will be placed over the needle site. The bandage and needle will need to be changed every week, or as directed by your health care provider.  Keep the bandage covering the needle clean and dry. Do not get it wet. Follow your health care provider's instructions on how to take a shower or bath while the port is accessed.  If your port does not need to stay accessed, no bandage is needed over the port.  What is flushing? Flushing helps keep the port from getting clogged. Follow your health care provider's instructions on how and when to flush the port. Ports are usually flushed with saline solution or a medicine called heparin. The need for flushing will depend on how the port is used.  If the port is used for intermittent medicines or blood draws, the port will need to be flushed: ? After medicines have been given. ? After blood has been drawn. ? As part of routine maintenance.  If a constant infusion is running, the port may not need to be flushed.  How long will my port stay implanted? The port can stay in for as long as your health care provider thinks it is needed. When it is time for the port to come out, surgery will be   done to remove it. The procedure is similar to the one performed when the port was put in. When should I seek immediate medical care? When you have an implanted port, you should seek immediate medical care if:  You notice a bad smell coming from the incision site.  You have swelling, redness, or drainage at the incision site.  You have more swelling or pain at the port site or the surrounding area.  You have a fever that is not controlled with medicine.  This information is not intended to replace advice given to you by your health care provider. Make sure you discuss any questions you have with your health care provider. Document  Released: 04/14/2005 Document Revised: 09/20/2015 Document Reviewed: 12/20/2012 Elsevier Interactive Patient Education  2017 [employer]  

## 2018-01-26 NOTE — Assessment & Plan Note (Deleted)
Treatment Summary: 1. Neoadjuvant chemotherapy with either AC followed by THP started 09/13/2015 and completed 12 weeks of Taxol 11/29/2015, Herceptin Perjeta 6; now on Herceptin  2. Bilateral mastectomies and lymph node surgery 02/11/16 3. Adjuvant radiation 04/07/2016 to 05/15/2016 4. Adjuvant antiestrogen therapy with Letrozole started 06/26/2016 5. 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  10/24/2017 CT chest abdomen pelvis: Interval decrease in the left hilar node.  The more superior left hilar node is slightly increased in size.  Interval decrease in the size of multiple lung nodules  Current treatment: Ibrance with letrozole Toxicities: 1. Neutropenia: Monitoring her white blood cell count. 2. Mouth sore  Return to clinic in 3 months for follow-up with labs and scans.  Scans are being done every 6 months

## 2018-03-11 ENCOUNTER — Encounter: Payer: Self-pay | Admitting: Family Medicine

## 2018-03-11 ENCOUNTER — Ambulatory Visit: Payer: Self-pay | Attending: Family Medicine | Admitting: Family Medicine

## 2018-03-11 VITALS — BP 122/64 | HR 58 | Temp 97.3°F | Ht 60.0 in | Wt 220.0 lb

## 2018-03-11 DIAGNOSIS — C50012 Malignant neoplasm of nipple and areola, left female breast: Secondary | ICD-10-CM

## 2018-03-11 DIAGNOSIS — Z9221 Personal history of antineoplastic chemotherapy: Secondary | ICD-10-CM | POA: Insufficient documentation

## 2018-03-11 DIAGNOSIS — Z79899 Other long term (current) drug therapy: Secondary | ICD-10-CM | POA: Insufficient documentation

## 2018-03-11 DIAGNOSIS — Z853 Personal history of malignant neoplasm of breast: Secondary | ICD-10-CM | POA: Insufficient documentation

## 2018-03-11 DIAGNOSIS — I10 Essential (primary) hypertension: Secondary | ICD-10-CM | POA: Insufficient documentation

## 2018-03-11 DIAGNOSIS — K219 Gastro-esophageal reflux disease without esophagitis: Secondary | ICD-10-CM | POA: Insufficient documentation

## 2018-03-11 DIAGNOSIS — Z170 Estrogen receptor positive status [ER+]: Secondary | ICD-10-CM | POA: Insufficient documentation

## 2018-03-11 DIAGNOSIS — R05 Cough: Secondary | ICD-10-CM | POA: Insufficient documentation

## 2018-03-11 DIAGNOSIS — Z923 Personal history of irradiation: Secondary | ICD-10-CM | POA: Insufficient documentation

## 2018-03-11 DIAGNOSIS — Z9013 Acquired absence of bilateral breasts and nipples: Secondary | ICD-10-CM | POA: Insufficient documentation

## 2018-03-11 DIAGNOSIS — R059 Cough, unspecified: Secondary | ICD-10-CM

## 2018-03-11 DIAGNOSIS — C50011 Malignant neoplasm of nipple and areola, right female breast: Secondary | ICD-10-CM

## 2018-03-11 MED ORDER — CETIRIZINE HCL 10 MG PO TABS: 10.0000 mg | ORAL_TABLET | Freq: Every day | ORAL | 1 refills | Status: DC

## 2018-03-11 MED ORDER — LISINOPRIL 20 MG PO TABS: 20.0000 mg | ORAL_TABLET | Freq: Every day | ORAL | 6 refills | Status: DC

## 2018-03-11 NOTE — Patient Instructions (Signed)
Hipertensión  Hypertension  La hipertensión, conocida comúnmente como presión arterial alta, se produce cuando la sangre bombea en las arterias con mucha fuerza. Las arterias son los vasos sanguíneos que transportan la sangre desde el corazón al resto del cuerpo. La hipertensión hace que el corazón haga más esfuerzo para bombear sangre y puede provocar que las arterias se estrechen o endurezcan. La hipertensión no tratada o no controlada puede causar infarto de miocardio, accidentes cerebrovasculares, enfermedad renal y otros problemas.  Una lectura de la presión arterial consiste de un número más alto sobre un número más bajo. En condiciones ideales, la presión arterial debe estar por debajo de 120/80. El primer número ("superior") es la presión sistólica. Es la medida de la presión de las arterias cuando el corazón late. El segundo número ("inferior") es la presión diastólica. Es la medida de la presión en las arterias cuando el corazón se relaja.  ¿Cuáles son las causas?  Se desconoce la causa de esta afección.  ¿Qué incrementa el riesgo?  Algunos factores de riesgo de hipertensión están bajo su control. Otros no.  Factores que puede modificar  · Fumar.  · Tener diabetes mellitus tipo 2, colesterol alto, o ambos.  · No hacer la cantidad suficiente de actividad física o ejercicio.  · Tener sobrepeso.  · Consumir mucha grasa, azúcar, calorías o sal (sodio) en su dieta.  · Beber alcohol en exceso.  Factores que son difíciles o imposibles de modificar  · Tener enfermedad renal crónica.  · Tener antecedentes familiares de presión arterial alta.  · La edad. Los riesgos aumentan con la edad.  · La raza. El riesgo es mayor para las personas afroamericanas.  · El sexo. Antes de los 45 años, los hombres corren más riesgo que las mujeres. Después de los 65 años, las mujeres corren más riesgo que los hombres.  · Tener apnea obstructiva del sueño.  · El estrés.  ¿Cuáles son los signos o los síntomas?   La presión arterial extremadamente alta (crisis hipertensiva) puede provocar:  · Dolor de cabeza.  · Ansiedad.  · Falta de aire.  · Hemorragia nasal.  · Náuseas y vómitos.  · Dolor de pecho intenso.  · Una crisis de movimientos que no puede controlar (convulsiones).    ¿Cómo se diagnostica?  Esta afección se diagnostica midiendo su presión arterial mientras se encuentra sentado, con el brazo apoyado sobre una superficie. El brazalete del tensiómetro debe colocarse directamente sobre la piel de la parte superior del brazo y al nivel de su corazón. Debe medirla al menos dos veces en el mismo brazo. Determinadas condiciones pueden causar una diferencia de presión arterial entre el brazo izquierdo y el derecho.  Ciertos factores pueden provocar que las lecturas de la presión arterial sean inferiores o superiores a lo normal (elevadas) por un período corto de tiempo:  · Si su presión arterial es más alta cuando se encuentra en el consultorio del médico que cuando la mide en su hogar, se denomina "hipertensión de bata blanca". La mayoría de las personas que tienen esta afección no deben ser medicadas.  · Si su presión arterial es más alta en el hogar que cuando se encuentra en el consultorio del médico, se denomina "hipertensión enmascarada". La mayoría de las personas que tienen esta afección deben ser medicadas para controlar la presión arterial.    Si tiene una lecturas de presión arterial alta durante una visita o si tiene presión arterial normal con otros factores de riesgo:  · Es posible que se le   pida que regrese otro día para volver a controlar su presión arterial.  · Se le puede pedir que se controle la presión arterial en su casa durante 1 semana o más.    Si se le diagnostica hipertensión, es posible que se le realicen otros análisis de sangre o estudios de diagnóstico por imágenes para ayudar a su médico a comprender su riesgo general de tener otras afecciones.  ¿Cómo se trata?   Esta afección se trata haciendo cambios saludables en el estilo de vida, tales como ingerir alimentos saludables, realizar más ejercicio y reducir el consumo de alcohol. El médico puede recetarle medicamentos si los cambios en el estilo de vida no son suficientes para lograr controlar la presión arterial y si:  · Su presión arterial sistólica está por encima de 130.  · Su presión arterial diastólica está por encima de 80.    La presión arterial deseada puede variar en función de las enfermedades, la edad y otros factores personales.  Siga estas instrucciones en su casa:  Comida y bebida  · Siga una dieta con alto contenido de fibras y potasio, y con bajo contenido de sodio, azúcar agregada y grasas. Un ejemplo de plan alimenticio es la dieta DASH (Dietary Approaches to Stop Hypertension, Métodos alimenticios para detener la hipertensión). Para alimentarse de esta manera:  ? Coma mucha fruta y verdura fresca. Trate de que la mitad del plato de cada comida sea de frutas y verduras.  ? Coma cereales integrales, como pasta integral, arroz integral y pan integral. Llene aproximadamente un cuarto del plato con cereales integrales.  ? Coma y beba productos lácteos con bajo contenido de grasa, como leche descremada o yogur bajo en grasas.  ? Evite la ingesta de cortes de carne grasa, carne procesada o curada, y carne de ave con piel. Llene aproximadamente un cuarto del plato con proteínas magras, como pescado, pollo sin piel, frijoles, huevos y tofu.  ? Evite ingerir alimentos prehechos o procesados. En general, estos tienen mayor cantidad de sodio, azúcar agregada y grasa.  · Reduzca su ingesta diaria de sodio. La mayoría de las personas que tienen hipertensión deben comer menos de 1500 mg de sodio por día.  · Limite el consumo de alcohol a no más de 1 medida por día si es mujer y no está embarazada y a 2 medidas por día si es hombre. Una medida equivale  a 12 onzas de cerveza, 5 onzas de vino o 1½ onzas de bebidas alcohólicas de alta graduación.  Estilo de vida  · Trabaje con su médico para mantener un peso saludable o perder peso. Pregúntele cual es su peso recomendado.  · Realice al menos 30 minutos de ejercicio que haga que se acelere su corazón (ejercicio aeróbico) la mayoría de los días de la semana. Estas actividades pueden incluir caminar, nadar o andar en bicicleta.  · Incluya ejercicios para fortalecer sus músculos (ejercicios de resistencia), como pilates o levantamiento de pesas, como parte de su rutina semanal de ejercicios. Intente realizar 30 minutos de este tipo de ejercicios al menos tres días a la semana.  · No consuma ningún producto que contenga nicotina o tabaco, como cigarrillos y cigarrillos electrónicos. Si necesita ayuda para dejar de fumar, consulte al médico.  · Contrólese la presión arterial en su casa según las indicaciones del médico.  · Concurra a todas las visitas de control como se lo haya indicado el médico. Esto es importante.  Medicamentos  · Tome los medicamentos de venta libre y los recetados solamente como se   lo haya indicado el médico. Siga cuidadosamente las indicaciones. Los medicamentos para la presión arterial deben tomarse según las indicaciones.  · No omita las dosis de medicamentos para la presión arterial. Si lo hace, estará en riesgo de tener problemas y puede hacer que los medicamentos sean menos eficaces.  · Pregúntele a su médico a qué efectos secundarios o reacciones a los medicamentos debe prestar atención.  Comuníquese con un médico si:  · Piensa que tiene una reacción a un medicamento que está tomando.  · Tiene dolores de cabeza frecuentes (recurrentes).  · Siente mareos.  · Tiene hinchazón en los tobillos.  · Tiene problemas de visión.  Solicite ayuda de inmediato si:  · Siente un dolor de cabeza intenso o confusión.  · Siente debilidad inusual o adormecimiento.  · Siente que va a desmayarse.   · Siente un dolor intenso en el pecho o el abdomen.  · Vomita repetidas veces.  · Tiene dificultad para respirar.  Resumen  · La hipertensión se produce cuando la sangre bombea en las arterias con mucha fuerza. Si esta afección no se controla, podría correr riesgo de tener complicaciones graves.  · La presión arterial deseada puede variar en función de las enfermedades, la edad y otros factores personales. Para la mayoría de las personas, una presión arterial normal es menor que 120/80.  · La hipertensión se trata con cambios en el estilo de vida, medicamentos o una combinación de ambos. Los cambios en el estilo de vida incluyen pérdida de peso, ingerir alimentos sanos, seguir una dieta baja en sodio, hacer más ejercicio y limitar el consumo de alcohol.  Esta información no tiene como fin reemplazar el consejo del médico. Asegúrese de hacerle al médico cualquier pregunta que tenga.  Document Released: 04/14/2005 Document Revised: 03/26/2016 Document Reviewed: 03/26/2016  [employer] Interactive Patient Education © 2018 [employer]

## 2018-03-11 NOTE — Progress Notes (Signed)
Subjective:  Patient ID: Felicia [name] Age, female    DOB: 1960/09/10  Age: 57 y.o. MRN: [id number]  CC: Hypertension   HPI Felicia [name] is 57 year old female with a history of GERD, bilateral breast cancer diagnosed in 07/2015 (status post bilateral mastectomy, chemotherapy and radiation), status post bilateral lymph node excision now on Ibrance and Letrozole who comes into the clinic accompanied by her daughter to follow up on Hypertension. Her last visit to oncology was on 01/26/2018 and she reports tolerating her medications well and has a good appetite, declines fatigue. She is compliant with her antihypertensive and is also compliant with a low-sodium diet. Initially blood pressure was elevated at 180/101 but repeat performed manually returned at 122/64  She is concerned about a cough which occurs at night intermittently but denies postnasal drip. With regards to colonoscopy she informs me she was advised to hold off for now as per her oncologist.  Past Medical History:  Diagnosis Date  . Cancer ([place])    breast  . GERD (gastroesophageal reflux disease)     Past Surgical History:  Procedure Laterality Date  . MASTECTOMY MODIFIED RADICAL Bilateral 02/11/2016  . MASTECTOMY MODIFIED RADICAL Bilateral 02/11/2016   Procedure: BILATERAL MASTECTOMY MODIFIED RADICAL;  Surgeon: Rolm Bookbinder, MD;  Location: [place];  Service: General;  Laterality: Bilateral;  . PORTACATH PLACEMENT N/A 09/04/2015   Procedure: INSERTION PORT-A-CATH WITH [place];  Surgeon: Rolm Bookbinder, MD;  Location: [facility];  Service: General;  Laterality: N/A;  . TUBAL LIGATION      Allergies  Allergen Reactions  . No Known Allergies      Outpatient Medications Prior to Visit  Medication Sig Dispense Refill  . Calcium Carb-Cholecalciferol (CALCIUM 600+D) 600-800 MG-UNIT TABS Take 1 tablet by mouth daily.    . Cholecalciferol (VITAMIN D-3) 5000 units TABS Take 1  tablet by mouth daily.    Marland Kitchen letrozole (FEMARA) 2.5 MG tablet Take 1 tablet (2.5 mg total) by mouth daily. 90 tablet 3  . lidocaine-prilocaine (EMLA) cream Apply 1 application topically as needed (Apply 2- hours prior to access of port). 30 g 6  . magic mouthwash w/lidocaine SOLN Take 5 mLs by mouth 3 (three) times daily as needed for mouth pain. 100 mL 0  . palbociclib (IBRANCE) 125 MG capsule Take 1 capsule (125 mg total) by mouth daily with breakfast. Take whole with food. 21 capsule 3  . potassium chloride (K-DUR,KLOR-CON) 10 MEQ tablet TAKE 2 TABLETS (20 MEQ TOTAL) BY MOUTH 2 TIMES DAILY. 120 tablet 2  . lisinopril (PRINIVIL,ZESTRIL) 20 MG tablet Take 1 tablet (20 mg total) by mouth daily. 30 tablet 6   Facility-Administered Medications Prior to Visit  Medication Dose Route Frequency Provider Last Rate Last Dose  . sodium chloride flush (NS) 0.9 % injection 10 mL  10 mL Intravenous PRN Nicholas Lose, MD   10 mL at 04/14/17 0831    ROS Review of Systems  Constitutional: Negative for activity change, appetite change and fatigue.  HENT: Negative for congestion, sinus pressure and sore throat.   Eyes: Negative for visual disturbance.  Respiratory: Positive for cough. Negative for chest tightness, shortness of breath and wheezing.   Cardiovascular: Negative for chest pain and palpitations.  Gastrointestinal: Negative for abdominal distention, abdominal pain and constipation.  Endocrine: Negative for polydipsia.  Genitourinary: Negative for dysuria and frequency.  Musculoskeletal: Negative for arthralgias and back pain.  Skin: Negative for rash.  Neurological: Negative for tremors, light-headedness and numbness.  Hematological: Does not bruise/bleed easily.  Psychiatric/Behavioral: Negative for agitation and behavioral problems.    Objective:  BP (!) 180/101   Pulse (!) 58   Temp (!) 97.3 F (36.3 C) (Oral)   Ht 5' (1.524 m)   Wt 220 lb (99.8 kg)   SpO2 97%   BMI 42.97 kg/m    BP/Weight 03/11/2018 01/26/2018 0/73/7106  Systolic BP 269 485 462  Diastolic BP 703 74 90  Wt. (Lbs) 220 220.7 217.2  BMI 42.97 43.1 42.42      Physical Exam  Constitutional: She is oriented to person, place, and time. She appears well-developed and well-nourished.  Cardiovascular: Normal rate, normal heart sounds and intact distal pulses.  No murmur heard. Pulmonary/Chest: Effort normal and breath sounds normal. She has no wheezes. She has no rales. She exhibits no tenderness.  Abdominal: Soft. Bowel sounds are normal. She exhibits no distension and no mass. There is no tenderness.  Musculoskeletal: Normal range of motion.  Neurological: She is alert and oriented to person, place, and time.  Skin: Skin is warm and dry.  Psychiatric: She has a normal mood and affect.      Assessment & Plan:   1. Essential hypertension Controlled -initially blood pressure was elevated at 180/101 but repeat performed manually returned at 122/64 Counseled on blood pressure goal of less than 130/80, low-sodium, DASH diet, medication compliance, 150 minutes of moderate intensity exercise per week. Discussed medication compliance, adverse effects. - lisinopril (PRINIVIL,ZESTRIL) 20 MG tablet; Take 1 tablet (20 mg total) by mouth daily.  Dispense: 30 tablet; Refill: 6  2. 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 Cough We will treat for sinus etiology If symptoms persist we will consider switching from lisinopril to losartan - cetirizine (ZYRTEC) 10 MG tablet; Take 1 tablet (10 mg total) by mouth daily.  Dispense: 30 tablet; Refill: 1   Meds ordered this encounter  Medications  . cetirizine (ZYRTEC) 10 MG tablet    Sig: Take 1 tablet (10 mg total) by mouth daily.    Dispense:  30 tablet    Refill:  1  .  lisinopril (PRINIVIL,ZESTRIL) 20 MG tablet    Sig: Take 1 tablet (20 mg total) by mouth daily.    Dispense:  30 tablet    Refill:  6    Follow-up: Return in about 6 months (around 09/09/2018) for Follow-up of chronic medical conditions.   [name] MD

## 2018-03-24 ENCOUNTER — Telehealth: Payer: Self-pay | Admitting: Hematology and Oncology

## 2018-03-24 NOTE — Telephone Encounter (Signed)
Tried to reach regarding 1/24 °

## 2018-03-31 ENCOUNTER — Inpatient Hospital Stay: Payer: Self-pay | Attending: Hematology and Oncology

## 2018-03-31 ENCOUNTER — Inpatient Hospital Stay: Payer: Self-pay

## 2018-03-31 DIAGNOSIS — C50011 Malignant neoplasm of nipple and areola, right female breast: Secondary | ICD-10-CM | POA: Insufficient documentation

## 2018-03-31 DIAGNOSIS — Z170 Estrogen receptor positive status [ER+]: Secondary | ICD-10-CM | POA: Insufficient documentation

## 2018-03-31 DIAGNOSIS — Z95828 Presence of other vascular implants and grafts: Secondary | ICD-10-CM

## 2018-03-31 DIAGNOSIS — C50012 Malignant neoplasm of nipple and areola, left female breast: Secondary | ICD-10-CM | POA: Insufficient documentation

## 2018-03-31 LAB — CMP (CANCER CENTER ONLY)
ALBUMIN: 4.1 g/dL (ref 3.5–5.0)
ALK PHOS: 66 U/L (ref 38–126)
ALT: 34 U/L (ref 0–44)
ANION GAP: 8 (ref 5–15)
AST: 32 U/L (ref 15–41)
BILIRUBIN TOTAL: 0.8 mg/dL (ref 0.3–1.2)
BUN: 13 mg/dL (ref 6–20)
CALCIUM: 10 mg/dL (ref 8.9–10.3)
CO2: 25 mmol/L (ref 22–32)
Chloride: 105 mmol/L (ref 98–111)
Creatinine: 0.75 mg/dL (ref 0.44–1.00)
GFR, Est AFR Am: >60 mL/min (ref >60)
GFR, Estimated: >60 mL/min (ref >60)
GLUCOSE: 108 mg/dL — AB (ref 70–99)
Potassium: 4 mmol/L (ref 3.5–5.1)
SODIUM: 138 mmol/L (ref 135–145)
TOTAL PROTEIN: 7.6 g/dL (ref 6.5–8.1)

## 2018-03-31 LAB — CBC WITH DIFFERENTIAL (CANCER CENTER ONLY)
Abs Immature Granulocytes: 0.01 10*3/uL (ref 0.00–0.07)
Basophils Absolute: 0.1 10*3/uL (ref 0.0–0.1)
Basophils Relative: 1 %
EOS ABS: 0 10*3/uL (ref 0.0–0.5)
EOS PCT: 1 %
HEMATOCRIT: 37.9 % (ref 36.0–46.0)
Hemoglobin: 13.4 g/dL (ref 12.0–15.0)
Immature Granulocytes: 0 %
LYMPHS ABS: 1.7 10*3/uL (ref 0.7–4.0)
Lymphocytes Relative: 35 %
MCH: 34.4 pg — AB (ref 26.0–34.0)
MCHC: 35.4 g/dL (ref 30.0–36.0)
MCV: 97.4 fL (ref 80.0–100.0)
MONO ABS: 0.2 10*3/uL (ref 0.1–1.0)
Monocytes Relative: 5 %
NEUTROS ABS: 2.9 10*3/uL (ref 1.7–7.7)
NEUTROS PCT: 58 %
Platelet Count: 294 10*3/uL (ref 150–400)
RBC: 3.89 MIL/uL (ref 3.87–5.11)
RDW: 12.2 % (ref 11.5–15.5)
WBC Count: 4.9 10*3/uL (ref 4.0–10.5)
nRBC: 0 % (ref 0.0–0.2)

## 2018-03-31 MED ORDER — HEPARIN SOD (PORK) LOCK FLUSH 100 UNIT/ML IV SOLN
500.0000 [IU] | Freq: Once | INTRAVENOUS | Status: AC | PRN
  Administered 2018-03-31: 500 [IU] via INTRAVENOUS
  Filled 2018-03-31: qty 5

## 2018-03-31 MED ORDER — SODIUM CHLORIDE 0.9% FLUSH
10.0000 mL | INTRAVENOUS | Status: DC | PRN
  Administered 2018-03-31: 10 mL via INTRAVENOUS
  Filled 2018-03-31: qty 10

## 2018-04-13 ENCOUNTER — Other Ambulatory Visit: Payer: Self-pay | Admitting: Hematology and Oncology

## 2018-04-13 DIAGNOSIS — C50012 Malignant neoplasm of nipple and areola, left female breast: Principal | ICD-10-CM

## 2018-04-13 DIAGNOSIS — Z170 Estrogen receptor positive status [ER+]: Principal | ICD-10-CM

## 2018-04-13 DIAGNOSIS — C50011 Malignant neoplasm of nipple and areola, right female breast: Secondary | ICD-10-CM

## 2018-04-13 MED ORDER — PALBOCICLIB 125 MG PO CAPS
125.0000 mg | ORAL_CAPSULE | Freq: Every day | ORAL | 6 refills | Status: DC
Start: 2018-04-13 — End: 2018-04-14

## 2018-04-14 ENCOUNTER — Telehealth: Payer: Self-pay | Admitting: Pharmacist

## 2018-04-14 DIAGNOSIS — Z170 Estrogen receptor positive status [ER+]: Principal | ICD-10-CM

## 2018-04-14 DIAGNOSIS — C50011 Malignant neoplasm of nipple and areola, right female breast: Secondary | ICD-10-CM

## 2018-04-14 DIAGNOSIS — C50012 Malignant neoplasm of nipple and areola, left female breast: Principal | ICD-10-CM

## 2018-04-14 MED ORDER — PALBOCICLIB 125 MG PO CAPS: 125.0000 mg | ORAL_CAPSULE | Freq: Every day | ORAL | 6 refills | Status: DC

## 2018-04-14 NOTE — Telephone Encounter (Signed)
Oral Oncology Pharmacist Encounter  Received call from [facility] pharmacy that they had received new prescription for Ibrance for patient, but needed cycling to be more clearly defined on prescription.  Prescription for Ibrance 125mg  capsules, take 1 capsule (125mg ) by mouth once daily, take for 21 days on, 7 days off, repeated every 28 days, take with food, e-scribed to [facility].  Johny Drilling, PharmD, BCPS, BCOP  04/14/2018 2:29 PM Oral Oncology Clinic [phone]

## 2018-04-26 ENCOUNTER — Inpatient Hospital Stay: Payer: Self-pay

## 2018-04-26 ENCOUNTER — Ambulatory Visit (HOSPITAL_COMMUNITY)
Admission: RE | Admit: 2018-04-26 | Discharge: 2018-04-26 | Disposition: A | Discharge status: Home / Self Care | Payer: Self-pay | Source: Ambulatory Visit | Attending: Hematology and Oncology | Admitting: Hematology and Oncology

## 2018-04-26 DIAGNOSIS — C50011 Malignant neoplasm of nipple and areola, right female breast: Secondary | ICD-10-CM

## 2018-04-26 DIAGNOSIS — Z170 Estrogen receptor positive status [ER+]: Principal | ICD-10-CM

## 2018-04-26 DIAGNOSIS — C50012 Malignant neoplasm of nipple and areola, left female breast: Principal | ICD-10-CM

## 2018-04-26 DIAGNOSIS — Z95828 Presence of other vascular implants and grafts: Secondary | ICD-10-CM

## 2018-04-26 LAB — CBC WITH DIFFERENTIAL (CANCER CENTER ONLY)
Abs Immature Granulocytes: 0 10*3/uL (ref 0.00–0.07)
Basophils Absolute: 0.1 10*3/uL (ref 0.0–0.1)
Basophils Relative: 2 %
Eosinophils Absolute: 0 10*3/uL (ref 0.0–0.5)
Eosinophils Relative: 1 %
HCT: 37.7 % (ref 36.0–46.0)
Hemoglobin: 13 g/dL (ref 12.0–15.0)
Immature Granulocytes: 0 %
Lymphocytes Relative: 40 %
Lymphs Abs: 1.4 10*3/uL (ref 0.7–4.0)
MCH: 34.2 pg — ABNORMAL HIGH (ref 26.0–34.0)
MCHC: 34.5 g/dL (ref 30.0–36.0)
MCV: 99.2 fL (ref 80.0–100.0)
Monocytes Absolute: 0.2 10*3/uL (ref 0.1–1.0)
Monocytes Relative: 6 %
Neutro Abs: 1.9 10*3/uL (ref 1.7–7.7)
Neutrophils Relative %: 51 %
Platelet Count: 221 10*3/uL (ref 150–400)
RBC: 3.8 MIL/uL — AB (ref 3.87–5.11)
RDW: 12.7 % (ref 11.5–15.5)
WBC: 3.5 10*3/uL — AB (ref 4.0–10.5)
nRBC: 0 % (ref 0.0–0.2)

## 2018-04-26 LAB — CMP (CANCER CENTER ONLY)
ALT: 36 U/L (ref 0–44)
AST: 28 U/L (ref 15–41)
Albumin: 4.1 g/dL (ref 3.5–5.0)
Alkaline Phosphatase: 70 U/L (ref 38–126)
Anion gap: 10 (ref 5–15)
BUN: 18 mg/dL (ref 6–20)
CHLORIDE: 107 mmol/L (ref 98–111)
CO2: 23 mmol/L (ref 22–32)
Calcium: 10.2 mg/dL (ref 8.9–10.3)
Creatinine: 0.75 mg/dL (ref 0.44–1.00)
GFR, Est AFR Am: >60 mL/min (ref >60)
Glucose, Bld: 116 mg/dL — ABNORMAL HIGH (ref 70–99)
Potassium: 3.9 mmol/L (ref 3.5–5.1)
Sodium: 140 mmol/L (ref 135–145)
Total Bilirubin: 0.8 mg/dL (ref 0.3–1.2)
Total Protein: 7.6 g/dL (ref 6.5–8.1)

## 2018-04-26 MED ORDER — SODIUM CHLORIDE 0.9% FLUSH
10.0000 mL | INTRAVENOUS | Status: DC | PRN
  Administered 2018-04-26: 10 mL via INTRAVENOUS
  Filled 2018-04-26: qty 10

## 2018-04-26 MED ORDER — HEPARIN SOD (PORK) LOCK FLUSH 100 UNIT/ML IV SOLN: 500.0000 [IU] | Freq: Once | INTRAVENOUS | Status: DC

## 2018-04-26 MED ORDER — HEPARIN SOD (PORK) LOCK FLUSH 100 UNIT/ML IV SOLN
500.0000 [IU] | Freq: Once | INTRAVENOUS | Status: AC
  Administered 2018-04-26: 500 [IU] via INTRAVENOUS

## 2018-04-26 MED ORDER — IOHEXOL 300 MG/ML  SOLN
100.0000 mL | Freq: Once | INTRAMUSCULAR | Status: AC | PRN
  Administered 2018-04-26: 100 mL via INTRAVENOUS

## 2018-04-26 MED ORDER — SODIUM CHLORIDE (PF) 0.9 % IJ SOLN
INTRAMUSCULAR | Status: AC
  Filled 2018-04-26: qty 50

## 2018-04-26 MED ORDER — HEPARIN SOD (PORK) LOCK FLUSH 100 UNIT/ML IV SOLN
INTRAVENOUS | Status: AC
  Administered 2018-04-26: 500 [IU] via INTRAVENOUS
  Filled 2018-04-26: qty 5

## 2018-04-27 NOTE — Progress Notes (Signed)
Patient Care Team: [name], MD as PCP - General (Family Medicine) Nicholas Lose, MD as Consulting Physician (Hematology and Oncology) Delice Bison, Charlestine Massed, NP as Nurse Practitioner (Hematology and Oncology) Kyung Rudd, MD as Consulting Physician (Radiation Oncology) Rolm Bookbinder, MD as Consulting Physician (General Surgery)  DIAGNOSIS:    ICD-10-CM   1. 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, estrogen receptor positive ([place]) C50.011    Z17.0    C50.012     SUMMARY OF ONCOLOGIC HISTORY:   Bilateral breast cancer ([place])   08/16/2015 Mammogram    Left breast LIQ 4.2 cm, 4 mm oval mass 12:00, 1.2 cm mass left UOQ, enlarged left axillary lymph node    08/16/2015 Mammogram    Right breast UIQ: 3.9 cm irregular mass, 1.5 cm mass in the right LOQ, 9 mm mass right UOQ, 6 mm mass right LIQ, enlarged lymph node Rt axilla    08/20/2015 Initial Diagnosis    Right breast biopsy 1:00: IDC grade 2 ER 95%, PR 2%, Ki 67 15%; HER-2 negative ratio 1.04; 10:00: IDC grade 2-3 dissimilar to 1:00 biopsy 10:00: ER 95%, PR 0%, K67: 25%, HER-2 positive ratio 6.42 right axillary lymph node positive for metastatic cancer    08/20/2015 Procedure    Left biopsy 8:00: IDC with DCIS, grade 2; ER 95%, PR 95%, Ki-67 30%, HER-2 negative ratio 1.2; 12:00: IDC with DCIS, grade 1 ER 95%, PR 95%, Ki-67 10% HER-2 negative ratio 1.14 not similar to this biopsy, left axillary lymph node positive,     09/13/2015 - 01/17/2016 Neo-Adjuvant Chemotherapy    Neoadjuvant dose dense Adriamycin and Cytoxan followed by Taxol Herceptin Perjeta, followed by Herceptin maintenance    01/21/2016 Breast MRI    Bilateral breast masses significantly decreased in size with mild residual NME, complete resolution of numerous other nodules in both breasts, bilateral axillary lymph nodes and bilateral retropectoral lymph nodes decreased in size    02/11/2016 Surgery    Right mastectomy: IDC 2 nodules, 2.5, 1 cm,  margins negative, 3/7 lymph nodes positive T2 N1a stage II B, ER 95%, PR 2%, HER-2 negative, Ki-67 15% RCB burden 3.1 and 2.9 class II    02/11/2016 Surgery    Left mastectomy: IDC 3.1 cm with DCIS, margins negative, 0/8 lymph nodes, T2 N0 stage II a, RCB burden 2.18, class II, ER 100%, PR 95%, HER-2 negative ratio 1.2, Ki-67 30%     04/07/2016 - 05/15/2016 Radiation Therapy    Adjuvant radiation    06/26/2016 - 07/13/2017 Anti-estrogen oral therapy    Letrozole 2.5 mg daily    11/20/2016 Miscellaneous    Neratinib 240 mg daily discontinued 07/13/2017 when she presented with metastatic disease to the lungs    07/13/2017 Relapse/Recurrence    Progression of right and left hilar lymph nodes and bilateral lung nodules    07/13/2017 -  Anti-estrogen oral therapy    Ibrance with letrozole    10/24/2017 Imaging    CT chest abdomen pelvis: Interval decrease in the left hilar node.  The more superior left hilar node is slightly increased in size.  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  Lesion in the left acetabulum measures 5.5 cm, new lesion L3 extending to the left pedicle.  Perihilar nodule left lower lobe increased in size 2.8 cm from 1.8 cm, other nodules not changed.  Similar appearance of left hilar adenopathy,  small perinephric soft tissue nodule increased in size     CHIEF COMPLIANT:  Follow-up on Ibrance with letrozole  INTERVAL HISTORY: Felicia [name] is a 57 y.o. with above-mentioned history of metastatic breast cancer currently on Ibrance with letrozole. She presents to the clinic today to follow-up after starting Ibrance on 04/14/18. A CT CAP from 04/26/18 showed progression of bone metastases, a new lesion of the L3 vertebra, lymph node in the chest increased from 1.8cm to 2.8cm, and decrease in size of lung nodules. She presents to the clinic today with her daughter to  discuss further treatment options. She notes pain in her back around the sight of the L3 metastatic lesion. She reports this morning she started coughing up blood. She notes pain in her lower back around her kidneys when she takes Zyrtec for allergies. She reports numbness in her area of her port in the left chest which began after her CT scan.   REVIEW OF SYSTEMS:   Constitutional: Denies fevers, chills or abnormal weight loss Eyes: Denies blurriness of vision Ears, nose, mouth, throat, and face: Denies mucositis or sore throat Respiratory: Denies dyspnea or wheezes (+) coughing up blood Cardiovascular: Denies palpitation, chest discomfort Gastrointestinal:  Denies nausea, heartburn or change in bowel habits Skin: Denies abnormal skin rashes MSK: (+) back pain  Lymphatics: Denies new lymphadenopathy or easy bruising Neurological: Denies tingling or new weaknesses (+) numbness at port site Behavioral/Psych: Mood is stable, no new changes  Extremities: No lower extremity edema Breast: denies any pain or lumps or nodules in either breasts All other systems were reviewed with the patient and are negative.  I have reviewed the past medical history, past surgical history, social history and family history with the patient and they are unchanged from previous note.  ALLERGIES:  is allergic to no known allergies.  MEDICATIONS:  Current Outpatient Medications  Medication Sig Dispense Refill  . Calcium Carb-Cholecalciferol (CALCIUM 600+D) 600-800 MG-UNIT TABS Take 1 tablet by mouth daily.    . cetirizine (ZYRTEC) 10 MG tablet Take 1 tablet (10 mg total) by mouth daily. 30 tablet 1  . Cholecalciferol (VITAMIN D-3) 5000 units TABS Take 1 tablet by mouth daily.    Marland Kitchen letrozole (FEMARA) 2.5 MG tablet TAKE 1 TABLET BY MOUTH DAILY. 90 tablet 0  . lidocaine-prilocaine (EMLA) cream Apply 1 application topically as needed (Apply 2- hours prior to access of port). 30 g 6  . lisinopril (PRINIVIL,ZESTRIL) 20  MG tablet Take 1 tablet (20 mg total) by mouth daily. 30 tablet 6  . magic mouthwash w/lidocaine SOLN Take 5 mLs by mouth 3 (three) times daily as needed for mouth pain. 100 mL 0  . palbociclib (IBRANCE) 125 MG capsule Take 1 capsule (125 mg total) by mouth daily with breakfast. Take for 21 days on, 7 days off, repeated every 28 days. Take whole with food. 21 capsule 6  . potassium chloride (K-DUR,KLOR-CON) 10 MEQ tablet TAKE 2 TABLETS (20 MEQ TOTAL) BY MOUTH 2 TIMES DAILY. 120 tablet 2   No current facility-administered medications for this visit.    Facility-Administered Medications Ordered in Other Visits  Medication Dose Route Frequency Provider Last Rate Last Dose  . sodium chloride flush (NS) 0.9 % injection 10 mL  10 mL Intravenous PRN Nicholas Lose, MD   10 mL at 04/14/17 0831    PHYSICAL EXAMINATION: ECOG PERFORMANCE STATUS: 1 - Symptomatic but completely ambulatory  Vitals:   04/29/18 0850  BP: (!) 185/84  Pulse: 60  Resp: 17  Temp: 98.3 F (36.8 C)  SpO2: 99%   Filed Weights   04/29/18 0850  Weight: 218 lb 8 oz (99.1 kg)    GENERAL: alert, no distress and comfortable SKIN: skin color, texture, turgor are normal, no rashes or significant lesions EYES: normal, Conjunctiva are pink and non-injected, sclera clear OROPHARYNX: no exudate, no erythema and lips, buccal mucosa, and tongue normal  NECK: supple, thyroid normal size, non-tender, without nodularity LYMPH:  no palpable lymphadenopathy in the cervical, axillary or inguinal LUNGS: clear to auscultation and percussion with normal breathing effort HEART: regular rate & rhythm and no murmurs and no lower extremity edema ABDOMEN: abdomen soft, non-tender and normal bowel sounds MUSCULOSKELETAL: no cyanosis of digits and no clubbing  NEURO: alert & oriented x 3 with fluent speech, no focal motor/sensory deficits EXTREMITIES: No lower extremity edema  LABORATORY DATA:  I have reviewed the data as listed CMP Latest  Ref Rng & Units 04/26/2018 03/31/2018 01/26/2018  Glucose 70 - 99 mg/dL 116(H) 108(H) 109(H)  BUN 6 - 20 mg/dL _0 Creatinine 0.44 - 1.00 mg/dL 0.75 0.75 0.71  Sodium 135 - 145 mmol/L 140 138 140  Potassium 3.5 - 5.1 mmol/L 3.9 4.0 3.9  Chloride 98 - 111 mmol/L 107 105 107  CO2 22 - 32 mmol/L _1 Calcium 8.9 - 10.3 mg/dL 10.2 10.0 9.3  Total Protein 6.5 - 8.1 g/dL 7.6 7.6 7.4  Total Bilirubin 0.3 - 1.2 mg/dL 0.8 0.8 0.6  Alkaline Phos 38 - 126 U/L 70 66 62  AST 15 - 41 U/L 28 32 24  ALT 0 - 44 U/L 36 34 35    Lab Results  Component Value Date   WBC 3.5 (L) 04/26/2018   HGB 13.0 04/26/2018   HCT 37.7 04/26/2018   MCV 99.2 04/26/2018   PLT 221 04/26/2018   NEUTROABS 1.9 04/26/2018    ASSESSMENT & PLAN:  Bilateral breast cancer ([place]) Treatment Summary: 1. Neoadjuvant chemotherapy with either AC followed by THP started 09/13/2015 and completed 12 weeks of Taxol 11/29/2015, Herceptin Perjeta 6; now on Herceptin  2. Bilateral mastectomies and lymph node surgery 02/11/16 3. Adjuvant radiation 04/07/2016 to 05/15/2016 4. Adjuvant antiestrogen therapy with Letrozole started 06/26/2016 5. Neratinib after Herceptin maintenancediscontinued 07/13/2017 when she developed metastatic disease 6.  Ibrance with letrozole started March 2019 stopped 04/29/2018 due to progression  CT CAP: 04/26/2018 interval development of multiple lytic bone metastases.  Lesion in the left acetabulum measures 5.5 cm, new lesion L3 extending to the left pedicle.  Perihilar nodule left lower lobe increased in size 2.8 cm from 1.8 cm, other nodules not changed.  Similar appearance of left hilar adenopathy, small perinephric soft tissue nodule increased in size  Treatment plan: 1.  Referral to radiation oncology for palliative radiation 2.  Molecular testing 3.  I recommend adding Faslodex to letrozole therapy. If the molecular testing reveals any mutations then we may consider switching her treatment but  until then we will do Faslodex letrozole along with Ibrance.  Return to clinic every 2 weeks for loading dose of Faslodex in 1 month for follow-up with me.    No orders of the defined types were placed in this encounter.  The patient has a good understanding of the overall plan. she agrees with it. she will call with any problems that may develop before the next visit here.  Nicholas Lose, MD 04/29/2018   Julious Oka Dorshimer, am acting as Education administrator  for Nicholas Lose, MD.  I have reviewed the above documentation for accuracy and completeness, and I agree with the above.

## 2018-04-29 ENCOUNTER — Telehealth: Payer: Self-pay | Admitting: Hematology and Oncology

## 2018-04-29 ENCOUNTER — Inpatient Hospital Stay: Payer: Self-pay | Attending: Hematology and Oncology | Admitting: Hematology and Oncology

## 2018-04-29 ENCOUNTER — Other Ambulatory Visit: Payer: Self-pay

## 2018-04-29 ENCOUNTER — Other Ambulatory Visit: Payer: Self-pay | Admitting: Hematology and Oncology

## 2018-04-29 DIAGNOSIS — Z170 Estrogen receptor positive status [ER+]: Secondary | ICD-10-CM

## 2018-04-29 DIAGNOSIS — R590 Localized enlarged lymph nodes: Secondary | ICD-10-CM

## 2018-04-29 DIAGNOSIS — Z79899 Other long term (current) drug therapy: Secondary | ICD-10-CM

## 2018-04-29 DIAGNOSIS — C50011 Malignant neoplasm of nipple and areola, right female breast: Secondary | ICD-10-CM

## 2018-04-29 DIAGNOSIS — Z9013 Acquired absence of bilateral breasts and nipples: Secondary | ICD-10-CM

## 2018-04-29 DIAGNOSIS — C7951 Secondary malignant neoplasm of bone: Secondary | ICD-10-CM

## 2018-04-29 DIAGNOSIS — C50012 Malignant neoplasm of nipple and areola, left female breast: Secondary | ICD-10-CM

## 2018-04-29 MED ORDER — LETROZOLE 2.5 MG PO TABS: 2.5000 mg | ORAL_TABLET | Freq: Every day | ORAL | 0 refills | Status: DC

## 2018-04-29 NOTE — Assessment & Plan Note (Signed)
Treatment Summary: 1. Neoadjuvant chemotherapy with either AC followed by THP started 09/13/2015 and completed 12 weeks of Taxol 11/29/2015, Herceptin Perjeta 6; now on Herceptin  2. Bilateral mastectomies and lymph node surgery 02/11/16 3. Adjuvant radiation 04/07/2016 to 05/15/2016 4. Adjuvant antiestrogen therapy with Letrozole started 06/26/2016 5. Neratinib after Herceptin maintenancediscontinued 07/13/2017 when she developed metastatic disease 6.  Ibrance with letrozole started March 2019 stopped 04/29/2018 due to progression  CT CAP: 04/26/2018 interval development of multiple lytic bone metastases.  Lesion in the left acetabulum measures 5.5 cm, new lesion L3 extending to the left pedicle.  Perihilar nodule left lower lobe increased in size 2.8 cm from 1.8 cm, other nodules not changed.  Similar appearance of left hilar adenopathy, small perinephric soft tissue nodule increased in size  Treatment plan: 1.  Biopsy of the left acetabular lesion: We will request interventional radiology 2.  Referral to radiation oncology for palliative radiation 3.  Molecular testing 4.  New treatment plan based upon biopsy results  Return to clinic after biopsy to discuss pathology report and new treatment plan.

## 2018-04-29 NOTE — Telephone Encounter (Signed)
Gave avs and calendar ° °

## 2018-05-01 ENCOUNTER — Inpatient Hospital Stay: Payer: Self-pay

## 2018-05-01 VITALS — BP 139/79 | HR 59 | Temp 97.8°F | Resp 16

## 2018-05-01 DIAGNOSIS — Z95828 Presence of other vascular implants and grafts: Secondary | ICD-10-CM

## 2018-05-01 MED ORDER — DENOSUMAB 120 MG/1.7ML ~~LOC~~ SOLN
120.0000 mg | Freq: Once | SUBCUTANEOUS | Status: AC
  Administered 2018-05-01: 120 mg via SUBCUTANEOUS

## 2018-05-01 MED ORDER — FULVESTRANT 250 MG/5ML IM SOLN
INTRAMUSCULAR | Status: AC
  Filled 2018-05-01: qty 5

## 2018-05-01 MED ORDER — FULVESTRANT 250 MG/5ML IM SOLN
500.0000 mg | Freq: Once | INTRAMUSCULAR | Status: AC
  Administered 2018-05-01: 500 mg via INTRAMUSCULAR

## 2018-05-01 MED ORDER — DENOSUMAB 120 MG/1.7ML ~~LOC~~ SOLN
SUBCUTANEOUS | Status: AC
  Filled 2018-05-01: qty 1.7

## 2018-05-01 NOTE — Patient Instructions (Addendum)
Fulvestrant injection What is this medicine? FULVESTRANT (ful VES trant) blocks the effects of estrogen. It is used to treat breast cancer. This medicine may be used for other purposes; ask your health care provider or pharmacist if you have questions. COMMON BRAND NAME(S): FASLODEX What should I tell my health care provider before I take this medicine? They need to know if you have any of these conditions: -bleeding disorders -liver disease -low blood counts, like low white cell, platelet, or red cell counts -an unusual or allergic reaction to fulvestrant, other medicines, foods, dyes, or preservatives -pregnant or trying to get pregnant -breast-feeding How should I use this medicine? This medicine is for injection into a muscle. It is usually given by a health care professional in a hospital or clinic setting. Talk to your pediatrician regarding the use of this medicine in children. Special care may be needed. Overdosage: If you think you have taken too much of this medicine contact a poison control center or emergency room at once. NOTE: This medicine is only for you. Do not share this medicine with others. What if I miss a dose? It is important not to miss your dose. Call your doctor or health care professional if you are unable to keep an appointment. What may interact with this medicine? -medicines that treat or prevent blood clots like warfarin, enoxaparin, dalteparin, apixaban, dabigatran, and rivaroxaban This list may not describe all possible interactions. Give your health care provider a list of all the medicines, herbs, non-prescription drugs, or dietary supplements you use. Also tell them if you smoke, drink alcohol, or use illegal drugs. Some items may interact with your medicine. What should I watch for while using this medicine? Your condition will be monitored carefully while you are receiving this medicine. You will need important blood work done while you are taking this  medicine. Do not become pregnant while taking this medicine or for at least 1 year after stopping it. Women of child-bearing potential will need to have a negative pregnancy test before starting this medicine. Women should inform their doctor if they wish to become pregnant or think they might be pregnant. There is a potential for serious side effects to an unborn child. Men should inform their doctors if they wish to father a child. This medicine may lower sperm counts. Talk to your health care professional or pharmacist for more information. Do not breast-feed an infant while taking this medicine or for 1 year after the last dose. What side effects may I notice from receiving this medicine? Side effects that you should report to your doctor or health care professional as soon as possible: -allergic reactions like skin rash, itching or hives, swelling of the face, lips, or tongue -feeling faint or lightheaded, falls -pain, tingling, numbness, or weakness in the legs -signs and symptoms of infection like fever or chills; cough; flu-like symptoms; sore throat -vaginal bleeding Side effects that usually do not require medical attention (report to your doctor or health care professional if they continue or are bothersome): -aches, pains -constipation -diarrhea -headache -hot flashes -nausea, vomiting -pain at site where injected -stomach pain This list may not describe all possible side effects. Call your doctor for medical advice about side effects. You may report side effects to FDA at 1-800-FDA-1088. Where should I keep my medicine? This drug is given in a hospital or clinic and will not be stored at home. NOTE: This sheet is a summary. It may not cover all possible information. If you  have questions about this medicine, talk to your doctor, pharmacist, or health care provider.  2019 Elsevier/Gold Standard (2017-07-23 11:34:41) Denosumab injection What is this medicine? DENOSUMAB (den oh  sue mab) slows bone breakdown. Prolia is used to treat osteoporosis in women after menopause and in men, and in people who are taking corticosteroids for 6 months or more. Delton See is used to treat a high calcium level due to cancer and to prevent bone fractures and other bone problems caused by multiple myeloma or cancer bone metastases. Delton See is also used to treat giant cell tumor of the bone. This medicine may be used for other purposes; ask your health care provider or pharmacist if you have questions. COMMON BRAND NAME(S): Prolia, XGEVA What should I tell my health care provider before I take this medicine? They need to know if you have any of these conditions: -dental disease -having surgery or tooth extraction -infection -kidney disease -low levels of calcium or Vitamin D in the blood -malnutrition -on hemodialysis -skin conditions or sensitivity -thyroid or parathyroid disease -an unusual reaction to denosumab, other medicines, foods, dyes, or preservatives -pregnant or trying to get pregnant -breast-feeding How should I use this medicine? This medicine is for injection under the skin. It is given by a health care professional in a hospital or clinic setting. A special MedGuide will be given to you before each treatment. Be sure to read this information carefully each time. For Prolia, talk to your pediatrician regarding the use of this medicine in children. Special care may be needed. For Delton See, talk to your pediatrician regarding the use of this medicine in children. While this drug may be prescribed for children as young as 13 years for selected conditions, precautions do apply. Overdosage: If you think you have taken too much of this medicine contact a poison control center or emergency room at once. NOTE: This medicine is only for you. Do not share this medicine with others. What if I miss a dose? It is important not to miss your dose. Call your doctor or health care professional if  you are unable to keep an appointment. What may interact with this medicine? Do not take this medicine with any of the following medications: -other medicines containing denosumab This medicine may also interact with the following medications: -medicines that lower your chance of fighting infection -steroid medicines like prednisone or cortisone This list may not describe all possible interactions. Give your health care provider a list of all the medicines, herbs, non-prescription drugs, or dietary supplements you use. Also tell them if you smoke, drink alcohol, or use illegal drugs. Some items may interact with your medicine. What should I watch for while using this medicine? Visit your doctor or health care professional for regular checks on your progress. Your doctor or health care professional may order blood tests and other tests to see how you are doing. Call your doctor or health care professional for advice if you get a fever, chills or sore throat, or other symptoms of a cold or flu. Do not treat yourself. This drug may decrease your body's ability to fight infection. Try to avoid being around people who are sick. You should make sure you get enough calcium and vitamin D while you are taking this medicine, unless your doctor tells you not to. Discuss the foods you eat and the vitamins you take with your health care professional. See your dentist regularly. Brush and floss your teeth as directed. Before you have any dental work  done, tell your dentist you are receiving this medicine. Do not become pregnant while taking this medicine or for 5 months after stopping it. Talk with your doctor or health care professional about your birth control options while taking this medicine. Women should inform their doctor if they wish to become pregnant or think they might be pregnant. There is a potential for serious side effects to an unborn child. Talk to your health care professional or pharmacist for  more information. What side effects may I notice from receiving this medicine? Side effects that you should report to your doctor or health care professional as soon as possible: -allergic reactions like skin rash, itching or hives, swelling of the face, lips, or tongue -bone pain -breathing problems -dizziness -jaw pain, especially after dental work -redness, blistering, peeling of the skin -signs and symptoms of infection like fever or chills; cough; sore throat; pain or trouble passing urine -signs of low calcium like fast heartbeat, muscle cramps or muscle pain; pain, tingling, numbness in the hands or feet; seizures -unusual bleeding or bruising -unusually weak or tired Side effects that usually do not require medical attention (report to your doctor or health care professional if they continue or are bothersome): -constipation -diarrhea -headache -joint pain -loss of appetite -muscle pain -runny nose -tiredness -upset stomach This list may not describe all possible side effects. Call your doctor for medical advice about side effects. You may report side effects to FDA at 1-800-FDA-1088. Where should I keep my medicine? This medicine is only given in a clinic, doctor's office, or other health care setting and will not be stored at home. NOTE: This sheet is a summary. It may not cover all possible information. If you have questions about this medicine, talk to your doctor, pharmacist, or health care provider.  2019 Elsevier/Gold Standard (2017-08-21 16:10:44) Denosumab injection What is this medicine? DENOSUMAB (den oh sue mab) slows bone breakdown. Prolia is used to treat osteoporosis in women after menopause and in men, and in people who are taking corticosteroids for 6 months or more. Delton See is used to treat a high calcium level due to cancer and to prevent bone fractures and other bone problems caused by multiple myeloma or cancer bone metastases. Delton See is also used to treat  giant cell tumor of the bone. This medicine may be used for other purposes; ask your health care provider or pharmacist if you have questions. COMMON BRAND NAME(S): Prolia, XGEVA What should I tell my health care provider before I take this medicine? They need to know if you have any of these conditions: -dental disease -having surgery or tooth extraction -infection -kidney disease -low levels of calcium or Vitamin D in the blood -malnutrition -on hemodialysis -skin conditions or sensitivity -thyroid or parathyroid disease -an unusual reaction to denosumab, other medicines, foods, dyes, or preservatives -pregnant or trying to get pregnant -breast-feeding How should I use this medicine? This medicine is for injection under the skin. It is given by a health care professional in a hospital or clinic setting. A special MedGuide will be given to you before each treatment. Be sure to read this information carefully each time. For Prolia, talk to your pediatrician regarding the use of this medicine in children. Special care may be needed. For Delton See, talk to your pediatrician regarding the use of this medicine in children. While this drug may be prescribed for children as young as 13 years for selected conditions, precautions do apply. Overdosage: If you think you have taken  too much of this medicine contact a poison control center or emergency room at once. NOTE: This medicine is only for you. Do not share this medicine with others. What if I miss a dose? It is important not to miss your dose. Call your doctor or health care professional if you are unable to keep an appointment. What may interact with this medicine? Do not take this medicine with any of the following medications: -other medicines containing denosumab This medicine may also interact with the following medications: -medicines that lower your chance of fighting infection -steroid medicines like prednisone or cortisone This list  may not describe all possible interactions. Give your health care provider a list of all the medicines, herbs, non-prescription drugs, or dietary supplements you use. Also tell them if you smoke, drink alcohol, or use illegal drugs. Some items may interact with your medicine. What should I watch for while using this medicine? Visit your doctor or health care professional for regular checks on your progress. Your doctor or health care professional may order blood tests and other tests to see how you are doing. Call your doctor or health care professional for advice if you get a fever, chills or sore throat, or other symptoms of a cold or flu. Do not treat yourself. This drug may decrease your body's ability to fight infection. Try to avoid being around people who are sick. You should make sure you get enough calcium and vitamin D while you are taking this medicine, unless your doctor tells you not to. Discuss the foods you eat and the vitamins you take with your health care professional. See your dentist regularly. Brush and floss your teeth as directed. Before you have any dental work done, tell your dentist you are receiving this medicine. Do not become pregnant while taking this medicine or for 5 months after stopping it. Talk with your doctor or health care professional about your birth control options while taking this medicine. Women should inform their doctor if they wish to become pregnant or think they might be pregnant. There is a potential for serious side effects to an unborn child. Talk to your health care professional or pharmacist for more information. What side effects may I notice from receiving this medicine? Side effects that you should report to your doctor or health care professional as soon as possible: -allergic reactions like skin rash, itching or hives, swelling of the face, lips, or tongue -bone pain -breathing problems -dizziness -jaw pain, especially after dental  work -redness, blistering, peeling of the skin -signs and symptoms of infection like fever or chills; cough; sore throat; pain or trouble passing urine -signs of low calcium like fast heartbeat, muscle cramps or muscle pain; pain, tingling, numbness in the hands or feet; seizures -unusual bleeding or bruising -unusually weak or tired Side effects that usually do not require medical attention (report to your doctor or health care professional if they continue or are bothersome): -constipation -diarrhea -headache -joint pain -loss of appetite -muscle pain -runny nose -tiredness -upset stomach This list may not describe all possible side effects. Call your doctor for medical advice about side effects. You may report side effects to FDA at 1-800-FDA-1088. Where should I keep my medicine? This medicine is only given in a clinic, doctor's office, or other health care setting and will not be stored at home. NOTE: This sheet is a summary. It may not cover all possible information. If you have questions about this medicine, talk to your doctor, pharmacist,  or health care provider.  2019 Elsevier/Gold Standard (2017-08-21 16:10:44)

## 2018-05-03 ENCOUNTER — Telehealth: Payer: Self-pay | Admitting: *Deleted

## 2018-05-03 NOTE — Progress Notes (Signed)
Histology and Location of Primary Cancer:   -Previous bilateral mastectomy 02/11/2016 -Anti-estrogen 06/26/2016-07/13/2017 letrozole 2.5 mg daily -Relapse/Recurrence 07/13/2017 progression of right and left hilar lymph nodes and bilateral lung nodules. -Ibrance added with the letrozole.  CT CAP 04/26/2018: Posterior column of the left acetabulum measuring 2.7 x 3.5 x 5.5 cm.  L3 vertebra there is a lytic lesion which extends into the left pedicle measuring 2.4 cm.  CT CAP 10/24/2017: Interval decrease in the left hilar node.  The more superior left hilar node is slightly increased in size.  Interval decrease in size of multiple lung nodules.   Sites of Visceral and Bony Metastatic Disease: L acetabulum lesion, 5.5 cm.  L3 lesion   Past/Anticipated chemotherapy by medical oncology, if any:   Pain on a scale of 0-10 is:    Ambulatory status? Walker? Wheelchair?: Using cane at all times.  May transition to a walker.  BP (!) 142/93 (BP Location: Left Leg, Patient Position: Standing)   Pulse 73   Temp (!) 97.3 F (36.3 C) (Oral)   Resp 20   Wt 220 lb 9.6 oz (100.1 kg)   SpO2 99%   BMI 43.08 kg/m    Wt Readings from Last 3 Encounters:  05/04/18 220 lb 9.6 oz (100.1 kg)  04/29/18 218 lb 8 oz (99.1 kg)  03/11/18 220 lb (99.8 kg)    SAFETY ISSUES:  Prior radiation? Post mastectomy radiation 04/07/2016-05/15/2016.  Pacemaker/ICD? No  Possible current pregnancy? Tubal ligation.  Is the patient on methotrexate? No  Current Complaints / other details:

## 2018-05-03 NOTE — Telephone Encounter (Signed)
I have try to reach out to this pt on 04-29-18, 04-30-18, 05-03-18 have not got a call back from the pt

## 2018-05-04 ENCOUNTER — Ambulatory Visit
Admission: RE | Admit: 2018-05-04 | Discharge: 2018-05-04 | Disposition: A | Discharge status: Home / Self Care | Payer: Self-pay | Source: Ambulatory Visit | Attending: Radiation Oncology | Admitting: Radiation Oncology

## 2018-05-04 ENCOUNTER — Encounter: Payer: Self-pay | Admitting: Radiation Oncology

## 2018-05-04 ENCOUNTER — Inpatient Hospital Stay: Payer: Self-pay

## 2018-05-04 VITALS — BP 142/93 | HR 73 | Temp 97.3°F | Resp 20 | Wt 220.6 lb

## 2018-05-04 DIAGNOSIS — Z170 Estrogen receptor positive status [ER+]: Secondary | ICD-10-CM | POA: Insufficient documentation

## 2018-05-04 DIAGNOSIS — C7951 Secondary malignant neoplasm of bone: Secondary | ICD-10-CM | POA: Insufficient documentation

## 2018-05-04 DIAGNOSIS — C50012 Malignant neoplasm of nipple and areola, left female breast: Secondary | ICD-10-CM

## 2018-05-04 DIAGNOSIS — C50011 Malignant neoplasm of nipple and areola, right female breast: Secondary | ICD-10-CM

## 2018-05-04 DIAGNOSIS — C50912 Malignant neoplasm of unspecified site of left female breast: Secondary | ICD-10-CM | POA: Insufficient documentation

## 2018-05-04 DIAGNOSIS — Z79899 Other long term (current) drug therapy: Secondary | ICD-10-CM | POA: Insufficient documentation

## 2018-05-04 LAB — CMP (CANCER CENTER ONLY)
ALBUMIN: 3.8 g/dL (ref 3.5–5.0)
ALT: 31 U/L (ref 0–44)
AST: 28 U/L (ref 15–41)
Alkaline Phosphatase: 74 U/L (ref 38–126)
Anion gap: 9 (ref 5–15)
BUN: 13 mg/dL (ref 6–20)
CO2: 24 mmol/L (ref 22–32)
Calcium: 9.4 mg/dL (ref 8.9–10.3)
Chloride: 107 mmol/L (ref 98–111)
Creatinine: 0.66 mg/dL (ref 0.44–1.00)
GFR, Est AFR Am: >60 mL/min (ref >60)
GFR, Estimated: >60 mL/min (ref >60)
GLUCOSE: 94 mg/dL (ref 70–99)
POTASSIUM: 3.8 mmol/L (ref 3.5–5.1)
SODIUM: 140 mmol/L (ref 135–145)
Total Bilirubin: 0.4 mg/dL (ref 0.3–1.2)
Total Protein: 7.3 g/dL (ref 6.5–8.1)

## 2018-05-04 LAB — CBC WITH DIFFERENTIAL (CANCER CENTER ONLY)
Abs Immature Granulocytes: 0.02 10*3/uL (ref 0.00–0.07)
Basophils Absolute: 0 10*3/uL (ref 0.0–0.1)
Basophils Relative: 1 %
Eosinophils Absolute: 0 10*3/uL (ref 0.0–0.5)
Eosinophils Relative: 1 %
HEMATOCRIT: 35.6 % — AB (ref 36.0–46.0)
HEMOGLOBIN: 12.1 g/dL (ref 12.0–15.0)
Immature Granulocytes: 1 %
LYMPHS PCT: 42 %
Lymphs Abs: 1.6 10*3/uL (ref 0.7–4.0)
MCH: 33.7 pg (ref 26.0–34.0)
MCHC: 34 g/dL (ref 30.0–36.0)
MCV: 99.2 fL (ref 80.0–100.0)
Monocytes Absolute: 0.3 10*3/uL (ref 0.1–1.0)
Monocytes Relative: 7 %
NEUTROS ABS: 1.8 10*3/uL (ref 1.7–7.7)
Neutrophils Relative %: 48 %
Platelet Count: 217 10*3/uL (ref 150–400)
RBC: 3.59 MIL/uL — ABNORMAL LOW (ref 3.87–5.11)
RDW: 12.8 % (ref 11.5–15.5)
WBC Count: 3.7 10*3/uL — ABNORMAL LOW (ref 4.0–10.5)
nRBC: 0 % (ref 0.0–0.2)

## 2018-05-04 MED ORDER — HYDROCODONE-ACETAMINOPHEN 5-325 MG PO TABS: 1.0000 | ORAL_TABLET | Freq: Four times a day (QID) | ORAL | 0 refills | Status: DC | PRN

## 2018-05-04 NOTE — Patient Instructions (Signed)

## 2018-05-05 ENCOUNTER — Inpatient Hospital Stay: Payer: Self-pay

## 2018-05-05 ENCOUNTER — Ambulatory Visit: Admission: RE | Admit: 2018-05-05 | Payer: Self-pay | Source: Ambulatory Visit | Admitting: Radiation Oncology

## 2018-05-05 ENCOUNTER — Ambulatory Visit
Admission: RE | Admit: 2018-05-05 | Discharge: 2018-05-05 | Disposition: A | Discharge status: Home / Self Care | Payer: Self-pay | Source: Ambulatory Visit | Attending: Radiation Oncology | Admitting: Radiation Oncology

## 2018-05-05 DIAGNOSIS — C7951 Secondary malignant neoplasm of bone: Secondary | ICD-10-CM | POA: Insufficient documentation

## 2018-05-05 DIAGNOSIS — Z510 Encounter for antineoplastic radiation therapy: Secondary | ICD-10-CM | POA: Insufficient documentation

## 2018-05-05 NOTE — Progress Notes (Signed)
  Radiation Oncology         [phone] ________________________________  Name: Felicia [name] MRN: [id number]  Date: 05/05/2018  DOB: April 16, 1961  SIMULATION AND TREATMENT PLANNING NOTE  DIAGNOSIS:     ICD-10-CM   1. 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  L2-4 2.  Left pelvis  NARRATIVE:  The patient was brought to the [facility].  Identity was confirmed.  All relevant records and images related to the planned course of therapy were reviewed.   Written consent to proceed with treatment was confirmed which was freely given after reviewing the details related to the planned course of therapy had been reviewed with the patient.  Then, the patient was set-up in a stable reproducible  supine position for radiation therapy.  CT images were obtained.  Surface markings were placed.    Medically necessary complex treatment device(s) for immobilization:  Vac-lock bag.   The CT images were loaded into the planning software.  Then the target and avoidance structures were contoured.  Treatment planning then occurred.  The radiation prescription was entered and confirmed.  A total of 5 complex treatment devices were fabricated which relate to the designed radiation treatment fields. Each of these customized fields/ complex treatment devices will be used on a daily basis during the radiation course. I have requested : Isodose Plan.   PLAN:  The patient will receive 30 Gy in 10 fractions.  ________________________________   Jodelle Gross, MD, PhD

## 2018-05-05 NOTE — Progress Notes (Signed)
Radiation Oncology         [id number]) [phone] ________________________________  Name: Felicia [name] MRN: [id number]  Date: 05/04/2018  DOB: 08-03-1960  Reconsultation Note  CC: [name], MD  Nicholas Lose, MD  Diagnosis:   Bilateral breast cancer  Interval Since Last Radiation:  2 years  04/07/16 - 05/27/16: Left Chest Wall treated to 50.4 Gy in 28 fractions, and then Boosted an additional 10 Gy in 5 fractions. Right Chest Wall treated to 50.4 Gy in 28 fractions, and then Boosted an additional 10 Gy in 5 fractions.   Narrative: In summary this is a pleasant 58 y.o. Felicia [name] speaking female with a history of bilateral ER positive breast cancer. She was treated with definitive therapy including radiotherapy to bilateral chest walls and regional nodes which she completed in January 2018. She was found ot have recurrent disease in March 2019 with disease in her lungs and hilar nodes. She has been on Letrozole and Ibrance. She had recent imaging on 04/26/18 revealing stability of her disease in the chest since 09/2017 scan, but had a 2.7 x 3.5 x 5.5 cm lytic lesion in the left acetabulum and a 2.4 cm lesion in the L3 vertebral body extending toward the left pedicle. She comes today to discuss options of palliative radiotherapy.            On review of systems, the patient reports that she is doing well overall but has have progressive pain in her left hip that has been present since October. She is not taking any medication for this and accepts when this is offered. She denies any chest pain, shortness of breath, cough, fevers, chills, night sweats, unintended weight changes. She denies any bowel or bladder disturbances, and denies abdominal pain, nausea or vomiting. She denies any new musculoskeletal or joint aches or pains, new skin lesions or concerns. A complete review of systems is obtained and is otherwise negative.  Past Medical History:  Past Medical History:  Diagnosis Date    . Cancer ([place])    breast  . GERD (gastroesophageal reflux disease)     Past Surgical History: Past Surgical History:  Procedure Laterality Date  . MASTECTOMY MODIFIED RADICAL Bilateral 02/11/2016  . MASTECTOMY MODIFIED RADICAL Bilateral 02/11/2016   Procedure: BILATERAL MASTECTOMY MODIFIED RADICAL;  Surgeon: Rolm Bookbinder, MD;  Location: [facility];  Service: General;  Laterality: Bilateral;  . PORTACATH PLACEMENT N/A 09/04/2015   Procedure: INSERTION PORT-A-CATH WITH [place];  Surgeon: Rolm Bookbinder, MD;  Location: [facility];  Service: General;  Laterality: N/A;  . TUBAL LIGATION      Social History:  Social History   Socioeconomic History  . Marital status: Married    Spouse name: Elon Jester Real  . Number of children: 3  . Years of education: Not on file  . Highest education level: Not on file  Occupational History  . Not on file  Social Needs  . Financial resource strain: Not on file  . Food insecurity:    Worry: Not on file    Inability: Not on file  . Transportation needs:    Medical: No    Non-medical: No  Tobacco Use  . Smoking status: Never Smoker  . Smokeless tobacco: Never Used  Substance and Sexual Activity  . Alcohol use: No  . Drug use: No  . Sexual activity: Yes    Birth control/protection: Surgical  Lifestyle  . Physical activity:    Days per week: Not on file  Minutes per session: Not on file  . Stress: Not on file  Relationships  . Social connections:    Talks on phone: Not on file    Gets together: Not on file    Attends religious service: Not on file    Active member of club or organization: Not on file    Attends meetings of clubs or organizations: Not on file    Relationship status: Not on file  . Intimate partner violence:    Fear of current or ex partner: Not on file    Emotionally abused: Not on file    Physically abused: Not on file    Forced sexual activity: Not on file  Other Topics Concern  . Not on file   Social History Narrative   3 children    Felicia [name] age 58, lives in [place]    Felicia [name] age 45 lives in [place]   Felicia [name] age 1 lives in [place]    Family History: Family History  Problem Relation Age of Onset  . Diabetes Mother      ALLERGIES:  is allergic to no known allergies.  Meds: Current Outpatient Medications  Medication Sig Dispense Refill  . Calcium Carb-Cholecalciferol (CALCIUM 600+D) 600-800 MG-UNIT TABS Take 1 tablet by mouth daily.    . Cholecalciferol (VITAMIN D-3) 5000 units TABS Take 1 tablet by mouth daily.    Marland Kitchen letrozole (FEMARA) 2.5 MG tablet TAKE 1 TABLET BY MOUTH DAILY. 90 tablet 0  . lidocaine-prilocaine (EMLA) cream Apply 1 application topically as needed (Apply 2- hours prior to access of port). 30 g 6  . lisinopril (PRINIVIL,ZESTRIL) 20 MG tablet Take 1 tablet (20 mg total) by mouth daily. 30 tablet 6  . palbociclib (IBRANCE) 125 MG capsule Take 1 capsule (125 mg total) by mouth daily with breakfast. Take for 21 days on, 7 days off, repeated every 28 days. Take whole with food. 21 capsule 6  . potassium chloride (K-DUR,KLOR-CON) 10 MEQ tablet TAKE 2 TABLETS (20 MEQ TOTAL) BY MOUTH 2 TIMES DAILY. 120 tablet 2  . HYDROcodone-acetaminophen (NORCO) 5-325 MG tablet Take 1-2 tablets by mouth every 6 (six) hours as needed for moderate pain. 60 tablet 0  . magic mouthwash w/lidocaine SOLN Take 5 mLs by mouth 3 (three) times daily as needed for mouth pain. (Patient not taking: Reported on 05/04/2018) 100 mL 0   No current facility-administered medications for this encounter.    Facility-Administered Medications Ordered in Other Encounters  Medication Dose Route Frequency Provider Last Rate Last Dose  . sodium chloride flush (NS) 0.9 % injection 10 mL  10 mL Intravenous PRN Nicholas Lose, MD   10 mL at 04/14/17 0831    Physical Findings:  weight is 220 lb 9.6 oz (100.1 kg). Her oral temperature is 97.3 F (36.3 C) (abnormal). Her blood  pressure is 142/93 (abnormal) and her pulse is 73. Her respiration is 20 and oxygen saturation is 99%.  Pain Assessment Pain Loc: Buttocks(Left buttocks down her leg, all around.)/10 In general this is a well appearing Hispanic female in no acute distress. She's alert and oriented x4 and appropriate throughout the examination. Cardiopulmonary assessment is negative for acute distress and she exhibits normal effort.  Lab Findings: Lab Results  Component Value Date   WBC 3.7 (L) 05/04/2018   HGB 12.1 05/04/2018   HCT 35.6 (L) 05/04/2018   MCV 99.2 05/04/2018   PLT 217 05/04/2018     Radiographic Findings: Ct Chest W Contrast  Result Date: 04/26/2018 CLINICAL DATA:  History of bilateral breast cancer status post mastectomy and chemotherapy and radiation therapy. EXAM: CT CHEST, ABDOMEN, AND PELVIS WITH CONTRAST TECHNIQUE: Multidetector CT imaging of the chest, abdomen and pelvis was performed following the standard protocol during bolus administration of intravenous contrast. CONTRAST:  182mL OMNIPAQUE IOHEXOL 300 MG/ML  SOLN COMPARISON:  10/21/2017 FINDINGS: CT CHEST FINDINGS Cardiovascular: Normal heart size.  No pericardial effusion. Mediastinum/Nodes: Previous bilateral axillary nodal dissection. No supraclavicular, mediastinal or right hilar adenopathy identified. Index left hilar lymph node measures 1.3 cm, image 31/2. Previously 1.6 cm. Lungs/Pleura: No pleural effusion. Within the central right lower lobe there is a perihilar nodule measuring 2.8 x 2.0 cm, image 34/2. Previously 1.8 x 1.8 cm. Right lower lobe subpleural nodule measures 0.6 cm, image 81/6. Previously 0.8 cm. Within the anterior left lung base there is a nodule measuring 1.1 cm, image 101/6. Unchanged in size from previous exam. Posteromedial left lower lobe lung nodule measures 0.5 cm, image 108/6. Stable from previous exam. Musculoskeletal: Status post bilateral mastectomy. CT ABDOMEN PELVIS FINDINGS Hepatobiliary: No focal  liver abnormality is seen. No gallstones, gallbladder wall thickening, or biliary dilatation. Pancreas: Unremarkable. No pancreatic ductal dilatation or surrounding inflammatory changes. Spleen: Normal in size without focal abnormality. Adrenals/Urinary Tract: Normal appearance of the adrenal glands. No unchanged calcification within the medial cortex of the right kidney. Left kidney cyst is stable measuring 1.4 cm. No mass or hydronephrosis identified bilaterally. The urinary bladder appears within normal limits. Stomach/Bowel: Stomach appears normal. The small bowel loops have a normal course and caliber without obstruction. No pathologic dilatation of the colon. Vascular/Lymphatic: No significant vascular findings are present. No enlarged abdominal or pelvic lymph nodes. Reproductive: Uterus and bilateral adnexa are unremarkable. Other: There is a perinephric soft tissue nodule adjacent to the right kidney measuring 0.9 cm, image 67/2. Previously 0.6 cm. Musculoskeletal: Multifocal lytic bone metastases are identified within the lumbar spine and bony pelvis. The largest lesion involves the posterior column of the left acetabulum and is new from the previous exam. This measures 2.7 by 3.5 by 5.5 cm and may be of orthopedic significance, image 109/4 and image 108/2. Several adjacent smaller lytic lesions are noted within the nearby left inferior pubic ramus. Within the L3 vertebra there is a lytic lesion which extends into the left pedicle. This measures 2.4 cm, image 70/2. IMPRESSION: 1. 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 The largest lesion involves the posterior column of the left acetabulum and may be of orthopedic significance. There is also a new lucent lesion involving the L3 vertebra which extends into the left pedicle. 2. Perihilar nodule within the left lower lobe appears increased in size from previous exam. Other scattered small nodules are not significantly changed in the  interval. 3. Similar appearance of left hilar adenopathy. 4. Small perinephric soft tissue nodule is identified adjacent to the right kidney. Increased in size from previous exam. Electronically Signed   By: Kerby Moors M.D.   On: 04/26/2018 13:50   Ct Abdomen Pelvis W Contrast  Result Date: 04/26/2018 CLINICAL DATA:  History of bilateral breast cancer status post mastectomy and chemotherapy and radiation therapy. EXAM: CT CHEST, ABDOMEN, AND PELVIS WITH CONTRAST TECHNIQUE: Multidetector CT imaging of the chest, abdomen and pelvis was performed following the standard protocol during bolus administration of intravenous contrast. CONTRAST:  174mL OMNIPAQUE IOHEXOL 300 MG/ML  SOLN COMPARISON:  10/21/2017 FINDINGS: CT CHEST FINDINGS Cardiovascular: Normal heart size.  No pericardial effusion. Mediastinum/Nodes:  Previous bilateral axillary nodal dissection. No supraclavicular, mediastinal or right hilar adenopathy identified. Index left hilar lymph node measures 1.3 cm, image 31/2. Previously 1.6 cm. Lungs/Pleura: No pleural effusion. Within the central right lower lobe there is a perihilar nodule measuring 2.8 x 2.0 cm, image 34/2. Previously 1.8 x 1.8 cm. Right lower lobe subpleural nodule measures 0.6 cm, image 81/6. Previously 0.8 cm. Within the anterior left lung base there is a nodule measuring 1.1 cm, image 101/6. Unchanged in size from previous exam. Posteromedial left lower lobe lung nodule measures 0.5 cm, image 108/6. Stable from previous exam. Musculoskeletal: Status post bilateral mastectomy. CT ABDOMEN PELVIS FINDINGS Hepatobiliary: No focal liver abnormality is seen. No gallstones, gallbladder wall thickening, or biliary dilatation. Pancreas: Unremarkable. No pancreatic ductal dilatation or surrounding inflammatory changes. Spleen: Normal in size without focal abnormality. Adrenals/Urinary Tract: Normal appearance of the adrenal glands. No unchanged calcification within the medial cortex of the  right kidney. Left kidney cyst is stable measuring 1.4 cm. No mass or hydronephrosis identified bilaterally. The urinary bladder appears within normal limits. Stomach/Bowel: Stomach appears normal. The small bowel loops have a normal course and caliber without obstruction. No pathologic dilatation of the colon. Vascular/Lymphatic: No significant vascular findings are present. No enlarged abdominal or pelvic lymph nodes. Reproductive: Uterus and bilateral adnexa are unremarkable. Other: There is a perinephric soft tissue nodule adjacent to the right kidney measuring 0.9 cm, image 67/2. Previously 0.6 cm. Musculoskeletal: Multifocal lytic bone metastases are identified within the lumbar spine and bony pelvis. The largest lesion involves the posterior column of the left acetabulum and is new from the previous exam. This measures 2.7 by 3.5 by 5.5 cm and may be of orthopedic significance, image 109/4 and image 108/2. Several adjacent smaller lytic lesions are noted within the nearby left inferior pubic ramus. Within the L3 vertebra there is a lytic lesion which extends into the left pedicle. This measures 2.4 cm, image 70/2. IMPRESSION: 1. 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 The largest lesion involves the posterior column of the left acetabulum and may be of orthopedic significance. There is also a new lucent lesion involving the L3 vertebra which extends into the left pedicle. 2. Perihilar nodule within the left lower lobe appears increased in size from previous exam. Other scattered small nodules are not significantly changed in the interval. 3. Similar appearance of left hilar adenopathy. 4. Small perinephric soft tissue nodule is identified adjacent to the right kidney. Increased in size from previous exam. Electronically Signed   By: Kerby Moors M.D.   On: 04/26/2018 13:50    Impression/Plan: 1. Recurrent metastatic Bilateral ER positivebreast cancer. Dr. Lisbeth Renshaw discusses the patient's  course and would offer her palliaitive radiotherapy to the left acetabulum. Dr. Lisbeth Renshaw also discussed that given the proximity to the spinal canal, he would also recommend treatment of her L3 lesion. We discussed the risks, benefits, short, and long term effects of radiotherapy, and the patient is interested in proceeding. Dr. Lisbeth Renshaw discusses the delivery and logistics of radiotherapy and anticipates a course of 2 weeks of radiotherapy to both sites. Written consent is obtained and placed in the chart, a copy was provided to the patient. She will simulate tomorrow and we anticipate her treatment to begin later this week. She will continue her antiestrogen and immunotherapy regimen.  2. Pain secondary to #1. A new prescription for Norco 5/325 was sent to the patient's pharmacy and side effect profile of this was reviewed. She will let us  know if her symptoms are managed as we could increase her dosing, however since she's opiate naive, I'd like to be more intentional about trying hydrocodone first.  In a visit lasting 45 minutes, greater than 50% of the time was spent face to face discussing her case, and coordinating the patient's care.  The above documentation reflects my direct findings during this shared patient visit. Please see the separate note by Dr. Lisbeth Renshaw on this date for the remainder of the patient's plan of care.     Carola Rhine, PAC

## 2018-05-06 ENCOUNTER — Encounter: Payer: Self-pay | Admitting: Pharmacy Technician

## 2018-05-06 ENCOUNTER — Ambulatory Visit
Admission: RE | Admit: 2018-05-06 | Discharge: 2018-05-06 | Disposition: A | Discharge status: Home / Self Care | Payer: Self-pay | Source: Ambulatory Visit | Attending: Radiation Oncology | Admitting: Radiation Oncology

## 2018-05-06 NOTE — Progress Notes (Signed)
The patient is approved for drug assistance by [employer] for Xgeva.  Enrollment is effective until 05/05/19 and is based on self Pay.  Drug replacement will begin on DOS 05/21/18.

## 2018-05-07 ENCOUNTER — Ambulatory Visit
Admission: RE | Admit: 2018-05-07 | Discharge: 2018-05-07 | Disposition: A | Discharge status: Home / Self Care | Payer: Self-pay | Source: Ambulatory Visit | Attending: Radiation Oncology | Admitting: Radiation Oncology

## 2018-05-10 ENCOUNTER — Ambulatory Visit
Admission: RE | Admit: 2018-05-10 | Discharge: 2018-05-10 | Disposition: A | Discharge status: Home / Self Care | Payer: Self-pay | Source: Ambulatory Visit | Attending: Radiation Oncology | Admitting: Radiation Oncology

## 2018-05-11 ENCOUNTER — Ambulatory Visit
Admission: RE | Admit: 2018-05-11 | Discharge: 2018-05-11 | Disposition: A | Discharge status: Home / Self Care | Payer: Self-pay | Source: Ambulatory Visit | Attending: Radiation Oncology | Admitting: Radiation Oncology

## 2018-05-11 ENCOUNTER — Encounter: Payer: Self-pay | Admitting: Pharmacy Technician

## 2018-05-11 NOTE — Progress Notes (Signed)
The patient is approved for drug assistance by [employer] for Faslodex. Enrollment is effective until 05/12/19 and is based on self pay.  Drug replacement begins on DOS 05/14/18.

## 2018-05-12 ENCOUNTER — Ambulatory Visit
Admission: RE | Admit: 2018-05-12 | Discharge: 2018-05-12 | Disposition: A | Discharge status: Home / Self Care | Payer: Self-pay | Source: Ambulatory Visit | Attending: Radiation Oncology | Admitting: Radiation Oncology

## 2018-05-13 ENCOUNTER — Ambulatory Visit
Admission: RE | Admit: 2018-05-13 | Discharge: 2018-05-13 | Disposition: A | Discharge status: Home / Self Care | Payer: Self-pay | Source: Ambulatory Visit | Attending: Radiation Oncology | Admitting: Radiation Oncology

## 2018-05-14 ENCOUNTER — Inpatient Hospital Stay: Payer: Self-pay

## 2018-05-14 ENCOUNTER — Telehealth: Payer: Self-pay | Admitting: Hematology and Oncology

## 2018-05-14 ENCOUNTER — Ambulatory Visit
Admission: RE | Admit: 2018-05-14 | Discharge: 2018-05-14 | Disposition: A | Discharge status: Home / Self Care | Payer: Self-pay | Source: Ambulatory Visit | Attending: Radiation Oncology | Admitting: Radiation Oncology

## 2018-05-14 VITALS — BP 149/71 | HR 64 | Temp 98.2°F | Resp 20

## 2018-05-14 DIAGNOSIS — C50012 Malignant neoplasm of nipple and areola, left female breast: Principal | ICD-10-CM

## 2018-05-14 DIAGNOSIS — C50011 Malignant neoplasm of nipple and areola, right female breast: Secondary | ICD-10-CM

## 2018-05-14 DIAGNOSIS — Z95828 Presence of other vascular implants and grafts: Secondary | ICD-10-CM

## 2018-05-14 DIAGNOSIS — Z170 Estrogen receptor positive status [ER+]: Principal | ICD-10-CM

## 2018-05-14 LAB — CMP (CANCER CENTER ONLY)
ALT: 26 U/L (ref 0–44)
AST: 22 U/L (ref 15–41)
Albumin: 3.6 g/dL (ref 3.5–5.0)
Alkaline Phosphatase: 65 U/L (ref 38–126)
Anion gap: 8 (ref 5–15)
BUN: 13 mg/dL (ref 6–20)
CO2: 23 mmol/L (ref 22–32)
Calcium: 8.7 mg/dL — ABNORMAL LOW (ref 8.9–10.3)
Chloride: 109 mmol/L (ref 98–111)
Creatinine: 0.62 mg/dL (ref 0.44–1.00)
GFR, Est AFR Am: >60 mL/min (ref >60)
GFR, Estimated: >60 mL/min (ref >60)
Glucose, Bld: 96 mg/dL (ref 70–99)
POTASSIUM: 3.9 mmol/L (ref 3.5–5.1)
Sodium: 140 mmol/L (ref 135–145)
Total Bilirubin: 0.6 mg/dL (ref 0.3–1.2)
Total Protein: 7 g/dL (ref 6.5–8.1)

## 2018-05-14 LAB — CBC WITH DIFFERENTIAL (CANCER CENTER ONLY)
Abs Immature Granulocytes: 0.02 10*3/uL (ref 0.00–0.07)
Basophils Absolute: 0 10*3/uL (ref 0.0–0.1)
Basophils Relative: 1 %
Eosinophils Absolute: 0 10*3/uL (ref 0.0–0.5)
Eosinophils Relative: 1 %
HCT: 34.9 % — ABNORMAL LOW (ref 36.0–46.0)
Hemoglobin: 12.1 g/dL (ref 12.0–15.0)
Immature Granulocytes: 1 %
Lymphocytes Relative: 37 %
Lymphs Abs: 0.9 10*3/uL (ref 0.7–4.0)
MCH: 34.4 pg — ABNORMAL HIGH (ref 26.0–34.0)
MCHC: 34.7 g/dL (ref 30.0–36.0)
MCV: 99.1 fL (ref 80.0–100.0)
Monocytes Absolute: 0.2 10*3/uL (ref 0.1–1.0)
Monocytes Relative: 8 %
NEUTROS ABS: 1.2 10*3/uL — AB (ref 1.7–7.7)
Neutrophils Relative %: 52 %
Platelet Count: 124 10*3/uL — ABNORMAL LOW (ref 150–400)
RBC: 3.52 MIL/uL — AB (ref 3.87–5.11)
RDW: 13.3 % (ref 11.5–15.5)
WBC Count: 2.3 10*3/uL — ABNORMAL LOW (ref 4.0–10.5)
nRBC: 0 % (ref 0.0–0.2)

## 2018-05-14 MED ORDER — SODIUM CHLORIDE 0.9% FLUSH
10.0000 mL | INTRAVENOUS | Status: DC | PRN
  Administered 2018-05-14: 10 mL via INTRAVENOUS
  Filled 2018-05-14: qty 10

## 2018-05-14 MED ORDER — FULVESTRANT 250 MG/5ML IM SOLN
500.0000 mg | Freq: Once | INTRAMUSCULAR | Status: AC
  Administered 2018-05-14: 500 mg via INTRAMUSCULAR

## 2018-05-14 MED ORDER — FULVESTRANT 250 MG/5ML IM SOLN
INTRAMUSCULAR | Status: AC
  Filled 2018-05-14: qty 10

## 2018-05-14 MED ORDER — HEPARIN SOD (PORK) LOCK FLUSH 100 UNIT/ML IV SOLN
500.0000 [IU] | Freq: Once | INTRAVENOUS | Status: AC | PRN
  Administered 2018-05-14: 500 [IU] via INTRAVENOUS
  Filled 2018-05-14: qty 5

## 2018-05-14 NOTE — Patient Instructions (Signed)

## 2018-05-14 NOTE — Telephone Encounter (Signed)
Added appts for flush per 1/17 sch message - left message for daughter

## 2018-05-17 ENCOUNTER — Ambulatory Visit
Admission: RE | Admit: 2018-05-17 | Discharge: 2018-05-17 | Disposition: A | Discharge status: Home / Self Care | Payer: Self-pay | Source: Ambulatory Visit | Attending: Radiation Oncology | Admitting: Radiation Oncology

## 2018-05-18 ENCOUNTER — Ambulatory Visit
Admission: RE | Admit: 2018-05-18 | Discharge: 2018-05-18 | Disposition: A | Discharge status: Home / Self Care | Payer: Self-pay | Source: Ambulatory Visit | Attending: Radiation Oncology | Admitting: Radiation Oncology

## 2018-05-19 ENCOUNTER — Ambulatory Visit
Admission: RE | Admit: 2018-05-19 | Discharge: 2018-05-19 | Disposition: A | Discharge status: Home / Self Care | Payer: Self-pay | Source: Ambulatory Visit | Attending: Radiation Oncology | Admitting: Radiation Oncology

## 2018-05-19 ENCOUNTER — Encounter: Payer: Self-pay | Admitting: Radiation Oncology

## 2018-05-20 ENCOUNTER — Encounter: Payer: Self-pay | Admitting: Hematology and Oncology

## 2018-05-20 NOTE — Progress Notes (Signed)
Patient brought in [facility] to apply for discount.  Faxed to Customer Service. Fax received ok per confirmation sheet.

## 2018-05-27 ENCOUNTER — Telehealth: Payer: Self-pay | Admitting: Hematology and Oncology

## 2018-05-27 NOTE — Telephone Encounter (Signed)
[facility] - per instruction from desk nurse moved 1/31 f/u to 2/28 w/next injection. Left message for patient via [facility] [phone]. Start time for 1/31 appointments remain the same. Added note to 1/31 appointments for patient to get updated schedule.

## 2018-05-28 ENCOUNTER — Inpatient Hospital Stay: Payer: Self-pay

## 2018-05-28 ENCOUNTER — Other Ambulatory Visit: Payer: Self-pay | Admitting: Nurse Practitioner

## 2018-05-28 ENCOUNTER — Ambulatory Visit: Payer: Self-pay | Admitting: Hematology and Oncology

## 2018-05-28 VITALS — BP 157/67 | HR 64 | Temp 97.8°F | Resp 18

## 2018-05-28 DIAGNOSIS — C50012 Malignant neoplasm of nipple and areola, left female breast: Principal | ICD-10-CM

## 2018-05-28 DIAGNOSIS — Z95828 Presence of other vascular implants and grafts: Secondary | ICD-10-CM

## 2018-05-28 DIAGNOSIS — Z170 Estrogen receptor positive status [ER+]: Principal | ICD-10-CM

## 2018-05-28 DIAGNOSIS — C50011 Malignant neoplasm of nipple and areola, right female breast: Secondary | ICD-10-CM

## 2018-05-28 LAB — CMP (CANCER CENTER ONLY)
ALT: 22 U/L (ref 0–44)
AST: 21 U/L (ref 15–41)
Albumin: 3.5 g/dL (ref 3.5–5.0)
Alkaline Phosphatase: 51 U/L (ref 38–126)
Anion gap: 8 (ref 5–15)
BUN: 12 mg/dL (ref 6–20)
CO2: 25 mmol/L (ref 22–32)
Calcium: 8.9 mg/dL (ref 8.9–10.3)
Chloride: 106 mmol/L (ref 98–111)
Creatinine: 0.64 mg/dL (ref 0.44–1.00)
GFR, Est AFR Am: >60 mL/min (ref >60)
GFR, Estimated: >60 mL/min (ref >60)
Glucose, Bld: 113 mg/dL — ABNORMAL HIGH (ref 70–99)
Potassium: 3.9 mmol/L (ref 3.5–5.1)
Sodium: 139 mmol/L (ref 135–145)
Total Bilirubin: 0.6 mg/dL (ref 0.3–1.2)
Total Protein: 6.6 g/dL (ref 6.5–8.1)

## 2018-05-28 LAB — CBC WITH DIFFERENTIAL (CANCER CENTER ONLY)
Abs Immature Granulocytes: 0.01 10*3/uL (ref 0.00–0.07)
Basophils Absolute: 0.1 10*3/uL (ref 0.0–0.1)
Basophils Relative: 2 %
Eosinophils Absolute: 0.1 10*3/uL (ref 0.0–0.5)
Eosinophils Relative: 2 %
HCT: 33.1 % — ABNORMAL LOW (ref 36.0–46.0)
Hemoglobin: 11.7 g/dL — ABNORMAL LOW (ref 12.0–15.0)
Immature Granulocytes: 0 %
Lymphocytes Relative: 28 %
Lymphs Abs: 0.7 10*3/uL (ref 0.7–4.0)
MCH: 34.7 pg — ABNORMAL HIGH (ref 26.0–34.0)
MCHC: 35.3 g/dL (ref 30.0–36.0)
MCV: 98.2 fL (ref 80.0–100.0)
MONO ABS: 0.2 10*3/uL (ref 0.1–1.0)
Monocytes Relative: 8 %
NEUTROS ABS: 1.5 10*3/uL — AB (ref 1.7–7.7)
Neutrophils Relative %: 60 %
Platelet Count: 197 10*3/uL (ref 150–400)
RBC: 3.37 MIL/uL — ABNORMAL LOW (ref 3.87–5.11)
RDW: 12.9 % (ref 11.5–15.5)
WBC Count: 2.5 10*3/uL — ABNORMAL LOW (ref 4.0–10.5)
nRBC: 0 % (ref 0.0–0.2)

## 2018-05-28 MED ORDER — DENOSUMAB 120 MG/1.7ML ~~LOC~~ SOLN
120.0000 mg | Freq: Once | SUBCUTANEOUS | Status: AC
  Administered 2018-05-28: 120 mg via SUBCUTANEOUS

## 2018-05-28 MED ORDER — FULVESTRANT 250 MG/5ML IM SOLN
INTRAMUSCULAR | Status: AC
  Filled 2018-05-28: qty 5

## 2018-05-28 MED ORDER — DENOSUMAB 120 MG/1.7ML ~~LOC~~ SOLN
SUBCUTANEOUS | Status: AC
  Filled 2018-05-28: qty 1.7

## 2018-05-28 MED ORDER — HEPARIN SOD (PORK) LOCK FLUSH 100 UNIT/ML IV SOLN
500.0000 [IU] | Freq: Once | INTRAVENOUS | Status: AC | PRN
  Administered 2018-05-28: 500 [IU] via INTRAVENOUS
  Filled 2018-05-28: qty 5

## 2018-05-28 MED ORDER — SODIUM CHLORIDE 0.9% FLUSH
10.0000 mL | INTRAVENOUS | Status: DC | PRN
  Administered 2018-05-28: 10 mL via INTRAVENOUS
  Filled 2018-05-28: qty 10

## 2018-05-28 MED ORDER — FULVESTRANT 250 MG/5ML IM SOLN
500.0000 mg | Freq: Once | INTRAMUSCULAR | Status: AC
  Administered 2018-05-28: 500 mg via INTRAMUSCULAR

## 2018-05-28 MED ORDER — FULVESTRANT 250 MG/5ML IM SOLN
INTRAMUSCULAR | Status: AC
  Filled 2018-05-28: qty 10

## 2018-05-28 NOTE — Patient Instructions (Signed)
Denosumab injection What is this medicine? DENOSUMAB (den oh sue mab) slows bone breakdown. Prolia is used to treat osteoporosis in women after menopause and in men, and in people who are taking corticosteroids for 6 months or more. Xgeva is used to treat a high calcium level due to cancer and to prevent bone fractures and other bone problems caused by multiple myeloma or cancer bone metastases. Xgeva is also used to treat giant cell tumor of the bone. This medicine may be used for other purposes; ask your health care provider or pharmacist if you have questions. COMMON BRAND NAME(S): Prolia, XGEVA What should I tell my health care provider before I take this medicine? They need to know if you have any of these conditions: -dental disease -having surgery or tooth extraction -infection -kidney disease -low levels of calcium or Vitamin D in the blood -malnutrition -on hemodialysis -skin conditions or sensitivity -thyroid or parathyroid disease -an unusual reaction to denosumab, other medicines, foods, dyes, or preservatives -pregnant or trying to get pregnant -breast-feeding How should I use this medicine? This medicine is for injection under the skin. It is given by a health care professional in a hospital or clinic setting. A special MedGuide will be given to you before each treatment. Be sure to read this information carefully each time. For Prolia, talk to your pediatrician regarding the use of this medicine in children. Special care may be needed. For Xgeva, talk to your pediatrician regarding the use of this medicine in children. While this drug may be prescribed for children as young as 13 years for selected conditions, precautions do apply. Overdosage: If you think you have taken too much of this medicine contact a poison control center or emergency room at once. NOTE: This medicine is only for you. Do not share this medicine with others. What if I miss a dose? It is important not to  miss your dose. Call your doctor or health care professional if you are unable to keep an appointment. What may interact with this medicine? Do not take this medicine with any of the following medications: -other medicines containing denosumab This medicine may also interact with the following medications: -medicines that lower your chance of fighting infection -steroid medicines like prednisone or cortisone This list may not describe all possible interactions. Give your health care provider a list of all the medicines, herbs, non-prescription drugs, or dietary supplements you use. Also tell them if you smoke, drink alcohol, or use illegal drugs. Some items may interact with your medicine. What should I watch for while using this medicine? Visit your doctor or health care professional for regular checks on your progress. Your doctor or health care professional may order blood tests and other tests to see how you are doing. Call your doctor or health care professional for advice if you get a fever, chills or sore throat, or other symptoms of a cold or flu. Do not treat yourself. This drug may decrease your body's ability to fight infection. Try to avoid being around people who are sick. You should make sure you get enough calcium and vitamin D while you are taking this medicine, unless your doctor tells you not to. Discuss the foods you eat and the vitamins you take with your health care professional. See your dentist regularly. Brush and floss your teeth as directed. Before you have any dental work done, tell your dentist you are receiving this medicine. Do not become pregnant while taking this medicine or for 5 months   after stopping it. Talk with your doctor or health care professional about your birth control options while taking this medicine. Women should inform their doctor if they wish to become pregnant or think they might be pregnant. There is a potential for serious side effects to an unborn  child. Talk to your health care professional or pharmacist for more information. What side effects may I notice from receiving this medicine? Side effects that you should report to your doctor or health care professional as soon as possible: -allergic reactions like skin rash, itching or hives, swelling of the face, lips, or tongue -bone pain -breathing problems -dizziness -jaw pain, especially after dental work -redness, blistering, peeling of the skin -signs and symptoms of infection like fever or chills; cough; sore throat; pain or trouble passing urine -signs of low calcium like fast heartbeat, muscle cramps or muscle pain; pain, tingling, numbness in the hands or feet; seizures -unusual bleeding or bruising -unusually weak or tired Side effects that usually do not require medical attention (report to your doctor or health care professional if they continue or are bothersome): -constipation -diarrhea -headache -joint pain -loss of appetite -muscle pain -runny nose -tiredness -upset stomach This list may not describe all possible side effects. Call your doctor for medical advice about side effects. You may report side effects to FDA at 1-800-FDA-1088. Where should I keep my medicine? This medicine is only given in a clinic, doctor's office, or other health care setting and will not be stored at home. NOTE: This sheet is a summary. It may not cover all possible information. If you have questions about this medicine, talk to your doctor, pharmacist, or health care provider.  2019 Elsevier/Gold Standard (2017-08-21 16:10:44) Fulvestrant injection What is this medicine? FULVESTRANT (ful VES trant) blocks the effects of estrogen. It is used to treat breast cancer. This medicine may be used for other purposes; ask your health care provider or pharmacist if you have questions. COMMON BRAND NAME(S): FASLODEX What should I tell my health care provider before I take this medicine? They  need to know if you have any of these conditions: -bleeding disorders -liver disease -low blood counts, like low white cell, platelet, or red cell counts -an unusual or allergic reaction to fulvestrant, other medicines, foods, dyes, or preservatives -pregnant or trying to get pregnant -breast-feeding How should I use this medicine? This medicine is for injection into a muscle. It is usually given by a health care professional in a hospital or clinic setting. Talk to your pediatrician regarding the use of this medicine in children. Special care may be needed. Overdosage: If you think you have taken too much of this medicine contact a poison control center or emergency room at once. NOTE: This medicine is only for you. Do not share this medicine with others. What if I miss a dose? It is important not to miss your dose. Call your doctor or health care professional if you are unable to keep an appointment. What may interact with this medicine? -medicines that treat or prevent blood clots like warfarin, enoxaparin, dalteparin, apixaban, dabigatran, and rivaroxaban This list may not describe all possible interactions. Give your health care provider a list of all the medicines, herbs, non-prescription drugs, or dietary supplements you use. Also tell them if you smoke, drink alcohol, or use illegal drugs. Some items may interact with your medicine. What should I watch for while using this medicine? Your condition will be monitored carefully while you are receiving this medicine. You  will need important blood work done while you are taking this medicine. Do not become pregnant while taking this medicine or for at least 1 year after stopping it. Women of child-bearing potential will need to have a negative pregnancy test before starting this medicine. Women should inform their doctor if they wish to become pregnant or think they might be pregnant. There is a potential for serious side effects to an unborn  child. Men should inform their doctors if they wish to father a child. This medicine may lower sperm counts. Talk to your health care professional or pharmacist for more information. Do not breast-feed an infant while taking this medicine or for 1 year after the last dose. What side effects may I notice from receiving this medicine? Side effects that you should report to your doctor or health care professional as soon as possible: -allergic reactions like skin rash, itching or hives, swelling of the face, lips, or tongue -feeling faint or lightheaded, falls -pain, tingling, numbness, or weakness in the legs -signs and symptoms of infection like fever or chills; cough; flu-like symptoms; sore throat -vaginal bleeding Side effects that usually do not require medical attention (report to your doctor or health care professional if they continue or are bothersome): -aches, pains -constipation -diarrhea -headache -hot flashes -nausea, vomiting -pain at site where injected -stomach pain This list may not describe all possible side effects. Call your doctor for medical advice about side effects. You may report side effects to FDA at 1-800-FDA-1088. Where should I keep my medicine? This drug is given in a hospital or clinic and will not be stored at home. NOTE: This sheet is a summary. It may not cover all possible information. If you have questions about this medicine, talk to your doctor, pharmacist, or health care provider.  2019 Elsevier/Gold Standard (2017-07-23 11:34:41)

## 2018-06-03 NOTE — Progress Notes (Signed)
Patient Care Team: [name], MD as PCP - General (Family Medicine) Nicholas Lose, MD as Consulting Physician (Hematology and Oncology) Delice Bison, Charlestine Massed, NP as Nurse Practitioner (Hematology and Oncology) Kyung Rudd, MD as Consulting Physician (Radiation Oncology) Rolm Bookbinder, MD as Consulting Physician (General Surgery)  DIAGNOSIS:    ICD-10-CM   1. Malignant neoplasm metastatic to lung, unspecified laterality ([place]) C78.00   2. Bone metastasis ([place]) C79.51   3. 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, estrogen receptor positive ([place]) C50.011    Z17.0    C50.012     SUMMARY OF ONCOLOGIC HISTORY:   Bilateral breast cancer ([place])   08/16/2015 Mammogram    Left breast LIQ 4.2 cm, 4 mm oval mass 12:00, 1.2 cm mass left UOQ, enlarged left axillary lymph node    08/16/2015 Mammogram    Right breast UIQ: 3.9 cm irregular mass, 1.5 cm mass in the right LOQ, 9 mm mass right UOQ, 6 mm mass right LIQ, enlarged lymph node Rt axilla    08/20/2015 Initial Diagnosis    Right breast biopsy 1:00: IDC grade 2 ER 95%, PR 2%, Ki 67 15%; HER-2 negative ratio 1.04; 10:00: IDC grade 2-3 dissimilar to 1:00 biopsy 10:00: ER 95%, PR 0%, K67: 25%, HER-2 positive ratio 6.42 right axillary lymph node positive for metastatic cancer    08/20/2015 Procedure    Left biopsy 8:00: IDC with DCIS, grade 2; ER 95%, PR 95%, Ki-67 30%, HER-2 negative ratio 1.2; 12:00: IDC with DCIS, grade 1 ER 95%, PR 95%, Ki-67 10% HER-2 negative ratio 1.14 not similar to this biopsy, left axillary lymph node positive,     09/13/2015 - 01/17/2016 Neo-Adjuvant Chemotherapy    Neoadjuvant dose dense Adriamycin and Cytoxan followed by Taxol Herceptin Perjeta, followed by Herceptin maintenance    01/21/2016 Breast MRI    Bilateral breast masses significantly decreased in size with mild residual NME, complete resolution of numerous other nodules in both breasts, bilateral axillary lymph nodes and bilateral  retropectoral lymph nodes decreased in size    02/11/2016 Surgery    Right mastectomy: IDC 2 nodules, 2.5, 1 cm, margins negative, 3/7 lymph nodes positive T2 N1a stage II B, ER 95%, PR 2%, HER-2 negative, Ki-67 15% RCB burden 3.1 and 2.9 class II    02/11/2016 Surgery    Left mastectomy: IDC 3.1 cm with DCIS, margins negative, 0/8 lymph nodes, T2 N0 stage II a, RCB burden 2.18, class II, ER 100%, PR 95%, HER-2 negative ratio 1.2, Ki-67 30%     04/07/2016 - 05/15/2016 Radiation Therapy    Adjuvant radiation    06/26/2016 - 07/13/2017 Anti-estrogen oral therapy    Letrozole 2.5 mg daily    11/20/2016 Miscellaneous    Neratinib 240 mg daily discontinued 07/13/2017 when she presented with metastatic disease to the lungs    07/13/2017 Relapse/Recurrence    Progression of right and left hilar lymph nodes and bilateral lung nodules    07/13/2017 -  Anti-estrogen oral therapy    Ibrance with letrozole    10/24/2017 Imaging    CT chest abdomen pelvis: Interval decrease in the left hilar node.  The more superior left hilar node is slightly increased in size.  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  Lesion in the left acetabulum measures 5.5 cm, new lesion L3 extending to the left pedicle.  Perihilar nodule left lower lobe  increased in size 2.8 cm from 1.8 cm, other nodules not changed.  Similar appearance of left hilar adenopathy, small perinephric soft tissue nodule increased in size     CHIEF COMPLIANT: Follow-up of Ibrance with letrozole and Xgeva and palliative radiation   INTERVAL HISTORY: Felicia [name] is a 58 y.o. with above-mentioned history of metastatic breast cancer currently on Ibrance with letrozole. She finished palliative radiation on 05/19/18. She presents to the clinic today with two family members. An interpreter was used for the visit. She reports radiation went well  but reports a productive cough at night that leads to pain in her lower back that is a 8 out of 10, where she feels like her bone will break. She reports pain in her left knee that worsens when she walks and for which she has not taken any medication because it leads to kidney pain. She had a fever and headache last night and asked what medication she could take. Her most recent labs on 05/28/18 were WNL. She currently takes 2 tablets of a potassium supplement daily. She reviewed her medication list with me.   REVIEW OF SYSTEMS:   Constitutional: Denies chills or abnormal weight loss (+) fever (+) headache Eyes: Denies blurriness of vision Ears, nose, mouth, throat, and face: Denies mucositis or sore throat Respiratory: Denies dyspnea or wheezes (+) cough Cardiovascular: Denies palpitation, chest discomfort Gastrointestinal: Denies nausea or change in bowel habits (+) heartburn  Skin: Denies abnormal skin rashes MSK: (+) lower back pain (+) left knee pain Lymphatics: Denies new lymphadenopathy or easy bruising Neurological: Denies numbness, tingling or new weaknesses Behavioral/Psych: Mood is stable, no new changes  Extremities: No lower extremity edema Breast: denies any pain or lumps or nodules in either breasts All other systems were reviewed with the patient and are negative.  I have reviewed the past medical history, past surgical history, social history and family history with the patient and they are unchanged from previous note.  ALLERGIES:  is allergic to no known allergies.  MEDICATIONS:  Current Outpatient Medications  Medication Sig Dispense Refill  . Calcium Carb-Cholecalciferol (CALCIUM 600+D) 600-800 MG-UNIT TABS Take 1 tablet by mouth daily.    . Cholecalciferol (VITAMIN D-3) 5000 units TABS Take 1 tablet by mouth daily.    Marland Kitchen letrozole (FEMARA) 2.5 MG tablet TAKE 1 TABLET BY MOUTH DAILY. 90 tablet 0  . lidocaine-prilocaine (EMLA) cream Apply 1 application topically as  needed (Apply 2- hours prior to access of port). 30 g 6  . lisinopril (PRINIVIL,ZESTRIL) 20 MG tablet Take 1 tablet (20 mg total) by mouth daily. 30 tablet 6  . palbociclib (IBRANCE) 125 MG capsule Take 1 capsule (125 mg total) by mouth daily with breakfast. Take for 21 days on, 7 days off, repeated every 28 days. Take whole with food. 21 capsule 6  . pantoprazole (PROTONIX) 40 MG tablet Take 1 tablet (40 mg total) by mouth daily. 30 tablet 0  . potassium chloride (K-DUR,KLOR-CON) 10 MEQ tablet TAKE 2 TABLETS (20 MEQ TOTAL) BY MOUTH 2 TIMES DAILY. 120 tablet 2   No current facility-administered medications for this visit.    Facility-Administered Medications Ordered in Other Visits  Medication Dose Route Frequency Provider Last Rate Last Dose  . sodium chloride flush (NS) 0.9 % injection 10 mL  10 mL Intravenous PRN Nicholas Lose, MD   10 mL at 04/14/17 0831    PHYSICAL EXAMINATION: ECOG PERFORMANCE STATUS: 1 - Symptomatic but completely ambulatory  Vitals:  06/04/18 0855  BP: (!) 169/76  Pulse: 96  Resp: 17  Temp: 98 F (36.7 C)  SpO2: 98%   Filed Weights   06/04/18 0855  Weight: 224 lb 3.2 oz (101.7 kg)    GENERAL: alert, no distress and comfortable SKIN: skin color, texture, turgor are normal, no rashes or significant lesions EYES: normal, Conjunctiva are pink and non-injected, sclera clear OROPHARYNX: no exudate, no erythema and lips, buccal mucosa, and tongue normal  NECK: supple, thyroid normal size, non-tender, without nodularity LYMPH: no palpable lymphadenopathy in the cervical, axillary or inguinal LUNGS: clear to auscultation and percussion with normal breathing effort HEART: regular rate & rhythm and no murmurs and no lower extremity edema ABDOMEN: abdomen soft, non-tender and normal bowel sounds MUSCULOSKELETAL: no cyanosis of digits and no clubbing  NEURO: alert & oriented x 3 with fluent speech, no focal motor/sensory deficits EXTREMITIES: No lower extremity  edema  LABORATORY DATA:  I have reviewed the data as listed CMP Latest Ref Rng & Units 05/28/2018 05/14/2018 05/04/2018  Glucose 70 - 99 mg/dL 113(H) 96 94  BUN 6 - 20 mg/dL '12 13 13  '$ Creatinine 0.44 - 1.00 mg/dL 0.64 0.62 0.66  Sodium 135 - 145 mmol/L 139 140 140  Potassium 3.5 - 5.1 mmol/L 3.9 3.9 3.8  Chloride 98 - 111 mmol/L 106 109 107  CO2 22 - 32 mmol/L '25 23 24  '$ Calcium 8.9 - 10.3 mg/dL 8.9 8.7(L) 9.4  Total Protein 6.5 - 8.1 g/dL 6.6 7.0 7.3  Total Bilirubin 0.3 - 1.2 mg/dL 0.6 0.6 0.4  Alkaline Phos 38 - 126 U/L 51 65 74  AST 15 - 41 U/L '21 22 28  '$ ALT 0 - 44 U/L '22 26 31    '$ Lab Results  Component Value Date   WBC 2.5 (L) 05/28/2018   HGB 11.7 (L) 05/28/2018   HCT 33.1 (L) 05/28/2018   MCV 98.2 05/28/2018   PLT 197 05/28/2018   NEUTROABS 1.5 (L) 05/28/2018    ASSESSMENT & PLAN:  Bilateral breast cancer ([place]) Treatment Summary: 1. Neoadjuvant chemotherapy with either AC followed by THP started 09/13/2015 and completed 12 weeks of Taxol 11/29/2015, Herceptin Perjeta 6; now on Herceptin  2. Bilateral mastectomies and lymph node surgery 02/11/16 3. Adjuvant radiation 04/07/2016 to 05/15/2016 4. Adjuvant antiestrogen therapy with Letrozole started 06/26/2016 5. Neratinib after Herceptin maintenancediscontinued 07/13/2017 when she developed metastatic disease 6.  Ibrance with letrozole started March 2019 stopped 04/29/2018 due to progression 7.  Palliative radiation L2-4 and left pelvis completed 05/19/2018 ------------------------------------------------------------------------------------------------------------------------------------------------------------------------------- CT CAP: 04/26/2018 interval development of multiple lytic bone metastases.  Lesion in the left acetabulum measures 5.5 cm, new lesion L3 extending to the left pedicle.  Perihilar nodule left lower lobe increased in size 2.8 cm from 1.8 cm, other nodules not changed.  Similar appearance of left hilar  adenopathy, small perinephric soft tissue nodule increased in size  Current treatment: Faslodex Ibrance along with letrozole, Delton See will now be changed to every 3 months  Back pain: Whenever she coughs the pain is 8 out of 10. Cough: I suspect this is acid reflux and I prescribed her Protonix.  She will take it for 2 weeks. Intermittent headaches  Return to clinic monthly for Faslodex injections and every 3 months for Xgeva. Plan to obtain scans in 3 months    No orders of the defined types were placed in this encounter.  The patient has a good understanding of the overall plan. she agrees with it. she will  call with any problems that may develop before the next visit here.  Nicholas Lose, MD 06/04/2018  Julious Oka Dorshimer am acting as scribe for Dr. Nicholas Lose.  I have reviewed the above documentation for accuracy and completeness, and I agree with the above.

## 2018-06-04 ENCOUNTER — Inpatient Hospital Stay: Payer: Self-pay | Attending: Hematology and Oncology | Admitting: Hematology and Oncology

## 2018-06-04 VITALS — BP 169/76 | HR 96 | Temp 98.0°F | Resp 17 | Ht 60.0 in | Wt 224.2 lb

## 2018-06-04 DIAGNOSIS — C7800 Secondary malignant neoplasm of unspecified lung: Secondary | ICD-10-CM

## 2018-06-04 DIAGNOSIS — Z923 Personal history of irradiation: Secondary | ICD-10-CM | POA: Insufficient documentation

## 2018-06-04 DIAGNOSIS — C50012 Malignant neoplasm of nipple and areola, left female breast: Secondary | ICD-10-CM | POA: Insufficient documentation

## 2018-06-04 DIAGNOSIS — C50011 Malignant neoplasm of nipple and areola, right female breast: Secondary | ICD-10-CM

## 2018-06-04 DIAGNOSIS — Z79811 Long term (current) use of aromatase inhibitors: Secondary | ICD-10-CM | POA: Insufficient documentation

## 2018-06-04 DIAGNOSIS — Z9221 Personal history of antineoplastic chemotherapy: Secondary | ICD-10-CM | POA: Insufficient documentation

## 2018-06-04 DIAGNOSIS — Z9013 Acquired absence of bilateral breasts and nipples: Secondary | ICD-10-CM | POA: Insufficient documentation

## 2018-06-04 DIAGNOSIS — C7951 Secondary malignant neoplasm of bone: Secondary | ICD-10-CM

## 2018-06-04 DIAGNOSIS — C773 Secondary and unspecified malignant neoplasm of axilla and upper limb lymph nodes: Secondary | ICD-10-CM | POA: Insufficient documentation

## 2018-06-04 DIAGNOSIS — Z170 Estrogen receptor positive status [ER+]: Secondary | ICD-10-CM | POA: Insufficient documentation

## 2018-06-04 MED ORDER — PANTOPRAZOLE SODIUM 40 MG PO TBEC: 40.0000 mg | DELAYED_RELEASE_TABLET | Freq: Every day | ORAL | 0 refills | Status: DC

## 2018-06-04 NOTE — Assessment & Plan Note (Signed)
Treatment Summary: 1. Neoadjuvant chemotherapy with either AC followed by THP started 09/13/2015 and completed 12 weeks of Taxol 11/29/2015, Herceptin Perjeta 6; now on Herceptin  2. Bilateral mastectomies and lymph node surgery 02/11/16 3. Adjuvant radiation 04/07/2016 to 05/15/2016 4. Adjuvant antiestrogen therapy with Letrozole started 06/26/2016 5. Neratinib after Herceptin maintenancediscontinued 07/13/2017 when she developed metastatic disease 6.  Ibrance with letrozole started March 2019 stopped 04/29/2018 due to progression 7.  Palliative radiation L2-4 and left pelvis completed 05/19/2018  CT CAP: 04/26/2018 interval development of multiple lytic bone metastases.  Lesion in the left acetabulum measures 5.5 cm, new lesion L3 extending to the left pedicle.  Perihilar nodule left lower lobe increased in size 2.8 cm from 1.8 cm, other nodules not changed.  Similar appearance of left hilar adenopathy, small perinephric soft tissue nodule increased in size  Current treatment: Faslodex Ibrance along with letrozole, Xgeva Return to clinic monthly for Faslodex injections and every 3 months for Xgeva. Plan to obtain scans in 3 months

## 2018-06-07 ENCOUNTER — Encounter (HOSPITAL_COMMUNITY): Payer: Self-pay | Admitting: Hematology and Oncology

## 2018-06-08 ENCOUNTER — Telehealth: Payer: Self-pay | Admitting: Hematology and Oncology

## 2018-06-08 NOTE — Telephone Encounter (Signed)
[facility] CME 2/28 - moved f/u to [facility]. Start time remains the same.

## 2018-06-09 ENCOUNTER — Other Ambulatory Visit: Payer: Self-pay

## 2018-06-12 IMAGING — CR DG CHEST 1V
1 series · 1 of 1 positions shown · non-contrast
Comparison: 09/04/2015

CLINICAL DATA: Port-A-Cath placement

EXAM:
CHEST 1 VIEW

[w chest pa]
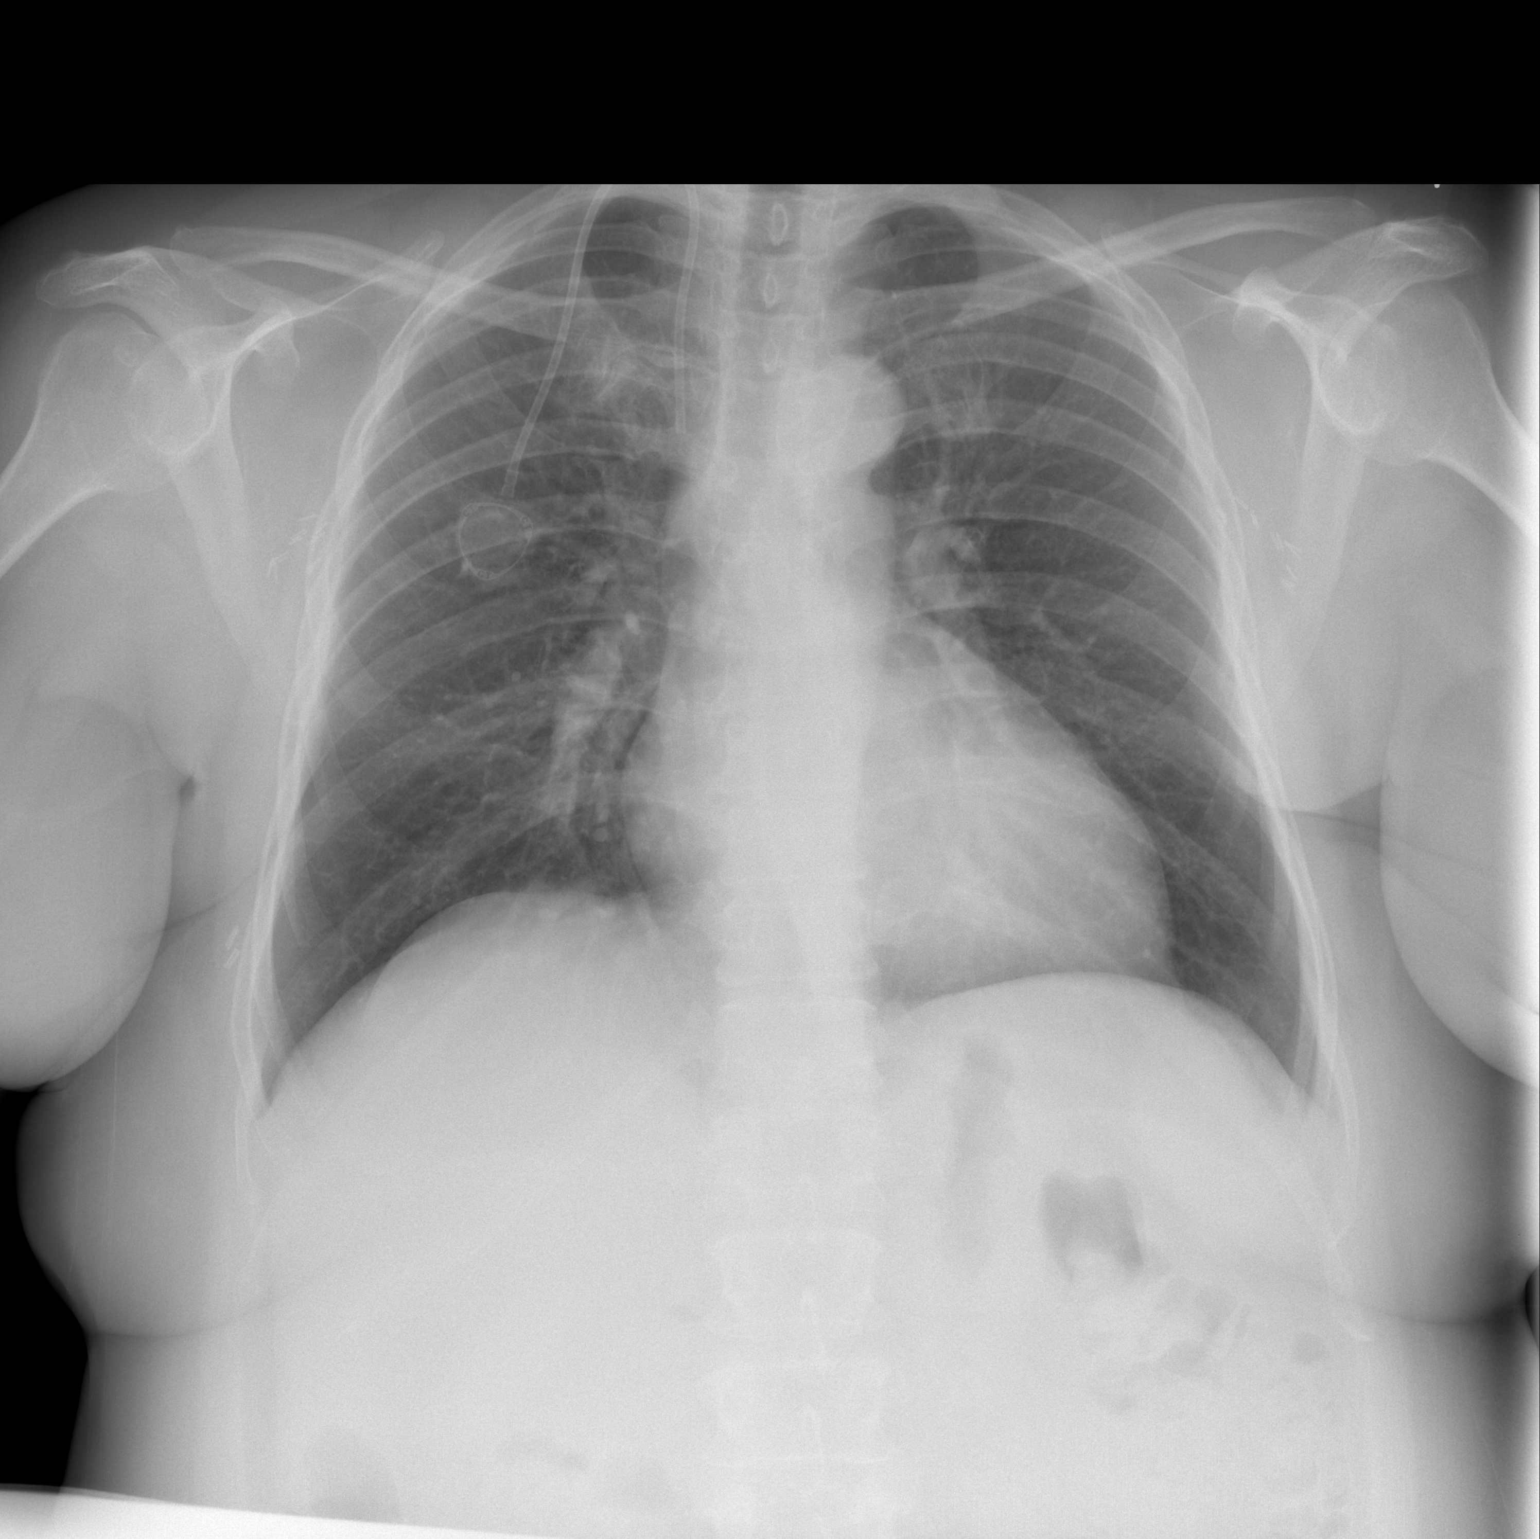

[1 of 1 positions shown; findings below may reference images not displayed]

FINDINGS: Right Port-A-Cath is in place with the tip likely in the central
right innominate vein near the confluence of the innominate veins.
Similar location to prior study. No pneumothorax. Heart is normal
size. Lungs are clear. No effusions or acute bony abnormality.
IMPRESSION: Right Port-A-Cath tip likely near the confluence of the innominate
veins. No pneumothorax.

## 2018-06-17 NOTE — Progress Notes (Signed)
  Radiation Oncology         [phone]) [phone] ________________________________  Name: Felicia [name] MRN: [id number]  Date: 05/19/2018  DOB: Nov 11, 1960  End of Treatment Note  Diagnosis:   58 y.o. female with Recurrent metastatic Bilateral ER positive breast cancer with bone metastasis    Indication for treatment:  Palliative       Radiation treatment dates:   05/06/2018 - 05/19/2018  Site/dose:   The lumbar spine (L2-4) and left pelvis were treated to 30 Gy in 10 fractions of 3 Gy.  Beams/energy:   Isodose Plan / 15X Photon  Narrative: The patient tolerated radiation treatment relatively well.   She endorses a little pain in her left leg, hip, and knee when she lifts it. Otherwise, no complaints.  Plan: The patient has completed radiation treatment. The patient will return to radiation oncology clinic for routine followup in one month. I advised them to call or return sooner if they have any questions or concerns related to their recovery or treatment.  ------------------------------------------------  Jodelle Gross, MD, PhD  This document serves as a record of services personally performed by Kyung Rudd, MD. It was created on his behalf by Rae Lips, a trained medical scribe. The creation of this record is based on the scribe's personal observations and the provider's statements to them. This document has been checked and approved by the attending provider.

## 2018-06-25 ENCOUNTER — Telehealth: Payer: Self-pay | Admitting: Hematology and Oncology

## 2018-06-25 ENCOUNTER — Inpatient Hospital Stay: Payer: Self-pay

## 2018-06-25 ENCOUNTER — Inpatient Hospital Stay (HOSPITAL_BASED_OUTPATIENT_CLINIC_OR_DEPARTMENT_OTHER): Payer: Self-pay | Admitting: Adult Health

## 2018-06-25 ENCOUNTER — Encounter: Payer: Self-pay | Admitting: Adult Health

## 2018-06-25 VITALS — BP 169/80 | HR 61 | Temp 97.5°F | Resp 18 | Ht 60.0 in | Wt 219.9 lb

## 2018-06-25 DIAGNOSIS — Z9221 Personal history of antineoplastic chemotherapy: Secondary | ICD-10-CM

## 2018-06-25 DIAGNOSIS — Z923 Personal history of irradiation: Secondary | ICD-10-CM

## 2018-06-25 DIAGNOSIS — C50012 Malignant neoplasm of nipple and areola, left female breast: Secondary | ICD-10-CM

## 2018-06-25 DIAGNOSIS — Z170 Estrogen receptor positive status [ER+]: Principal | ICD-10-CM

## 2018-06-25 DIAGNOSIS — C50011 Malignant neoplasm of nipple and areola, right female breast: Secondary | ICD-10-CM

## 2018-06-25 DIAGNOSIS — C773 Secondary and unspecified malignant neoplasm of axilla and upper limb lymph nodes: Secondary | ICD-10-CM

## 2018-06-25 DIAGNOSIS — Z79811 Long term (current) use of aromatase inhibitors: Secondary | ICD-10-CM

## 2018-06-25 DIAGNOSIS — Z95828 Presence of other vascular implants and grafts: Secondary | ICD-10-CM

## 2018-06-25 DIAGNOSIS — C7951 Secondary malignant neoplasm of bone: Secondary | ICD-10-CM

## 2018-06-25 DIAGNOSIS — Z9013 Acquired absence of bilateral breasts and nipples: Secondary | ICD-10-CM

## 2018-06-25 DIAGNOSIS — C7800 Secondary malignant neoplasm of unspecified lung: Secondary | ICD-10-CM

## 2018-06-25 LAB — CBC WITH DIFFERENTIAL (CANCER CENTER ONLY)
Abs Immature Granulocytes: 0.04 10*3/uL (ref 0.00–0.07)
Basophils Absolute: 0.1 10*3/uL (ref 0.0–0.1)
Basophils Relative: 2 %
Eosinophils Absolute: 0 10*3/uL (ref 0.0–0.5)
Eosinophils Relative: 1 %
HCT: 34 % — ABNORMAL LOW (ref 36.0–46.0)
Hemoglobin: 11.5 g/dL — ABNORMAL LOW (ref 12.0–15.0)
Immature Granulocytes: 2 %
Lymphocytes Relative: 35 %
Lymphs Abs: 0.9 10*3/uL (ref 0.7–4.0)
MCH: 34 pg (ref 26.0–34.0)
MCHC: 33.8 g/dL (ref 30.0–36.0)
MCV: 100.6 fL — ABNORMAL HIGH (ref 80.0–100.0)
Monocytes Absolute: 0.1 10*3/uL (ref 0.1–1.0)
Monocytes Relative: 5 %
Neutro Abs: 1.5 10*3/uL — ABNORMAL LOW (ref 1.7–7.7)
Neutrophils Relative %: 55 %
Platelet Count: 192 10*3/uL (ref 150–400)
RBC: 3.38 MIL/uL — ABNORMAL LOW (ref 3.87–5.11)
RDW: 13.1 % (ref 11.5–15.5)
WBC Count: 2.7 10*3/uL — ABNORMAL LOW (ref 4.0–10.5)
nRBC: 0 % (ref 0.0–0.2)

## 2018-06-25 LAB — CMP (CANCER CENTER ONLY)
ALT: 31 U/L (ref 0–44)
AST: 27 U/L (ref 15–41)
Albumin: 3.8 g/dL (ref 3.5–5.0)
Alkaline Phosphatase: 47 U/L (ref 38–126)
Anion gap: 8 (ref 5–15)
BUN: 11 mg/dL (ref 6–20)
CO2: 24 mmol/L (ref 22–32)
Calcium: 8.5 mg/dL — ABNORMAL LOW (ref 8.9–10.3)
Chloride: 108 mmol/L (ref 98–111)
Creatinine: 0.66 mg/dL (ref 0.44–1.00)
GFR, Est AFR Am: >60 mL/min (ref >60)
GFR, Estimated: >60 mL/min (ref >60)
Glucose, Bld: 114 mg/dL — ABNORMAL HIGH (ref 70–99)
Potassium: 3.8 mmol/L (ref 3.5–5.1)
Sodium: 140 mmol/L (ref 135–145)
Total Bilirubin: 0.7 mg/dL (ref 0.3–1.2)
Total Protein: 7 g/dL (ref 6.5–8.1)

## 2018-06-25 MED ORDER — FULVESTRANT 250 MG/5ML IM SOLN
500.0000 mg | Freq: Once | INTRAMUSCULAR | Status: AC
  Administered 2018-06-25: 500 mg via INTRAMUSCULAR

## 2018-06-25 MED ORDER — FULVESTRANT 250 MG/5ML IM SOLN
INTRAMUSCULAR | Status: AC
  Filled 2018-06-25: qty 10

## 2018-06-25 MED ORDER — HEPARIN SOD (PORK) LOCK FLUSH 100 UNIT/ML IV SOLN
500.0000 [IU] | Freq: Once | INTRAVENOUS | Status: AC | PRN
  Administered 2018-06-25: 500 [IU] via INTRAVENOUS
  Filled 2018-06-25: qty 5

## 2018-06-25 MED ORDER — SODIUM CHLORIDE 0.9% FLUSH
10.0000 mL | INTRAVENOUS | Status: DC | PRN
  Administered 2018-06-25: 10 mL via INTRAVENOUS
  Filled 2018-06-25: qty 10

## 2018-06-25 MED ORDER — DENOSUMAB 120 MG/1.7ML ~~LOC~~ SOLN
SUBCUTANEOUS | Status: AC
  Filled 2018-06-25: qty 1.7

## 2018-06-25 NOTE — Progress Notes (Signed)
Hold xgeva per Wilber Bihari NP. Cal 8.5. Advised pt to up calcium supplement. Jens Siems LPN

## 2018-06-25 NOTE — Progress Notes (Signed)
[facility] Follow up:    Felicia Rakes, MD [place] [address]   DIAGNOSIS: Cancer Staging Bilateral breast cancer [facility]) Staging form: Breast, AJCC 7th Edition - Pathologic: Stage IIB (yT2, N1a, cM0) - Unsigned - Pathologic: Stage IIA (yT2, N0, cM0) - Unsigned   SUMMARY OF ONCOLOGIC HISTORY:   Bilateral breast cancer ([place])   08/16/2015 Mammogram    Left breast LIQ 4.2 cm, 4 mm oval mass 12:00, 1.2 cm mass left UOQ, enlarged left axillary lymph node    08/16/2015 Mammogram    Right breast UIQ: 3.9 cm irregular mass, 1.5 cm mass in the right LOQ, 9 mm mass right UOQ, 6 mm mass right LIQ, enlarged lymph node Rt axilla    08/20/2015 Initial Diagnosis    Right breast biopsy 1:00: IDC grade 2 ER 95%, PR 2%, Ki 67 15%; HER-2 negative ratio 1.04; 10:00: IDC grade 2-3 dissimilar to 1:00 biopsy 10:00: ER 95%, PR 0%, K67: 25%, HER-2 positive ratio 6.42 right axillary lymph node positive for metastatic cancer    08/20/2015 Procedure    Left biopsy 8:00: IDC with DCIS, grade 2; ER 95%, PR 95%, Ki-67 30%, HER-2 negative ratio 1.2; 12:00: IDC with DCIS, grade 1 ER 95%, PR 95%, Ki-67 10% HER-2 negative ratio 1.14 not similar to this biopsy, left axillary lymph node positive,     09/13/2015 - 01/17/2016 Neo-Adjuvant Chemotherapy    Neoadjuvant dose dense Adriamycin and Cytoxan followed by Taxol Herceptin Perjeta, followed by Herceptin maintenance    01/21/2016 Breast MRI    Bilateral breast masses significantly decreased in size with mild residual NME, complete resolution of numerous other nodules in both breasts, bilateral axillary lymph nodes and bilateral retropectoral lymph nodes decreased in size    02/11/2016 Surgery    Right mastectomy: IDC 2 nodules, 2.5, 1 cm, margins negative, 3/7 lymph nodes positive T2 N1a stage II B, ER 95%, PR 2%, HER-2 negative, Ki-67 15% RCB burden 3.1 and 2.9 class II    02/11/2016 Surgery    Left mastectomy: IDC 3.1 cm  with DCIS, margins negative, 0/8 lymph nodes, T2 N0 stage II a, RCB burden 2.18, class II, ER 100%, PR 95%, HER-2 negative ratio 1.2, Ki-67 30%     04/07/2016 - 05/15/2016 Radiation Therapy    Adjuvant radiation    06/26/2016 - 07/13/2017 Anti-estrogen oral therapy    Letrozole 2.5 mg daily    11/20/2016 Miscellaneous    Neratinib 240 mg daily discontinued 07/13/2017 when she presented with metastatic disease to the lungs    07/13/2017 Relapse/Recurrence    Progression of right and left hilar lymph nodes and bilateral lung nodules    07/13/2017 -  Anti-estrogen oral therapy    Ibrance with letrozole    10/24/2017 Imaging    CT chest abdomen pelvis: Interval decrease in the left hilar node.  The more superior left hilar node is slightly increased in size.  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  Lesion in the left acetabulum measures 5.5 cm, new lesion L3 extending to the left pedicle.  Perihilar nodule left lower lobe increased in size 2.8 cm from 1.8 cm, other nodules not changed.  Similar appearance of left hilar adenopathy, small perinephric soft tissue nodule increased in size     CURRENT THERAPY: Faslodex, [place], Letrozole  INTERVAL HISTORY: Felicia [name] 58 y.o. female  returns for evaluation of her metastatic breast cancer.  She is accompanied by a family member and Spanish interpreter [employer].  Felicia [name] started Faslodex a few weeks ago due to disease progression.  She notes she had some pain in the injection sites that radiated down into her legs.    She is taking Ibrance daily 3 weeks on and 1 week off.  Her off week will start next week.  Seh denies fatigue, diarrhea, nausea.  She is also taking Letrozole daily.  She has occasional pain in her hands bilaterally, more so in her fingers.  Otherwise she denies arthralgias, hot flashes, vaginal dryness.  She  is receiving Xgeva once a month.  She is tolerating that well also.  She denies any dental issues or concerns today.       Patient Active Problem List   Diagnosis Date Noted  . Bone metastasis ([place]) 05/05/2018  . Lung metastases ([place]) 07/27/2017  . GERD with esophagitis 03/16/2017  . Hypokalemia 09/03/2016  . Sinusitis 08/08/2016  . Hypertension 07/24/2016  . Port catheter in place 05/16/2016  . 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 Hypersensitivity reaction 11/05/2015  . Bilateral malignant neoplasm of overlapping sites of breast in female [facility]) 08/30/2015  . Bilateral breast cancer ([place]) 08/23/2015    is allergic to no known allergies.  MEDICAL HISTORY: Past Medical History:  Diagnosis Date  . Cancer ([place])    breast  . GERD (gastroesophageal reflux disease)     SURGICAL HISTORY: Past Surgical History:  Procedure Laterality Date  . MASTECTOMY MODIFIED RADICAL Bilateral 02/11/2016  . MASTECTOMY MODIFIED RADICAL Bilateral 02/11/2016   Procedure: BILATERAL MASTECTOMY MODIFIED RADICAL;  Surgeon: Rolm Bookbinder, MD;  Location: [place];  Service: General;  Laterality: Bilateral;  . PORTACATH PLACEMENT N/A 09/04/2015   Procedure: INSERTION PORT-A-CATH WITH [place];  Surgeon: Rolm Bookbinder, MD;  Location: [place];  Service: General;  Laterality: N/A;  . TUBAL LIGATION      SOCIAL HISTORY: Social History   Socioeconomic History  . Marital status: Married    Spouse name: Elon Jester Real  . Number of children: 3  . Years of education: Not on file  . Highest education level: Not on file  Occupational History  . Not on file  Social Needs  . Financial resource strain: Not on file  . Food insecurity:    Worry: Not on file    Inability: Not on file  . Transportation needs:    Medical: No    Non-medical: No  Tobacco Use  . Smoking status: Never Smoker  . Smokeless tobacco: Never Used  Substance and Sexual Activity  . Alcohol  use: No  . Drug use: No  . Sexual activity: Yes    Birth control/protection: Surgical  Lifestyle  . Physical activity:    Days per week: Not on file    Minutes per session: Not on file  . Stress: Not on file  Relationships  . Social connections:    Talks on phone: Not on file    Gets together: Not on file    Attends religious service: Not on file    Active member of club or organization: Not on file    Attends meetings of clubs or organizations: Not on file    Relationship status: Not on file  . Intimate partner violence:    Fear of current or ex partner: Not on file    Emotionally abused: Not on file    Physically abused: Not  on file    Forced sexual activity: Not on file  Other Topics Concern  . Not on file  Social History Narrative   3 children    [name] age 14, lives in [place]    [name] age 49 lives in [place]   [name] age 33 lives in [place]: Family History  Problem Relation Age of Onset  . Diabetes Mother     Review of Systems  Constitutional: Negative for appetite change, chills, fatigue, fever and unexpected weight change.  HENT:   Negative for hearing loss, lump/mass, sore throat and trouble swallowing.   Eyes: Negative for eye problems and icterus.  Respiratory: Negative for chest tightness, cough and shortness of breath.   Cardiovascular: Negative for chest pain, leg swelling and palpitations.  Gastrointestinal: Negative for abdominal distention, abdominal pain, constipation, diarrhea, nausea and vomiting.  Endocrine: Negative for hot flashes.  Genitourinary: Negative for difficulty urinating.   Musculoskeletal: Negative for arthralgias, back pain and neck pain.  Skin: Negative for itching and rash.  Neurological: Negative for dizziness, extremity weakness, headaches and numbness.  Hematological: Negative for adenopathy. Does not bruise/bleed easily.  Psychiatric/Behavioral: Negative for depression. The  patient is not nervous/anxious.       PHYSICAL EXAMINATION  ECOG PERFORMANCE STATUS: 1 - Symptomatic but completely ambulatory  Vitals:   06/25/18 0909  BP: (!) 169/80  Pulse: 61  Resp: 18  Temp: (!) 97.5 F (36.4 C)  SpO2: 100%    Physical Exam Constitutional:      General: She is not in acute distress.    Appearance: Normal appearance.  HENT:     Head: Normocephalic and atraumatic.     Nose: No congestion.     Mouth/Throat:     Mouth: Mucous membranes are moist.     Pharynx: Oropharynx is clear. No oropharyngeal exudate or posterior oropharyngeal erythema.  Eyes:     General: No scleral icterus.    Pupils: Pupils are equal, round, and reactive to light.  Neck:     Musculoskeletal: Neck supple.  Cardiovascular:     Rate and Rhythm: Normal rate and regular rhythm.     Pulses: Normal pulses.     Heart sounds: Normal heart sounds.  Pulmonary:     Effort: Pulmonary effort is normal.     Breath sounds: Normal breath sounds.  Abdominal:     General: Abdomen is flat. Bowel sounds are normal. There is no distension.     Palpations: Abdomen is soft.     Tenderness: There is no abdominal tenderness.  Musculoskeletal:        General: No swelling.  Lymphadenopathy:     Cervical: No cervical adenopathy.  Skin:    General: Skin is warm and dry.     Capillary Refill: Capillary refill takes less than 2 seconds.     Findings: No rash.  Neurological:     General: No focal deficit present.     Mental Status: She is alert and oriented to person, place, and time.  Psychiatric:        Mood and Affect: Mood normal.     LABORATORY DATA:  CBC    Component Value Date/Time   WBC 2.7 (L) 06/25/2018 0854   WBC 3.1 (L) 09/25/2017 0835   RBC 3.38 (L) 06/25/2018 0854   HGB 11.5 (L) 06/25/2018 0854   HGB 13.8 04/14/2017 0919   HCT 34.0 (L) 06/25/2018 0854   HCT 41.0 04/14/2017 0919  PLT 192 06/25/2018 0854   PLT 211 04/14/2017 0919   MCV 100.6 (H) 06/25/2018 0854   MCV  86.6 04/14/2017 0919   MCH 34.0 06/25/2018 0854   MCHC 33.8 06/25/2018 0854   RDW 13.1 06/25/2018 0854   RDW 13.2 04/14/2017 0919   LYMPHSABS PENDING 06/25/2018 0854   LYMPHSABS 1.7 04/14/2017 0919   MONOABS PENDING 06/25/2018 0854   MONOABS 0.5 04/14/2017 0919   EOSABS PENDING 06/25/2018 0854   EOSABS 0.1 04/14/2017 0919   BASOSABS PENDING 06/25/2018 0854   BASOSABS 0.0 04/14/2017 0919    CMP     Component Value Date/Time   NA 139 05/28/2018 0840   NA 137 04/14/2017 0919   K 3.9 05/28/2018 0840   K 3.7 04/14/2017 0919   CL 106 05/28/2018 0840   CO2 25 05/28/2018 0840   CO2 23 04/14/2017 0919   GLUCOSE 113 (H) 05/28/2018 0840   GLUCOSE 100 04/14/2017 0919   BUN 12 05/28/2018 0840   BUN 12.5 04/14/2017 0919   CREATININE 0.64 05/28/2018 0840   CREATININE 0.7 04/14/2017 0919   CALCIUM 8.9 05/28/2018 0840   CALCIUM 9.5 04/14/2017 0919   PROT 6.6 05/28/2018 0840   PROT 7.8 04/14/2017 0919   ALBUMIN 3.5 05/28/2018 0840   ALBUMIN 4.2 04/14/2017 0919   AST 21 05/28/2018 0840   AST 29 04/14/2017 0919   ALT 22 05/28/2018 0840   ALT 31 04/14/2017 0919   ALKPHOS 51 05/28/2018 0840   ALKPHOS 74 04/14/2017 0919   BILITOT 0.6 05/28/2018 0840   BILITOT 0.94 04/14/2017 0919   GFRNONAA >60 05/28/2018 0840   GFRAA >60 05/28/2018 0840          ASSESSMENT and PLAN:   Bilateral breast cancer ([place]) Treatment Summary: 1. Neoadjuvant chemotherapy with either AC followed by THP started 09/13/2015 and completed 12 weeks of Taxol 11/29/2015, Herceptin Perjeta 6; now on Herceptin  2. Bilateral mastectomies and lymph node surgery 02/11/16 3. Adjuvant radiation 04/07/2016 to 05/15/2016 4. Adjuvant antiestrogen therapy with Letrozole started 06/26/2016 5. Neratinib after Herceptin maintenancediscontinued 07/13/2017 when she developed metastatic disease 6.  Ibrance with letrozole started March 2019 stopped 04/29/2018 due to progression 7.  Palliative radiation L2-4 and left pelvis  completed 05/19/2018  CT CAP: 04/26/2018 interval development of multiple lytic bone metastases.  Lesion in the left acetabulum measures 5.5 cm, new lesion L3 extending to the left pedicle.  Perihilar nodule left lower lobe increased in size 2.8 cm from 1.8 cm, other nodules not changed.  Similar appearance of left hilar adenopathy, small perinephric soft tissue nodule increased in size _______________________________________________________________________________________ Current treatment: Faslodex Ibrance along with letrozole, Xgeva Return to clinic monthly for Faslodex injections and Xgeva.  Felicia [name] is doing well.  She has no clinical sign of progression and is tolerating her treatment well.  She has no significant side effects other than pain at the injection site.  She can take tylenol or apply ice to this.  In one note it says that Felicia [name] should get Delton See every three months, and in the orders and per the patient she is getting it monthly.  So long as her calcium is within parameters she will receive Faslodex and Xgeva today and I will alert Dr. Lindi Adie to this so he can make changes if needed to her plan.  She is tolerating it well.  She will continue taking Letrozole and Ibrance as prescribed.  Labs are stable.  We will see Felicia [name] back next month for labs, f/u, and her  next injections.  She will be due for restaging in April of 2020.     All questions were answered. The patient knows to call the clinic with any problems, questions or concerns. We can certainly see the patient much sooner if necessary.  A total of (30) minutes of face-to-face time was spent with this patient with greater than 50% of that time in counseling and care-coordination.  This note was electronically signed. Scot Dock, NP 06/25/2018

## 2018-06-25 NOTE — Telephone Encounter (Signed)
Gave avs and calendar ° °

## 2018-06-25 NOTE — Assessment & Plan Note (Addendum)
Treatment Summary: 1. Neoadjuvant chemotherapy with either AC followed by THP started 09/13/2015 and completed 12 weeks of Taxol 11/29/2015, Herceptin Perjeta 6; now on Herceptin  2. Bilateral mastectomies and lymph node surgery 02/11/16 3. Adjuvant radiation 04/07/2016 to 05/15/2016 4. Adjuvant antiestrogen therapy with Letrozole started 06/26/2016 5. Neratinib after Herceptin maintenancediscontinued 07/13/2017 when she developed metastatic disease 6.  Ibrance with letrozole started March 2019 stopped 04/29/2018 due to progression 7.  Palliative radiation L2-4 and left pelvis completed 05/19/2018  CT CAP: 04/26/2018 interval development of multiple lytic bone metastases.  Lesion in the left acetabulum measures 5.5 cm, new lesion L3 extending to the left pedicle.  Perihilar nodule left lower lobe increased in size 2.8 cm from 1.8 cm, other nodules not changed.  Similar appearance of left hilar adenopathy, small perinephric soft tissue nodule increased in size _______________________________________________________________________________________ Current treatment: Faslodex Ibrance along with letrozole, Xgeva Return to clinic monthly for Faslodex injections and Xgeva.  Jana Half is doing well.  She has no clinical sign of progression and is tolerating her treatment well.  She has no significant side effects other than pain at the injection site.  She can take tylenol or apply ice to this.  In one note it says that Jana Half should get Delton See every three months, and in the orders and per the patient she is getting it monthly.  So long as her calcium is within parameters she will receive Faslodex and Xgeva today and I will alert Dr. Lindi Adie to this so he can make changes if needed to her plan.  She is tolerating it well.  She will continue taking Letrozole and Ibrance as prescribed.  Labs are stable.  We will see Jana Half back next month for labs, f/u, and her next injections.  She will be due for restaging in  April of 2020.

## 2018-07-08 ENCOUNTER — Telehealth: Payer: Self-pay

## 2018-07-08 NOTE — Telephone Encounter (Signed)
Oral Oncology Patient Advocate Encounter  I was notified by [employer] that the [employer] assistance application needs to be renewed. It will expire on 07/15/18.  I called the patients Daughter, Felicia [name] to discuss this and had to leave a voicemail.  This encounter will be updated until final determination.  [place] Patient [name] Phone [phone] Fax [phone] 07/08/2018   3:52 PM

## 2018-07-12 NOTE — Telephone Encounter (Signed)
Oral Oncology Patient Advocate Encounter  I spoke with Gregary Signs, [place] translator and she said she would also try to reach the patient.  [place] Patient [place] Phone [phone] Fax [phone] 07/12/2018   10:29 AM

## 2018-07-20 NOTE — Telephone Encounter (Signed)
Oral Oncology Patient Advocate Encounter  I still haven't heard from or seen the patient. I spoke with Gregary Signs today and she will call the patient and her daughter again.  This encounter will be updated until final determination.  [place] Patient [place] Phone [phone] Fax [phone] 07/20/2018   2:23 PM

## 2018-07-21 NOTE — Assessment & Plan Note (Signed)
Treatment Summary: 1. Neoadjuvant chemotherapy with either AC followed by THP started 09/13/2015 and completed 12 weeks of Taxol 11/29/2015, Herceptin Perjeta 6; now on Herceptin  2. Bilateral mastectomies and lymph node surgery 02/11/16 3. Adjuvant radiation 04/07/2016 to 05/15/2016 4. Adjuvant antiestrogen therapy with Letrozole started 06/26/2016 5. Neratinib after Herceptin maintenancediscontinued 07/13/2017 when she developed metastatic disease 6.Ibrance with letrozole started March 2019 stopped 04/29/2018 due to progression 7.  Palliative radiation L2-4 and left pelvis completed 05/19/2018  CT CAP: 04/26/2018 interval development of multiple lytic bone metastases. Lesion in the left acetabulum measures 5.5 cm, new lesion L3 extending to the left pedicle. Perihilar nodule left lower lobe increased in size 2.8 cm from 1.8 cm, other nodules not changed. Similar appearance of left hilar adenopathy, small perinephric soft tissue nodule increased in size _______________________________________________________________________________________ Current treatment: Faslodex Ibrance along with letrozole, Xgeva Return to clinic monthly for Faslodex injections and Xgeva.  Toxicities: No evidence of side effects to current treatment Monitoring blood counts Return to clinic in April for follow-up with CT CAP and bone scans done prior to that.

## 2018-07-22 ENCOUNTER — Other Ambulatory Visit: Payer: Self-pay | Admitting: *Deleted

## 2018-07-22 DIAGNOSIS — C7800 Secondary malignant neoplasm of unspecified lung: Secondary | ICD-10-CM

## 2018-07-22 NOTE — Progress Notes (Signed)
Patient Care Team: [name], MD as PCP - General (Family Medicine) Nicholas Lose, MD as Consulting Physician (Hematology and Oncology) Delice Bison, Charlestine Massed, NP as Nurse Practitioner (Hematology and Oncology) Kyung Rudd, MD as Consulting Physician (Radiation Oncology) Rolm Bookbinder, MD as Consulting Physician (General Surgery)  DIAGNOSIS:    ICD-10-CM   1. 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, estrogen receptor positive ([place]) C50.011    Z17.0    C50.012     SUMMARY OF ONCOLOGIC HISTORY:   Bilateral breast cancer ([place])   08/16/2015 Mammogram    Left breast LIQ 4.2 cm, 4 mm oval mass 12:00, 1.2 cm mass left UOQ, enlarged left axillary lymph node    08/16/2015 Mammogram    Right breast UIQ: 3.9 cm irregular mass, 1.5 cm mass in the right LOQ, 9 mm mass right UOQ, 6 mm mass right LIQ, enlarged lymph node Rt axilla    08/20/2015 Initial Diagnosis    Right breast biopsy 1:00: IDC grade 2 ER 95%, PR 2%, Ki 67 15%; HER-2 negative ratio 1.04; 10:00: IDC grade 2-3 dissimilar to 1:00 biopsy 10:00: ER 95%, PR 0%, K67: 25%, HER-2 positive ratio 6.42 right axillary lymph node positive for metastatic cancer    08/20/2015 Procedure    Left biopsy 8:00: IDC with DCIS, grade 2; ER 95%, PR 95%, Ki-67 30%, HER-2 negative ratio 1.2; 12:00: IDC with DCIS, grade 1 ER 95%, PR 95%, Ki-67 10% HER-2 negative ratio 1.14 not similar to this biopsy, left axillary lymph node positive,     09/13/2015 - 01/17/2016 Neo-Adjuvant Chemotherapy    Neoadjuvant dose dense Adriamycin and Cytoxan followed by Taxol Herceptin Perjeta, followed by Herceptin maintenance    01/21/2016 Breast MRI    Bilateral breast masses significantly decreased in size with mild residual NME, complete resolution of numerous other nodules in both breasts, bilateral axillary lymph nodes and bilateral retropectoral lymph nodes decreased in size    02/11/2016 Surgery    Right mastectomy: IDC 2 nodules, 2.5, 1 cm,  margins negative, 3/7 lymph nodes positive T2 N1a stage II B, ER 95%, PR 2%, HER-2 negative, Ki-67 15% RCB burden 3.1 and 2.9 class II    02/11/2016 Surgery    Left mastectomy: IDC 3.1 cm with DCIS, margins negative, 0/8 lymph nodes, T2 N0 stage II a, RCB burden 2.18, class II, ER 100%, PR 95%, HER-2 negative ratio 1.2, Ki-67 30%     04/07/2016 - 05/15/2016 Radiation Therapy    Adjuvant radiation    06/26/2016 - 07/13/2017 Anti-estrogen oral therapy    Letrozole 2.5 mg daily    11/20/2016 Miscellaneous    Neratinib 240 mg daily discontinued 07/13/2017 when she presented with metastatic disease to the lungs    07/13/2017 Relapse/Recurrence    Progression of right and left hilar lymph nodes and bilateral lung nodules    07/13/2017 -  Anti-estrogen oral therapy    Ibrance with letrozole    10/24/2017 Imaging    CT chest abdomen pelvis: Interval decrease in the left hilar node.  The more superior left hilar node is slightly increased in size.  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  Lesion in the left acetabulum measures 5.5 cm, new lesion L3 extending to the left pedicle.  Perihilar nodule left lower lobe increased in size 2.8 cm from 1.8 cm, other nodules not changed.  Similar appearance of left hilar adenopathy,  small perinephric soft tissue nodule increased in size     CHIEF COMPLIANT: Follow-up of Ibrance with letrozole  INTERVAL HISTORY: Felicia [name] is a 58 y.o. with above-mentioned history of metastatic breast cancer currently on Ibrance with letrozole. She presents to the clinic alone today for a toxicity check and to review lab work done today. C/O worsening pain in back and Hips. Better when she stands. Not taking any pain  meds.  REVIEW OF SYSTEMS:   Constitutional: Denies fevers, chills or abnormal weight loss Eyes: Denies blurriness of vision Ears, nose, mouth,  throat, and face: Denies mucositis or sore throat Respiratory: Denies cough, dyspnea or wheezes Cardiovascular: Denies palpitation, chest discomfort Gastrointestinal: Denies nausea, heartburn or change in bowel habits Skin: Denies abnormal skin rashes Lymphatics: Denies new lymphadenopathy or easy bruising Neurological: Denies numbness, tingling or new weaknesses Behavioral/Psych: Mood is stable, no new changes  Extremities: Back and Hip pain Breast: denies any pain or lumps or nodules in either breasts All other systems were reviewed with the patient and are negative.  I have reviewed the past medical history, past surgical history, social history and family history with the patient and they are unchanged from previous note.  ALLERGIES:  is allergic to no known allergies.  MEDICATIONS:  Current Outpatient Medications  Medication Sig Dispense Refill  . Calcium Carb-Cholecalciferol (CALCIUM 600+D) 600-800 MG-UNIT TABS Take 1 tablet by mouth daily.    . Cholecalciferol (VITAMIN D-3) 5000 units TABS Take 1 tablet by mouth daily.    Marland Kitchen letrozole (FEMARA) 2.5 MG tablet TAKE 1 TABLET BY MOUTH DAILY. 90 tablet 0  . lidocaine-prilocaine (EMLA) cream Apply 1 application topically as needed (Apply 2- hours prior to access of port). 30 g 6  . lisinopril (PRINIVIL,ZESTRIL) 20 MG tablet Take 1 tablet (20 mg total) by mouth daily. 30 tablet 6  . palbociclib (IBRANCE) 125 MG capsule Take 1 capsule (125 mg total) by mouth daily with breakfast. Take for 21 days on, 7 days off, repeated every 28 days. Take whole with food. 21 capsule 6  . pantoprazole (PROTONIX) 40 MG tablet Take 1 tablet (40 mg total) by mouth daily. 30 tablet 0  . potassium chloride (K-DUR,KLOR-CON) 10 MEQ tablet TAKE 2 TABLETS (20 MEQ TOTAL) BY MOUTH 2 TIMES DAILY. 120 tablet 2   No current facility-administered medications for this visit.    Facility-Administered Medications Ordered in Other Visits  Medication Dose Route Frequency  Provider Last Rate Last Dose  . heparin lock flush 100 unit/mL  500 Units Intravenous Once PRN Nicholas Lose, MD      . sodium chloride flush (NS) 0.9 % injection 10 mL  10 mL Intravenous PRN Nicholas Lose, MD   10 mL at 04/14/17 0831  . sodium chloride flush (NS) 0.9 % injection 10 mL  10 mL Intravenous PRN Nicholas Lose, MD        PHYSICAL EXAMINATION: ECOG PERFORMANCE STATUS: 1 - Symptomatic but completely ambulatory  There were no vitals filed for this visit. There were no vitals filed for this visit.  GENERAL: alert, no distress and comfortable SKIN: skin color, texture, turgor are normal, no rashes or significant lesions EYES: normal, Conjunctiva are pink and non-injected, sclera clear OROPHARYNX: no exudate, no erythema and lips, buccal mucosa, and tongue normal  NECK: supple, thyroid normal size, non-tender, without nodularity LYMPH: no palpable lymphadenopathy in the cervical, axillary or inguinal LUNGS: clear to auscultation and percussion with normal breathing effort HEART: regular rate & rhythm and  no murmurs and no lower extremity edema ABDOMEN: abdomen soft, non-tender and normal bowel sounds MUSCULOSKELETAL: no cyanosis of digits and no clubbing  NEURO: alert & oriented x 3 with fluent speech, no focal motor/sensory deficits EXTREMITIES: No lower extremity edema  LABORATORY DATA:  I have reviewed the data as listed CMP Latest Ref Rng & Units 06/25/2018 05/28/2018 05/14/2018  Glucose 70 - 99 mg/dL 114(H) 113(H) 96  BUN 6 - 20 mg/dL _0 Creatinine 0.44 - 1.00 mg/dL 0.66 0.64 0.62  Sodium 135 - 145 mmol/L 140 139 140  Potassium 3.5 - 5.1 mmol/L 3.8 3.9 3.9  Chloride 98 - 111 mmol/L 108 106 109  CO2 22 - 32 mmol/L _1 Calcium 8.9 - 10.3 mg/dL 8.5(L) 8.9 8.7(L)  Total Protein 6.5 - 8.1 g/dL 7.0 6.6 7.0  Total Bilirubin 0.3 - 1.2 mg/dL 0.7 0.6 0.6  Alkaline Phos 38 - 126 U/L 47 51 65  AST 15 - 41 U/L _2 ALT 0 - 44 U/L _3 Lab Results   Component Value Date   WBC 2.7 (L) 06/25/2018   HGB 11.5 (L) 06/25/2018   HCT 34.0 (L) 06/25/2018   MCV 100.6 (H) 06/25/2018   PLT 192 06/25/2018   NEUTROABS 1.5 (L) 06/25/2018    ASSESSMENT & PLAN:  Bilateral breast cancer ([place]) Treatment Summary: 1. Neoadjuvant chemotherapy with either AC followed by THP started 09/13/2015 and completed 12 weeks of Taxol 11/29/2015, Herceptin Perjeta 6; now on Herceptin  2. Bilateral mastectomies and lymph node surgery 02/11/16 3. Adjuvant radiation 04/07/2016 to 05/15/2016 4. Adjuvant antiestrogen therapy with Letrozole started 06/26/2016 5. Neratinib after Herceptin maintenancediscontinued 07/13/2017 when she developed metastatic disease 6.Ibrance with letrozole started March 2019 stopped 04/29/2018 due to progression 7.  Palliative radiation L2-4 and left pelvis completed 05/19/2018  CT CAP: 04/26/2018 interval development of multiple lytic bone metastases. Lesion in the left acetabulum measures 5.5 cm, new lesion L3 extending to the left pedicle. Perihilar nodule left lower lobe increased in size 2.8 cm from 1.8 cm, other nodules not changed. Similar appearance of left hilar adenopathy, small perinephric soft tissue nodule increased in size _______________________________________________________________________________________ Current treatment: Faslodex Ibrance along with letrozole, Xgeva Return to clinic monthly for Faslodex injections and Xgeva.  Toxicities: No evidence of side effects to current treatment Back nad Hip Pain: Will get CT CAp to see if theres any evidence of progression of disease. Will call her with results of the scans.  Monitoring blood counts Return to clinic in April for follow-up with labs and Injections    No orders of the defined types were placed in this encounter.  The patient has a good understanding of the overall plan. she agrees with it. she will call with any problems that may develop before the next  visit here.  I communicated with the patient via a Webex video conference and verified that I am speaking with the correct person using two identifiers. I discussed the limitations, risks, security and privacy concerns of performing an evaluation and management service by Webex. I also discussed with the patient that there may be a patient responsible charge related to this service. The patient expressed understanding and agreed to proceed.   Nicholas Lose, MD 07/23/2018  Julious Oka Dorshimer am acting as scribe for Dr. Nicholas Lose.  I have reviewed the above documentation for accuracy and completeness, and I agree with the above.

## 2018-07-23 ENCOUNTER — Other Ambulatory Visit: Payer: Self-pay | Admitting: Hematology and Oncology

## 2018-07-23 ENCOUNTER — Ambulatory Visit: Payer: Self-pay

## 2018-07-23 ENCOUNTER — Other Ambulatory Visit: Payer: Self-pay

## 2018-07-23 ENCOUNTER — Inpatient Hospital Stay: Payer: Self-pay

## 2018-07-23 ENCOUNTER — Inpatient Hospital Stay: Payer: Self-pay | Attending: Hematology and Oncology | Admitting: Hematology and Oncology

## 2018-07-23 DIAGNOSIS — Z95828 Presence of other vascular implants and grafts: Secondary | ICD-10-CM

## 2018-07-23 DIAGNOSIS — C50011 Malignant neoplasm of nipple and areola, right female breast: Secondary | ICD-10-CM | POA: Insufficient documentation

## 2018-07-23 DIAGNOSIS — C50012 Malignant neoplasm of nipple and areola, left female breast: Principal | ICD-10-CM

## 2018-07-23 DIAGNOSIS — Z923 Personal history of irradiation: Secondary | ICD-10-CM | POA: Insufficient documentation

## 2018-07-23 DIAGNOSIS — Z170 Estrogen receptor positive status [ER+]: Principal | ICD-10-CM

## 2018-07-23 DIAGNOSIS — C7800 Secondary malignant neoplasm of unspecified lung: Secondary | ICD-10-CM | POA: Insufficient documentation

## 2018-07-23 DIAGNOSIS — Z9013 Acquired absence of bilateral breasts and nipples: Secondary | ICD-10-CM | POA: Insufficient documentation

## 2018-07-23 DIAGNOSIS — C7951 Secondary malignant neoplasm of bone: Secondary | ICD-10-CM | POA: Insufficient documentation

## 2018-07-23 DIAGNOSIS — Z79811 Long term (current) use of aromatase inhibitors: Secondary | ICD-10-CM

## 2018-07-23 DIAGNOSIS — Z79899 Other long term (current) drug therapy: Secondary | ICD-10-CM | POA: Insufficient documentation

## 2018-07-23 DIAGNOSIS — Z9221 Personal history of antineoplastic chemotherapy: Secondary | ICD-10-CM | POA: Insufficient documentation

## 2018-07-23 LAB — CBC WITH DIFFERENTIAL (CANCER CENTER ONLY)
Abs Immature Granulocytes: 0.01 10*3/uL (ref 0.00–0.07)
BASOS ABS: 0 10*3/uL (ref 0.0–0.1)
Basophils Relative: 1 %
Eosinophils Absolute: 0 10*3/uL (ref 0.0–0.5)
Eosinophils Relative: 1 %
HCT: 35.1 % — ABNORMAL LOW (ref 36.0–46.0)
Hemoglobin: 12 g/dL (ref 12.0–15.0)
Immature Granulocytes: 0 %
Lymphocytes Relative: 27 %
Lymphs Abs: 1 10*3/uL (ref 0.7–4.0)
MCH: 34 pg (ref 26.0–34.0)
MCHC: 34.2 g/dL (ref 30.0–36.0)
MCV: 99.4 fL (ref 80.0–100.0)
Monocytes Absolute: 0.2 10*3/uL (ref 0.1–1.0)
Monocytes Relative: 6 %
NRBC: 0 % (ref 0.0–0.2)
Neutro Abs: 2.3 10*3/uL (ref 1.7–7.7)
Neutrophils Relative %: 65 %
Platelet Count: 207 10*3/uL (ref 150–400)
RBC: 3.53 MIL/uL — ABNORMAL LOW (ref 3.87–5.11)
RDW: 12.7 % (ref 11.5–15.5)
WBC: 3.5 10*3/uL — AB (ref 4.0–10.5)

## 2018-07-23 LAB — CMP (CANCER CENTER ONLY)
ALT: 33 U/L (ref 0–44)
AST: 35 U/L (ref 15–41)
Albumin: 3.9 g/dL (ref 3.5–5.0)
Alkaline Phosphatase: 62 U/L (ref 38–126)
Anion gap: 7 (ref 5–15)
BUN: 11 mg/dL (ref 6–20)
CO2: 27 mmol/L (ref 22–32)
Calcium: 9.5 mg/dL (ref 8.9–10.3)
Chloride: 105 mmol/L (ref 98–111)
Creatinine: 0.69 mg/dL (ref 0.44–1.00)
GFR, Est AFR Am: >60 mL/min (ref >60)
GFR, Estimated: >60 mL/min (ref >60)
Glucose, Bld: 102 mg/dL — ABNORMAL HIGH (ref 70–99)
POTASSIUM: 4 mmol/L (ref 3.5–5.1)
Sodium: 139 mmol/L (ref 135–145)
Total Bilirubin: 0.7 mg/dL (ref 0.3–1.2)
Total Protein: 7.4 g/dL (ref 6.5–8.1)

## 2018-07-23 MED ORDER — SODIUM CHLORIDE 0.9% FLUSH
10.0000 mL | INTRAVENOUS | Status: DC | PRN
  Administered 2018-07-23: 10 mL via INTRAVENOUS
  Filled 2018-07-23: qty 10

## 2018-07-23 MED ORDER — PALBOCICLIB 125 MG PO CAPS: 125.0000 mg | ORAL_CAPSULE | Freq: Every day | ORAL | 6 refills | Status: DC

## 2018-07-23 MED ORDER — FULVESTRANT 250 MG/5ML IM SOLN
INTRAMUSCULAR | Status: AC
  Filled 2018-07-23: qty 5

## 2018-07-23 MED ORDER — DENOSUMAB 120 MG/1.7ML ~~LOC~~ SOLN
SUBCUTANEOUS | Status: AC
  Filled 2018-07-23: qty 1.7

## 2018-07-23 MED ORDER — LETROZOLE 2.5 MG PO TABS: 2.5000 mg | ORAL_TABLET | Freq: Every day | ORAL | 3 refills | Status: AC

## 2018-07-23 MED ORDER — DENOSUMAB 120 MG/1.7ML ~~LOC~~ SOLN
120.0000 mg | Freq: Once | SUBCUTANEOUS | Status: AC
  Administered 2018-07-23: 120 mg via SUBCUTANEOUS

## 2018-07-23 MED ORDER — HEPARIN SOD (PORK) LOCK FLUSH 100 UNIT/ML IV SOLN
500.0000 [IU] | Freq: Once | INTRAVENOUS | Status: AC | PRN
  Administered 2018-07-23: 500 [IU] via INTRAVENOUS
  Filled 2018-07-23: qty 5

## 2018-07-23 MED ORDER — FULVESTRANT 250 MG/5ML IM SOLN
500.0000 mg | Freq: Once | INTRAMUSCULAR | Status: AC
  Administered 2018-07-23: 500 mg via INTRAMUSCULAR

## 2018-07-23 NOTE — Patient Instructions (Signed)
Denosumab injection What is this medicine? DENOSUMAB (den oh sue mab) slows bone breakdown. Prolia is used to treat osteoporosis in women after menopause and in men, and in people who are taking corticosteroids for 6 months or more. Xgeva is used to treat a high calcium level due to cancer and to prevent bone fractures and other bone problems caused by multiple myeloma or cancer bone metastases. Xgeva is also used to treat giant cell tumor of the bone. This medicine may be used for other purposes; ask your health care provider or pharmacist if you have questions. COMMON BRAND NAME(S): Prolia, XGEVA What should I tell my health care provider before I take this medicine? They need to know if you have any of these conditions: -dental disease -having surgery or tooth extraction -infection -kidney disease -low levels of calcium or Vitamin D in the blood -malnutrition -on hemodialysis -skin conditions or sensitivity -thyroid or parathyroid disease -an unusual reaction to denosumab, other medicines, foods, dyes, or preservatives -pregnant or trying to get pregnant -breast-feeding How should I use this medicine? This medicine is for injection under the skin. It is given by a health care professional in a hospital or clinic setting. A special MedGuide will be given to you before each treatment. Be sure to read this information carefully each time. For Prolia, talk to your pediatrician regarding the use of this medicine in children. Special care may be needed. For Xgeva, talk to your pediatrician regarding the use of this medicine in children. While this drug may be prescribed for children as young as 13 years for selected conditions, precautions do apply. Overdosage: If you think you have taken too much of this medicine contact a poison control center or emergency room at once. NOTE: This medicine is only for you. Do not share this medicine with others. What if I miss a dose? It is important not to  miss your dose. Call your doctor or health care professional if you are unable to keep an appointment. What may interact with this medicine? Do not take this medicine with any of the following medications: -other medicines containing denosumab This medicine may also interact with the following medications: -medicines that lower your chance of fighting infection -steroid medicines like prednisone or cortisone This list may not describe all possible interactions. Give your health care provider a list of all the medicines, herbs, non-prescription drugs, or dietary supplements you use. Also tell them if you smoke, drink alcohol, or use illegal drugs. Some items may interact with your medicine. What should I watch for while using this medicine? Visit your doctor or health care professional for regular checks on your progress. Your doctor or health care professional may order blood tests and other tests to see how you are doing. Call your doctor or health care professional for advice if you get a fever, chills or sore throat, or other symptoms of a cold or flu. Do not treat yourself. This drug may decrease your body's ability to fight infection. Try to avoid being around people who are sick. You should make sure you get enough calcium and vitamin D while you are taking this medicine, unless your doctor tells you not to. Discuss the foods you eat and the vitamins you take with your health care professional. See your dentist regularly. Brush and floss your teeth as directed. Before you have any dental work done, tell your dentist you are receiving this medicine. Do not become pregnant while taking this medicine or for 5 months   after stopping it. Talk with your doctor or health care professional about your birth control options while taking this medicine. Women should inform their doctor if they wish to become pregnant or think they might be pregnant. There is a potential for serious side effects to an unborn  child. Talk to your health care professional or pharmacist for more information. What side effects may I notice from receiving this medicine? Side effects that you should report to your doctor or health care professional as soon as possible: -allergic reactions like skin rash, itching or hives, swelling of the face, lips, or tongue -bone pain -breathing problems -dizziness -jaw pain, especially after dental work -redness, blistering, peeling of the skin -signs and symptoms of infection like fever or chills; cough; sore throat; pain or trouble passing urine -signs of low calcium like fast heartbeat, muscle cramps or muscle pain; pain, tingling, numbness in the hands or feet; seizures -unusual bleeding or bruising -unusually weak or tired Side effects that usually do not require medical attention (report to your doctor or health care professional if they continue or are bothersome): -constipation -diarrhea -headache -joint pain -loss of appetite -muscle pain -runny nose -tiredness -upset stomach This list may not describe all possible side effects. Call your doctor for medical advice about side effects. You may report side effects to FDA at 1-800-FDA-1088. Where should I keep my medicine? This medicine is only given in a clinic, doctor's office, or other health care setting and will not be stored at home. NOTE: This sheet is a summary. It may not cover all possible information. If you have questions about this medicine, talk to your doctor, pharmacist, or health care provider.  2019 Elsevier/Gold Standard (2017-08-21 16:10:44) Fulvestrant injection What is this medicine? FULVESTRANT (ful VES trant) blocks the effects of estrogen. It is used to treat breast cancer. This medicine may be used for other purposes; ask your health care provider or pharmacist if you have questions. COMMON BRAND NAME(S): FASLODEX What should I tell my health care provider before I take this medicine? They  need to know if you have any of these conditions: -bleeding disorders -liver disease -low blood counts, like low white cell, platelet, or red cell counts -an unusual or allergic reaction to fulvestrant, other medicines, foods, dyes, or preservatives -pregnant or trying to get pregnant -breast-feeding How should I use this medicine? This medicine is for injection into a muscle. It is usually given by a health care professional in a hospital or clinic setting. Talk to your pediatrician regarding the use of this medicine in children. Special care may be needed. Overdosage: If you think you have taken too much of this medicine contact a poison control center or emergency room at once. NOTE: This medicine is only for you. Do not share this medicine with others. What if I miss a dose? It is important not to miss your dose. Call your doctor or health care professional if you are unable to keep an appointment. What may interact with this medicine? -medicines that treat or prevent blood clots like warfarin, enoxaparin, dalteparin, apixaban, dabigatran, and rivaroxaban This list may not describe all possible interactions. Give your health care provider a list of all the medicines, herbs, non-prescription drugs, or dietary supplements you use. Also tell them if you smoke, drink alcohol, or use illegal drugs. Some items may interact with your medicine. What should I watch for while using this medicine? Your condition will be monitored carefully while you are receiving this medicine. You   will need important blood work done while you are taking this medicine. Do not become pregnant while taking this medicine or for at least 1 year after stopping it. Women of child-bearing potential will need to have a negative pregnancy test before starting this medicine. Women should inform their doctor if they wish to become pregnant or think they might be pregnant. There is a potential for serious side effects to an unborn  child. Men should inform their doctors if they wish to father a child. This medicine may lower sperm counts. Talk to your health care professional or pharmacist for more information. Do not breast-feed an infant while taking this medicine or for 1 year after the last dose. What side effects may I notice from receiving this medicine? Side effects that you should report to your doctor or health care professional as soon as possible: -allergic reactions like skin rash, itching or hives, swelling of the face, lips, or tongue -feeling faint or lightheaded, falls -pain, tingling, numbness, or weakness in the legs -signs and symptoms of infection like fever or chills; cough; flu-like symptoms; sore throat -vaginal bleeding Side effects that usually do not require medical attention (report to your doctor or health care professional if they continue or are bothersome): -aches, pains -constipation -diarrhea -headache -hot flashes -nausea, vomiting -pain at site where injected -stomach pain This list may not describe all possible side effects. Call your doctor for medical advice about side effects. You may report side effects to FDA at 1-800-FDA-1088. Where should I keep my medicine? This drug is given in a hospital or clinic and will not be stored at home. NOTE: This sheet is a summary. It may not cover all possible information. If you have questions about this medicine, talk to your doctor, pharmacist, or health care provider.  2019 Elsevier/Gold Standard (2017-07-23 11:34:41)  

## 2018-07-23 NOTE — Telephone Encounter (Signed)
Oral Oncology Patient Advocate Encounter  Met patient in [place] to complete application for [employer] in an effort to reduce patient's out of pocket expense for Ibrance to $0.    Application completed and faxed to [phone].   [employer] patient assistance phone number for follow up is [phone].   This encounter will be updated until final determination.   [place] Patient [place] Phone [phone] Fax [phone] 07/23/2018    9:33 AM

## 2018-07-23 NOTE — Telephone Encounter (Signed)
[employer] needs new RX for [employer] to be faxed.  New Rx printed, will be faxed.

## 2018-07-27 NOTE — Telephone Encounter (Signed)
Oral Oncology Patient Advocate Encounter  I followed up with [facility] today and they needed a page faxed again with a piece of information. I faxed that today and received a successful fax notification.  This encounter will be updated until final determination.  [place] Patient [place] Phone [phone] Fax [phone] 07/27/2018   11:31 AM

## 2018-07-29 NOTE — Telephone Encounter (Signed)
Oral Oncology Patient Advocate Encounter  Received notification from [place] Patient Assistance program that patient has been successfully enrolled into their program to receive Ibrance from the manufacturer at $0 out of pocket until 07/29/19.    I called and spoke to Gregary Signs, the translator and she will call the patient and give her this information.  She knows we will have to re-apply.   Patient knows to call the office with questions or concerns.   Oral Oncology Clinic will continue to follow.  [place] Patient [place] Phone [phone] Fax [phone] 07/29/2018    10:39 AM

## 2018-08-11 NOTE — Assessment & Plan Note (Signed)
Treatment Summary: 1. Neoadjuvant chemotherapy with either AC followed by THP started 09/13/2015 and completed 12 weeks of Taxol 11/29/2015, Herceptin Perjeta 6; now on Herceptin  2. Bilateral mastectomies and lymph node surgery 02/11/16 3. Adjuvant radiation 04/07/2016 to 05/15/2016 4. Adjuvant antiestrogen therapy with Letrozole started 06/26/2016 5. Neratinib after Herceptin maintenancediscontinued 07/13/2017 when she developed metastatic disease 6.Ibrance with letrozole started March 2019 stopped 04/29/2018 due to progression 7. Palliative radiation L2-4 and left pelvis completed 05/19/2018  CT CAP: 04/26/2018 interval development of multiple lytic bone metastases. Lesion in the left acetabulum measures 5.5 cm, new lesion L3 extending to the left pedicle. Perihilar nodule left lower lobe increased in size 2.8 cm from 1.8 cm, other nodules not changed. Similar appearance of left hilar adenopathy, small perinephric soft tissue nodule increased in size _______________________________________________________________________________________ Current treatment: Faslodex Ibrance along with letrozole, Xgeva   Toxicities: No evidence of side effects to current treatment Back nad Hip Pain: Will get CT CAp to see if theres any evidence of progression of disease. Will call her with results of the scans.  Monitoring blood counts Return to clinic monthly for Faslodex injections. We will plan to obtain scans and follow-up in 2 months.

## 2018-08-17 ENCOUNTER — Encounter (HOSPITAL_COMMUNITY): Payer: Self-pay

## 2018-08-17 ENCOUNTER — Ambulatory Visit (HOSPITAL_COMMUNITY): Admission: RE | Admit: 2018-08-17 | Payer: Self-pay | Source: Ambulatory Visit

## 2018-08-17 ENCOUNTER — Ambulatory Visit (HOSPITAL_COMMUNITY)
Admission: RE | Admit: 2018-08-17 | Discharge: 2018-08-17 | Disposition: A | Discharge status: Home / Self Care | Payer: Self-pay | Source: Ambulatory Visit | Attending: Hematology and Oncology | Admitting: Hematology and Oncology

## 2018-08-17 ENCOUNTER — Other Ambulatory Visit: Payer: Self-pay

## 2018-08-17 DIAGNOSIS — C50012 Malignant neoplasm of nipple and areola, left female breast: Secondary | ICD-10-CM | POA: Insufficient documentation

## 2018-08-17 DIAGNOSIS — Z170 Estrogen receptor positive status [ER+]: Secondary | ICD-10-CM | POA: Insufficient documentation

## 2018-08-17 DIAGNOSIS — C50011 Malignant neoplasm of nipple and areola, right female breast: Secondary | ICD-10-CM | POA: Insufficient documentation

## 2018-08-17 MED ORDER — HEPARIN SOD (PORK) LOCK FLUSH 100 UNIT/ML IV SOLN
INTRAVENOUS | Status: AC
  Administered 2018-08-17: 12:00:00 500 [IU]
  Filled 2018-08-17: qty 5

## 2018-08-17 MED ORDER — IOHEXOL 300 MG/ML  SOLN
100.0000 mL | Freq: Once | INTRAMUSCULAR | Status: AC | PRN
  Administered 2018-08-17: 100 mL via INTRAVENOUS

## 2018-08-17 MED ORDER — SODIUM CHLORIDE (PF) 0.9 % IJ SOLN
INTRAMUSCULAR | Status: AC
  Filled 2018-08-17: qty 50

## 2018-08-17 MED ORDER — HEPARIN SOD (PORK) LOCK FLUSH 100 UNIT/ML IV SOLN: 500.0000 [IU] | Freq: Once | INTRAVENOUS | Status: DC

## 2018-08-22 NOTE — Progress Notes (Signed)
Patient Care Team: [name], MD as PCP - General (Family Medicine) Nicholas Lose, MD as Consulting Physician (Hematology and Oncology) Delice Bison, Charlestine Massed, NP as Nurse Practitioner (Hematology and Oncology) Kyung Rudd, MD as Consulting Physician (Radiation Oncology) Rolm Bookbinder, MD as Consulting Physician (General Surgery)  DIAGNOSIS:    ICD-10-CM   1. 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, estrogen receptor positive ([place]) C50.011 CT Chest W Contrast   Z17.0    C50.012     SUMMARY OF ONCOLOGIC HISTORY:   Bilateral breast cancer ([place])   08/16/2015 Mammogram    Left breast LIQ 4.2 cm, 4 mm oval mass 12:00, 1.2 cm mass left UOQ, enlarged left axillary lymph node    08/16/2015 Mammogram    Right breast UIQ: 3.9 cm irregular mass, 1.5 cm mass in the right LOQ, 9 mm mass right UOQ, 6 mm mass right LIQ, enlarged lymph node Rt axilla    08/20/2015 Initial Diagnosis    Right breast biopsy 1:00: IDC grade 2 ER 95%, PR 2%, Ki 67 15%; HER-2 negative ratio 1.04; 10:00: IDC grade 2-3 dissimilar to 1:00 biopsy 10:00: ER 95%, PR 0%, K67: 25%, HER-2 positive ratio 6.42 right axillary lymph node positive for metastatic cancer    08/20/2015 Procedure    Left biopsy 8:00: IDC with DCIS, grade 2; ER 95%, PR 95%, Ki-67 30%, HER-2 negative ratio 1.2; 12:00: IDC with DCIS, grade 1 ER 95%, PR 95%, Ki-67 10% HER-2 negative ratio 1.14 not similar to this biopsy, left axillary lymph node positive,     09/13/2015 - 01/17/2016 Neo-Adjuvant Chemotherapy    Neoadjuvant dose dense Adriamycin and Cytoxan followed by Taxol Herceptin Perjeta, followed by Herceptin maintenance    01/21/2016 Breast MRI    Bilateral breast masses significantly decreased in size with mild residual NME, complete resolution of numerous other nodules in both breasts, bilateral axillary lymph nodes and bilateral retropectoral lymph nodes decreased in size    02/11/2016 Surgery    Right mastectomy: IDC 2  nodules, 2.5, 1 cm, margins negative, 3/7 lymph nodes positive T2 N1a stage II B, ER 95%, PR 2%, HER-2 negative, Ki-67 15% RCB burden 3.1 and 2.9 class II    02/11/2016 Surgery    Left mastectomy: IDC 3.1 cm with DCIS, margins negative, 0/8 lymph nodes, T2 N0 stage II a, RCB burden 2.18, class II, ER 100%, PR 95%, HER-2 negative ratio 1.2, Ki-67 30%     04/07/2016 - 05/15/2016 Radiation Therapy    Adjuvant radiation    06/26/2016 - 07/13/2017 Anti-estrogen oral therapy    Letrozole 2.5 mg daily    11/20/2016 Miscellaneous    Neratinib 240 mg daily discontinued 07/13/2017 when she presented with metastatic disease to the lungs    07/13/2017 Relapse/Recurrence    Progression of right and left hilar lymph nodes and bilateral lung nodules    07/13/2017 -  Anti-estrogen oral therapy    Ibrance with letrozole    10/24/2017 Imaging    CT chest abdomen pelvis: Interval decrease in the left hilar node.  The more superior left hilar node is slightly increased in size.  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  Lesion in the left acetabulum measures 5.5 cm, new lesion L3 extending to the left pedicle.  Perihilar nodule left lower lobe increased in size 2.8 cm from 1.8 cm, other nodules not changed.  Similar appearance of  left hilar adenopathy, small perinephric soft tissue nodule increased in size    05/07/2018 - 05/19/2018 Radiation Therapy    Radiation to L3 (30 Gy in 10 Fx)     CHIEF COMPLIANT: Follow-up ofIbrance with letrozole to review recent scans  INTERVAL HISTORY: Felicia [name] is a 58 y.o. with above-mentioned history of metastatic breast cancer currently on Ibrance with letrozole. A CT chest/abdomen/pelvis from 08/17/18 showed progression of disease in the infrahilar space, new metastases in the left lower lobe of the lung, additional small stable pulmonary nodules, and metastatic  lesion on the L3. She presents to the clinic today for a toxicity check and to review recent scans. Patient is complaining of right hip pain as well as low back pain.  She also had an episode of palpitations and shortness of breath for 24 hours after receiving Faslodex injection last time.  She is unsure about what this is due to.  She denies any chest pain.  She does have some cough with mildly productive sputum and occasional blood in the sputum.  REVIEW OF SYSTEMS:   Constitutional: Denies fevers, chills or abnormal weight loss Eyes: Denies blurriness of vision Ears, nose, mouth, throat, and face: Denies mucositis or sore throat Respiratory: Cough with expectoration Cardiovascular: Denies palpitation, chest discomfort Gastrointestinal: Denies nausea, heartburn or change in bowel habits Skin: Denies abnormal skin rashes Lymphatics: Denies new lymphadenopathy or easy bruising Neurological: Denies numbness, tingling or new weaknesses Behavioral/Psych: Mood is stable, no new changes  Extremities: No lower extremity edema Breast: denies any pain or lumps or nodules in either breasts All other systems were reviewed with the patient and are negative.  I have reviewed the past medical history, past surgical history, social history and family history with the patient and they are unchanged from previous note.  ALLERGIES:  is allergic to no known allergies.  MEDICATIONS:  Current Outpatient Medications  Medication Sig Dispense Refill  . Calcium Carb-Cholecalciferol (CALCIUM 600+D) 600-800 MG-UNIT TABS Take 1 tablet by mouth daily.    . Cholecalciferol (VITAMIN D-3) 5000 units TABS Take 1 tablet by mouth daily.    Marland Kitchen letrozole (FEMARA) 2.5 MG tablet Take 1 tablet (2.5 mg total) by mouth daily. 90 tablet 3  . lidocaine-prilocaine (EMLA) cream Apply 1 application topically as needed (Apply 2- hours prior to access of port). 30 g 6  . lisinopril (PRINIVIL,ZESTRIL) 20 MG tablet Take 1 tablet (20 mg  total) by mouth daily. 30 tablet 6  . palbociclib (IBRANCE) 125 MG capsule Take 1 capsule (125 mg total) by mouth daily with breakfast. Take for 21 days on, 7 days off, repeated every 28 days. Take whole with food. 21 capsule 6  . pantoprazole (PROTONIX) 40 MG tablet TAKE 1 TABLET BY MOUTH ONCE A DAY 30 tablet 0  . potassium chloride (K-DUR,KLOR-CON) 10 MEQ tablet TAKE 2 TABLETS (20 MEQ TOTAL) BY MOUTH 2 TIMES DAILY. 120 tablet 2   No current facility-administered medications for this visit.    Facility-Administered Medications Ordered in Other Visits  Medication Dose Route Frequency Provider Last Rate Last Dose  . sodium chloride flush (NS) 0.9 % injection 10 mL  10 mL Intravenous PRN Nicholas Lose, MD   10 mL at 04/14/17 0831    PHYSICAL EXAMINATION: ECOG PERFORMANCE STATUS: 1 - Symptomatic but completely ambulatory  Vitals:   08/23/18 1148  BP: (!) 164/77  Pulse: (!) 57  Resp: 18  Temp: 98.9 F (37.2 C)  SpO2: 100%   Filed Weights  08/23/18 1148  Weight: 212 lb 1.6 oz (96.2 kg)    GENERAL: alert, no distress and comfortable SKIN: skin color, texture, turgor are normal, no rashes or significant lesions EYES: normal, Conjunctiva are pink and non-injected, sclera clear OROPHARYNX: no exudate, no erythema and lips, buccal mucosa, and tongue normal  NECK: supple, thyroid normal size, non-tender, without nodularity LYMPH: no palpable lymphadenopathy in the cervical, axillary or inguinal LUNGS: clear to auscultation and percussion with normal breathing effort HEART: regular rate & rhythm and no murmurs and no lower extremity edema ABDOMEN: abdomen soft, non-tender and normal bowel sounds MUSCULOSKELETAL: no cyanosis of digits and no clubbing  NEURO: alert & oriented x 3 with fluent speech, no focal motor/sensory deficits EXTREMITIES: No lower extremity edema  LABORATORY DATA:  I have reviewed the data as listed CMP Latest Ref Rng & Units 08/23/2018 07/23/2018 06/25/2018   Glucose 70 - 99 mg/dL 99 102(H) 114(H)  BUN 6 - 20 mg/dL _0 Creatinine 0.44 - 1.00 mg/dL 0.70 0.69 0.66  Sodium 135 - 145 mmol/L 136 139 140  Potassium 3.5 - 5.1 mmol/L 3.6 4.0 3.8  Chloride 98 - 111 mmol/L 103 105 108  CO2 22 - 32 mmol/L _1 Calcium 8.9 - 10.3 mg/dL 8.4(L) 9.5 8.5(L)  Total Protein 6.5 - 8.1 g/dL 7.3 7.4 7.0  Total Bilirubin 0.3 - 1.2 mg/dL 0.4 0.7 0.7  Alkaline Phos 38 - 126 U/L 56 62 47  AST 15 - 41 U/L 75(H) 35 27  ALT 0 - 44 U/L 62(H) 33 31    Lab Results  Component Value Date   WBC 1.6 (L) 08/23/2018   HGB 11.3 (L) 08/23/2018   HCT 33.2 (L) 08/23/2018   MCV 99.4 08/23/2018   PLT 110 (L) 08/23/2018   NEUTROABS 0.8 (L) 08/23/2018    ASSESSMENT & PLAN:  Bilateral breast cancer ([place]) Treatment Summary: 1. Neoadjuvant chemotherapy with either AC followed by THP started 09/13/2015 and completed 12 weeks of Taxol 11/29/2015, Herceptin Perjeta 6; now on Herceptin  2. Bilateral mastectomies and lymph node surgery 02/11/16 3. Adjuvant radiation 04/07/2016 to 05/15/2016 4. Adjuvant antiestrogen therapy with Letrozole started 06/26/2016 5. Neratinib after Herceptin maintenancediscontinued 07/13/2017 when she developed metastatic disease 6.Ibrance with letrozole started March 2019 stopped 04/29/2018 due to progression 7. Palliative radiation L2-4 and left pelvis completed 05/19/2018  CT CAP: 04/26/2018 interval development of multiple lytic bone metastases. Lesion in the left acetabulum measures 5.5 cm, new lesion L3 extending to the left pedicle. Perihilar nodule left lower lobe increased in size 2.8 cm from 1.8 cm, other nodules not changed. Similar appearance of left hilar adenopathy, small perinephric soft tissue nodule increased in size _______________________________________________________________________________________ Current treatment: Faslodex Ibrance along with letrozole, Xgeva   Toxicities: No evidence of side effects to current  treatment Back and Hip Pain: Probably musculoskeletal in nature.  Previously radiated areas appeared to be without any pain. Shortness of breath and chest discomfort and palpitations after injection that lasted 24 hours.  Monitoring and monitoring.  CT CAP 08/17/2018: Interval increase in the left infrahilar airspace disease interval development of new subpleural opacities left lung base, left lower lobe pulmonary artery appears obstructed by mass raising concerns for infarction, stable small lung nodules, bone metastases lytic lesion left acetabulum and left L3 vertebral body stable.  Lymph nodes unchanged in the left hilar and left mediastinal areas.  Possible pulmonary infarction: Could be the cause of some of her symptoms  Radiology review: I discussed  with the patient that the lymph nodes in the bone metastases appear to be stable.  There is some airspace disease in the lungs which is concerning for progression but it appears to be fairly mild.  Because of this I recommended continuation of current treatment and follow-up with the CT of the chest in 3 months.  Monitoring blood counts Return to clinic monthly for Faslodex injections.   Orders Placed This Encounter  Procedures  . CT Chest W Contrast    Standing Status:   Future    Standing Expiration Date:   08/23/2019    Order Specific Question:   ** REASON FOR EXAM (FREE TEXT)    Answer:   Lung nodules and left hilar soft tissue density evaluation with metastatic breast cancer    Order Specific Question:   If indicated for the ordered procedure, I authorize the administration of contrast media per Radiology protocol    Answer:   Yes    Order Specific Question:   Is patient pregnant?    Answer:   No    Order Specific Question:   Preferred imaging location?    Answer:   [facility]    Order Specific Question:   Radiology Contrast Protocol - do NOT remove file path    Answer:   [email or web address]   The  patient has a good understanding of the overall plan. she agrees with it. she will call with any problems that may develop before the next visit here.  Nicholas Lose, MD 08/23/2018  Julious Oka Dorshimer am acting as scribe for Dr. Nicholas Lose.  I have reviewed the above documentation for accuracy and completeness, and I agree with the above.

## 2018-08-23 ENCOUNTER — Inpatient Hospital Stay: Payer: Self-pay

## 2018-08-23 ENCOUNTER — Other Ambulatory Visit: Payer: Self-pay

## 2018-08-23 ENCOUNTER — Inpatient Hospital Stay: Payer: Self-pay | Attending: Hematology and Oncology

## 2018-08-23 ENCOUNTER — Inpatient Hospital Stay (HOSPITAL_BASED_OUTPATIENT_CLINIC_OR_DEPARTMENT_OTHER): Payer: Self-pay | Admitting: Hematology and Oncology

## 2018-08-23 DIAGNOSIS — C7951 Secondary malignant neoplasm of bone: Secondary | ICD-10-CM | POA: Insufficient documentation

## 2018-08-23 DIAGNOSIS — C50011 Malignant neoplasm of nipple and areola, right female breast: Secondary | ICD-10-CM

## 2018-08-23 DIAGNOSIS — Z95828 Presence of other vascular implants and grafts: Secondary | ICD-10-CM

## 2018-08-23 DIAGNOSIS — Z79899 Other long term (current) drug therapy: Secondary | ICD-10-CM | POA: Insufficient documentation

## 2018-08-23 DIAGNOSIS — Z923 Personal history of irradiation: Secondary | ICD-10-CM

## 2018-08-23 DIAGNOSIS — C50012 Malignant neoplasm of nipple and areola, left female breast: Secondary | ICD-10-CM

## 2018-08-23 DIAGNOSIS — Z9221 Personal history of antineoplastic chemotherapy: Secondary | ICD-10-CM

## 2018-08-23 DIAGNOSIS — C7800 Secondary malignant neoplasm of unspecified lung: Secondary | ICD-10-CM | POA: Insufficient documentation

## 2018-08-23 DIAGNOSIS — Z9013 Acquired absence of bilateral breasts and nipples: Secondary | ICD-10-CM

## 2018-08-23 DIAGNOSIS — Z170 Estrogen receptor positive status [ER+]: Secondary | ICD-10-CM | POA: Insufficient documentation

## 2018-08-23 LAB — CBC WITH DIFFERENTIAL (CANCER CENTER ONLY)
Abs Immature Granulocytes: 0.01 10*3/uL (ref 0.00–0.07)
Basophils Absolute: 0 10*3/uL (ref 0.0–0.1)
Basophils Relative: 0 %
Eosinophils Absolute: 0 10*3/uL (ref 0.0–0.5)
Eosinophils Relative: 0 %
HCT: 33.2 % — ABNORMAL LOW (ref 36.0–46.0)
Hemoglobin: 11.3 g/dL — ABNORMAL LOW (ref 12.0–15.0)
Immature Granulocytes: 1 %
Lymphocytes Relative: 45 %
Lymphs Abs: 0.7 10*3/uL (ref 0.7–4.0)
MCH: 33.8 pg (ref 26.0–34.0)
MCHC: 34 g/dL (ref 30.0–36.0)
MCV: 99.4 fL (ref 80.0–100.0)
Monocytes Absolute: 0.1 10*3/uL (ref 0.1–1.0)
Monocytes Relative: 8 %
Neutro Abs: 0.8 10*3/uL — ABNORMAL LOW (ref 1.7–7.7)
Neutrophils Relative %: 46 %
Platelet Count: 110 10*3/uL — ABNORMAL LOW (ref 150–400)
RBC: 3.34 MIL/uL — ABNORMAL LOW (ref 3.87–5.11)
RDW: 12.7 % (ref 11.5–15.5)
WBC Count: 1.6 10*3/uL — ABNORMAL LOW (ref 4.0–10.5)
nRBC: 0 % (ref 0.0–0.2)

## 2018-08-23 LAB — CMP (CANCER CENTER ONLY)
ALT: 62 U/L — ABNORMAL HIGH (ref 0–44)
AST: 75 U/L — ABNORMAL HIGH (ref 15–41)
Albumin: 3.6 g/dL (ref 3.5–5.0)
Alkaline Phosphatase: 56 U/L (ref 38–126)
Anion gap: 11 (ref 5–15)
BUN: 9 mg/dL (ref 6–20)
CO2: 22 mmol/L (ref 22–32)
Calcium: 8.4 mg/dL — ABNORMAL LOW (ref 8.9–10.3)
Chloride: 103 mmol/L (ref 98–111)
Creatinine: 0.7 mg/dL (ref 0.44–1.00)
GFR, Est AFR Am: >60 mL/min (ref >60)
GFR, Estimated: >60 mL/min (ref >60)
Glucose, Bld: 99 mg/dL (ref 70–99)
Potassium: 3.6 mmol/L (ref 3.5–5.1)
Sodium: 136 mmol/L (ref 135–145)
Total Bilirubin: 0.4 mg/dL (ref 0.3–1.2)
Total Protein: 7.3 g/dL (ref 6.5–8.1)

## 2018-08-23 MED ORDER — FULVESTRANT 250 MG/5ML IM SOLN
INTRAMUSCULAR | Status: AC
  Filled 2018-08-23: qty 5

## 2018-08-23 MED ORDER — SODIUM CHLORIDE 0.9% FLUSH
10.0000 mL | INTRAVENOUS | Status: DC | PRN
  Administered 2018-08-23: 10 mL via INTRAVENOUS
  Filled 2018-08-23: qty 10

## 2018-08-23 MED ORDER — DENOSUMAB 120 MG/1.7ML ~~LOC~~ SOLN
SUBCUTANEOUS | Status: AC
  Filled 2018-08-23: qty 1.7

## 2018-08-23 MED ORDER — DENOSUMAB 120 MG/1.7ML ~~LOC~~ SOLN
120.0000 mg | Freq: Once | SUBCUTANEOUS | Status: AC
  Administered 2018-08-23: 13:00:00 120 mg via SUBCUTANEOUS

## 2018-08-23 MED ORDER — HEPARIN SOD (PORK) LOCK FLUSH 100 UNIT/ML IV SOLN
500.0000 [IU] | Freq: Once | INTRAVENOUS | Status: AC | PRN
  Administered 2018-08-23: 500 [IU] via INTRAVENOUS
  Filled 2018-08-23: qty 5

## 2018-08-23 MED ORDER — FULVESTRANT 250 MG/5ML IM SOLN
500.0000 mg | Freq: Once | INTRAMUSCULAR | Status: DC
  Administered 2018-08-23: 13:00:00 500 mg via INTRAMUSCULAR

## 2018-08-23 NOTE — Patient Instructions (Addendum)
Fulvestrant injection What is this medicine? FULVESTRANT (ful VES trant) blocks the effects of estrogen. It is used to treat breast cancer. This medicine may be used for other purposes; ask your health care provider or pharmacist if you have questions. COMMON BRAND NAME(S): FASLODEX What should I tell my health care provider before I take this medicine? They need to know if you have any of these conditions: -bleeding disorders -liver disease -low blood counts, like low white cell, platelet, or red cell counts -an unusual or allergic reaction to fulvestrant, other medicines, foods, dyes, or preservatives -pregnant or trying to get pregnant -breast-feeding How should I use this medicine? This medicine is for injection into a muscle. It is usually given by a health care professional in a hospital or clinic setting. Talk to your pediatrician regarding the use of this medicine in children. Special care may be needed. Overdosage: If you think you have taken too much of this medicine contact a poison control center or emergency room at once. NOTE: This medicine is only for you. Do not share this medicine with others. What if I miss a dose? It is important not to miss your dose. Call your doctor or health care professional if you are unable to keep an appointment. What may interact with this medicine? -medicines that treat or prevent blood clots like warfarin, enoxaparin, dalteparin, apixaban, dabigatran, and rivaroxaban This list may not describe all possible interactions. Give your health care provider a list of all the medicines, herbs, non-prescription drugs, or dietary supplements you use. Also tell them if you smoke, drink alcohol, or use illegal drugs. Some items may interact with your medicine. What should I watch for while using this medicine? Your condition will be monitored carefully while you are receiving this medicine. You will need important blood work done while you are taking this  medicine. Do not become pregnant while taking this medicine or for at least 1 year after stopping it. Women of child-bearing potential will need to have a negative pregnancy test before starting this medicine. Women should inform their doctor if they wish to become pregnant or think they might be pregnant. There is a potential for serious side effects to an unborn child. Men should inform their doctors if they wish to father a child. This medicine may lower sperm counts. Talk to your health care professional or pharmacist for more information. Do not breast-feed an infant while taking this medicine or for 1 year after the last dose. What side effects may I notice from receiving this medicine? Side effects that you should report to your doctor or health care professional as soon as possible: -allergic reactions like skin rash, itching or hives, swelling of the face, lips, or tongue -feeling faint or lightheaded, falls -pain, tingling, numbness, or weakness in the legs -signs and symptoms of infection like fever or chills; cough; flu-like symptoms; sore throat -vaginal bleeding Side effects that usually do not require medical attention (report to your doctor or health care professional if they continue or are bothersome): -aches, pains -constipation -diarrhea -headache -hot flashes -nausea, vomiting -pain at site where injected -stomach pain This list may not describe all possible side effects. Call your doctor for medical advice about side effects. You may report side effects to FDA at 1-800-FDA-1088. Where should I keep my medicine? This drug is given in a hospital or clinic and will not be stored at home. NOTE: This sheet is a summary. It may not cover all possible information. If you  have questions about this medicine, talk to your doctor, pharmacist, or health care provider.  2019 Elsevier/Gold Standard (2017-07-23 11:34:41) Denosumab injection What is this medicine? DENOSUMAB (den oh  sue mab) slows bone breakdown. Prolia is used to treat osteoporosis in women after menopause and in men, and in people who are taking corticosteroids for 6 months or more. Delton See is used to treat a high calcium level due to cancer and to prevent bone fractures and other bone problems caused by multiple myeloma or cancer bone metastases. Delton See is also used to treat giant cell tumor of the bone. This medicine may be used for other purposes; ask your health care provider or pharmacist if you have questions. COMMON BRAND NAME(S): Prolia, XGEVA What should I tell my health care provider before I take this medicine? They need to know if you have any of these conditions: -dental disease -having surgery or tooth extraction -infection -kidney disease -low levels of calcium or Vitamin D in the blood -malnutrition -on hemodialysis -skin conditions or sensitivity -thyroid or parathyroid disease -an unusual reaction to denosumab, other medicines, foods, dyes, or preservatives -pregnant or trying to get pregnant -breast-feeding How should I use this medicine? This medicine is for injection under the skin. It is given by a health care professional in a hospital or clinic setting. A special MedGuide will be given to you before each treatment. Be sure to read this information carefully each time. For Prolia, talk to your pediatrician regarding the use of this medicine in children. Special care may be needed. For Delton See, talk to your pediatrician regarding the use of this medicine in children. While this drug may be prescribed for children as young as 13 years for selected conditions, precautions do apply. Overdosage: If you think you have taken too much of this medicine contact a poison control center or emergency room at once. NOTE: This medicine is only for you. Do not share this medicine with others. What if I miss a dose? It is important not to miss your dose. Call your doctor or health care professional if  you are unable to keep an appointment. What may interact with this medicine? Do not take this medicine with any of the following medications: -other medicines containing denosumab This medicine may also interact with the following medications: -medicines that lower your chance of fighting infection -steroid medicines like prednisone or cortisone This list may not describe all possible interactions. Give your health care provider a list of all the medicines, herbs, non-prescription drugs, or dietary supplements you use. Also tell them if you smoke, drink alcohol, or use illegal drugs. Some items may interact with your medicine. What should I watch for while using this medicine? Visit your doctor or health care professional for regular checks on your progress. Your doctor or health care professional may order blood tests and other tests to see how you are doing. Call your doctor or health care professional for advice if you get a fever, chills or sore throat, or other symptoms of a cold or flu. Do not treat yourself. This drug may decrease your body's ability to fight infection. Try to avoid being around people who are sick. You should make sure you get enough calcium and vitamin D while you are taking this medicine, unless your doctor tells you not to. Discuss the foods you eat and the vitamins you take with your health care professional. See your dentist regularly. Brush and floss your teeth as directed. Before you have any dental work  dental work done, tell your dentist you are receiving this medicine. Do not become pregnant while taking this medicine or for 5 months after stopping it. Talk with your doctor or health care professional about your birth control options while taking this medicine. Women should inform their doctor if they wish to become pregnant or think they might be pregnant. There is a potential for serious side effects to an unborn child. Talk to your health care professional or pharmacist for  more information. What side effects may I notice from receiving this medicine? Side effects that you should report to your doctor or health care professional as soon as possible: -allergic reactions like skin rash, itching or hives, swelling of the face, lips, or tongue -bone pain -breathing problems -dizziness -jaw pain, especially after dental work -redness, blistering, peeling of the skin -signs and symptoms of infection like fever or chills; cough; sore throat; pain or trouble passing urine -signs of low calcium like fast heartbeat, muscle cramps or muscle pain; pain, tingling, numbness in the hands or feet; seizures -unusual bleeding or bruising -unusually weak or tired Side effects that usually do not require medical attention (report to your doctor or health care professional if they continue or are bothersome): -constipation -diarrhea -headache -joint pain -loss of appetite -muscle pain -runny nose -tiredness -upset stomach This list may not describe all possible side effects. Call your doctor for medical advice about side effects. You may report side effects to FDA at 1-800-FDA-1088. Where should I keep my medicine? This medicine is only given in a clinic, doctor's office, or other health care setting and will not be stored at home. NOTE: This sheet is a summary. It may not cover all possible information. If you have questions about this medicine, talk to your doctor, pharmacist, or health care provider.  2019 Elsevier/Gold Standard (2017-08-21 16:10:44)  

## 2018-08-23 NOTE — Progress Notes (Signed)
Spoke with Corie Chiquito, RN and she confirmed Delton See to be given.

## 2018-08-23 NOTE — Patient Instructions (Signed)

## 2018-09-22 ENCOUNTER — Other Ambulatory Visit: Payer: Self-pay | Admitting: Hematology and Oncology

## 2018-09-22 ENCOUNTER — Inpatient Hospital Stay (HOSPITAL_BASED_OUTPATIENT_CLINIC_OR_DEPARTMENT_OTHER): Payer: Self-pay | Admitting: Medical

## 2018-09-22 ENCOUNTER — Inpatient Hospital Stay: Payer: Self-pay

## 2018-09-22 ENCOUNTER — Other Ambulatory Visit: Payer: Self-pay

## 2018-09-22 ENCOUNTER — Inpatient Hospital Stay: Payer: Self-pay | Attending: Hematology and Oncology

## 2018-09-22 VITALS — BP 148/73 | HR 88 | Temp 98.4°F | Resp 18 | Wt 206.1 lb

## 2018-09-22 DIAGNOSIS — Z170 Estrogen receptor positive status [ER+]: Secondary | ICD-10-CM

## 2018-09-22 DIAGNOSIS — C7951 Secondary malignant neoplasm of bone: Secondary | ICD-10-CM

## 2018-09-22 DIAGNOSIS — R5383 Other fatigue: Secondary | ICD-10-CM

## 2018-09-22 DIAGNOSIS — Z9013 Acquired absence of bilateral breasts and nipples: Secondary | ICD-10-CM

## 2018-09-22 DIAGNOSIS — C50011 Malignant neoplasm of nipple and areola, right female breast: Secondary | ICD-10-CM

## 2018-09-22 DIAGNOSIS — C50012 Malignant neoplasm of nipple and areola, left female breast: Secondary | ICD-10-CM | POA: Insufficient documentation

## 2018-09-22 DIAGNOSIS — Z79899 Other long term (current) drug therapy: Secondary | ICD-10-CM | POA: Insufficient documentation

## 2018-09-22 DIAGNOSIS — C50811 Malignant neoplasm of overlapping sites of right female breast: Secondary | ICD-10-CM

## 2018-09-22 DIAGNOSIS — Z95828 Presence of other vascular implants and grafts: Secondary | ICD-10-CM

## 2018-09-22 DIAGNOSIS — Z9221 Personal history of antineoplastic chemotherapy: Secondary | ICD-10-CM

## 2018-09-22 DIAGNOSIS — J45909 Unspecified asthma, uncomplicated: Secondary | ICD-10-CM

## 2018-09-22 DIAGNOSIS — C7800 Secondary malignant neoplasm of unspecified lung: Secondary | ICD-10-CM

## 2018-09-22 DIAGNOSIS — Z923 Personal history of irradiation: Secondary | ICD-10-CM

## 2018-09-22 LAB — CBC WITH DIFFERENTIAL (CANCER CENTER ONLY)
Abs Immature Granulocytes: 0.01 10*3/uL (ref 0.00–0.07)
Basophils Absolute: 0 10*3/uL (ref 0.0–0.1)
Basophils Relative: 1 %
Eosinophils Absolute: 0.1 10*3/uL (ref 0.0–0.5)
Eosinophils Relative: 2 %
HCT: 33 % — ABNORMAL LOW (ref 36.0–46.0)
Hemoglobin: 11 g/dL — ABNORMAL LOW (ref 12.0–15.0)
Immature Granulocytes: 0 %
Lymphocytes Relative: 28 %
Lymphs Abs: 0.8 10*3/uL (ref 0.7–4.0)
MCH: 34 pg (ref 26.0–34.0)
MCHC: 33.3 g/dL (ref 30.0–36.0)
MCV: 101.9 fL — ABNORMAL HIGH (ref 80.0–100.0)
Monocytes Absolute: 0.2 10*3/uL (ref 0.1–1.0)
Monocytes Relative: 9 %
Neutro Abs: 1.7 10*3/uL (ref 1.7–7.7)
Neutrophils Relative %: 60 %
Platelet Count: 166 10*3/uL (ref 150–400)
RBC: 3.24 MIL/uL — ABNORMAL LOW (ref 3.87–5.11)
RDW: 14.5 % (ref 11.5–15.5)
WBC Count: 2.8 10*3/uL — ABNORMAL LOW (ref 4.0–10.5)
nRBC: 0 % (ref 0.0–0.2)

## 2018-09-22 LAB — CMP (CANCER CENTER ONLY)
ALT: 38 U/L (ref 0–44)
AST: 37 U/L (ref 15–41)
Albumin: 3.4 g/dL — ABNORMAL LOW (ref 3.5–5.0)
Alkaline Phosphatase: 55 U/L (ref 38–126)
Anion gap: 8 (ref 5–15)
BUN: 10 mg/dL (ref 6–20)
CO2: 24 mmol/L (ref 22–32)
Calcium: 9.2 mg/dL (ref 8.9–10.3)
Chloride: 104 mmol/L (ref 98–111)
Creatinine: 0.72 mg/dL (ref 0.44–1.00)
GFR, Est AFR Am: >60 mL/min (ref >60)
GFR, Estimated: >60 mL/min (ref >60)
Glucose, Bld: 110 mg/dL — ABNORMAL HIGH (ref 70–99)
Potassium: 4 mmol/L (ref 3.5–5.1)
Sodium: 136 mmol/L (ref 135–145)
Total Bilirubin: 0.5 mg/dL (ref 0.3–1.2)
Total Protein: 7.2 g/dL (ref 6.5–8.1)

## 2018-09-22 MED ORDER — SODIUM CHLORIDE 0.9% FLUSH
10.0000 mL | INTRAVENOUS | Status: DC | PRN
  Administered 2018-09-22: 09:00:00 10 mL via INTRAVENOUS
  Filled 2018-09-22: qty 10

## 2018-09-22 MED ORDER — ALBUTEROL SULFATE HFA 108 (90 BASE) MCG/ACT IN AERS
2.0000 | INHALATION_SPRAY | Freq: Once | RESPIRATORY_TRACT | Status: AC
  Administered 2018-09-22: 2 via RESPIRATORY_TRACT

## 2018-09-22 MED ORDER — ALBUTEROL SULFATE HFA 108 (90 BASE) MCG/ACT IN AERS
INHALATION_SPRAY | RESPIRATORY_TRACT | Status: AC
  Filled 2018-09-22: qty 6.7

## 2018-09-22 MED ORDER — DENOSUMAB 120 MG/1.7ML ~~LOC~~ SOLN
SUBCUTANEOUS | Status: AC
  Filled 2018-09-22: qty 1.7

## 2018-09-22 MED ORDER — SODIUM CHLORIDE 0.9% FLUSH
10.0000 mL | INTRAVENOUS | Status: DC | PRN
  Administered 2018-09-22: 10 mL via INTRAVENOUS
  Filled 2018-09-22: qty 10

## 2018-09-22 MED ORDER — DENOSUMAB 120 MG/1.7ML ~~LOC~~ SOLN
120.0000 mg | Freq: Once | SUBCUTANEOUS | Status: AC
  Administered 2018-09-22: 120 mg via SUBCUTANEOUS

## 2018-09-22 MED ORDER — ALBUTEROL SULFATE (2.5 MG/3ML) 0.083% IN NEBU
2.5000 mg | INHALATION_SOLUTION | Freq: Once | RESPIRATORY_TRACT | Status: DC
  Filled 2018-09-22: qty 3

## 2018-09-22 MED ORDER — FULVESTRANT 250 MG/5ML IM SOLN
500.0000 mg | Freq: Once | INTRAMUSCULAR | Status: AC
  Administered 2018-09-22: 500 mg via INTRAMUSCULAR

## 2018-09-22 MED ORDER — FULVESTRANT 250 MG/5ML IM SOLN
INTRAMUSCULAR | Status: AC
  Filled 2018-09-22: qty 5

## 2018-09-22 MED ORDER — HEPARIN SOD (PORK) LOCK FLUSH 100 UNIT/ML IV SOLN
500.0000 [IU] | Freq: Once | INTRAVENOUS | Status: AC | PRN
  Administered 2018-09-22: 500 [IU] via INTRAVENOUS
  Filled 2018-09-22: qty 5

## 2018-09-22 MED ORDER — ALBUTEROL SULFATE HFA 108 (90 BASE) MCG/ACT IN AERS: 2.0000 | INHALATION_SPRAY | RESPIRATORY_TRACT | 5 refills | Status: AC | PRN

## 2018-09-22 NOTE — Patient Instructions (Signed)
Gua de cuidados en el hogar del dispositivo de perfusin implantable  Implanted [place] Guide  Un dispositivo de perfusin implantable es un dispositivo que se coloca debajo de la piel. Por lo general, se coloca en el pecho. Puede usarse para suministrar medicamentos intravenosos, tomar muestras de sangre o realizar dilisis. Puede tener un dispositivo implantable si:   Necesita administrarse un medicamento intravenoso que podra provocar una irritacin en las venas pequeas de las manos o los brazos.   Necesita medicamentos por va intravenosa a largo plazo, como antibiticos.   Necesita recibir nutricin por va intravenosa durante un largo perodo.   Debe someterse a dilisis.  Si tiene un dispositivo, su mdico no tendr que recurrir a las venas de sus brazos para estos procedimientos. Puede tener algunas limitaciones ms al usar el dispositivo que si usara otro tipo de vas intravenosas a largo plazo, y es probable que pueda retomar sus actividades normales cuando cure la incisin.  Un dispositivo de perfusin implantable tiene dos partes principales:   Depsito. El depsito es la parte en donde se inserta la aguja para administrar los medicamentos o extraer sangre. El depsito es redondo. Luego de colocado, se ve como un rea pequea y elevada debajo de su piel.   Catter. El catter es un tubo delgado y flexible que conecta el depsito a una vena. Los medicamentos que se introducen en el depsito, pasan a travs del catter y luego llegan a la vena.  Cmo se accede al dispositivo de perfusin?  Para acceder al dispositivo:   Puede que se le coloque crema anestsica sobre la piel encima del dispositivo.   El mdico se colocar una mscara y guantes estriles.   La piel sobre el dispositivo se limpiar con cuidado con un jabn antisptico y se dejar secar.   Su mdico pellizcar suavemente el dispositivo e insertar una aguja dentro.   Su mdico revisar el retorno de sangre para garantizar que  el dispositivo est en la vena y no est obstruido.   Si el dispositivo debe tener accesibilidad para un suministro continuo de medicamentos (infusin constante), su mdico colocar un vendaje (venda) claro sobre el lugar donde se coloc la aguja. El vendaje y la aguja debern cambiarse todas las semanas o segn las indicaciones del mdico.  Qu es el purgado?  El purgado ayuda a que el dispositivo no se obstruya. Siga las indicaciones del mdico acerca de cmo y cundo purgar el dispositivo. Los dispositivos de perfusin generalmente se purgan con una solucin salina o un medicamento llamado heparina. La necesidad de purgar depender de cmo se use el dispositivo:   Si se usa solo eventualmente para suministrar medicamentos o extraer sangre, se debe purgar el dispositivo:  ? Antes y despus de la administracin de los medicamentos.  ? Antes y despus de una extraccin de sangre.  ? Como parte de una rutina de mantenimiento. Es recomendable que se purgue cada 4 o 6 semanas.   Si la infusin es constante, no necesitar limpiarlo.   Deseche las agujas y las jeringas en un contenedor para desechos destinado a objetos punzantes(recipiente para objetos punzantes). Puede comprar un recipiente para objetos punzantes en una farmacia, o puede hacer uno usted mismo con una botella de plstico rgida y un cobertor.  Durante cunto tiempo tendr implantado el dispositivo?  El dispositivo puede permanecer implantado por el tiempo que el mdico considere necesario. Cuando llega el momento de retirar el dispositivo, deber someterse a una ciruga. Esta ciruga ser similar   a la realizada en el momento de la colocacin del dispositivo.  Siga estas indicaciones en su casa:     Purgue el dispositivo como se lo haya indicado el mdico.   Si debe recibir una infusin durante varios das, siga las indicaciones del mdico acerca de cmo cuidar el lugar donde tiene colocado el dispositivo. Haga lo siguiente:  ? Lvese las manos  con agua y jabn antes de cambiar el apsito. Use desinfectante para manos con alcohol si no dispone de agua y jabn.  ? Cambie el vendaje como se lo haya indicado el mdico.  ? Coloque vendas usadas o bolsas de infusin en una bolsa plstica. Tire esa bolsa en el contenedor de basura.  ? Mantenga el vendaje que cubre la aguja limpio y seco. No deje que se moje.  ? No use tijeras u objetos punzantes cerca del tubo.  ? Mantenga el tubo sujetado, excepto cuando se use.   Controle el lugar donde tiene colocado el dispositivo todos los das para detectar signos de infeccin. Est atento a los siguientes signos:  ? Dolor, hinchazn o enrojecimiento.  ? Lquido o sangre.  ? Pus o mal olor.   Proteja la piel alrededor del lugar donde tiene colocado el dispositivo.  ? Evite usar sostenes si los breteles rozan o irritan el lugar.  ? Proteja la piel alrededor del dispositivo cuando se coloque el cinturn de seguridad. Coloque una almohadilla suave en el pecho, de ser necesario.   Dchese o bese como se lo haya indicado el mdico. Se puede mojar el lugar siempre y cuando no est recibiendo activamente una infusin.   Retome sus actividades normales como se lo haya indicado el mdico. Pregntele al mdico qu actividades son seguras para usted.   Utilice un brazalete o lleve consigo una tarjeta de alerta mdica en todo momento. Esto les permitir a los mdicos saber que tiene un dispositivo implantable en caso de una emergencia.  Solicite ayuda de inmediato si:   Tiene enrojecimiento, hinchazn o dolor en el lugar donde tiene el dispositivo.   Le sale una mayor cantidad de lquido o de sangre del lugar donde tiene el dispositivo.   Tiene pus o percibe mal olor que proviene del lugar donde tiene colocado el dispositivo.   Tiene fiebre.  Resumen   Por lo general, los dispositivos implantados se colocan en el pecho para un acceso intravenoso a largo plazo.   Siga las indicaciones del mdico sobre cmo purgar el  dispositivo y cambiar las vendas (vendajes).   Cuide el rea alrededor de donde tiene colocado el dispositivo, evite la ropa que presione el rea, y preste atencin a los signos de infeccin.   Proteja la piel alrededor del dispositivo cuando se coloque el cinturn de seguridad. Coloque una almohadilla suave en el pecho, de ser necesario.   Busque ayuda de inmediato si tiene fiebre o enrojecimiento, hinchazn, dolor, drenaje o mal olor en el lugar donde tiene el dispositivo.  Esta informacin no tiene como fin reemplazar el consejo del mdico. Asegrese de hacerle al mdico cualquier pregunta que tenga.  Document Released: 02/09/2009 Document Revised: 01/15/2017 Document Reviewed: 01/15/2017  Elsevier Interactive Patient Education  2019 Elsevier Inc.

## 2018-09-22 NOTE — Progress Notes (Signed)
Per MD Lindi Adie pt to receive faslodex and xgeva today once labs have resulted.  Pt signed xgeva consent today with translator julie present, VU of form and all information.  Verbal and written Spanish admin instructions for home use of inhaler provided by PA Lucianne Lei, teach back method used.

## 2018-09-22 NOTE — Patient Instructions (Signed)
Denosumab injection Qu es este medicamento? El DENOSUMAB desacelera la descomposicin de los [place]. Prolia se [place] para tratar la osteoporosis en mujeres despus de la menopausia y en hombres, y en personas que estn tomando corticosteroides por 6 meses o ms. [place] See se [place] para tratar el nivel elevado de calcio debido a cncer y para prevenir fracturas de huesos y otros problemas seos causados por el mieloma mltiple o la metstasis de cncer en los [place]. Xgeva tambin se [place] para tratar el tumor de clulas gigantes del hueso. Este medicamento puede ser utilizado para otros usos; si tiene alguna pregunta consulte con su proveedor de atencin mdica o con su farmacutico. MARCAS COMUNES: Prolia, XGEVA Qu le debo informar a mi profesional de la salud antes de tomar este medicamento? Necesitan saber si usted presenta alguno de los [employer] o situaciones: enfermedad dental va a realizarse una ciruga o extraccin de un diente infeccin enfermedad renal niveles bajos de calcio o vitamina D en la sangre desnutricin en hemodilisis problemas o sensibilidad de la piel enfermedad tiroidea o paratiroidea una reaccin inusual al denosumab, a otros medicamentos, alimentos, colorantes o conservantes si est embarazada o buscando quedar embarazada si est amamantando a un beb Cmo debo utilizar este medicamento? Este medicamento se administra mediante una inyeccin por va subcutnea. Lo administra un profesional de Technical sales engineer en un hospital o en un entorno clnico. Se le entregar una Gua del medicamento (MedGuide, su nombre en ingls) especial antes de cada tratamiento. Asegrese de leer esta informacin cada vez cuidadosamente. Para Prolia, hable con su pediatra para informarse acerca del uso de este medicamento en nios. Puede requerir atencin especial. Felicia [name], hable con su pediatra para informarse acerca del uso de este medicamento en nios. Aunque este medicamento se puede recetar a  nios tan pequeos como de 13 aos de edad en casos selectos, existen precauciones que deben tomarse. Sobredosis: Pngase en contacto inmediatamente con un centro toxicolgico o una sala de urgencia si usted cree que haya tomado demasiado medicamento. ATENCIN: [employer] es solo para usted. No comparta este medicamento con nadie. Qu sucede si me olvido de una dosis? Es importante no olvidar ninguna dosis. Informe a su mdico o a su profesional de la salud si no puede asistir a Photographer. Qu puede interactuar con este medicamento? No tome este medicamento con ninguno de los siguientes frmacos: otros medicamentos que contengan denosumab Esta medicina tambin puede Counselling psychologist con los siguientes medicamentos: medicamentos que disminuyen las posibilidades de Restaurant manager, fast food contra una infeccin medicamentos esteroideos, como la prednisona o la cortisona Puede ser que esta lista no menciona todas las posibles interacciones. Informe a su profesional de [employer]	[place] de [employer] productos a base de hierbas, medicamentos de [place] o suplementos nutritivos que est tomando. Si usted fuma, consume bebidas alcohlicas o si utiliza drogas ilegales, indqueselo tambin a su profesional de [employer]	[place]. Algunas sustancias pueden interactuar con su medicamento. A qu debo estar atento al usar [employer]? Visite a su mdico o a su profesional de la salud para revisar regularmente su evolucin. Su mdico o profesional de la salud podra pedirle que se realice anlisis de sangre y otras pruebas para ver cmo est. Consulte a su mdico o a su profesional de la salud si tiene fiebre, escalofros o dolor de garganta, o cualquier otro sntoma de resfro o gripe. No se trate usted mismo. Este medicamento puede reducir la capacidad del cuerpo para combatir infecciones. Trate de no acercarse a personas que estn enfermas.  Debe asegurarse de recibir suficiente calcio y vitamina D mientras toma este medicamento, a menos  que su mdico le diga que no debe [place]. Converse con su profesional de la salud sobre los alimentos que come y las vitaminas que toma. Visite peridicamente a su dentista. Cepllese los dientes y use hilo dental segn las instrucciones. Antes de someterse a Academic librarian, informe a su dentista que est [employer]. No debe quedar embarazada mientras est usando este medicamento o por 5 meses despus de dejar de usarlo. Hable con su mdico o profesional de [employer] sus opciones de mtodos anticonceptivos mientras toma este medicamento. Las mujeres deben informar a su mdico si estn buscando quedar embarazadas o si creen que podran estar embarazadas. Existe la posibilidad de efectos secundarios graves en un beb sin nacer. Para obtener ms informacin, hable con su profesional de la salud o su farmacutico. Qu efectos secundarios puedo tener al utilizar este medicamento? Efectos secundarios que debe informar a su mdico o a Barrister's clerk de la salud tan pronto como sea posible: Chief of Staff, como erupcin cutnea, comezn/picazn o urticaria, e hinchazn de la cara, los labios o la lengua dolor de huesos problemas respiratorios mareos dolor de la [place], especialmente despus de tratamiento odontolgico enrojecimiento, formacin de ampollas, descamacin de la piel signos y sntomas de infeccin, como fiebre o escalofros, tos, Social research officer, government de garganta, Social research officer, government o dificultad para orinar signos de bajo nivel de calcio, tales como ritmo cardiaco rpido, calambres musculares o dolor muscular; dolor, hormigueo, entumecimiento en las manos o pies; convulsiones sangrado o moretones inusuales cansancio o debilidad inusual Efectos secundarios que generalmente no requieren atencin mdica (infrmelos a su mdico o a Barrister's clerk de la salud si persisten o si son molestos): estreimiento diarrea dolor de Special educational needs teacher en las articulaciones prdida del apetito Marketing executive goteo de  la Lawyer cansancio Higher education careers adviser Puede ser que esta lista no menciona todos los posibles efectos secundarios. Comunquese a su mdico por asesoramiento mdico [employer] Usted puede informar los efectos secundarios a la FDA por telfono al 1-800-FDA-1088. Dnde debo guardar mi medicina? Este medicamento se administra en clnicas, consultorio del mdico u otro establecimiento de atencin mdica y no necesitar guardarlo en su domicilio. ATENCIN: Este folleto es un resumen. Puede ser que no cubra toda la posible informacin. Si usted tiene preguntas acerca de esta medicina, consulte con su mdico, su farmacutico o su profesional de Technical sales engineer.  2019 Elsevier/Gold Standard (2017-09-28 00:00:00) Fulvestrant injection Qu es este medicamento? El FULVESTRANT inhibe los efectos de la hormona estrognica. Se utiliza para tratar el cncer de mama. Este medicamento puede ser utilizado para otros usos; si tiene alguna pregunta consulte con su proveedor de atencin mdica o con su farmacutico. MARCAS COMUNES: FASLODEX Qu le debo informar a mi profesional de la salud antes de tomar este medicamento? Necesitan saber si usted presenta alguno de los siguientes problemas o situaciones: trastornos de sangrado enfermedad heptica recuentos sanguneos bajos, como baja cantidad de glbulos blancos, plaquetas o glbulos rojos una reaccin alrgica o inusual al fulvestrant, a otros medicamentos, alimentos, colorantes o conservantes si est embarazada o buscando quedar embarazada si est amamantando a un beb Cmo debo utilizar este medicamento? Este medicamento se administra mediante inyeccin por va intramuscular. Generalmente lo administra un profesional de Technical sales engineer en un hospital o en un entorno clnico. Hable con su pediatra para informarse acerca del uso de este medicamento en nios. Puede requerir atencin especial. Sobredosis: Pngase en contacto  inmediatamente con un centro  toxicolgico o una sala de urgencia si usted cree que haya tomado demasiado medicamento. ATENCIN: [employer] es solo para usted. No comparta este medicamento con nadie. Qu sucede si me olvido de una dosis? Es importante no olvidar ninguna dosis. Informe a su mdico o a su profesional de la salud si no puede asistir a Photographer. Qu puede interactuar con este medicamento? medicamentos que tratan o previenen cogulos sanguneos, tales como warfarina, enoxaparina, dalteparina, apixabn, dabigatrn y rivaroxabn Puede ser que esta lista no menciona todas las posibles interacciones. Informe a su profesional de [employer]	[place] de [employer] productos a base de hierbas, medicamentos de [place] o suplementos nutritivos que est tomando. Si usted fuma, consume bebidas alcohlicas o si utiliza drogas ilegales, indqueselo tambin a su profesional de [employer]	[place]. Algunas sustancias pueden interactuar con su medicamento. A qu debo estar atento al usar Coca[employer]? Se supervisar su estado de salud atentamente mientras reciba este medicamento. Tendr que hacerse anlisis de sangre importantes mientras est tomando este medicamento. No debe quedar embarazada mientras est tomando este medicamento o por al menos 1 ao despus de dejar de usarlo. Las mujeres con la posibilidad de Best boy nios deben tener una prueba de embarazo negativa antes de Art gallery manager a tomar este medicamento. Las mujeres deben informar a su mdico si estn buscando quedar embarazadas o si creen que estn embarazadas. Existe la posibilidad de que ocurran efectos secundarios graves a un beb sin nacer. Los hombres deben informar a su mdico si quieren tener nios. Este medicamento puede reducir el conteo de esperma. Para ms informacin hable con su profesional de la salud o su farmacutico. No debe amamantar a un beb mientras est usando este medicamento o por 1 ao despus de la ltima dosis. Qu efectos secundarios puedo tener al [employer]  este medicamento? Efectos secundarios que debe informar a su mdico o a Barrister's clerk de la salud tan pronto como sea posible: Chief of Staff, como erupcin cutnea, comezn/picazn o urticarias, hinchazn de la cara, los labios o la lengua sensacin de Youth worker o aturdimiento, Advertising copywriter, hormigueo, entumecimiento o debilidad en las piernas signos y sntomas de infeccin, como fiebre o escalofros; tos; sntomas gripales; dolor de garganta sangrado vaginal Efectos secundarios que generalmente no requieren atencin mdica (debe informarlos a su mdico o a Barrister's clerk de la salud si persisten o si son molestos): dolores estreimiento diarrea dolor de cabeza sofocos nuseas, vmito Management consultant de Biochemist, clinical Puede ser que esta lista no menciona todos los posibles efectos secundarios. Comunquese a su mdico por asesoramiento mdico [employer] Usted puede informar los efectos secundarios a la FDA por telfono al 1-800-FDA-1088. Dnde debo guardar mi medicina? Este medicamento se administra en hospitales o clnicas y no necesitar guardarlo en su domicilio. ATENCIN: Este folleto es un resumen. Puede ser que no cubra toda la posible informacin. Si usted tiene preguntas acerca de esta medicina, consulte con su mdico, su farmacutico o su profesional de Technical sales engineer.  2019 Elsevier/Gold Standard (2017-09-28 00:00:00)

## 2018-09-22 NOTE — Progress Notes (Signed)
Sympt[facility] Management Clinic Progress Note   Felicia [name] [id number] 21-Sep-1960 57 y.o.  Felicia [name] is managed by Dr. Nicholas Lose  Actively treated with chemotherapy/immunotherapy/hormonal therapy: yes  Current therapy: Faslodex, Xgeva, letrozole, and palbociclib  Last treated: 08/23/2018 (Faslodex and Delton See)  Next scheduled appointment with provider: 11/22/2018  Assessment: Plan:    Bilateral malignant neoplasm of overlapping sites of breast in female, unspecified estrogen receptor status ([place]) - Plan: albuterol (VENTOLIN HFA) 108 (90 Base) MCG/ACT inhaler 2 puff  Bone metastasis (HCC)  Malignant neoplasm metastatic to lung, unspecified laterality ([place])  Reactive airway disease with wheezing without complication, unspecified asthma severity, unspecified whether persistent - Plan: albuterol (PROVENTIL) (2.5 MG/3ML) 0.083% nebulizer solution 2.5 mg  Port catheter in place - Plan: heparin lock flush 100 unit/mL, sodium chloride flush (NS) 0.9 % injection 10 mL   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 Nicholas Lose and continues to be treated with Faslodex, Xgeva, letrozole, and  Palbociclib.  She is scheduled for an additional treatment with Faslodex and Xgeva in 1 month and will see Dr. Nicholas Lose in follow-up on 11/22/2018.  Wheezing: The patient has noted episodic wheezing and presents with wheezing on her exam today: She was given an albuterol inhaler treatment. She was also given a prescription for an inhaler and was given information in [place].  Please see After Visit Summary for patient specific instructions.  Future Appointments  Date Time Provider [place]  10/22/2018  8:15 AM [facility] None  11/22/2018  8:15 AM Nicholas Lose, MD [facility] None  11/22/2018  8:45 AM [facility] None    No orders of the  defined types were placed in this encounter.      Subjective:   Patient ID:  Felicia [name] is a 58 y.o. (DOB 23-Nov-1960) female.  Chief Complaint:  Chief Complaint  Patient presents with  . Fatigue    HPI Felicia [name] is a 58 year old female with a diagnosis of a metastatic bilateral breast cancer with bone and pulmonary metastasis. She is managed by Dr. Nicholas Lose and continues to be treated with Faslodex, Xgeva, letrozole, and  Palbociclib.  She presented to the flush room today for continued treatment with Faslodex and Xgeva.  She reports that she has felt bad since getting for last shot.  Her symptoms include fatigue and nausea.  She reports that her nausea lasted for 2 days approximately 1 week ago.  She had one episode of vomiting.  She states that these are beginning to get better now.  She has not taken any nausea medications.  She does have occasional shortness of breath and generalized weakness.  She states that she is eating and drinking well.  She occasionally notes wheezing.  Medications: I have reviewed the patient's current medications.  Allergies:  Allergies  Allergen Reactions  . No Known Allergies     Past Medical History:  Diagnosis Date  . Cancer ([place])    breast  . GERD (gastroesophageal reflux disease)     Past Surgical History:  Procedure Laterality Date  . MASTECTOMY MODIFIED RADICAL Bilateral 02/11/2016  . MASTECTOMY MODIFIED RADICAL Bilateral 02/11/2016   Procedure: BILATERAL MASTECTOMY MODIFIED RADICAL;  Surgeon: Rolm Bookbinder, MD;  Location: [facility];  Service: General;  Laterality: Bilateral;  . PORTACATH PLACEMENT N/A 09/04/2015   Procedure: INSERTION PORT-A-CATH WITH [place];  Surgeon: Rolm Bookbinder, MD;  Location: [facility];  Service: General;  Laterality: N/A;  . TUBAL LIGATION      Family History  Problem Relation Age of Onset  . Diabetes Mother     Social History   Socioeconomic  History  . Marital status: Married    Spouse name: Felicia [name]  . Number of children: 3  . Years of education: Not on file  . Highest education level: Not on file  Occupational History  . Not on file  Social Needs  . Financial resource strain: Not on file  . Food insecurity:    Worry: Not on file    Inability: Not on file  . Transportation needs:    Medical: No    Non-medical: No  Tobacco Use  . Smoking status: Never Smoker  . Smokeless tobacco: Never Used  Substance and Sexual Activity  . Alcohol use: No  . Drug use: No  . Sexual activity: Yes    Birth control/protection: Surgical  Lifestyle  . Physical activity:    Days per week: Not on file    Minutes per session: Not on file  . Stress: Not on file  Relationships  . Social connections:    Talks on phone: Not on file    Gets together: Not on file    Attends religious service: Not on file    Active member of club or organization: Not on file    Attends meetings of clubs or organizations: Not on file    Relationship status: Not on file  . Intimate partner violence:    Fear of current or ex partner: Not on file    Emotionally abused: Not on file    Physically abused: Not on file    Forced sexual activity: Not on file  Other Topics Concern  . Not on file  Social History Narrative   3 children    Felicia [name] age 67, lives in [place]    Felicia [name] age 10 lives in [place]   Felicia [name] age 72 lives in [place]    Past Medical History, Surgical history, Social history, and Family history were reviewed and updated as appropriate.   Please see review of systems for further details on the patient's review from today.   Review of Systems:  Review of Systems  Constitutional: Negative for appetite change, chills, diaphoresis and fever.  HENT: Negative for trouble swallowing.   Respiratory: Negative for cough, chest tightness and shortness of breath.   Cardiovascular: Negative for chest pain,  palpitations and leg swelling.  Gastrointestinal: Negative for abdominal distention, abdominal pain, blood in stool, constipation, diarrhea, nausea and vomiting.  Genitourinary: Negative for decreased urine volume and difficulty urinating.  Musculoskeletal: Negative for arthralgias, back pain and gait problem.  Neurological: Negative for weakness and headaches.    Objective:   Physical Exam:  BP (!) 148/73   Pulse 88   Temp 98.4 F (36.9 C) (Oral)   Resp 18   Wt 206 lb 2 oz (93.5 kg)   SpO2 98%   BMI 38.95 kg/m  ECOG: 1  Physical Exam Constitutional:      General: She is not in acute distress.    Appearance: She is not diaphoretic.  HENT:     Head: Normocephalic and atraumatic.  Eyes:     General: No scleral icterus.       Right eye: No discharge.        Left eye: No discharge.     Conjunctiva/sclera: Conjunctivae  normal.  Cardiovascular:     Rate and Rhythm: Normal rate and regular rhythm.     Heart sounds: Normal heart sounds. No murmur. No friction rub. No gallop.   Pulmonary:     Effort: Pulmonary effort is normal. No respiratory distress.     Breath sounds: Wheezing (Wheezes noted throughout the right lung field. Clearly only minimally after an Albuterol inhaler treatment.) present. No rales.  Skin:    General: Skin is warm and dry.     Findings: No erythema or rash.  Neurological:     Mental Status: She is alert.     Coordination: Coordination normal.     Gait: Gait normal.     Lab Review:     Component Value Date/Time   NA 136 09/22/2018 0929   NA 137 04/14/2017 0919   K 4.0 09/22/2018 0929   K 3.7 04/14/2017 0919   CL 104 09/22/2018 0929   CO2 24 09/22/2018 0929   CO2 23 04/14/2017 0919   GLUCOSE 110 (H) 09/22/2018 0929   GLUCOSE 100 04/14/2017 0919   BUN 10 09/22/2018 0929   BUN 12.5 04/14/2017 0919   CREATININE 0.72 09/22/2018 0929   CREATININE 0.7 04/14/2017 0919   CALCIUM 9.2 09/22/2018 0929   CALCIUM 9.5 04/14/2017 0919   PROT 7.2  09/22/2018 0929   PROT 7.8 04/14/2017 0919   ALBUMIN 3.4 (L) 09/22/2018 0929   ALBUMIN 4.2 04/14/2017 0919   AST 37 09/22/2018 0929   AST 29 04/14/2017 0919   ALT 38 09/22/2018 0929   ALT 31 04/14/2017 0919   ALKPHOS 55 09/22/2018 0929   ALKPHOS 74 04/14/2017 0919   BILITOT 0.5 09/22/2018 0929   BILITOT 0.94 04/14/2017 0919   GFRNONAA >60 09/22/2018 0929   GFRAA >60 09/22/2018 0929       Component Value Date/Time   WBC 2.8 (L) 09/22/2018 0929   WBC 3.1 (L) 09/25/2017 0835   RBC 3.24 (L) 09/22/2018 0929   HGB 11.0 (L) 09/22/2018 0929   HGB 13.8 04/14/2017 0919   HCT 33.0 (L) 09/22/2018 0929   HCT 41.0 04/14/2017 0919   PLT 166 09/22/2018 0929   PLT 211 04/14/2017 0919   MCV 101.9 (H) 09/22/2018 0929   MCV 86.6 04/14/2017 0919   MCH 34.0 09/22/2018 0929   MCHC 33.3 09/22/2018 0929   RDW 14.5 09/22/2018 0929   RDW 13.2 04/14/2017 0919   LYMPHSABS 0.8 09/22/2018 0929   LYMPHSABS 1.7 04/14/2017 0919   MONOABS 0.2 09/22/2018 0929   MONOABS 0.5 04/14/2017 0919   EOSABS 0.1 09/22/2018 0929   EOSABS 0.1 04/14/2017 0919   BASOSABS 0.0 09/22/2018 0929   BASOSABS 0.0 04/14/2017 0919   -------------------------------  Imaging from last 24 hours (if applicable):  Radiology interpretation: No results found.      The patient was seen with a Spanish interpreter today.

## 2018-09-22 NOTE — Progress Notes (Signed)
Per Dr Lindi Adie made lab appointment for patient before giving xgeva. Patient headed to lab now

## 2018-09-23 LAB — THYROID PANEL WITH TSH
Free Thyroxine Index: 2.4 (ref 1.2–4.9)
T3 Uptake Ratio: 26 % (ref 24–39)
T4, Total: 9.3 ug/dL (ref 4.5–12.0)
TSH: 2.62 u[IU]/mL (ref 0.450–4.500)

## 2018-10-19 ENCOUNTER — Other Ambulatory Visit: Payer: Self-pay | Admitting: *Deleted

## 2018-10-22 ENCOUNTER — Inpatient Hospital Stay: Payer: Self-pay

## 2018-10-22 ENCOUNTER — Inpatient Hospital Stay: Payer: Self-pay | Attending: Hematology and Oncology

## 2018-10-22 ENCOUNTER — Other Ambulatory Visit: Payer: Self-pay

## 2018-10-22 VITALS — BP 155/78 | HR 86 | Temp 98.1°F | Resp 18

## 2018-10-22 DIAGNOSIS — C50012 Malignant neoplasm of nipple and areola, left female breast: Secondary | ICD-10-CM | POA: Insufficient documentation

## 2018-10-22 DIAGNOSIS — Z923 Personal history of irradiation: Secondary | ICD-10-CM | POA: Insufficient documentation

## 2018-10-22 DIAGNOSIS — J45909 Unspecified asthma, uncomplicated: Secondary | ICD-10-CM | POA: Insufficient documentation

## 2018-10-22 DIAGNOSIS — Z79899 Other long term (current) drug therapy: Secondary | ICD-10-CM | POA: Insufficient documentation

## 2018-10-22 DIAGNOSIS — Z95828 Presence of other vascular implants and grafts: Secondary | ICD-10-CM

## 2018-10-22 DIAGNOSIS — C50011 Malignant neoplasm of nipple and areola, right female breast: Secondary | ICD-10-CM | POA: Insufficient documentation

## 2018-10-22 DIAGNOSIS — C7800 Secondary malignant neoplasm of unspecified lung: Secondary | ICD-10-CM | POA: Insufficient documentation

## 2018-10-22 DIAGNOSIS — Z9221 Personal history of antineoplastic chemotherapy: Secondary | ICD-10-CM | POA: Insufficient documentation

## 2018-10-22 DIAGNOSIS — C7951 Secondary malignant neoplasm of bone: Secondary | ICD-10-CM | POA: Insufficient documentation

## 2018-10-22 DIAGNOSIS — Z170 Estrogen receptor positive status [ER+]: Secondary | ICD-10-CM | POA: Insufficient documentation

## 2018-10-22 DIAGNOSIS — Z9013 Acquired absence of bilateral breasts and nipples: Secondary | ICD-10-CM | POA: Insufficient documentation

## 2018-10-22 LAB — CMP (CANCER CENTER ONLY)
ALT: 31 U/L (ref 0–44)
AST: 39 U/L (ref 15–41)
Albumin: 3.6 g/dL (ref 3.5–5.0)
Alkaline Phosphatase: 62 U/L (ref 38–126)
Anion gap: 10 (ref 5–15)
BUN: 12 mg/dL (ref 6–20)
CO2: 22 mmol/L (ref 22–32)
Calcium: 8.7 mg/dL — ABNORMAL LOW (ref 8.9–10.3)
Chloride: 107 mmol/L (ref 98–111)
Creatinine: 0.69 mg/dL (ref 0.44–1.00)
GFR, Est AFR Am: >60 mL/min (ref >60)
GFR, Estimated: >60 mL/min (ref >60)
Glucose, Bld: 115 mg/dL — ABNORMAL HIGH (ref 70–99)
Potassium: 3.7 mmol/L (ref 3.5–5.1)
Sodium: 139 mmol/L (ref 135–145)
Total Bilirubin: 0.5 mg/dL (ref 0.3–1.2)
Total Protein: 7.4 g/dL (ref 6.5–8.1)

## 2018-10-22 LAB — CBC WITH DIFFERENTIAL (CANCER CENTER ONLY)
Abs Immature Granulocytes: 0.01 10*3/uL (ref 0.00–0.07)
Basophils Absolute: 0 10*3/uL (ref 0.0–0.1)
Basophils Relative: 1 %
Eosinophils Absolute: 0.1 10*3/uL (ref 0.0–0.5)
Eosinophils Relative: 2 %
HCT: 31.9 % — ABNORMAL LOW (ref 36.0–46.0)
Hemoglobin: 10.9 g/dL — ABNORMAL LOW (ref 12.0–15.0)
Immature Granulocytes: 0 %
Lymphocytes Relative: 28 %
Lymphs Abs: 0.8 10*3/uL (ref 0.7–4.0)
MCH: 34.5 pg — ABNORMAL HIGH (ref 26.0–34.0)
MCHC: 34.2 g/dL (ref 30.0–36.0)
MCV: 100.9 fL — ABNORMAL HIGH (ref 80.0–100.0)
Monocytes Absolute: 0.2 10*3/uL (ref 0.1–1.0)
Monocytes Relative: 6 %
Neutro Abs: 1.8 10*3/uL (ref 1.7–7.7)
Neutrophils Relative %: 63 %
Platelet Count: 238 10*3/uL (ref 150–400)
RBC: 3.16 MIL/uL — ABNORMAL LOW (ref 3.87–5.11)
RDW: 14 % (ref 11.5–15.5)
WBC Count: 2.9 10*3/uL — ABNORMAL LOW (ref 4.0–10.5)
nRBC: 0 % (ref 0.0–0.2)

## 2018-10-22 MED ORDER — DENOSUMAB 120 MG/1.7ML ~~LOC~~ SOLN
SUBCUTANEOUS | Status: AC
  Filled 2018-10-22: qty 1.7

## 2018-10-22 MED ORDER — DENOSUMAB 120 MG/1.7ML ~~LOC~~ SOLN
120.0000 mg | Freq: Once | SUBCUTANEOUS | Status: AC
  Administered 2018-10-22: 120 mg via SUBCUTANEOUS

## 2018-10-22 MED ORDER — SODIUM CHLORIDE 0.9% FLUSH
10.0000 mL | INTRAVENOUS | Status: DC | PRN
  Administered 2018-10-22: 10 mL via INTRAVENOUS
  Filled 2018-10-22: qty 10

## 2018-10-22 MED ORDER — FULVESTRANT 250 MG/5ML IM SOLN
500.0000 mg | Freq: Once | INTRAMUSCULAR | Status: AC
  Administered 2018-10-22: 500 mg via INTRAMUSCULAR

## 2018-10-22 MED ORDER — FULVESTRANT 250 MG/5ML IM SOLN
INTRAMUSCULAR | Status: AC
  Filled 2018-10-22: qty 10

## 2018-10-22 MED ORDER — HEPARIN SOD (PORK) LOCK FLUSH 100 UNIT/ML IV SOLN
500.0000 [IU] | Freq: Once | INTRAVENOUS | Status: AC | PRN
  Administered 2018-10-22: 08:00:00 500 [IU] via INTRAVENOUS
  Filled 2018-10-22: qty 5

## 2018-11-15 ENCOUNTER — Other Ambulatory Visit: Payer: Self-pay | Admitting: Hematology and Oncology

## 2018-11-15 DIAGNOSIS — C50812 Malignant neoplasm of overlapping sites of left female breast: Secondary | ICD-10-CM

## 2018-11-15 DIAGNOSIS — C50811 Malignant neoplasm of overlapping sites of right female breast: Secondary | ICD-10-CM

## 2018-11-15 DIAGNOSIS — C7951 Secondary malignant neoplasm of bone: Secondary | ICD-10-CM

## 2018-11-15 NOTE — Assessment & Plan Note (Addendum)
Treatment Summary: 1. Neoadjuvant chemotherapy with either AC followed by THP started 09/13/2015 and completed 12 weeks of Taxol 11/29/2015, Herceptin Perjeta 6; now on Herceptin  2. Bilateral mastectomies and lymph node surgery 02/11/16 3. Adjuvant radiation 04/07/2016 to 05/15/2016 4. Adjuvant antiestrogen therapy with Letrozole started 06/26/2016 5. Neratinib after Herceptin maintenancediscontinued 07/13/2017 when she developed metastatic disease 6.Ibrance with letrozole started March 2019 stopped 04/29/2018 due to progression 7. Palliative radiation L2-4 and left pelvis completed 05/19/2018 ______________________________________________________________________________________ Current treatment: Faslodex Ibrance along with letrozole, Xgeva   Toxicities: No evidence of side effects to current treatment Back and Hip Pain: Probably musculoskeletal in nature.  Previously radiated areas appeared to be without any pain. Shortness of breath and chest discomfort and palpitations after injection that lasted 24 hours.  Monitoring and monitoring.  CT CAP 08/17/2018: Interval increase in the left infrahilar airspace disease interval development of new subpleural opacities left lung base, left lower lobe pulmonary artery appears obstructed by mass raising concerns for infarction, stable small lung nodules, bone metastases lytic lesion left acetabulum and left L3 vertebral body stable.  Lymph nodes unchanged in the left hilar and left mediastinal areas.  Possible pulmonary infarction: Could be the cause of some of her symptoms CT chest 11/19/2018:

## 2018-11-19 ENCOUNTER — Other Ambulatory Visit: Payer: Self-pay

## 2018-11-19 ENCOUNTER — Inpatient Hospital Stay: Payer: Self-pay | Attending: Hematology and Oncology

## 2018-11-19 ENCOUNTER — Ambulatory Visit (HOSPITAL_COMMUNITY)
Admission: RE | Admit: 2018-11-19 | Discharge: 2018-11-19 | Disposition: A | Discharge status: Home / Self Care | Payer: Self-pay | Source: Ambulatory Visit | Attending: Hematology and Oncology | Admitting: Hematology and Oncology

## 2018-11-19 DIAGNOSIS — C50811 Malignant neoplasm of overlapping sites of right female breast: Secondary | ICD-10-CM | POA: Insufficient documentation

## 2018-11-19 DIAGNOSIS — C7951 Secondary malignant neoplasm of bone: Secondary | ICD-10-CM | POA: Insufficient documentation

## 2018-11-19 DIAGNOSIS — Z9013 Acquired absence of bilateral breasts and nipples: Secondary | ICD-10-CM | POA: Insufficient documentation

## 2018-11-19 DIAGNOSIS — Z170 Estrogen receptor positive status [ER+]: Secondary | ICD-10-CM | POA: Insufficient documentation

## 2018-11-19 DIAGNOSIS — C50012 Malignant neoplasm of nipple and areola, left female breast: Secondary | ICD-10-CM | POA: Insufficient documentation

## 2018-11-19 DIAGNOSIS — Z79899 Other long term (current) drug therapy: Secondary | ICD-10-CM | POA: Insufficient documentation

## 2018-11-19 DIAGNOSIS — C50011 Malignant neoplasm of nipple and areola, right female breast: Secondary | ICD-10-CM | POA: Insufficient documentation

## 2018-11-19 DIAGNOSIS — Z9221 Personal history of antineoplastic chemotherapy: Secondary | ICD-10-CM | POA: Insufficient documentation

## 2018-11-19 DIAGNOSIS — C50812 Malignant neoplasm of overlapping sites of left female breast: Secondary | ICD-10-CM | POA: Insufficient documentation

## 2018-11-19 DIAGNOSIS — Z923 Personal history of irradiation: Secondary | ICD-10-CM | POA: Insufficient documentation

## 2018-11-19 DIAGNOSIS — Z79811 Long term (current) use of aromatase inhibitors: Secondary | ICD-10-CM | POA: Insufficient documentation

## 2018-11-19 DIAGNOSIS — C7800 Secondary malignant neoplasm of unspecified lung: Secondary | ICD-10-CM | POA: Insufficient documentation

## 2018-11-19 LAB — CBC WITH DIFFERENTIAL (CANCER CENTER ONLY)
Abs Immature Granulocytes: 0 10*3/uL (ref 0.00–0.07)
Basophils Absolute: 0.1 10*3/uL (ref 0.0–0.1)
Basophils Relative: 2 %
Eosinophils Absolute: 0.1 10*3/uL (ref 0.0–0.5)
Eosinophils Relative: 2 %
HCT: 34.3 % — ABNORMAL LOW (ref 36.0–46.0)
Hemoglobin: 11.7 g/dL — ABNORMAL LOW (ref 12.0–15.0)
Immature Granulocytes: 0 %
Lymphocytes Relative: 30 %
Lymphs Abs: 0.9 10*3/uL (ref 0.7–4.0)
MCH: 34.5 pg — ABNORMAL HIGH (ref 26.0–34.0)
MCHC: 34.1 g/dL (ref 30.0–36.0)
MCV: 101.2 fL — ABNORMAL HIGH (ref 80.0–100.0)
Monocytes Absolute: 0.2 10*3/uL (ref 0.1–1.0)
Monocytes Relative: 7 %
Neutro Abs: 1.7 10*3/uL (ref 1.7–7.7)
Neutrophils Relative %: 59 %
Platelet Count: 245 10*3/uL (ref 150–400)
RBC: 3.39 MIL/uL — ABNORMAL LOW (ref 3.87–5.11)
RDW: 13.9 % (ref 11.5–15.5)
WBC Count: 2.9 10*3/uL — ABNORMAL LOW (ref 4.0–10.5)
nRBC: 0 % (ref 0.0–0.2)

## 2018-11-19 LAB — CMP (CANCER CENTER ONLY)
ALT: 30 U/L (ref 0–44)
AST: 37 U/L (ref 15–41)
Albumin: 3.8 g/dL (ref 3.5–5.0)
Alkaline Phosphatase: 63 U/L (ref 38–126)
Anion gap: 8 (ref 5–15)
BUN: 12 mg/dL (ref 6–20)
CO2: 25 mmol/L (ref 22–32)
Calcium: 9.4 mg/dL (ref 8.9–10.3)
Chloride: 104 mmol/L (ref 98–111)
Creatinine: 0.73 mg/dL (ref 0.44–1.00)
GFR, Est AFR Am: >60 mL/min (ref >60)
GFR, Estimated: >60 mL/min (ref >60)
Glucose, Bld: 118 mg/dL — ABNORMAL HIGH (ref 70–99)
Potassium: 3.8 mmol/L (ref 3.5–5.1)
Sodium: 137 mmol/L (ref 135–145)
Total Bilirubin: 0.6 mg/dL (ref 0.3–1.2)
Total Protein: 7.5 g/dL (ref 6.5–8.1)

## 2018-11-19 MED ORDER — IOHEXOL 300 MG/ML  SOLN
75.0000 mL | Freq: Once | INTRAMUSCULAR | Status: AC | PRN
  Administered 2018-11-19: 09:00:00 75 mL via INTRAVENOUS

## 2018-11-19 MED ORDER — SODIUM CHLORIDE (PF) 0.9 % IJ SOLN
INTRAMUSCULAR | Status: AC
  Filled 2018-11-19: qty 50

## 2018-11-19 NOTE — Progress Notes (Addendum)
Patient Care Team: [name], MD as PCP - General (Family Medicine) Nicholas Lose, MD as Consulting Physician (Hematology and Oncology) Delice Bison, Charlestine Massed, NP as Nurse Practitioner (Hematology and Oncology) Kyung Rudd, MD as Consulting Physician (Radiation Oncology) Rolm Bookbinder, MD as Consulting Physician (General Surgery)  DIAGNOSIS:    ICD-10-CM   1. 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, estrogen receptor positive ([place])  C50.011    Z17.0    C50.012     SUMMARY OF ONCOLOGIC HISTORY: Oncology History  Bilateral breast cancer ([place])  08/16/2015 Mammogram   Left breast LIQ 4.2 cm, 4 mm oval mass 12:00, 1.2 cm mass left UOQ, enlarged left axillary lymph node   08/16/2015 Mammogram   Right breast UIQ: 3.9 cm irregular mass, 1.5 cm mass in the right LOQ, 9 mm mass right UOQ, 6 mm mass right LIQ, enlarged lymph node Rt axilla   08/20/2015 Initial Diagnosis   Right breast biopsy 1:00: IDC grade 2 ER 95%, PR 2%, Ki 67 15%; HER-2 negative ratio 1.04; 10:00: IDC grade 2-3 dissimilar to 1:00 biopsy 10:00: ER 95%, PR 0%, K67: 25%, HER-2 positive ratio 6.42 right axillary lymph node positive for metastatic cancer   08/20/2015 Procedure   Left biopsy 8:00: IDC with DCIS, grade 2; ER 95%, PR 95%, Ki-67 30%, HER-2 negative ratio 1.2; 12:00: IDC with DCIS, grade 1 ER 95%, PR 95%, Ki-67 10% HER-2 negative ratio 1.14 not similar to this biopsy, left axillary lymph node positive,    09/13/2015 - 01/17/2016 Neo-Adjuvant Chemotherapy   Neoadjuvant dose dense Adriamycin and Cytoxan followed by Taxol Herceptin Perjeta, followed by Herceptin maintenance   01/21/2016 Breast MRI   Bilateral breast masses significantly decreased in size with mild residual NME, complete resolution of numerous other nodules in both breasts, bilateral axillary lymph nodes and bilateral retropectoral lymph nodes decreased in size   02/11/2016 Surgery   Right mastectomy: IDC 2 nodules, 2.5, 1 cm,  margins negative, 3/7 lymph nodes positive T2 N1a stage II B, ER 95%, PR 2%, HER-2 negative, Ki-67 15% RCB burden 3.1 and 2.9 class II   02/11/2016 Surgery   Left mastectomy: IDC 3.1 cm with DCIS, margins negative, 0/8 lymph nodes, T2 N0 stage II a, RCB burden 2.18, class II, ER 100%, PR 95%, HER-2 negative ratio 1.2, Ki-67 30%    04/07/2016 - 05/15/2016 Radiation Therapy   Adjuvant radiation   06/26/2016 - 07/13/2017 Anti-estrogen oral therapy   Letrozole 2.5 mg daily   11/20/2016 Miscellaneous   Neratinib 240 mg daily discontinued 07/13/2017 when she presented with metastatic disease to the lungs   07/13/2017 Relapse/Recurrence   Progression of right and left hilar lymph nodes and bilateral lung nodules   07/13/2017 -  Anti-estrogen oral therapy   Ibrance with letrozole   10/24/2017 Imaging   CT chest abdomen pelvis: Interval decrease in the left hilar node.  The more superior left hilar node is slightly increased in size.  Interval decrease in the size of multiple lung nodules   04/26/2018 Imaging   Interval development of multiple lytic bone metastases.  Lesion in the left acetabulum measures 5.5 cm, new lesion L3 extending to the left pedicle.  Perihilar nodule left lower lobe increased in size 2.8 cm from 1.8 cm, other nodules not changed.  Similar appearance of left hilar adenopathy, small perinephric soft tissue nodule increased in size   05/07/2018 - 05/19/2018 Radiation Therapy   Radiation to L3 (30 Gy in 10 Fx)  CHIEF COMPLIANT: Follow-up of metastatic breast cancer on Ibrance with letrozole to review recent scan  INTERVAL HISTORY: Felicia [name] is a 58 y.o. with above-mentioned history of metastatic breast cancer currently on Ibrance with letrozole and Faslodex and Xgeva. On 09/22/18 she was seen in symptom management for wheezing and was started on an albuterol inhaler. Chest CT on 11/19/18 showed progression, with a new left lower lobe nodule and enlarging left  pleural effusion. Bilateral pulmonary nodules and lymph nodes were stable. She presents to the clinic today to review her recent scan.   REVIEW OF SYSTEMS:   Constitutional: Denies fevers, chills or abnormal weight loss Eyes: Denies blurriness of vision Ears, nose, mouth, throat, and face: Denies mucositis or sore throat Respiratory: Denies cough, dyspnea or wheezes Cardiovascular: Denies palpitation, chest discomfort Gastrointestinal: Denies nausea, heartburn or change in bowel habits Skin: Denies abnormal skin rashes Lymphatics: Denies new lymphadenopathy or easy bruising Neurological: Denies numbness, tingling or new weaknesses Behavioral/Psych: Mood is stable, no new changes  Extremities: No lower extremity edema Breast: denies any pain or lumps or nodules in either breasts All other systems were reviewed with the patient and are negative.  I have reviewed the past medical history, past surgical history, social history and family history with the patient and they are unchanged from previous note.  ALLERGIES:  is allergic to no known allergies.  MEDICATIONS:  Current Outpatient Medications  Medication Sig Dispense Refill   albuterol (VENTOLIN HFA) 108 (90 Base) MCG/ACT inhaler Inhale 2 puffs into the lungs every 4 (four) hours as needed for wheezing or shortness of breath. 1 Inhaler 5   Calcium Carb-Cholecalciferol (CALCIUM 600+D) 600-800 MG-UNIT TABS Take 1 tablet by mouth daily.     Cholecalciferol (VITAMIN D-3) 5000 units TABS Take 1 tablet by mouth daily.     letrozole (FEMARA) 2.5 MG tablet Take 1 tablet (2.5 mg total) by mouth daily. 90 tablet 3   lidocaine-prilocaine (EMLA) cream Apply 1 application topically as needed (Apply 2- hours prior to access of port). 30 g 6   lisinopril (PRINIVIL,ZESTRIL) 20 MG tablet Take 1 tablet (20 mg total) by mouth daily. 30 tablet 6   palbociclib (IBRANCE) 125 MG capsule Take 1 capsule (125 mg total) by mouth daily with breakfast. Take  for 21 days on, 7 days off, repeated every 28 days. Take whole with food. 21 capsule 6   pantoprazole (PROTONIX) 40 MG tablet TAKE 1 TABLET BY MOUTH ONCE A DAY 30 tablet 0   potassium chloride (K-DUR,KLOR-CON) 10 MEQ tablet TAKE 2 TABLETS (20 MEQ TOTAL) BY MOUTH 2 TIMES DAILY. 120 tablet 2   No current facility-administered medications for this visit.    Facility-Administered Medications Ordered in Other Visits  Medication Dose Route Frequency Provider Last Rate Last Dose   sodium chloride flush (NS) 0.9 % injection 10 mL  10 mL Intravenous PRN Nicholas Lose, MD   10 mL at 04/14/17 0831    PHYSICAL EXAMINATION: ECOG PERFORMANCE STATUS: 1 - Symptomatic but completely ambulatory  Vitals:   11/22/18 0831  BP: (!) 166/81  Pulse: 80  Resp: 18  Temp: 98.2 F (36.8 C)  SpO2: 90%   Filed Weights   11/22/18 0831  Weight: 205 lb 1.6 oz (93 kg)    GENERAL: alert, no distress and comfortable SKIN: skin color, texture, turgor are normal, no rashes or significant lesions EYES: normal, Conjunctiva are pink and non-injected, sclera clear OROPHARYNX: no exudate, no erythema and lips, buccal mucosa, and tongue  normal  NECK: supple, thyroid normal size, non-tender, without nodularity LYMPH: no palpable lymphadenopathy in the cervical, axillary or inguinal LUNGS: clear to auscultation and percussion with normal breathing effort HEART: regular rate & rhythm and no murmurs and no lower extremity edema ABDOMEN: abdomen soft, non-tender and normal bowel sounds MUSCULOSKELETAL: no cyanosis of digits and no clubbing  NEURO: alert & oriented x 3 with fluent speech, no focal motor/sensory deficits EXTREMITIES: No lower extremity edema  LABORATORY DATA:  I have reviewed the data as listed CMP Latest Ref Rng & Units 11/19/2018 10/22/2018 09/22/2018  Glucose 70 - 99 mg/dL 118(H) 115(H) 110(H)  BUN 6 - 20 mg/dL '12 12 10  '$ Creatinine 0.44 - 1.00 mg/dL 0.73 0.69 0.72  Sodium 135 - 145 mmol/L 137 139 136    Potassium 3.5 - 5.1 mmol/L 3.8 3.7 4.0  Chloride 98 - 111 mmol/L 104 107 104  CO2 22 - 32 mmol/L '25 22 24  '$ Calcium 8.9 - 10.3 mg/dL 9.4 8.7(L) 9.2  Total Protein 6.5 - 8.1 g/dL 7.5 7.4 7.2  Total Bilirubin 0.3 - 1.2 mg/dL 0.6 0.5 0.5  Alkaline Phos 38 - 126 U/L 63 62 55  AST 15 - 41 U/L 37 39 37  ALT 0 - 44 U/L 30 31 38    Lab Results  Component Value Date   WBC 2.9 (L) 11/19/2018   HGB 11.7 (L) 11/19/2018   HCT 34.3 (L) 11/19/2018   MCV 101.2 (H) 11/19/2018   PLT 245 11/19/2018   NEUTROABS 1.7 11/19/2018    ASSESSMENT & PLAN:  Bilateral breast cancer ([place]) Treatment Summary: 1. Neoadjuvant chemotherapy with either AC followed by THP started 09/13/2015 and completed 12 weeks of Taxol 11/29/2015, Herceptin Perjeta 6; now on Herceptin  2. Bilateral mastectomies and lymph node surgery 02/11/16 3. Adjuvant radiation 04/07/2016 to 05/15/2016 4. Adjuvant antiestrogen therapy with Letrozole started 06/26/2016 5. Neratinib after Herceptin maintenancediscontinued 07/13/2017 when she developed metastatic disease 6.Ibrance with letrozole started March 2019 stopped 04/29/2018 due to progression 7. Palliative radiation L2-4 and left pelvis completed 05/19/2018  [facility] molecular test: ER positive, PR positive, PI K3CA mutation present, HER-2 negative ER positive, TMB high 19 mutations, PDL 1 -0% ______________________________________________________________________________________ Current treatment: Faslodex Ibrance along with letrozole, Xgeva   Toxicities: No evidence of side effects to current treatment Back and Hip Pain: Probably musculoskeletal in nature.  Previously radiated areas appeared to be without any pain. Shortness of breath and chest discomfort and palpitations after injection that lasted 24 hours.  Monitoring and monitoring.  CT CAP 08/17/2018: Interval increase in the left infrahilar airspace disease interval development of new subpleural opacities left lung base, left  lower lobe pulmonary artery appears obstructed by mass raising concerns for infarction, stable small lung nodules, bone metastases lytic lesion left acetabulum and left L3 vertebral body stable.  Lymph nodes unchanged in the left hilar and left mediastinal areas.  Possible pulmonary infarction: Could be the cause of some of her symptoms CT chest 11/19/2018: Interval progression of metastatic disease, new nodule left lower lobe 2.2 cm, enlarging left pleural effusion.  Additional bilateral lung nodules and left hilar nodes are grossly stable.  New diffuse peribronchovascular and subpleural groundglass may be due to drug toxicity.  I discussed with the patient the results of the scan. Because of progression of disease we would like to obtain the pleural fluid to test for breast prognostic panel and determine if anti-HER-2 therapy may be a better option for her.  Return to clinic after  thoracentesis/ lung nodule biopsy to discuss the pathology report Treatment options include 1.  Faslodex with Alpelisib 2.  Immunotherapy  If the biopsy is HER-2 positive then we would consider Kadcyla. I instructed her to stop Ibrance today. She does not need Xgeva every month.  We will switch her to every 3 months.    No orders of the defined types were placed in this encounter.  The patient has a good understanding of the overall plan. she agrees with it. she will call with any problems that may develop before the next visit here.  Nicholas Lose, MD 11/22/2018  Julious Oka Dorshimer am acting as scribe for Dr. Nicholas Lose.  I have reviewed the above documentation for accuracy and completeness, and I agree with the above.   Addendum: After discussion with interventional radiology, we decided to do a PET CT scan first before pursuing a biopsy.

## 2018-11-22 ENCOUNTER — Inpatient Hospital Stay (HOSPITAL_BASED_OUTPATIENT_CLINIC_OR_DEPARTMENT_OTHER): Payer: Self-pay | Admitting: Hematology and Oncology

## 2018-11-22 ENCOUNTER — Inpatient Hospital Stay: Payer: Self-pay

## 2018-11-22 ENCOUNTER — Ambulatory Visit: Payer: Self-pay

## 2018-11-22 ENCOUNTER — Other Ambulatory Visit: Payer: Self-pay

## 2018-11-22 ENCOUNTER — Ambulatory Visit (HOSPITAL_COMMUNITY): Payer: Self-pay

## 2018-11-22 DIAGNOSIS — Z79811 Long term (current) use of aromatase inhibitors: Secondary | ICD-10-CM

## 2018-11-22 DIAGNOSIS — C50012 Malignant neoplasm of nipple and areola, left female breast: Secondary | ICD-10-CM

## 2018-11-22 DIAGNOSIS — Z79899 Other long term (current) drug therapy: Secondary | ICD-10-CM

## 2018-11-22 DIAGNOSIS — Z95828 Presence of other vascular implants and grafts: Secondary | ICD-10-CM

## 2018-11-22 DIAGNOSIS — C7800 Secondary malignant neoplasm of unspecified lung: Secondary | ICD-10-CM

## 2018-11-22 DIAGNOSIS — Z170 Estrogen receptor positive status [ER+]: Secondary | ICD-10-CM

## 2018-11-22 DIAGNOSIS — Z9013 Acquired absence of bilateral breasts and nipples: Secondary | ICD-10-CM

## 2018-11-22 DIAGNOSIS — C7951 Secondary malignant neoplasm of bone: Secondary | ICD-10-CM

## 2018-11-22 DIAGNOSIS — Z9221 Personal history of antineoplastic chemotherapy: Secondary | ICD-10-CM

## 2018-11-22 DIAGNOSIS — Z923 Personal history of irradiation: Secondary | ICD-10-CM

## 2018-11-22 DIAGNOSIS — C50011 Malignant neoplasm of nipple and areola, right female breast: Secondary | ICD-10-CM

## 2018-11-22 MED ORDER — FULVESTRANT 250 MG/5ML IM SOLN
500.0000 mg | Freq: Once | INTRAMUSCULAR | Status: AC
  Administered 2018-11-22: 500 mg via INTRAMUSCULAR

## 2018-11-22 MED ORDER — FULVESTRANT 250 MG/5ML IM SOLN
INTRAMUSCULAR | Status: AC
  Filled 2018-11-22: qty 5

## 2018-11-22 NOTE — Patient Instructions (Signed)
Fulvestrant injection What is this medicine? FULVESTRANT (ful VES trant) blocks the effects of estrogen. It is used to treat breast cancer. This medicine may be used for other purposes; ask your health care provider or pharmacist if you have questions. COMMON BRAND NAME(S): FASLODEX What should I tell my health care provider before I take this medicine? They need to know if you have any of these conditions:  bleeding disorders  liver disease  low blood counts, like low white cell, platelet, or red cell counts  an unusual or allergic reaction to fulvestrant, other medicines, foods, dyes, or preservatives  pregnant or trying to get pregnant  breast-feeding How should I use this medicine? This medicine is for injection into a muscle. It is usually given by a health care professional in a hospital or clinic setting. Talk to your pediatrician regarding the use of this medicine in children. Special care may be needed. Overdosage: If you think you have taken too much of this medicine contact a poison control center or emergency room at once. NOTE: This medicine is only for you. Do not share this medicine with others. What if I miss a dose? It is important not to miss your dose. Call your doctor or health care professional if you are unable to keep an appointment. What may interact with this medicine?  medicines that treat or prevent blood clots like warfarin, enoxaparin, dalteparin, apixaban, dabigatran, and rivaroxaban This list may not describe all possible interactions. Give your health care provider a list of all the medicines, herbs, non-prescription drugs, or dietary supplements you use. Also tell them if you smoke, drink alcohol, or use illegal drugs. Some items may interact with your medicine. What should I watch for while using this medicine? Your condition will be monitored carefully while you are receiving this medicine. You will need important blood work done while you are taking  this medicine. Do not become pregnant while taking this medicine or for at least 1 year after stopping it. Women of child-bearing potential will need to have a negative pregnancy test before starting this medicine. Women should inform their doctor if they wish to become pregnant or think they might be pregnant. There is a potential for serious side effects to an unborn child. Men should inform their doctors if they wish to father a child. This medicine may lower sperm counts. Talk to your health care professional or pharmacist for more information. Do not breast-feed an infant while taking this medicine or for 1 year after the last dose. What side effects may I notice from receiving this medicine? Side effects that you should report to your doctor or health care professional as soon as possible:  allergic reactions like skin rash, itching or hives, swelling of the face, lips, or tongue  feeling faint or lightheaded, falls  pain, tingling, numbness, or weakness in the legs  signs and symptoms of infection like fever or chills; cough; flu-like symptoms; sore throat  vaginal bleeding Side effects that usually do not require medical attention (report to your doctor or health care professional if they continue or are bothersome):  aches, pains  constipation  diarrhea  headache  hot flashes  nausea, vomiting  pain at site where injected  stomach pain This list may not describe all possible side effects. Call your doctor for medical advice about side effects. You may report side effects to FDA at 1-800-FDA-1088. Where should I keep my medicine? This drug is given in a hospital or clinic and will   not be stored at home. NOTE: This sheet is a summary. It may not cover all possible information. If you have questions about this medicine, talk to your doctor, pharmacist, or health care provider.  2020 Elsevier/Gold Standard (2017-07-23 11:34:41)  

## 2018-11-22 NOTE — Addendum Note (Signed)
Addended by: Nicholas Lose on: 11/22/2018 03:08 PM   Modules accepted: Orders

## 2018-12-01 ENCOUNTER — Encounter (HOSPITAL_COMMUNITY)
Admission: RE | Admit: 2018-12-01 | Discharge: 2018-12-01 | Disposition: A | Discharge status: Home / Self Care | Payer: Self-pay | Source: Ambulatory Visit | Attending: Hematology and Oncology | Admitting: Hematology and Oncology

## 2018-12-01 ENCOUNTER — Other Ambulatory Visit: Payer: Self-pay

## 2018-12-01 ENCOUNTER — Telehealth: Payer: Self-pay | Admitting: Pulmonary Disease

## 2018-12-01 DIAGNOSIS — C50011 Malignant neoplasm of nipple and areola, right female breast: Secondary | ICD-10-CM | POA: Insufficient documentation

## 2018-12-01 DIAGNOSIS — C50012 Malignant neoplasm of nipple and areola, left female breast: Secondary | ICD-10-CM | POA: Insufficient documentation

## 2018-12-01 DIAGNOSIS — Z170 Estrogen receptor positive status [ER+]: Secondary | ICD-10-CM | POA: Insufficient documentation

## 2018-12-01 LAB — GLUCOSE, CAPILLARY: Glucose-Capillary: 116 mg/dL — ABNORMAL HIGH (ref 70–99)

## 2018-12-01 MED ORDER — FLUDEOXYGLUCOSE F - 18 (FDG) INJECTION
10.2200 | Freq: Once | INTRAVENOUS | Status: AC | PRN
  Administered 2018-12-01: 11:00:00 10.22 via INTRAVENOUS

## 2018-12-01 NOTE — Telephone Encounter (Signed)
Dr Lindi Adie from oncology wanted me to see her in consultation for a bronchoscopy. Can you please arrange OV with me on Friday 1-30 pm ?- will need spanish interpreter.

## 2018-12-01 NOTE — Telephone Encounter (Signed)
Called patient via [employer], she did not answer. Left a message via the interpreter to call back. Will call her back in the morning to get her scheduled.

## 2018-12-02 NOTE — Telephone Encounter (Signed)
Left messages for both the patient and her daughter listed on the DPR. Will route to RA to let him know.

## 2018-12-07 NOTE — Telephone Encounter (Signed)
I am available to see her this week if she can come in

## 2018-12-10 NOTE — Telephone Encounter (Signed)
I called the numbers and she was not available. I couldn't leave message due to no mailbox being set up.

## 2018-12-10 NOTE — Telephone Encounter (Signed)
Pt calling back to set up appt w/RA and can be reached @ [phone].Felicia [name]

## 2018-12-21 ENCOUNTER — Telehealth: Payer: Self-pay | Admitting: Hematology and Oncology

## 2018-12-21 ENCOUNTER — Other Ambulatory Visit: Payer: Self-pay | Admitting: *Deleted

## 2018-12-21 DIAGNOSIS — Z170 Estrogen receptor positive status [ER+]: Secondary | ICD-10-CM

## 2018-12-21 DIAGNOSIS — C50011 Malignant neoplasm of nipple and areola, right female breast: Secondary | ICD-10-CM

## 2018-12-21 NOTE — Telephone Encounter (Signed)
Unable to reach pt per 8/24 sch message - left message for patient to call back to schedule appt for inj . appt scheduled just in case pt is my chart active.   Let RN know.

## 2018-12-22 ENCOUNTER — Telehealth: Payer: Self-pay | Admitting: Pulmonary Disease

## 2018-12-22 ENCOUNTER — Other Ambulatory Visit: Payer: Self-pay | Admitting: Pulmonary Disease

## 2018-12-22 ENCOUNTER — Other Ambulatory Visit: Payer: Self-pay

## 2018-12-22 ENCOUNTER — Encounter: Payer: Self-pay | Admitting: Pulmonary Disease

## 2018-12-22 ENCOUNTER — Ambulatory Visit (INDEPENDENT_AMBULATORY_CARE_PROVIDER_SITE_OTHER): Payer: Self-pay | Admitting: Pulmonary Disease

## 2018-12-22 DIAGNOSIS — C7800 Secondary malignant neoplasm of unspecified lung: Secondary | ICD-10-CM

## 2018-12-22 NOTE — H&P (View-Only) (Signed)
Subjective:    Patient ID: Felicia [name], female    DOB: 09-Nov-1960, 58 y.o.   MRN: [id number]  HPI   58 year old never smoker referred by oncology for evaluation of abnormal imaging studies She has a history of bilateral ER positive breast cancer which was diagnosed in 07/2015 when she presented with bilateral breast masses, underwent neoadjuvant chemotherapy followed by bilateral mastectomy in 01/2016, she had lymph nodes +3/7 on the right and this was stage IIb, on the left was stage IIa with negative lymph nodes.  She underwent adjuvant radiation followed by antiestrogen oral therapy with letrozole.  In 10/2016 she was started on neratinib until 06/2017 found to have metastatic disease to the lungs in the form of bilateral hilar lymphadenopathy and lung nodules. Ibrance and letrozole was started.  In 04/26/2018 she was found to have bony metastases and she underwent radiation in 04/2018  Chest CT 11/19/2018 showed progression with a new left lower lobe nodule and enlarging left pleural effusion.  Bilateral lymph adenopathy and pulmonary nodules were stable   Currently she is on Faslodex, Ibrance along with letrozole and Xgeva  She reports mild shortness of breath,  left pleuritic chest pain, occasional wheezing She reports cough with clear sputum production and denies fevers  Significant tests/ events reviewed   CT CAP 08/17/2018: Interval increase in the left infrahilar airspace disease interval development of new subpleural opacities left lung base, left lower lobe pulmonary artery appears obstructed by mass raising concerns for infarction, stable small lung nodules, bone metastases lytic lesion left acetabulum and left L3 vertebral body stable. Lymph nodes unchanged in the left hilar and left mediastinal areas.  Possible pulmonary infarction: Could be the cause of some of her symptoms CT chest 11/19/2018: Interval progression of metastatic disease, new nodule left lower  lobe 2.2 cm, enlarging left pleural effusion.  Additional bilateral lung nodules and left hilar nodes are grossly stable.  New diffuse peribronchovascular and subpleural groundglass may be due to drug toxicity.   PET 8/5  Central left lower lobe lung mass with postobstructive pneumonitis is hypermetabolic and concerning for either metastatic disease or primary bronchogenic carcinoma. 2. Hypermetabolic left hilar and ipsilateral mediastinal and subcarinal lymph nodes compatible with metastatic adenopathy. 3. Multifocal hypermetabolic bone lesions. FDG avid lytic lesions involving the posterior column of left acetabulum and lesser trochanter of the right femur may be of pending orthopedic significance. 4. FDG avid nodule closely associated with the posterior aspect of the right lobe of thyroid gland     Past Medical History:  Diagnosis Date  . Asthma   . Cancer (HCC)    breast  . GERD (gastroesophageal reflux disease)   . Hypertension   . Pulmonary nodule     Past Surgical History:  Procedure Laterality Date  . MASTECTOMY MODIFIED RADICAL Bilateral 02/11/2016  . MASTECTOMY MODIFIED RADICAL Bilateral 02/11/2016   Procedure: BILATERAL MASTECTOMY MODIFIED RADICAL;  Surgeon: Rolm Bookbinder, MD;  Location: [place];  Service: General;  Laterality: Bilateral;  . PORTACATH PLACEMENT N/A 09/04/2015   Procedure: INSERTION PORT-A-CATH WITH [place];  Surgeon: Rolm Bookbinder, MD;  Location: [place];  Service: General;  Laterality: N/A;  . TUBAL LIGATION      Relationships  Social connections  . Talks on phone: Not on file  . Gets together: Not on file  . Attends religious service: Not on file  . Active member of club or organization: Not on file  . Attends meetings of clubs or organizations: Not  on file  . Relationship status: Not on file     Family History  Problem Relation [name] of Onset  . Diabetes Mother     Review of Systems Positive as above  Constitutional:  negative for anorexia, fevers and sweats  Eyes: negative for irritation, redness and visual disturbance  Ears, nose, mouth, throat, and face: negative for earaches, epistaxis, nasal congestion and sore throat  Respiratory: negative for hemoptysis Cardiovascular: negative for, lower extremity edema, orthopnea, palpitations and syncope  Gastrointestinal: negative for abdominal pain, constipation, diarrhea, melena, nausea and vomiting  Genitourinary:negative for dysuria, frequency and hematuria  Hematologic/lymphatic: negative for bleeding, easy bruising and lymphadenopathy  Musculoskeletal:negative for arthralgias, muscle weakness and stiff joints  Neurological: negative for coordination problems, gait problems, headaches and weakness  Endocrine: negative for diabetic symptoms including polydipsia, polyuria and weight loss     Objective:   Physical Exam  Gen. Pleasant, obese, in no distress, normal affect ENT - no pallor,icterus, no post nasal drip, class 2-3 airway Neck: No JVD, no thyromegaly, no carotid bruits Lungs: no use of accessory muscles, no dullness to percussion, decreased without rales or rhonchi  Cardiovascular: Rhythm regular, heart sounds  normal, no murmurs or gallops, no peripheral edema Abdomen: soft and non-tender, no hepatosplenomegaly, BS normal. Musculoskeletal: No deformities, no cyanosis or clubbing Neuro:  alert, non focal, no tremors       Assessment & Plan:

## 2018-12-22 NOTE — Telephone Encounter (Signed)
error 

## 2018-12-22 NOTE — Patient Instructions (Signed)
Bronchoscopy arranged for Tuesday 9/8 at 7:30 AM NPO after midnight for procedure - you will need someone to drive you back home We discussed risks and benefits of procedure

## 2018-12-22 NOTE — Progress Notes (Signed)
   Subjective:    Patient ID: Felicia [name], female    DOB: [birth year], 59 y.o.   MRN: [id number]  HPI    Review of Systems  Constitutional: Negative for fever and unexpected weight change.  HENT: Negative for congestion, dental problem, ear pain, nosebleeds, postnasal drip, rhinorrhea, sinus pressure, sneezing, sore throat and trouble swallowing.   Eyes: Negative for redness and itching.  Respiratory: Positive for cough and chest tightness. Negative for shortness of breath and wheezing.   Cardiovascular: Negative for palpitations and leg swelling.  Gastrointestinal: Negative for nausea and vomiting.  Genitourinary: Negative for dysuria.  Musculoskeletal: Negative for joint swelling.  Skin: Negative for rash.  Allergic/Immunologic: Negative.  Negative for environmental allergies, food allergies and immunocompromised state.  Neurological: Negative for headaches.  Hematological: Does not bruise/bleed easily.  Psychiatric/Behavioral: Negative for dysphoric mood. The patient is not nervous/anxious.        Objective:   Physical Exam        Assessment & Plan:

## 2018-12-22 NOTE — Assessment & Plan Note (Addendum)
Unfortunately she has had disease progression.  I reviewed her CT and PET scan personally, pleural effusion is very tiny.  She does seem to have endobronchial lesion in the left lower lobe which would not be amenable to simple bronchoscopic biopsy.  She certainly has lymphadenopathy which would be amenable to EBUS under general anesthesia.  We feel that it would be logistically easier to proceed with a simple bronchoscopy under conscious sedation and get tissue for characterization which would help the oncologist decide on further avenues of treatment. Discussed risks and benefits of procedure with her and she is willing to proceed. Bronchoscopy was scheduled for September 8 , she will need COVID testing prior to this  I discussed all these issues with the patient with the assistance of an interpreter  She can use albuterol as needed, since she does not have colored sputum we will hold off on antibiotics but will obtain cultures with bronchoscopy to rule out postobstructive pneumonia

## 2018-12-22 NOTE — Progress Notes (Signed)
Subjective:    Patient ID: Felicia [name], female    DOB: 10-May-1960, 58 y.o.   MRN: [id number]  HPI   58 year old never smoker referred by oncology for evaluation of abnormal imaging studies She has a history of bilateral ER positive breast cancer which was diagnosed in 07/2015 when she presented with bilateral breast masses, underwent neoadjuvant chemotherapy followed by bilateral mastectomy in 01/2016, she had lymph nodes +3/7 on the right and this was stage IIb, on the left was stage IIa with negative lymph nodes.  She underwent adjuvant radiation followed by antiestrogen oral therapy with letrozole.  In 10/2016 she was started on neratinib until 06/2017 found to have metastatic disease to the lungs in the form of bilateral hilar lymphadenopathy and lung nodules. Ibrance and letrozole was started.  In 04/26/2018 she was found to have bony metastases and she underwent radiation in 04/2018  Chest CT 11/19/2018 showed progression with a new left lower lobe nodule and enlarging left pleural effusion.  Bilateral lymph adenopathy and pulmonary nodules were stable   Currently she is on Faslodex, Ibrance along with letrozole and Xgeva  She reports mild shortness of breath,  left pleuritic chest pain, occasional wheezing She reports cough with clear sputum production and denies fevers  Significant tests/ events reviewed   CT CAP 08/17/2018: Interval increase in the left infrahilar airspace disease interval development of new subpleural opacities left lung base, left lower lobe pulmonary artery appears obstructed by mass raising concerns for infarction, stable small lung nodules, bone metastases lytic lesion left acetabulum and left L3 vertebral body stable. Lymph nodes unchanged in the left hilar and left mediastinal areas.  Possible pulmonary infarction: Could be the cause of some of her symptoms CT chest 11/19/2018: Interval progression of metastatic disease, new nodule left lower  lobe 2.2 cm, enlarging left pleural effusion.  Additional bilateral lung nodules and left hilar nodes are grossly stable.  New diffuse peribronchovascular and subpleural groundglass may be due to drug toxicity.   PET 8/5  Central left lower lobe lung mass with postobstructive pneumonitis is hypermetabolic and concerning for either metastatic disease or primary bronchogenic carcinoma. 2. Hypermetabolic left hilar and ipsilateral mediastinal and subcarinal lymph nodes compatible with metastatic adenopathy. 3. Multifocal hypermetabolic bone lesions. FDG avid lytic lesions involving the posterior column of left acetabulum and lesser trochanter of the right femur may be of pending orthopedic significance. 4. FDG avid nodule closely associated with the posterior aspect of the right lobe of thyroid gland     Past Medical History:  Diagnosis Date  . Asthma   . Cancer (HCC)    breast  . GERD (gastroesophageal reflux disease)   . Hypertension   . Pulmonary nodule     Past Surgical History:  Procedure Laterality Date  . MASTECTOMY MODIFIED RADICAL Bilateral 02/11/2016  . MASTECTOMY MODIFIED RADICAL Bilateral 02/11/2016   Procedure: BILATERAL MASTECTOMY MODIFIED RADICAL;  Surgeon: Rolm Bookbinder, MD;  Location: [place];  Service: General;  Laterality: Bilateral;  . PORTACATH PLACEMENT N/A 09/04/2015   Procedure: INSERTION PORT-A-CATH WITH [place];  Surgeon: Rolm Bookbinder, MD;  Location: [place];  Service: General;  Laterality: N/A;  . TUBAL LIGATION      Relationships  Social connections  . Talks on phone: Not on file  . Gets together: Not on file  . Attends religious service: Not on file  . Active member of club or organization: Not on file  . Attends meetings of clubs or organizations: Not  on file  . Relationship status: Not on file     Family History  Problem Relation [name] of Onset  . Diabetes Mother     Review of Systems Positive as above  Constitutional:  negative for anorexia, fevers and sweats  Eyes: negative for irritation, redness and visual disturbance  Ears, nose, mouth, throat, and face: negative for earaches, epistaxis, nasal congestion and sore throat  Respiratory: negative for hemoptysis Cardiovascular: negative for, lower extremity edema, orthopnea, palpitations and syncope  Gastrointestinal: negative for abdominal pain, constipation, diarrhea, melena, nausea and vomiting  Genitourinary:negative for dysuria, frequency and hematuria  Hematologic/lymphatic: negative for bleeding, easy bruising and lymphadenopathy  Musculoskeletal:negative for arthralgias, muscle weakness and stiff joints  Neurological: negative for coordination problems, gait problems, headaches and weakness  Endocrine: negative for diabetic symptoms including polydipsia, polyuria and weight loss     Objective:   Physical Exam  Gen. Pleasant, obese, in no distress, normal affect ENT - no pallor,icterus, no post nasal drip, class 2-3 airway Neck: No JVD, no thyromegaly, no carotid bruits Lungs: no use of accessory muscles, no dullness to percussion, decreased without rales or rhonchi  Cardiovascular: Rhythm regular, heart sounds  normal, no murmurs or gallops, no peripheral edema Abdomen: soft and non-tender, no hepatosplenomegaly, BS normal. Musculoskeletal: No deformities, no cyanosis or clubbing Neuro:  alert, non focal, no tremors       Assessment & Plan:

## 2018-12-23 ENCOUNTER — Telehealth: Payer: Self-pay | Admitting: Pulmonary Disease

## 2018-12-23 NOTE — Telephone Encounter (Addendum)
Received a call from Arbie Cookey at [facility]  She states that Dr Elsworth Soho told her to call and have [place] call the pt with bronch info  Her bronch has been rescheduled from 01/04/19 to 12/30/18 at 7:30 am at [facility]  Looks like her covid testing will need to be rescheduled  I have called the pt through [employer] [phone]) and had to [facility]   Dr Elsworth Soho, I just want to ensure this information is correct, b/c appt notes still show bronch is scheduled for 01/04/19  Please advise and then will forward to Burman Nieves to get covid testing scheduled

## 2018-12-23 NOTE — Telephone Encounter (Signed)
Please confirm that bronchoscopy can be rescheduled for thursday 9/3 am & arrange for COVID testing accordingly

## 2018-12-23 NOTE — Telephone Encounter (Signed)
lmtcb for RT at [phone]. Pt currently scheduled for 9/8 at [facility] at 0730

## 2018-12-23 NOTE — Progress Notes (Signed)
Called [facility] [phone] Triage per Dr. Elsworth Soho request.  Spoke with Magda Paganini, RN  And gave her updated information for pt Bronchoscopy.  Procedure will now be 12/30/2018 at 07:30 at [place].  Pt will need covid test prior to procedure date.

## 2018-12-24 ENCOUNTER — Inpatient Hospital Stay: Payer: Self-pay | Attending: Hematology and Oncology

## 2018-12-24 ENCOUNTER — Other Ambulatory Visit: Payer: Self-pay | Admitting: Adult Health

## 2018-12-24 ENCOUNTER — Inpatient Hospital Stay: Payer: Self-pay

## 2018-12-24 ENCOUNTER — Other Ambulatory Visit: Payer: Self-pay

## 2018-12-24 VITALS — BP 156/77 | HR 76 | Temp 97.8°F | Resp 18

## 2018-12-24 DIAGNOSIS — Z95828 Presence of other vascular implants and grafts: Secondary | ICD-10-CM

## 2018-12-24 DIAGNOSIS — Z5111 Encounter for antineoplastic chemotherapy: Secondary | ICD-10-CM | POA: Insufficient documentation

## 2018-12-24 DIAGNOSIS — C50012 Malignant neoplasm of nipple and areola, left female breast: Secondary | ICD-10-CM | POA: Insufficient documentation

## 2018-12-24 DIAGNOSIS — Z170 Estrogen receptor positive status [ER+]: Secondary | ICD-10-CM

## 2018-12-24 DIAGNOSIS — C50011 Malignant neoplasm of nipple and areola, right female breast: Secondary | ICD-10-CM

## 2018-12-24 LAB — CMP (CANCER CENTER ONLY)
ALT: 31 U/L (ref 0–44)
AST: 37 U/L (ref 15–41)
Albumin: 3.8 g/dL (ref 3.5–5.0)
Alkaline Phosphatase: 69 U/L (ref 38–126)
Anion gap: 11 (ref 5–15)
BUN: 13 mg/dL (ref 6–20)
CO2: 26 mmol/L (ref 22–32)
Calcium: 9.8 mg/dL (ref 8.9–10.3)
Chloride: 103 mmol/L (ref 98–111)
Creatinine: 0.69 mg/dL (ref 0.44–1.00)
GFR, Est AFR Am: >60 mL/min (ref >60)
GFR, Estimated: >60 mL/min (ref >60)
Glucose, Bld: 105 mg/dL — ABNORMAL HIGH (ref 70–99)
Potassium: 4 mmol/L (ref 3.5–5.1)
Sodium: 140 mmol/L (ref 135–145)
Total Bilirubin: 0.6 mg/dL (ref 0.3–1.2)
Total Protein: 7.6 g/dL (ref 6.5–8.1)

## 2018-12-24 LAB — CBC WITH DIFFERENTIAL (CANCER CENTER ONLY)
Abs Immature Granulocytes: 0.02 10*3/uL (ref 0.00–0.07)
Basophils Absolute: 0 10*3/uL (ref 0.0–0.1)
Basophils Relative: 1 %
Eosinophils Absolute: 0.2 10*3/uL (ref 0.0–0.5)
Eosinophils Relative: 2 %
HCT: 37.5 % (ref 36.0–46.0)
Hemoglobin: 12.7 g/dL (ref 12.0–15.0)
Immature Granulocytes: 0 %
Lymphocytes Relative: 18 %
Lymphs Abs: 1.5 10*3/uL (ref 0.7–4.0)
MCH: 32.8 pg (ref 26.0–34.0)
MCHC: 33.9 g/dL (ref 30.0–36.0)
MCV: 96.9 fL (ref 80.0–100.0)
Monocytes Absolute: 0.6 10*3/uL (ref 0.1–1.0)
Monocytes Relative: 7 %
Neutro Abs: 5.8 10*3/uL (ref 1.7–7.7)
Neutrophils Relative %: 72 %
Platelet Count: 253 10*3/uL (ref 150–400)
RBC: 3.87 MIL/uL (ref 3.87–5.11)
RDW: 12.4 % (ref 11.5–15.5)
WBC Count: 8.1 10*3/uL (ref 4.0–10.5)
nRBC: 0 % (ref 0.0–0.2)

## 2018-12-24 MED ORDER — SODIUM CHLORIDE 0.9% FLUSH
10.0000 mL | INTRAVENOUS | Status: DC | PRN
  Administered 2018-12-24: 10 mL via INTRAVENOUS
  Filled 2018-12-24: qty 10

## 2018-12-24 MED ORDER — FULVESTRANT 250 MG/5ML IM SOLN
INTRAMUSCULAR | Status: AC
  Filled 2018-12-24: qty 5

## 2018-12-24 MED ORDER — FULVESTRANT 250 MG/5ML IM SOLN
500.0000 mg | Freq: Once | INTRAMUSCULAR | Status: AC
  Administered 2018-12-24: 500 mg via INTRAMUSCULAR

## 2018-12-24 MED ORDER — HEPARIN SOD (PORK) LOCK FLUSH 100 UNIT/ML IV SOLN
500.0000 [IU] | Freq: Once | INTRAVENOUS | Status: AC | PRN
  Administered 2018-12-24: 15:00:00 500 [IU] via INTRAVENOUS
  Filled 2018-12-24: qty 5

## 2018-12-24 NOTE — Telephone Encounter (Signed)
Attempted to call patient with [employer] since patient is Spanish speaking.  Patient did not answer, interpreter left voicemail to call us back as soon as possible to schedule COVID testing prior to procedure.

## 2018-12-24 NOTE — Telephone Encounter (Signed)
Please see other msg regarding this dated 12/23/2018

## 2018-12-24 NOTE — Telephone Encounter (Signed)
Yes bronchoscopy has been rescheduled for 9/3. Please get covid testing reschedule accordingly

## 2018-12-24 NOTE — Patient Instructions (Signed)
Fulvestrant injection What is this medicine? FULVESTRANT (ful VES trant) blocks the effects of estrogen. It is used to treat breast cancer. This medicine may be used for other purposes; ask your health care provider or pharmacist if you have questions. COMMON BRAND NAME(S): FASLODEX What should I tell my health care provider before I take this medicine? They need to know if you have any of these conditions:  bleeding disorders  liver disease  low blood counts, like low white cell, platelet, or red cell counts  an unusual or allergic reaction to fulvestrant, other medicines, foods, dyes, or preservatives  pregnant or trying to get pregnant  breast-feeding How should I use this medicine? This medicine is for injection into a muscle. It is usually given by a health care professional in a hospital or clinic setting. Talk to your pediatrician regarding the use of this medicine in children. Special care may be needed. Overdosage: If you think you have taken too much of this medicine contact a poison control center or emergency room at once. NOTE: This medicine is only for you. Do not share this medicine with others. What if I miss a dose? It is important not to miss your dose. Call your doctor or health care professional if you are unable to keep an appointment. What may interact with this medicine?  medicines that treat or prevent blood clots like warfarin, enoxaparin, dalteparin, apixaban, dabigatran, and rivaroxaban This list may not describe all possible interactions. Give your health care provider a list of all the medicines, herbs, non-prescription drugs, or dietary supplements you use. Also tell them if you smoke, drink alcohol, or use illegal drugs. Some items may interact with your medicine. What should I watch for while using this medicine? Your condition will be monitored carefully while you are receiving this medicine. You will need important blood work done while you are taking  this medicine. Do not become pregnant while taking this medicine or for at least 1 year after stopping it. Women of child-bearing potential will need to have a negative pregnancy test before starting this medicine. Women should inform their doctor if they wish to become pregnant or think they might be pregnant. There is a potential for serious side effects to an unborn child. Men should inform their doctors if they wish to father a child. This medicine may lower sperm counts. Talk to your health care professional or pharmacist for more information. Do not breast-feed an infant while taking this medicine or for 1 year after the last dose. What side effects may I notice from receiving this medicine? Side effects that you should report to your doctor or health care professional as soon as possible:  allergic reactions like skin rash, itching or hives, swelling of the face, lips, or tongue  feeling faint or lightheaded, falls  pain, tingling, numbness, or weakness in the legs  signs and symptoms of infection like fever or chills; cough; flu-like symptoms; sore throat  vaginal bleeding Side effects that usually do not require medical attention (report to your doctor or health care professional if they continue or are bothersome):  aches, pains  constipation  diarrhea  headache  hot flashes  nausea, vomiting  pain at site where injected  stomach pain This list may not describe all possible side effects. Call your doctor for medical advice about side effects. You may report side effects to FDA at 1-800-FDA-1088. Where should I keep my medicine? This drug is given in a hospital or clinic and will   not be stored at home. NOTE: This sheet is a summary. It may not cover all possible information. If you have questions about this medicine, talk to your doctor, pharmacist, or health care provider.  2020 Elsevier/Gold Standard (2017-07-23 11:34:41)  

## 2018-12-24 NOTE — Telephone Encounter (Addendum)
Forwarding to BettieJo to schedule covid testing please, thanks  I called pt again and had to leave detailed msg through [employer] with new appt for bronch

## 2018-12-27 NOTE — Telephone Encounter (Signed)
It appears the bronch was rescheduled to 9/8 and covid testing has been scheduled as well for 9/4.  Nothing further needed.

## 2018-12-29 ENCOUNTER — Telehealth: Payer: Self-pay | Admitting: Pulmonary Disease

## 2018-12-29 NOTE — Telephone Encounter (Signed)
Received update from Dr. Elsworth Soho for scheduling but unable to schedule now at 5:16pm as scheduling is closed.  Must follow up tomorrow.  Will leave encounter open.

## 2018-12-29 NOTE — Telephone Encounter (Signed)
Reschedule for 9/8 or 9/9 am please

## 2018-12-29 NOTE — Telephone Encounter (Signed)
Spoke with Arbie Cookey in respiratory. Pt is scheduled for a bronch on 12/30/2018. Pt was originally scheduled for a bronch on 01/04/2019, per Arbie Cookey Dr. Elsworth Soho called and had this moved up to 12/30/2018. Pt has NOT had her COVID screening test done on order to get her bronch done on 12/30/2018. Per the pt's appointment desk, the bronch on 01/04/2019 did not get canceled. This is turn got the pt's COVID test scheduled for 12/31/2018.  Dr. Elsworth Soho - please advise what other days you are available to do the pt's bronch. Thanks.

## 2018-12-29 NOTE — OR Nursing (Signed)
Spoke with Carol/RT & Ria Comment, office about not being able to proceed with bronch tomorrow because no covid test, due to scheduling mishaps. I am going to remove from bronch schedule, lindsay is trying to contact patient to tell her not to come tomorrow.

## 2018-12-30 ENCOUNTER — Ambulatory Visit (HOSPITAL_COMMUNITY): Admission: RE | Admit: 2018-12-30 | Payer: Self-pay | Source: Home / Self Care | Admitting: Pulmonary Disease

## 2018-12-30 ENCOUNTER — Ambulatory Visit (HOSPITAL_COMMUNITY): Admission: RE | Admit: 2018-12-30 | Payer: Self-pay | Source: Ambulatory Visit

## 2018-12-30 ENCOUNTER — Other Ambulatory Visit: Payer: Self-pay

## 2018-12-30 ENCOUNTER — Encounter (HOSPITAL_COMMUNITY): Admission: RE | Payer: Self-pay | Source: Home / Self Care

## 2018-12-30 SURGERY — BRONCHOSCOPY, WITH FLUOROSCOPY
Anesthesia: Moderate Sedation | Laterality: Bilateral

## 2018-12-30 NOTE — Telephone Encounter (Signed)
Returned call to [facility] regarding bronchoscopy for this patient.  She is currently working on rescheduling the patient and will confirm the appointment once it is complete. She has spoken with the patient also. Nothing further needed.

## 2018-12-31 ENCOUNTER — Other Ambulatory Visit (HOSPITAL_COMMUNITY)
Admission: RE | Admit: 2018-12-31 | Discharge: 2018-12-31 | Disposition: A | Discharge status: Home / Self Care | Payer: Self-pay | Source: Ambulatory Visit | Attending: Pulmonary Disease | Admitting: Pulmonary Disease

## 2018-12-31 ENCOUNTER — Telehealth: Payer: Self-pay | Admitting: Pulmonary Disease

## 2018-12-31 DIAGNOSIS — Z20828 Contact with and (suspected) exposure to other viral communicable diseases: Secondary | ICD-10-CM | POA: Insufficient documentation

## 2018-12-31 DIAGNOSIS — Z01812 Encounter for preprocedural laboratory examination: Secondary | ICD-10-CM | POA: Insufficient documentation

## 2018-12-31 NOTE — Progress Notes (Signed)
Called daughter of Felicia [name].  Felicia [name] [phone])  She retutned  My call this am at 08:50.  Gave her instructions for Bronchoscopy Procedure.  Pt. Needs to arrive by 06:30 on 01/04/2019 at [facility] hospital nothing to eat or drink after midnight on Monday. Her daughter Felicia [name] will be with her. Called the [facility] office at [phone] spoke with answering service.  They took a message to give to Felicia [name]  Also asked if [facility] office could set up for interpreter to be at admitting office at 06:30 on 01/04/2019. [place] Dr. Elsworth Soho on 12/30/2018.  He knows procedure is on 01/04/2019 at 07:30 at [facility]

## 2018-12-31 NOTE — Telephone Encounter (Signed)
Returned call to Arbie Cookey in respiratory dept who is filling in for Baxter Flattery to schedule bronchs.  She states the patient has been rescheduled for bronch on 9/8 (see note) and is requesting we assist with having an interpreter meet the patient by [phone] in admitting at [facility] on day of procedure. Demographics shows patient will need interpreter.  Contacted interpreter line to verify this patient will have interpreter available at the correct time and date but the representative on line requesting an 'access code' from this office before she can proceed.  Call ended at this time in order to locate office access code.   Contacted Janett Billow at [phone] who works with interpretation for in-person services. She received info to meet patient in admitting at [facility] by 0620 on 01/04/19.  Nothing further needed.  Returned call to Arbie Cookey to confirm and share info on how to reach [place] if needed.  Nothing further needed for this encounter.

## 2019-01-01 LAB — NOVEL CORONAVIRUS, NAA (HOSP ORDER, SEND-OUT TO REF LAB; TAT 18-24 HRS): SARS-CoV-2, NAA: NOT DETECTED

## 2019-01-04 ENCOUNTER — Encounter (HOSPITAL_COMMUNITY)
Admission: RE | Disposition: A | Discharge status: Home / Self Care | Payer: Self-pay | Source: Home / Self Care | Attending: Pulmonary Disease

## 2019-01-04 ENCOUNTER — Encounter (HOSPITAL_COMMUNITY): Payer: Self-pay | Admitting: Respiratory Therapy

## 2019-01-04 ENCOUNTER — Ambulatory Visit (HOSPITAL_COMMUNITY)
Admission: RE | Admit: 2019-01-04 | Discharge: 2019-01-04 | Disposition: A | Discharge status: Home / Self Care | Payer: Self-pay | Attending: Pulmonary Disease | Admitting: Pulmonary Disease

## 2019-01-04 ENCOUNTER — Ambulatory Visit (HOSPITAL_COMMUNITY)
Admission: RE | Admit: 2019-01-04 | Discharge: 2019-01-04 | Disposition: A | Discharge status: Home / Self Care | Payer: Self-pay | Source: Ambulatory Visit | Attending: Pulmonary Disease | Admitting: Pulmonary Disease

## 2019-01-04 DIAGNOSIS — Z9013 Acquired absence of bilateral breasts and nipples: Secondary | ICD-10-CM | POA: Insufficient documentation

## 2019-01-04 DIAGNOSIS — C7951 Secondary malignant neoplasm of bone: Secondary | ICD-10-CM | POA: Insufficient documentation

## 2019-01-04 DIAGNOSIS — I10 Essential (primary) hypertension: Secondary | ICD-10-CM | POA: Insufficient documentation

## 2019-01-04 DIAGNOSIS — Z170 Estrogen receptor positive status [ER+]: Secondary | ICD-10-CM | POA: Insufficient documentation

## 2019-01-04 DIAGNOSIS — Z79811 Long term (current) use of aromatase inhibitors: Secondary | ICD-10-CM | POA: Insufficient documentation

## 2019-01-04 DIAGNOSIS — J90 Pleural effusion, not elsewhere classified: Secondary | ICD-10-CM | POA: Insufficient documentation

## 2019-01-04 DIAGNOSIS — C50911 Malignant neoplasm of unspecified site of right female breast: Secondary | ICD-10-CM | POA: Insufficient documentation

## 2019-01-04 DIAGNOSIS — J45909 Unspecified asthma, uncomplicated: Secondary | ICD-10-CM | POA: Insufficient documentation

## 2019-01-04 DIAGNOSIS — C7802 Secondary malignant neoplasm of left lung: Secondary | ICD-10-CM | POA: Insufficient documentation

## 2019-01-04 DIAGNOSIS — C50912 Malignant neoplasm of unspecified site of left female breast: Secondary | ICD-10-CM | POA: Insufficient documentation

## 2019-01-04 HISTORY — PX: VIDEO BRONCHOSCOPY: SHX5072

## 2019-01-04 SURGERY — BRONCHOSCOPY, WITH FLUOROSCOPY
Anesthesia: Moderate Sedation | Laterality: Bilateral

## 2019-01-04 MED ORDER — MIDAZOLAM HCL (PF) 10 MG/2ML IJ SOLN
INTRAMUSCULAR | Status: DC | PRN
  Administered 2019-01-04 (×3): 1 mg via INTRAVENOUS

## 2019-01-04 MED ORDER — PHENYLEPHRINE HCL 0.25 % NA SOLN
1.0000 | Freq: Four times a day (QID) | NASAL | Status: DC | PRN
  Filled 2019-01-04: qty 15

## 2019-01-04 MED ORDER — FENTANYL CITRATE (PF) 100 MCG/2ML IJ SOLN
INTRAMUSCULAR | Status: AC
  Filled 2019-01-04: qty 4

## 2019-01-04 MED ORDER — HEPARIN SOD (PORK) LOCK FLUSH 100 UNIT/ML IV SOLN
500.0000 [IU] | INTRAVENOUS | Status: AC | PRN
  Administered 2019-01-04: 10:00:00 500 [IU]

## 2019-01-04 MED ORDER — FENTANYL CITRATE (PF) 100 MCG/2ML IJ SOLN
INTRAMUSCULAR | Status: DC | PRN
  Administered 2019-01-04 (×2): 25 ug via INTRAVENOUS
  Administered 2019-01-04 (×2): 50 ug via INTRAVENOUS

## 2019-01-04 MED ORDER — LIDOCAINE HCL URETHRAL/MUCOSAL 2 % EX GEL
CUTANEOUS | Status: DC | PRN
  Administered 2019-01-04: 1

## 2019-01-04 MED ORDER — SODIUM CHLORIDE 0.9% FLUSH: 10.0000 mL | INTRAVENOUS | Status: DC | PRN

## 2019-01-04 MED ORDER — SODIUM CHLORIDE 0.9 % IV SOLN
INTRAVENOUS | Status: DC
  Administered 2019-01-04: 08:00:00 via INTRAVENOUS

## 2019-01-04 MED ORDER — LIDOCAINE HCL 2 % EX GEL
1.0000 "application " | Freq: Once | CUTANEOUS | Status: DC
  Filled 2019-01-04: qty 4250

## 2019-01-04 MED ORDER — PHENYLEPHRINE HCL 0.25 % NA SOLN
NASAL | Status: DC | PRN
  Administered 2019-01-04: 2 via NASAL

## 2019-01-04 MED ORDER — LIDOCAINE HCL 1 % IJ SOLN
INTRAMUSCULAR | Status: DC | PRN
  Administered 2019-01-04: 6 mL via RESPIRATORY_TRACT

## 2019-01-04 MED ORDER — MIDAZOLAM HCL (PF) 5 MG/ML IJ SOLN
INTRAMUSCULAR | Status: AC
  Filled 2019-01-04: qty 2

## 2019-01-04 NOTE — Progress Notes (Signed)
Video Bronchoscopy done Intervention Bronchial washing done x2 Intervention Bronchial biopsy done Intervention Bronchial brushing done

## 2019-01-04 NOTE — Discharge Instructions (Signed)
Flexible Bronchoscopy, Care After This sheet gives you information about how to care for yourself after your test. Your doctor may also give you more specific instructions. If you have problems or questions, contact your doctor. Follow these instructions at home: Eating and drinking  Do not eat or drink anything (not even water) for 2 hours after your test, or until your numbing medicine (local anesthetic) wears off.  When your numbness is gone and your cough and gag reflexes have come back, you may: ? Eat only soft foods. ? Slowly drink liquids.  The day after the test, go back to your normal diet. Driving  Do not drive for 24 hours if you were given a medicine to help you relax (sedative).  Do not drive or use heavy machinery while taking prescription pain medicine. General instructions   Take over-the-counter and prescription medicines only as told by your doctor.  Return to your normal activities as told. Ask what activities are safe for you.  Do not use any products that have nicotine or tobacco in them. This includes cigarettes and e-cigarettes. If you need help quitting, ask your doctor.  Keep all follow-up visits as told by your doctor. This is important. It is very important if you had a tissue sample (biopsy) taken. Get help right away if:  You have shortness of breath that gets worse.  You get light-headed.  You feel like you are going to pass out (faint).  You have chest pain.  You cough up: ? More than a little blood. ? More blood than before. Summary  Do not eat or drink anything (not even water) for 2 hours after your test, or until your numbing medicine wears off.  Do not use cigarettes. Do not use e-cigarettes.  Get help right away if you have chest pain. This information is not intended to replace advice given to you by your health care provider. Make sure you discuss any questions you have with your health care provider. Document Released: 02/09/2009  Document Revised: 03/27/2017 Document Reviewed: 05/02/2016 Elsevier Patient Education  2020 [employer].  Nothing to eat or drink until   11:00   am today   01/04/2019 Any questions or concerns please call the office at [phone]

## 2019-01-04 NOTE — Interval H&P Note (Signed)
History and Physical Interval Note:  01/04/2019 99991111 AM  Felicia [name] Age  has presented today for surgery, with the diagnosis of endobronchial lession.  The various methods of treatment have been discussed with the patient and family. After consideration of risks, benefits and other options for treatment, the patient has consented to  Procedure(s): VIDEO BRONCHOSCOPY WITH FLUORO (Bilateral) as a surgical intervention.  The patient's history has been reviewed, patient examined, no change in status, stable for surgery.  I have reviewed the patient's chart and labs.  Questions were answered to the patient's satisfaction.     Leanna Sato Elsworth Soho

## 2019-01-04 NOTE — Op Note (Signed)
Indication: LLL endobronchial mass  in the 58 -year-old with breast cancer, suspected metastasis  Written informed consent was obtained from the patient prior to the procedure. The risks of the procedure including coughing, bleeding and a small chance of lung cancer requiring a chest tube were discussed with the patient in great detail and evidenced understanding.  3 mg of Versed and  166mg of fentanyl were used in divided doses during the procedure. Bronchoscope was inserted from the right Nare. The upper airway appeared normal. Vocal cord showed normal appearance in motion. The trachea bronchial tree was then examined to the subsegmental level. Minimal secretions were noted. Right sided airways were clear. Endobronchial lesion noted in the LLL bronchus after take off of superior segment  Attention was then turned to the left lower lobe. Bronchoalveolar lavage was obtained from the  lower lobe with good return. Brushings x 2 & endobronchial biopsies x3 were obtained from the left lower lobe bronchus . The patient tolerated procedure well with minimal bleeding. Cold saline was used intermittently to minimise bleeding  Pictures from provation scanned in as media  She was awake and alert at the end of the procedure.   RKara MeadMD. FShade Flood [facility] Pager [phone] no response call [phone]  01/04/2019

## 2019-01-05 ENCOUNTER — Encounter (HOSPITAL_COMMUNITY): Payer: Self-pay | Admitting: Pulmonary Disease

## 2019-01-05 LAB — ACID FAST SMEAR (AFB, MYCOBACTERIA): Acid Fast Smear: NEGATIVE

## 2019-01-06 LAB — CULTURE, BAL-QUANTITATIVE W GRAM STAIN: Culture: >=100000 — AB

## 2019-01-12 ENCOUNTER — Telehealth: Payer: Self-pay | Admitting: Hematology and Oncology

## 2019-01-12 NOTE — Telephone Encounter (Signed)
I called interpreter and she left a message with appointment time and date

## 2019-01-17 ENCOUNTER — Ambulatory Visit: Payer: Self-pay | Admitting: Hematology and Oncology

## 2019-01-19 NOTE — Progress Notes (Signed)
Patient Care Team: [name], MD as PCP - General (Family Medicine) Nicholas Lose, MD as Consulting Physician (Hematology and Oncology) Delice Bison, Charlestine Massed, NP as Nurse Practitioner (Hematology and Oncology) Kyung Rudd, MD as Consulting Physician (Radiation Oncology) Rolm Bookbinder, MD as Consulting Physician (General Surgery)  DIAGNOSIS:    ICD-10-CM   1. Bone metastasis ([place])  C79.51 DG FEMUR, MIN 2 VIEWS RIGHT    DG FEMUR MIN 2 VIEWS LEFT    CBC with Differential ([place])    CMP ([place] only)  2. Malignant neoplasm metastatic to lung, unspecified laterality ([place])  C78.00 DG FEMUR, MIN 2 VIEWS RIGHT    DG FEMUR MIN 2 VIEWS LEFT    CBC with Differential ([place])    CMP ([place] only)  3. 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, estrogen receptor positive ([place])  C50.011 CBC with Differential ([place])   Z17.0 CMP ([place] only)   C50.012     SUMMARY OF ONCOLOGIC HISTORY: Oncology History  Bilateral breast cancer ([place])  08/16/2015 Mammogram   Left breast LIQ 4.2 cm, 4 mm oval mass 12:00, 1.2 cm mass left UOQ, enlarged left axillary lymph node   08/16/2015 Mammogram   Right breast UIQ: 3.9 cm irregular mass, 1.5 cm mass in the right LOQ, 9 mm mass right UOQ, 6 mm mass right LIQ, enlarged lymph node Rt axilla   08/20/2015 Initial Diagnosis   Right breast biopsy 1:00: IDC grade 2 ER 95%, PR 2%, Ki 67 15%; HER-2 negative ratio 1.04; 10:00: IDC grade 2-3 dissimilar to 1:00 biopsy 10:00: ER 95%, PR 0%, K67: 25%, HER-2 positive ratio 6.42 right axillary lymph node positive for metastatic cancer   08/20/2015 Procedure   Left biopsy 8:00: IDC with DCIS, grade 2; ER 95%, PR 95%, Ki-67 30%, HER-2 negative ratio 1.2; 12:00: IDC with DCIS, grade 1 ER 95%, PR 95%, Ki-67 10% HER-2 negative ratio 1.14 not similar to this biopsy, left axillary lymph node positive,    09/13/2015 - 01/17/2016 Neo-Adjuvant Chemotherapy    Neoadjuvant dose dense Adriamycin and Cytoxan followed by Taxol Herceptin Perjeta, followed by Herceptin maintenance   01/21/2016 Breast MRI   Bilateral breast masses significantly decreased in size with mild residual NME, complete resolution of numerous other nodules in both breasts, bilateral axillary lymph nodes and bilateral retropectoral lymph nodes decreased in size   02/11/2016 Surgery   Right mastectomy: IDC 2 nodules, 2.5, 1 cm, margins negative, 3/7 lymph nodes positive T2 N1a stage II B, ER 95%, PR 2%, HER-2 negative, Ki-67 15% RCB burden 3.1 and 2.9 class II   02/11/2016 Surgery   Left mastectomy: IDC 3.1 cm with DCIS, margins negative, 0/8 lymph nodes, T2 N0 stage II a, RCB burden 2.18, class II, ER 100%, PR 95%, HER-2 negative ratio 1.2, Ki-67 30%   04/07/2016 - 05/15/2016 Radiation Therapy   Adjuvant radiation   06/26/2016 - 07/13/2017 Anti-estrogen oral therapy   Letrozole 2.5 mg daily   11/20/2016 Miscellaneous   Neratinib 240 mg daily discontinued 07/13/2017 when she presented with metastatic disease to the lungs   07/13/2017 Relapse/Recurrence   Progression of right and left hilar lymph nodes and bilateral lung nodules   07/13/2017 -  Anti-estrogen oral therapy   Ibrance with letrozole   10/24/2017 Imaging   CT chest abdomen pelvis: Interval decrease in the left hilar node.  The more superior left hilar node is slightly increased in size.  Interval decrease in the  size of multiple lung nodules   04/26/2018 Imaging   Interval development of multiple lytic bone metastases.  Lesion in the left acetabulum measures 5.5 cm, new lesion L3 extending to the left pedicle.  Perihilar nodule left lower lobe increased in size 2.8 cm from 1.8 cm, other nodules not changed.  Similar appearance of left hilar adenopathy, small perinephric soft tissue nodule increased in size   05/07/2018 - 05/19/2018 Radiation Therapy   Radiation to L3 (30 Gy in 10 Fx)   01/04/2019 Progression    Bronchoscopic lung biopsy (Dr. Clyda Greener): metastatic carcinoma, HER-2 negative by FISH, ER+ 50%, PR+ 10%.     CHIEF COMPLIANT: Follow-up of lung biopsy to review pathology  INTERVAL HISTORY: Felicia [name] is a 58 y.o. with above-mentioned history of metastatic breast cancer. She underwent a bronchoscopic lung biopsy on 01/04/19 with Dr. Elsworth Soho for which pathology confirmed metastatic carcinoma, HER-2 negative by FISH, ER+ 50%, PR+ 10%. She presents to the clinic today to review the pathology report and discuss further treatment.  Patient is here today in a wheelchair and tells me that since yesterday she has had worsened right hip pain and also left hip pain and discomfort.  She is having profound cough and discomfort in the ribs on the back.  Ibuprofen has not been adequate to control her symptoms.  REVIEW OF SYSTEMS:   Constitutional: Denies fevers, chills or abnormal weight loss Eyes: Denies blurriness of vision Ears, nose, mouth, throat, and face: Denies mucositis or sore throat Respiratory: Cough, shortness of breath to exertion, chest discomfort Cardiovascular: Denies palpitation, chest discomfort Gastrointestinal: Denies nausea, heartburn or change in bowel habits Skin: Denies abnormal skin rashes Lymphatics: Denies new lymphadenopathy or easy bruising Neurological: Denies numbness, tingling or new weaknesses Behavioral/Psych: Mood is stable, no new changes  Extremities: Severe right hip pain and moderate left hip pain Breast: denies any pain or lumps or nodules in either breasts All other systems were reviewed with the patient and are negative.  I have reviewed the past medical history, past surgical history, social history and family history with the patient and they are unchanged from previous note.  ALLERGIES:  is allergic to no known allergies.  MEDICATIONS:  Current Outpatient Medications  Medication Sig Dispense Refill  . albuterol (VENTOLIN HFA) 108 (90 Base)  MCG/ACT inhaler Inhale 2 puffs into the lungs every 4 (four) hours as needed for wheezing or shortness of breath. 1 Inhaler 5  . alpelisib (PIQRAY '300MG'$  DAILY DOSE) 2 x 150 MG Therapy Pack Take two '150mg'$  tablets with food at the same time daily. Swallow whole, do not crush, chew, or split. 60 each 1  . Calcium Carb-Cholecalciferol (CALCIUM 600+D) 600-800 MG-UNIT TABS Take 1 tablet by mouth daily.    . Cholecalciferol (VITAMIN D-3) 5000 units TABS Take 1 tablet by mouth daily.    Marland Kitchen letrozole (FEMARA) 2.5 MG tablet Take 1 tablet (2.5 mg total) by mouth daily. 90 tablet 3  . lidocaine-prilocaine (EMLA) cream Apply 1 application topically as needed (Apply 2- hours prior to access of port). 30 g 6  . lisinopril (PRINIVIL,ZESTRIL) 20 MG tablet Take 1 tablet (20 mg total) by mouth daily. 30 tablet 6  . oxyCODONE-acetaminophen (PERCOCET/ROXICET) 5-325 MG tablet Take 1 tablet by mouth every 6 (six) hours as needed for severe pain. 60 tablet 0  . pantoprazole (PROTONIX) 40 MG tablet Take 1 tablet (40 mg total) by mouth daily. 30 tablet 3   No current facility-administered medications for this visit.  Facility-Administered Medications Ordered in Other Visits  Medication Dose Route Frequency Provider Last Rate Last Dose  . sodium chloride flush (NS) 0.9 % injection 10 mL  10 mL Intravenous PRN Nicholas Lose, MD   10 mL at 04/14/17 0831    PHYSICAL EXAMINATION: ECOG PERFORMANCE STATUS: 1 - Symptomatic but completely ambulatory  Vitals:   01/20/19 0851  BP: (!) 146/92  Pulse: 79  Resp: 17  Temp: 98.3 F (36.8 C)  SpO2: 98%   Filed Weights   01/20/19 0851  Weight: 196 lb 11.2 oz (89.2 kg)    GENERAL: alert, no distress and comfortable SKIN: skin color, texture, turgor are normal, no rashes or significant lesions EYES: normal, Conjunctiva are pink and non-injected, sclera clear OROPHARYNX: no exudate, no erythema and lips, buccal mucosa, and tongue normal  NECK: supple, thyroid normal size,  non-tender, without nodularity LYMPH: no palpable lymphadenopathy in the cervical, axillary or inguinal LUNGS: Decreased breath sounds at the lung bases and crackles HEART: regular rate & rhythm and no murmurs and no lower extremity edema ABDOMEN: abdomen soft, non-tender and normal bowel sounds MUSCULOSKELETAL: no cyanosis of digits and no clubbing  NEURO: alert & oriented x 3 with fluent speech, no focal motor/sensory deficits EXTREMITIES: Pain in the lower extremities with difficulty with ambulation requiring a wheelchair  LABORATORY DATA:  I have reviewed the data as listed CMP Latest Ref Rng & Units 12/24/2018 11/19/2018 10/22/2018  Glucose 70 - 99 mg/dL 105(H) 118(H) 115(H)  BUN 6 - 20 mg/dL '13 12 12  '$ Creatinine 0.44 - 1.00 mg/dL 0.69 0.73 0.69  Sodium 135 - 145 mmol/L 140 137 139  Potassium 3.5 - 5.1 mmol/L 4.0 3.8 3.7  Chloride 98 - 111 mmol/L 103 104 107  CO2 22 - 32 mmol/L '26 25 22  '$ Calcium 8.9 - 10.3 mg/dL 9.8 9.4 8.7(L)  Total Protein 6.5 - 8.1 g/dL 7.6 7.5 7.4  Total Bilirubin 0.3 - 1.2 mg/dL 0.6 0.6 0.5  Alkaline Phos 38 - 126 U/L 69 63 62  AST 15 - 41 U/L 37 37 39  ALT 0 - 44 U/L '31 30 31    '$ Lab Results  Component Value Date   WBC 8.1 12/24/2018   HGB 12.7 12/24/2018   HCT 37.5 12/24/2018   MCV 96.9 12/24/2018   PLT 253 12/24/2018   NEUTROABS 5.8 12/24/2018    ASSESSMENT & PLAN:  Bilateral breast cancer ([place]) Treatment Summary: 1. Neoadjuvant chemotherapy with either AC followed by THP started 09/13/2015 and completed 12 weeks of Taxol 11/29/2015, Herceptin Perjeta 6; now on Herceptin  2. Bilateral mastectomies and lymph node surgery 02/11/16 3. Adjuvant radiation 04/07/2016 to 05/15/2016 4. Adjuvant antiestrogen therapy with Letrozole started 06/26/2016 5. Neratinib after Herceptin maintenancediscontinued 07/13/2017 when she developed metastatic disease 6.Ibrance with letrozole started March 2019 stopped 04/29/2018 due to progression 7. Palliative  radiation L2-4 and left pelvis completed 05/19/2018  [facility] molecular test: ER positive, PR positive, PI K3CA mutation present, HER-2 negative ER positive, TMB high 19 mutations, PDL 1 -0% ______________________________________________________________________________________ Current treatment: Faslodex Ibrance along with letrozole, Xgeva  CT chest 11/19/2018: Interval progression of metastatic disease, new nodule left lower lobe 2.2 cm, enlarging left pleural effusion.  Additional bilateral lung nodules and left hilar nodes are grossly stable.  New diffuse peribronchovascular and subpleural groundglass may be due to drug toxicity.  Bronchoscopy and biopsy 01/04/2019: Poorly differentiated adenocarcinoma ER 50% positive, PR 10%, HER-2 equivocal by IHC 2+, FISH negative ratio 1.03  Treatment plan: Change treatment  to Faslodex with Alpelisib Alpelisib (Piqray) Counseling: Alpelisib was studied in SOLAR-1 clinical trial: 449 patients postmenopausal woman who progressed on hormonal therapy were randomized to Fulvestrant + Alpelisib vs Fulvestrant. PFS was 11 months versus 5.7 months in patients who had PIK3CA mutation. Common side effects of Piqray are high blood sugar levels, increase in creatinine, diarrhea, rash, decrease in lymphocyte count, elevated liver enzymes, nausea, fatigue, anemia, increase in lipase, decreased appetite, stomatitis, vomiting, weight loss, low calcium levels, aPTT prolonged, and hair loss.  Bilateral hip pains: We will get x-rays today to rule out pathologic fractures. For the pain I sent a prescription for Percocet.  I instructed her to take stool softeners.  Return to clinic in 2 weeks after starting Alpelisib   Orders Placed This Encounter  Procedures  . DG FEMUR, MIN 2 VIEWS RIGHT    Standing Status:   Future    Standing Expiration Date:   03/21/2020    Order Specific Question:   Reason for Exam (SYMPTOM  OR DIAGNOSIS REQUIRED)    Answer:   Met breast cancer concern  for fracture    Order Specific Question:   Is patient pregnant?    Answer:   No    Order Specific Question:   Preferred imaging location?    Answer:   [facility]    Order Specific Question:   Radiology Contrast Protocol - do NOT remove file path    Answer:   \\[email or web address]\[email or web address].pdf  . DG FEMUR MIN 2 VIEWS LEFT    Standing Status:   Future    Standing Expiration Date:   03/21/2020    Order Specific Question:   Reason for Exam (SYMPTOM  OR DIAGNOSIS REQUIRED)    Answer:   met breast cancer concern for fracture    Order Specific Question:   Is patient pregnant?    Answer:   No    Order Specific Question:   Preferred imaging location?    Answer:   [facility]    Order Specific Question:   Radiology Contrast Protocol - do NOT remove file path    Answer:   \\[email or web address]\[email or web address]\[email or web address].pdf  . CBC with Differential ([place] Only)    Standing Status:   Future    Standing Expiration Date:   01/20/2020  . CMP ([place] only)    Standing Status:   Future    Standing Expiration Date:   01/20/2020   The patient has a good understanding of the overall plan. she agrees with it. she will call with any problems that may develop before the next visit here.  Nicholas Lose, MD 01/20/2019  Julious Oka Dorshimer am acting as scribe for Dr. Nicholas Lose.  I have reviewed the above documentation for accuracy and completeness, and I agree with the above.

## 2019-01-20 ENCOUNTER — Other Ambulatory Visit: Payer: Self-pay | Admitting: Adult Health

## 2019-01-20 ENCOUNTER — Inpatient Hospital Stay: Payer: Self-pay | Attending: Hematology and Oncology | Admitting: Hematology and Oncology

## 2019-01-20 ENCOUNTER — Inpatient Hospital Stay: Payer: Self-pay

## 2019-01-20 ENCOUNTER — Other Ambulatory Visit: Payer: Self-pay

## 2019-01-20 ENCOUNTER — Ambulatory Visit (HOSPITAL_COMMUNITY)
Admission: RE | Admit: 2019-01-20 | Discharge: 2019-01-20 | Disposition: A | Discharge status: Home / Self Care | Payer: Self-pay | Source: Ambulatory Visit | Attending: Hematology and Oncology | Admitting: Hematology and Oncology

## 2019-01-20 VITALS — BP 146/92 | HR 79 | Temp 98.3°F | Resp 17 | Ht 61.0 in | Wt 196.7 lb

## 2019-01-20 DIAGNOSIS — Z79811 Long term (current) use of aromatase inhibitors: Secondary | ICD-10-CM | POA: Insufficient documentation

## 2019-01-20 DIAGNOSIS — Z9221 Personal history of antineoplastic chemotherapy: Secondary | ICD-10-CM | POA: Insufficient documentation

## 2019-01-20 DIAGNOSIS — C50012 Malignant neoplasm of nipple and areola, left female breast: Secondary | ICD-10-CM

## 2019-01-20 DIAGNOSIS — C50011 Malignant neoplasm of nipple and areola, right female breast: Secondary | ICD-10-CM

## 2019-01-20 DIAGNOSIS — C7800 Secondary malignant neoplasm of unspecified lung: Secondary | ICD-10-CM

## 2019-01-20 DIAGNOSIS — Z79899 Other long term (current) drug therapy: Secondary | ICD-10-CM | POA: Insufficient documentation

## 2019-01-20 DIAGNOSIS — C773 Secondary and unspecified malignant neoplasm of axilla and upper limb lymph nodes: Secondary | ICD-10-CM | POA: Insufficient documentation

## 2019-01-20 DIAGNOSIS — Z170 Estrogen receptor positive status [ER+]: Secondary | ICD-10-CM | POA: Insufficient documentation

## 2019-01-20 DIAGNOSIS — C7951 Secondary malignant neoplasm of bone: Secondary | ICD-10-CM

## 2019-01-20 DIAGNOSIS — Z5111 Encounter for antineoplastic chemotherapy: Secondary | ICD-10-CM | POA: Insufficient documentation

## 2019-01-20 DIAGNOSIS — Z923 Personal history of irradiation: Secondary | ICD-10-CM | POA: Insufficient documentation

## 2019-01-20 DIAGNOSIS — Z95828 Presence of other vascular implants and grafts: Secondary | ICD-10-CM

## 2019-01-20 DIAGNOSIS — Z9013 Acquired absence of bilateral breasts and nipples: Secondary | ICD-10-CM | POA: Insufficient documentation

## 2019-01-20 LAB — CBC WITH DIFFERENTIAL (CANCER CENTER ONLY)
Abs Immature Granulocytes: 0.03 10*3/uL (ref 0.00–0.07)
Basophils Absolute: 0 10*3/uL (ref 0.0–0.1)
Basophils Relative: 0 %
Eosinophils Absolute: 0.1 10*3/uL (ref 0.0–0.5)
Eosinophils Relative: 1 %
HCT: 37.9 % (ref 36.0–46.0)
Hemoglobin: 12.7 g/dL (ref 12.0–15.0)
Immature Granulocytes: 0 %
Lymphocytes Relative: 19 %
Lymphs Abs: 1.4 10*3/uL (ref 0.7–4.0)
MCH: 30.8 pg (ref 26.0–34.0)
MCHC: 33.5 g/dL (ref 30.0–36.0)
MCV: 92 fL (ref 80.0–100.0)
Monocytes Absolute: 0.5 10*3/uL (ref 0.1–1.0)
Monocytes Relative: 8 %
Neutro Abs: 5.1 10*3/uL (ref 1.7–7.7)
Neutrophils Relative %: 72 %
Platelet Count: 205 10*3/uL (ref 150–400)
RBC: 4.12 MIL/uL (ref 3.87–5.11)
RDW: 12.4 % (ref 11.5–15.5)
WBC Count: 7.1 10*3/uL (ref 4.0–10.5)
nRBC: 0 % (ref 0.0–0.2)

## 2019-01-20 LAB — CMP (CANCER CENTER ONLY)
ALT: 30 U/L (ref 0–44)
AST: 38 U/L (ref 15–41)
Albumin: 3.8 g/dL (ref 3.5–5.0)
Alkaline Phosphatase: 80 U/L (ref 38–126)
Anion gap: 7 (ref 5–15)
BUN: 12 mg/dL (ref 6–20)
CO2: 26 mmol/L (ref 22–32)
Calcium: 10.1 mg/dL (ref 8.9–10.3)
Chloride: 104 mmol/L (ref 98–111)
Creatinine: 0.66 mg/dL (ref 0.44–1.00)
GFR, Est AFR Am: >60 mL/min (ref >60)
GFR, Estimated: >60 mL/min (ref >60)
Glucose, Bld: 119 mg/dL — ABNORMAL HIGH (ref 70–99)
Potassium: 3.7 mmol/L (ref 3.5–5.1)
Sodium: 137 mmol/L (ref 135–145)
Total Bilirubin: 0.6 mg/dL (ref 0.3–1.2)
Total Protein: 7.6 g/dL (ref 6.5–8.1)

## 2019-01-20 MED ORDER — FULVESTRANT 250 MG/5ML IM SOLN
INTRAMUSCULAR | Status: AC
  Filled 2019-01-20: qty 5

## 2019-01-20 MED ORDER — OXYCODONE-ACETAMINOPHEN 5-325 MG PO TABS: 1.0000 | ORAL_TABLET | Freq: Four times a day (QID) | ORAL | 0 refills | Status: AC | PRN

## 2019-01-20 MED ORDER — DENOSUMAB 120 MG/1.7ML ~~LOC~~ SOLN
SUBCUTANEOUS | Status: AC
  Filled 2019-01-20: qty 1.7

## 2019-01-20 MED ORDER — HEPARIN SOD (PORK) LOCK FLUSH 100 UNIT/ML IV SOLN
500.0000 [IU] | Freq: Once | INTRAVENOUS | Status: AC | PRN
  Administered 2019-01-20: 10:00:00 500 [IU] via INTRAVENOUS
  Filled 2019-01-20: qty 5

## 2019-01-20 MED ORDER — FULVESTRANT 250 MG/5ML IM SOLN
500.0000 mg | Freq: Once | INTRAMUSCULAR | Status: AC
  Administered 2019-01-20: 500 mg via INTRAMUSCULAR

## 2019-01-20 MED ORDER — ALPELISIB (300 MG DAILY DOSE) 2 X 150 MG PO TBPK: ORAL_TABLET | ORAL | 1 refills | Status: DC

## 2019-01-20 MED ORDER — SODIUM CHLORIDE 0.9% FLUSH
10.0000 mL | INTRAVENOUS | Status: DC | PRN
  Administered 2019-01-20: 10:00:00 10 mL via INTRAVENOUS
  Filled 2019-01-20: qty 10

## 2019-01-20 MED ORDER — PANTOPRAZOLE SODIUM 40 MG PO TBEC: 40.0000 mg | DELAYED_RELEASE_TABLET | Freq: Every day | ORAL | 3 refills | Status: AC

## 2019-01-20 MED ORDER — DENOSUMAB 120 MG/1.7ML ~~LOC~~ SOLN
120.0000 mg | Freq: Once | SUBCUTANEOUS | Status: AC
  Administered 2019-01-20: 12:00:00 120 mg via SUBCUTANEOUS

## 2019-01-20 NOTE — Patient Instructions (Signed)

## 2019-01-20 NOTE — Assessment & Plan Note (Signed)
Treatment Summary: 1. Neoadjuvant chemotherapy with either AC followed by THP started 09/13/2015 and completed 12 weeks of Taxol 11/29/2015, Herceptin Perjeta 6; now on Herceptin  2. Bilateral mastectomies and lymph node surgery 02/11/16 3. Adjuvant radiation 04/07/2016 to 05/15/2016 4. Adjuvant antiestrogen therapy with Letrozole started 06/26/2016 5. Neratinib after Herceptin maintenancediscontinued 07/13/2017 when she developed metastatic disease 6.Ibrance with letrozole started March 2019 stopped 04/29/2018 due to progression 7. Palliative radiation L2-4 and left pelvis completed 05/19/2018  Car[facility] molecular test: ER positive, PR positive, PI K3CA mutation present, HER-2 negative ER positive, TMB high 19 mutations, PDL 1 -0% ______________________________________________________________________________________ Current treatment: Faslodex Ibrance along with letrozole, Xgeva  CT chest 11/19/2018: Interval progression of metastatic disease, new nodule left lower lobe 2.2 cm, enlarging left pleural effusion.  Additional bilateral lung nodules and left hilar nodes are grossly stable.  New diffuse peribronchovascular and subpleural groundglass may be due to drug toxicity.  Bronchoscopy and biopsy 01/04/2019: Poorly differentiated adenocarcinoma ER 50% positive, PR 10%, HER-2 equivocal by IHC 2+, FISH negative ratio 1.03  Treatment plan: Change treatment to Faslodex with Alpelisib Alpelisib (Piqray) Counseling: Alpelisib was studied in SOLAR-1 clinical trial: 572 patients postmenopausal woman who progressed on hormonal therapy were randomized to Fulvestrant + Alpelisib vs Fulvestrant. PFS was 11 months versus 5.7 months in patients who had PIK3CA mutation. Common side effects of Piqray are high blood sugar levels, increase in creatinine, diarrhea, rash, decrease in lymphocyte count, elevated liver enzymes, nausea, fatigue, anemia, increase in lipase, decreased appetite, stomatitis, vomiting, weight  loss, low calcium levels, aPTT prolonged, and hair loss.  Return to clinic in 2 weeks after starting Alpelisib  

## 2019-01-20 NOTE — Patient Instructions (Signed)
Denosumab injection What is this medicine? DENOSUMAB (den oh sue mab) slows bone breakdown. Prolia is used to treat osteoporosis in women after menopause and in men, and in people who are taking corticosteroids for 6 months or more. Xgeva is used to treat a high calcium level due to cancer and to prevent bone fractures and other bone problems caused by multiple myeloma or cancer bone metastases. Xgeva is also used to treat giant cell tumor of the bone. This medicine may be used for other purposes; ask your health care provider or pharmacist if you have questions. COMMON BRAND NAME(S): Prolia, XGEVA What should I tell my health care provider before I take this medicine? They need to know if you have any of these conditions:  dental disease  having surgery or tooth extraction  infection  kidney disease  low levels of calcium or Vitamin D in the blood  malnutrition  on hemodialysis  skin conditions or sensitivity  thyroid or parathyroid disease  an unusual reaction to denosumab, other medicines, foods, dyes, or preservatives  pregnant or trying to get pregnant  breast-feeding How should I use this medicine? This medicine is for injection under the skin. It is given by a health care professional in a hospital or clinic setting. A special MedGuide will be given to you before each treatment. Be sure to read this information carefully each time. For Prolia, talk to your pediatrician regarding the use of this medicine in children. Special care may be needed. For Xgeva, talk to your pediatrician regarding the use of this medicine in children. While this drug may be prescribed for children as young as 13 years for selected conditions, precautions do apply. Overdosage: If you think you have taken too much of this medicine contact a poison control center or emergency room at once. NOTE: This medicine is only for you. Do not share this medicine with others. What if I miss a dose? It is  important not to miss your dose. Call your doctor or health care professional if you are unable to keep an appointment. What may interact with this medicine? Do not take this medicine with any of the following medications:  other medicines containing denosumab This medicine may also interact with the following medications:  medicines that lower your chance of fighting infection  steroid medicines like prednisone or cortisone This list may not describe all possible interactions. Give your health care provider a list of all the medicines, herbs, non-prescription drugs, or dietary supplements you use. Also tell them if you smoke, drink alcohol, or use illegal drugs. Some items may interact with your medicine. What should I watch for while using this medicine? Visit your doctor or health care professional for regular checks on your progress. Your doctor or health care professional may order blood tests and other tests to see how you are doing. Call your doctor or health care professional for advice if you get a fever, chills or sore throat, or other symptoms of a cold or flu. Do not treat yourself. This drug may decrease your body's ability to fight infection. Try to avoid being around people who are sick. You should make sure you get enough calcium and vitamin D while you are taking this medicine, unless your doctor tells you not to. Discuss the foods you eat and the vitamins you take with your health care professional. See your dentist regularly. Brush and floss your teeth as directed. Before you have any dental work done, tell your dentist you are   receiving this medicine. Do not become pregnant while taking this medicine or for 5 months after stopping it. Talk with your doctor or health care professional about your birth control options while taking this medicine. Women should inform their doctor if they wish to become pregnant or think they might be pregnant. There is a potential for serious side  effects to an unborn child. Talk to your health care professional or pharmacist for more information. What side effects may I notice from receiving this medicine? Side effects that you should report to your doctor or health care professional as soon as possible:  allergic reactions like skin rash, itching or hives, swelling of the face, lips, or tongue  bone pain  breathing problems  dizziness  jaw pain, especially after dental work  redness, blistering, peeling of the skin  signs and symptoms of infection like fever or chills; cough; sore throat; pain or trouble passing urine  signs of low calcium like fast heartbeat, muscle cramps or muscle pain; pain, tingling, numbness in the hands or feet; seizures  unusual bleeding or bruising  unusually weak or tired Side effects that usually do not require medical attention (report to your doctor or health care professional if they continue or are bothersome):  constipation  diarrhea  headache  joint pain  loss of appetite  muscle pain  runny nose  tiredness  upset stomach This list may not describe all possible side effects. Call your doctor for medical advice about side effects. You may report side effects to FDA at 1-800-FDA-1088. Where should I keep my medicine? This medicine is only given in a clinic, doctor's office, or other health care setting and will not be stored at home. NOTE: This sheet is a summary. It may not cover all possible information. If you have questions about this medicine, talk to your doctor, pharmacist, or health care provider.  2020 Elsevier/Gold Standard (2017-08-21 16:10:44) Fulvestrant injection What is this medicine? FULVESTRANT (ful VES trant) blocks the effects of estrogen. It is used to treat breast cancer. This medicine may be used for other purposes; ask your health care provider or pharmacist if you have questions. COMMON BRAND NAME(S): FASLODEX What should I tell my health care  provider before I take this medicine? They need to know if you have any of these conditions:  bleeding disorders  liver disease  low blood counts, like low white cell, platelet, or red cell counts  an unusual or allergic reaction to fulvestrant, other medicines, foods, dyes, or preservatives  pregnant or trying to get pregnant  breast-feeding How should I use this medicine? This medicine is for injection into a muscle. It is usually given by a health care professional in a hospital or clinic setting. Talk to your pediatrician regarding the use of this medicine in children. Special care may be needed. Overdosage: If you think you have taken too much of this medicine contact a poison control center or emergency room at once. NOTE: This medicine is only for you. Do not share this medicine with others. What if I miss a dose? It is important not to miss your dose. Call your doctor or health care professional if you are unable to keep an appointment. What may interact with this medicine?  medicines that treat or prevent blood clots like warfarin, enoxaparin, dalteparin, apixaban, dabigatran, and rivaroxaban This list may not describe all possible interactions. Give your health care provider a list of all the medicines, herbs, non-prescription drugs, or dietary supplements you use.   Also tell them if you smoke, drink alcohol, or use illegal drugs. Some items may interact with your medicine. What should I watch for while using this medicine? Your condition will be monitored carefully while you are receiving this medicine. You will need important blood work done while you are taking this medicine. Do not become pregnant while taking this medicine or for at least 1 year after stopping it. Women of child-bearing potential will need to have a negative pregnancy test before starting this medicine. Women should inform their doctor if they wish to become pregnant or think they might be pregnant. There is  a potential for serious side effects to an unborn child. Men should inform their doctors if they wish to father a child. This medicine may lower sperm counts. Talk to your health care professional or pharmacist for more information. Do not breast-feed an infant while taking this medicine or for 1 year after the last dose. What side effects may I notice from receiving this medicine? Side effects that you should report to your doctor or health care professional as soon as possible:  allergic reactions like skin rash, itching or hives, swelling of the face, lips, or tongue  feeling faint or lightheaded, falls  pain, tingling, numbness, or weakness in the legs  signs and symptoms of infection like fever or chills; cough; flu-like symptoms; sore throat  vaginal bleeding Side effects that usually do not require medical attention (report to your doctor or health care professional if they continue or are bothersome):  aches, pains  constipation  diarrhea  headache  hot flashes  nausea, vomiting  pain at site where injected  stomach pain This list may not describe all possible side effects. Call your doctor for medical advice about side effects. You may report side effects to FDA at 1-800-FDA-1088. Where should I keep my medicine? This drug is given in a hospital or clinic and will not be stored at home. NOTE: This sheet is a summary. It may not cover all possible information. If you have questions about this medicine, talk to your doctor, pharmacist, or health care provider.  2020 Elsevier/Gold Standard (2017-07-23 11:34:41)  

## 2019-01-21 ENCOUNTER — Telehealth: Payer: Self-pay | Admitting: Pharmacist

## 2019-01-21 DIAGNOSIS — C50811 Malignant neoplasm of overlapping sites of right female breast: Secondary | ICD-10-CM

## 2019-01-21 NOTE — Telephone Encounter (Signed)
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  Alpelisib is planned to be administered at 300 mg by mouth once daily  Labs from 01/20/19 assessed, OK for treatment initiation.  Noted baseline hyperglycemia, patient is not noted with diabetes and does not have any anti-hyperglycemic agents in current medication list  Addition of extended-release metformin with alpelisib initiation will be discussed with MD due to the baseline increased blood glucose  Current medication list in Epic reviewed, no DDIs with alpelisib identified.  Prescription has been e-scribed to the [facility] for benefits analysis and approval. Patient does not have insurance coverage. She has previously been on manufacturer assistance program to receive her palbociclib. We will work with the patient's daughter, Olin Hauser, and Rich interpreter, Lavon Paganini, to complete manufacturer assistance application in an effort to obtain the alpelisib.  Oral Oncology Clinic will continue to follow for initial counseling and start date.  Johny Drilling, PharmD, BCPS, BCOP  01/21/2019 9:26 AM Oral Oncology Clinic [phone]

## 2019-01-24 ENCOUNTER — Other Ambulatory Visit: Payer: Self-pay

## 2019-01-24 ENCOUNTER — Telehealth: Payer: Self-pay

## 2019-01-24 MED ORDER — ALPELISIB (300 MG DAILY DOSE) 2 X 150 MG PO TBPK: ORAL_TABLET | ORAL | 0 refills | Status: DC

## 2019-01-24 NOTE — Telephone Encounter (Addendum)
Oral Oncology Patient Advocate Encounter  Received notification from [employer] Patient Assistance program that patient has been successfully enrolled into their program to receive Piqray from the manufacturer at $0 out of pocket until 01/24/20.  I called and spoke with Gregary Signs, Optometrist. She will give the patient this information.  She knows we will have to re-apply.   Patient knows to call the office with questions or concerns.   Oral Oncology Clinic will continue to follow.  [place] Patient [place] Phone [phone] Fax [phone] 01/24/2019    3:17 PM

## 2019-01-24 NOTE — Telephone Encounter (Signed)
Oral Oncology Patient Advocate Encounter  The patient is uninsured and does not qualify for medicaid because she is not a [place] citizen.  I can help her apply for [employer] patient assistance online. I do not need any information or a signature from her, just the ok to submit the application. This will be in an attempt to get the [place] at $0 out of pocket cost to her.  I spoke with Gregary Signs, translator and she gave me the ok from the patient to submit the application.  I faxed the completed application today, [id number].  This encounter will be updated until final determination.  [place] Patient [place] Phone [phone] Fax [phone] 01/24/2019   11:10 AM

## 2019-01-24 NOTE — Telephone Encounter (Addendum)
Oral Oncology Patient Advocate Encounter  I received notification from [employer] that the patient has been approved to receive the 14 day supply free while the application is in process.   [employer] requested a new prescription and will send the medicine when they process that prescription. I did send a prescription request to the nurse to fax to [phone].  I called Gregary Signs, translator and gave her this information. She will pass this information on to the patient.  This encounter will be updated until final determination.  Felicia [name] Patient [place] Phone [phone] Fax [phone] 01/24/2019   12:15 PM

## 2019-01-24 NOTE — Telephone Encounter (Signed)
[employer] patient assistance wants to send the patient a free 14 day supply of Piqray while the application is in process and they need a new prescription faxed to [phone].    RX faxed successfully to above number.

## 2019-01-25 NOTE — Telephone Encounter (Signed)
Oral Chemotherapy Pharmacist Encounter   Attempted to reach patient's daughter, Olin Hauser, to provide update and offer for initial counseling on oral medication: Piqray (alpelisib).  No answer.  Left voicemail for Olin Hauser to call back to discuss details of medication acquisition and initial counseling session.  Patient has been successfully enrolled to receive alpelisib through [employer] assistance program.  Patient will need to initiate metformin for blood glucose control and cetirizine for rash prevention. MD agrees. I will discuss this with Olin Hauser when we are able to speak and inquire about best local pharmacy for these prescriptions.  Johny Drilling, PharmD, BCPS, BCOP  01/25/2019   10:22 AM Oral Oncology Clinic [phone]

## 2019-01-27 MED ORDER — METFORMIN HCL ER 500 MG PO TB24: 500.0000 mg | ORAL_TABLET | Freq: Every day | ORAL | 2 refills | Status: DC

## 2019-01-27 NOTE — Telephone Encounter (Signed)
Oral Chemotherapy Pharmacist Encounter   I spoke with patient's daughter, Felicia [name], for overview of new oral chemotherapy medication: Piqray (alpelisib) for the treatment of metastatic, hormone receptor positive breast cancer, previously treated with endocrine based therapy, with known PIK3CA mutation, in conjunction with Faslodex, planned duration until disease progression or unacceptable toxicity.  Counseled on administration, dosing, side effects, monitoring, drug-food interactions, safe handling, storage, and disposal.  Patient will take Piqray 169m tablets, 2 tablets (300 mg) by mouth once daily with food.  Patient continues on fulvestrant injections.  [place] date: TBD, pending medication acquisition Patient has been enrolled to receive her Piqray from manufacturer assistance program with [employer] They have not yet heard from patient assistance pharmacy to schedule 1st shipment of PZion  Side effects include but not limited to: hyperglycemia, rash/hypersensitivity reaction, diarrhea, fatigue, nausea, vomiting, mouth sores, alopecia, taste changes, decreased appetite, severe lung reactions, and arthralgias.  Patient has anti-emetic on hand and knows to take it if nausea develops.   They will obtain anti diarrheal and alert the office of 4 or more loose stools above baseline.  Discussed with POlin Hauserneed for glucose control prior to [place]initiation, and possible need for anti-diabetic agents for the treatment of medication-induced hyperglycemia while on treatment with Piqray.   Prescription for metformin XR 500 mg once daily [name]. She will pick this up as soon as she is able and have her mom start on it once they get it.  Discussed possibility of severe cutaneous reactions with Piqray. Patient will alert the office of any dermatologic toxicity that may occur after Piqray initiation. POlin Hauserinstructed to  obtain over the counter anti-histamine (fexofenatine, loratadine, or cetirizine) and have her mom start it as soon as they get it.  Reviewed with patient importance of keeping a medication schedule and plan for any missed doses.  Medication reconciliation performed and medication/allergy list updated.  All questions answered.  POlin Hauservoiced understanding and appreciation.   They know to call the office with questions or concerns.  Felicia [name] PharmD, BCPS, BCOP  01/27/2019   1:08 PM Oral Oncology Clinic [phone]

## 2019-01-27 NOTE — Telephone Encounter (Signed)
Oral Chemotherapy Pharmacist Encounter   Second attempt to reach patient's daughter, Olin Hauser, to provide update and offer for initial counseling on oral medication: Piqray (alpelisib).  No answer.  Left voicemail for Olin Hauser to call back to discuss details of medication acquisition and initial counseling session.  Patient has been successfully enrolled to receive alpelisib through [employer] assistance program.  Patient will need to initiate metformin for blood glucose control and cetirizine for rash prevention. MD agrees. I will discuss this with Olin Hauser when we are able to speak and inquire about best local pharmacy for these prescriptions.  Johny Drilling, PharmD, BCPS, BCOP  01/27/2019   10:35 AM Oral Oncology Clinic [phone]

## 2019-01-31 ENCOUNTER — Other Ambulatory Visit: Payer: Self-pay

## 2019-01-31 ENCOUNTER — Inpatient Hospital Stay: Payer: Self-pay | Attending: Hematology and Oncology | Admitting: Medical

## 2019-01-31 ENCOUNTER — Telehealth: Payer: Self-pay | Admitting: *Deleted

## 2019-01-31 VITALS — BP 143/91 | HR 94 | Temp 98.7°F | Resp 18 | Ht 61.0 in | Wt 195.4 lb

## 2019-01-31 DIAGNOSIS — R519 Headache, unspecified: Secondary | ICD-10-CM | POA: Insufficient documentation

## 2019-01-31 DIAGNOSIS — R600 Localized edema: Secondary | ICD-10-CM | POA: Insufficient documentation

## 2019-01-31 DIAGNOSIS — C7800 Secondary malignant neoplasm of unspecified lung: Secondary | ICD-10-CM | POA: Insufficient documentation

## 2019-01-31 DIAGNOSIS — G939 Disorder of brain, unspecified: Secondary | ICD-10-CM | POA: Insufficient documentation

## 2019-01-31 DIAGNOSIS — Z170 Estrogen receptor positive status [ER+]: Secondary | ICD-10-CM | POA: Insufficient documentation

## 2019-01-31 DIAGNOSIS — C7951 Secondary malignant neoplasm of bone: Secondary | ICD-10-CM | POA: Insufficient documentation

## 2019-01-31 DIAGNOSIS — C50312 Malignant neoplasm of lower-inner quadrant of left female breast: Secondary | ICD-10-CM | POA: Insufficient documentation

## 2019-01-31 DIAGNOSIS — R220 Localized swelling, mass and lump, head: Secondary | ICD-10-CM | POA: Insufficient documentation

## 2019-01-31 DIAGNOSIS — C50211 Malignant neoplasm of upper-inner quadrant of right female breast: Secondary | ICD-10-CM | POA: Insufficient documentation

## 2019-01-31 DIAGNOSIS — Z5111 Encounter for antineoplastic chemotherapy: Secondary | ICD-10-CM | POA: Insufficient documentation

## 2019-01-31 MED ORDER — FUROSEMIDE 20 MG PO TABS: ORAL_TABLET | ORAL | 0 refills | Status: AC

## 2019-01-31 NOTE — Progress Notes (Signed)
Pt seen by PA Lucianne Lei only, no [facility] RN assessment at this time.  PA aware.  Spanish interpreter Gregary Signs present for visit in [facility] today.

## 2019-01-31 NOTE — Patient Instructions (Signed)
COVID-19: Cómo protegerse y proteger a los demás °COVID-19: How to Protect Yourself and Others °Sepa cómo se propaga °· Actualmente, no existe ninguna vacuna para prevenir la enfermedad por coronavirus 2019 (COVID-19). °· La mejor forma de prevenir la enfermedad es evitar exponerse a este virus. °· Se cree que el virus se transmite principalmente de una persona a otra. °? Entre las personas que están en contacto directo entre sí (a una distancia inferior a 6 pies [1.80 m]). °? A través de las gotitas respiratorias producidas cuando una persona infectada tose, estornuda o habla. °? Estas gotitas pueden caer en la boca o en la nariz de las personas que están cerca o pueden ser inhaladas hacia los pulmones. °? Algunos estudios recientes sugieren que la COVID-19 puede ser transmitida por personas que no presentan síntomas. °Lo que todos deben hacer °Límpiese las manos con frecuencia °· Lávese las manos con frecuencia con agua y jabón durante al menos 20 segundos, especialmente después de haber estado en un lugar público o después de sonarse la nariz, toser o estornudar. °· Si no dispone de agua y jabón, use un desinfectante de manos que contenga al menos un 60 % de alcohol. Cubra todas las superficies de las manos y frótelas hasta que se sientan secas. °· No se toque los ojos, la nariz y la boca sin antes lavarse las manos. °Evite el contacto cercano °· Quédese en casa si está enfermo. °· Evite el contacto cercano con personas que estén enfermas. °· Establezca distancia entre usted y otras personas. °? Recuerde que algunas personas que no tienen síntomas pueden transmitir el virus. °? Esto es especialmente importante para las personas que tienen más riesgo de enfermarse.www.cdc.gov/coronavirus/2019-ncov/need-extra-precautions/people-at-higher-risk.html °Cúbrase la boca y la nariz con un barbijo de tela cuando esté cerca de otras personas °· Puede transmitir la COVID-19 a otras personas aunque no se sienta  enfermo. °· Todas las personas deben usar un barbijo de tela cuando tengan que ir a un lugar público, por ejemplo, al supermercado o a buscar otros productos necesarios. °? Los barbijos de tela no deben colocarse a niños menores de 2 años de edad, a las personas que tienen problemas respiratorios o que estén inconscientes, incapacitadas o que por algún motivo no puedan quitarse la mascarilla sin ayuda. °· El propósito del barbijo de tela es proteger a otras personas en caso de que usted esté infectado. °· NO utilice las mascarillas destinadas a los trabajadores de la salud. °· Continúe manteniendo una distancia aproximada de 6 pies (1.80 m) entre usted y otras personas. El barbijo de tela no reemplaza el distanciamiento social. °Cúbrase al toser y estornudar °· Si está en un ambiente privado y no tiene el barbijo de tela, recuerde siempre cubrirse la boca y la nariz con un pañuelo descartable al toser o estornudar, o usar el pliegue del codo. °· Deseche los pañuelos descartables usados en la basura. °· Inmediatamente, lávese las manos con agua y jabón durante al menos 20 segundos. Si no dispone de agua y jabón, límpiese las manos con un desinfectante de manos que contenga al menos un 60 % de alcohol. °Limpie y desinfecte °· Limpie Y desinfecte las superficies que se tocan con frecuencia todos los días. Esto incluye mesas, picaportes, interruptores de luz, encimeras, mangos, escritorios, teléfonos, teclados, inodoros, grifos y lavabos. www.cdc.gov/coronavirus/2019-ncov/prevent-getting-sick/disinfecting-your-home.html °· Si las superficies están sucias, límpielas: Use detergente o jabón y agua antes de la desinfección. °· Luego, use un desinfectante doméstico. Puede consultar una lista de los desinfectantes domésticos registrados en la Environmental Protection Agency (EPA) (  Agencia de Protección Ambiental) aquí. °cdc.gov/coronavirus °08/31/2018 °Esta información no tiene como fin reemplazar el consejo del médico.  Asegúrese de hacerle al médico cualquier pregunta que tenga. °Document Released: 08/19/2018 Document Revised: 09/14/2018 Document Reviewed: 08/10/2018 °Elsevier Patient Education © 2020 Elsevier Inc. ° °

## 2019-01-31 NOTE — Telephone Encounter (Signed)
Pt reported to Almyra Free, interpreter that her face has been swelling since yesterday. Dr Lindi Adie would like her to be seen today in [facility]. Message to scheduler. Interpreter notified patient to come at 2:00pm.

## 2019-02-01 NOTE — Progress Notes (Signed)
Symptoms Management Clinic Progress Note   Felicia [name] RE:[id number] 05/18/1960 58 y.o.  Felicia [name] is managed by Dr. Nicholas Lose  Actively treated with chemotherapy/immunotherapy/hormonal therapy: pending start of Alpelisib  Next scheduled appointment with provider: 02/03/2019  Assessment: Plan:    Localized edema - Plan: furosemide (LASIX) 20 MG tablet, CT Head W Wo Contrast  Nonintractable headache, unspecified chronicity pattern, unspecified headache type - Plan: CT Head W Wo Contrast   Localized facial edema: The patient was given a prescription for Lasix 20 mg once daily and instructed to take for four days only before stopping.  She then can restart it for an additional 4 days should she have recurrent edema.  Metastatic breast cancer with new onset head pain: The patient has been referred for head CT.  She will return for routine follow-up with Dr. Nicholas Lose on 02/03/2019.  Please see After Visit Summary for patient specific instructions.  Future Appointments  Date Time Provider [place]  02/03/2019  8:15 AM [facility] ONLY [facility] None  02/03/2019  8:30 AM Nicholas Lose, MD [facility] None    Orders Placed This Encounter  Procedures   CT Head W Wo Contrast       Subjective:   Patient ID:  Felicia [name] is a 58 y.o. (DOB March 25, 1961) female.  Chief Complaint:  Chief Complaint  Patient presents with   Facial Swelling    HPI Felicia [name]  Is a 58 y.o. female with a diagnosis of a metastatic breast cancer.  She is managed by Dr. Nicholas Lose and is awaiting receipt of Alpelisib.  She was seen with the interpreter today.  She reports that she has had recent onset facial swelling especially in the bilateral infraorbital areas and across the bridge of her nose.  She has also had a headache which she describes as being across the front of her skull and radiating to the top of  her skull.  She developed this pain and swelling after her lung biopsy on 01/04/2019.  She reported that the pain lasted initially for 2 weeks and then improved but now has recurred.  She denies fevers, chills, sweats, or visual changes.  She is scheduled to see Dr. Lindi Adie in follow-up on 02/03/2019.  She inquires about a head CT today.  Medications: I have reviewed the patient's current medications.  Allergies:  Allergies  Allergen Reactions   No Known Allergies     Past Medical History:  Diagnosis Date   Asthma    Cancer ([place])    breast   GERD (gastroesophageal reflux disease)    Hypertension    Pulmonary nodule     Past Surgical History:  Procedure Laterality Date   MASTECTOMY MODIFIED RADICAL Bilateral 02/11/2016   MASTECTOMY MODIFIED RADICAL Bilateral 02/11/2016   Procedure: BILATERAL MASTECTOMY MODIFIED RADICAL;  Surgeon: Rolm Bookbinder, MD;  Location: [place];  Service: General;  Laterality: Bilateral;   PORTACATH PLACEMENT N/A 09/04/2015   Procedure: INSERTION PORT-A-CATH WITH [place];  Surgeon: Rolm Bookbinder, MD;  Location: [place];  Service: General;  Laterality: N/A;   TUBAL LIGATION     VIDEO BRONCHOSCOPY Bilateral 01/04/2019   Procedure: VIDEO BRONCHOSCOPY WITH FLUORO;  Surgeon: Rigoberto Noel, MD;  Location: [place];  Service: Cardiopulmonary;  Laterality: Bilateral;    Family History  Problem Relation Age of Onset   Diabetes Mother     Social History   Socioeconomic History   Marital status: Married  Spouse name: Felicia [name]   Number of children: 3   Years of education: Not on file   Highest education level: Not on file  Occupational History   Not on file  Social Needs   Financial resource strain: Not on file   Food insecurity    Worry: Not on file    Inability: Not on file   Transportation needs    Medical: No    Non-medical: No  Tobacco Use   Smoking status: Never Smoker   Smokeless tobacco:  Never Used  Substance and Sexual Activity   Alcohol use: No   Drug use: No   Sexual activity: Yes    Birth control/protection: Surgical  Lifestyle   Physical activity    Days per week: Not on file    Minutes per session: Not on file   Stress: Not on file  Relationships   Social connections    Talks on phone: Not on file    Gets together: Not on file    Attends religious service: Not on file    Active member of club or organization: Not on file    Attends meetings of clubs or organizations: Not on file    Relationship status: Not on file   Intimate partner violence    Fear of current or ex partner: Not on file    Emotionally abused: Not on file    Physically abused: Not on file    Forced sexual activity: Not on file  Other Topics Concern   Not on file  Social History Narrative   3 children    [name] age 35, lives in [place]    [name] age 40 lives in [place]   [name] age 24 lives in [place]    Past Medical History, Surgical history, Social history, and Family history were reviewed and updated as appropriate.   Please see review of systems for further details on the patient's review from today.   Review of Systems:  Review of Systems  Constitutional: Negative for chills, diaphoresis and fever.  HENT: Negative for trouble swallowing and voice change.   Respiratory: Negative for cough, chest tightness, shortness of breath and wheezing.   Cardiovascular: Negative for chest pain and palpitations.  Gastrointestinal: Negative for abdominal pain, constipation, diarrhea, nausea and vomiting.  Musculoskeletal: Negative for back pain and myalgias.  Skin:       Bilateral infraorbital edema with swelling across the bridge of the nose.  Neurological: Positive for headaches. Negative for dizziness and light-headedness.    Objective:   Physical Exam:  BP (!) 143/91 (BP Location: Left Leg, Patient Position: Sitting) Comment: Notified Nurse of  Bp   Pulse 94    Temp 98.7 F (37.1 C) (Temporal)    Resp 18    Ht 5\' 1"  (1.549 m)    Wt 195 lb 6.4 oz (88.6 kg)    SpO2 96%    BMI 36.92 kg/m  ECOG: 1  Physical Exam Constitutional:      General: She is not in acute distress.    Appearance: She is not diaphoretic.  HENT:     Head: Normocephalic and atraumatic.     Nose:     Comments: Edema along the bridge of the nose. Eyes:     General: No scleral icterus.       Right eye: No discharge.        Left eye: No discharge.     Comments: Bilateral infraorbital edema.  Neck:  Musculoskeletal: Normal range of motion and neck supple. No neck rigidity or muscular tenderness.  Cardiovascular:     Rate and Rhythm: Normal rate and regular rhythm.     Heart sounds: Normal heart sounds. No murmur. No friction rub. No gallop.   Pulmonary:     Effort: Pulmonary effort is normal. No respiratory distress.     Breath sounds: Normal breath sounds. No wheezing or rales.  Lymphadenopathy:     Cervical: No cervical adenopathy.  Skin:    General: Skin is warm and dry.     Findings: No erythema or rash.  Neurological:     Mental Status: She is alert.     Coordination: Coordination normal.     Gait: Gait normal.     Lab Review:     Component Value Date/Time   NA 137 01/20/2019 0926   NA 137 04/14/2017 0919   K 3.7 01/20/2019 0926   K 3.7 04/14/2017 0919   CL 104 01/20/2019 0926   CO2 26 01/20/2019 0926   CO2 23 04/14/2017 0919   GLUCOSE 119 (H) 01/20/2019 0926   GLUCOSE 100 04/14/2017 0919   BUN 12 01/20/2019 0926   BUN 12.5 04/14/2017 0919   CREATININE 0.66 01/20/2019 0926   CREATININE 0.7 04/14/2017 0919   CALCIUM 10.1 01/20/2019 0926   CALCIUM 9.5 04/14/2017 0919   PROT 7.6 01/20/2019 0926   PROT 7.8 04/14/2017 0919   ALBUMIN 3.8 01/20/2019 0926   ALBUMIN 4.2 04/14/2017 0919   AST 38 01/20/2019 0926   AST 29 04/14/2017 0919   ALT 30 01/20/2019 0926   ALT 31 04/14/2017 0919   ALKPHOS 80 01/20/2019 0926   ALKPHOS 74  04/14/2017 0919   BILITOT 0.6 01/20/2019 0926   BILITOT 0.94 04/14/2017 0919   GFRNONAA >60 01/20/2019 0926   GFRAA >60 01/20/2019 0926       Component Value Date/Time   WBC 7.1 01/20/2019 0926   WBC 3.1 (L) 09/25/2017 0835   RBC 4.12 01/20/2019 0926   HGB 12.7 01/20/2019 0926   HGB 13.8 04/14/2017 0919   HCT 37.9 01/20/2019 0926   HCT 41.0 04/14/2017 0919   PLT 205 01/20/2019 0926   PLT 211 04/14/2017 0919   MCV 92.0 01/20/2019 0926   MCV 86.6 04/14/2017 0919   MCH 30.8 01/20/2019 0926   MCHC 33.5 01/20/2019 0926   RDW 12.4 01/20/2019 0926   RDW 13.2 04/14/2017 0919   LYMPHSABS 1.4 01/20/2019 0926   LYMPHSABS 1.7 04/14/2017 0919   MONOABS 0.5 01/20/2019 0926   MONOABS 0.5 04/14/2017 0919   EOSABS 0.1 01/20/2019 0926   EOSABS 0.1 04/14/2017 0919   BASOSABS 0.0 01/20/2019 0926   BASOSABS 0.0 04/14/2017 0919   -------------------------------  Imaging from last 24 hours (if applicable):  Radiology interpretation: Dg Femur Min 2 Views Left  Result Date: 01/20/2019 CLINICAL DATA:  Bilateral hip pain. History of metastatic breast carcinoma. No injury. EXAM: LEFT FEMUR 2 VIEWS COMPARISON:  None. FINDINGS: No fracture.  No bone lesion. Hip and knee joints are normally aligned. Soft tissues are unremarkable. IMPRESSION: Negative. Electronically Signed   By: Lajean Manes M.D.   On: 01/20/2019 16:07   Dg Femur, Min 2 Views Right  Result Date: 01/20/2019 CLINICAL DATA:  Pt c/o bilateral thigh pain, hx metastatic breast cancer, no injury. EXAM: RIGHT FEMUR 2 VIEWS COMPARISON:  None. FINDINGS: No fracture.  No bone lesion. Hip and knee joints are normally aligned. Soft tissues are unremarkable. IMPRESSION: Negative. Electronically Signed  By: Lajean Manes M.D.   On: 01/20/2019 16:06        This case was discussed with Dr. Lindi Adie. He expresses agreement with my management of this patient.

## 2019-02-01 NOTE — Telephone Encounter (Signed)
Oral Oncology Patient Advocate Encounter  I followed up with [employer] on the [place] shipment. Piqray has not been shipped yet, the patient needs to call and order the fill.  I called the patients daughter, Olin Hauser and left her a detailed message with the phone number to call and order the [place]. Ph # [phone] option 1.  Oral oncology clinic will continue to follow.  [place] Patient [place] Phone [phone] Fax [phone] 02/01/2019   3:12 PM

## 2019-02-02 ENCOUNTER — Ambulatory Visit (HOSPITAL_COMMUNITY)
Admission: RE | Admit: 2019-02-02 | Discharge: 2019-02-02 | Disposition: A | Discharge status: Home / Self Care | Payer: Self-pay | Source: Ambulatory Visit | Attending: Medical | Admitting: Medical

## 2019-02-02 ENCOUNTER — Other Ambulatory Visit: Payer: Self-pay

## 2019-02-02 DIAGNOSIS — R519 Headache, unspecified: Secondary | ICD-10-CM | POA: Insufficient documentation

## 2019-02-02 DIAGNOSIS — R600 Localized edema: Secondary | ICD-10-CM | POA: Insufficient documentation

## 2019-02-02 LAB — FUNGUS CULTURE RESULT

## 2019-02-02 LAB — FUNGUS CULTURE WITH STAIN

## 2019-02-02 LAB — FUNGAL ORGANISM REFLEX

## 2019-02-02 MED ORDER — HEPARIN SOD (PORK) LOCK FLUSH 100 UNIT/ML IV SOLN
500.0000 [IU] | Freq: Once | INTRAVENOUS | Status: AC
  Administered 2019-02-02: 500 [IU] via INTRAVENOUS

## 2019-02-02 MED ORDER — SODIUM CHLORIDE (PF) 0.9 % IJ SOLN
INTRAMUSCULAR | Status: AC
  Filled 2019-02-02: qty 50

## 2019-02-02 MED ORDER — IOHEXOL 300 MG/ML  SOLN
75.0000 mL | Freq: Once | INTRAMUSCULAR | Status: AC | PRN
  Administered 2019-02-02: 75 mL via INTRAVENOUS

## 2019-02-02 MED ORDER — HEPARIN SOD (PORK) LOCK FLUSH 100 UNIT/ML IV SOLN
INTRAVENOUS | Status: AC
  Filled 2019-02-02: qty 5

## 2019-02-02 NOTE — Progress Notes (Signed)
Patient Care Team: [name], MD as PCP - General (Family Medicine) Nicholas Lose, MD as Consulting Physician (Hematology and Oncology) Delice Bison, Charlestine Massed, NP as Nurse Practitioner (Hematology and Oncology) Kyung Rudd, MD as Consulting Physician (Radiation Oncology) Rolm Bookbinder, MD as Consulting Physician (General Surgery)  DIAGNOSIS:    ICD-10-CM   1. 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, estrogen receptor positive ([place])  C50.011    Z17.0    C50.012     SUMMARY OF ONCOLOGIC HISTORY: Oncology History  Bilateral breast cancer ([place])  08/16/2015 Mammogram   Left breast LIQ 4.2 cm, 4 mm oval mass 12:00, 1.2 cm mass left UOQ, enlarged left axillary lymph node   08/16/2015 Mammogram   Right breast UIQ: 3.9 cm irregular mass, 1.5 cm mass in the right LOQ, 9 mm mass right UOQ, 6 mm mass right LIQ, enlarged lymph node Rt axilla   08/20/2015 Initial Diagnosis   Right breast biopsy 1:00: IDC grade 2 ER 95%, PR 2%, Ki 67 15%; HER-2 negative ratio 1.04; 10:00: IDC grade 2-3 dissimilar to 1:00 biopsy 10:00: ER 95%, PR 0%, K67: 25%, HER-2 positive ratio 6.42 right axillary lymph node positive for metastatic cancer   08/20/2015 Procedure   Left biopsy 8:00: IDC with DCIS, grade 2; ER 95%, PR 95%, Ki-67 30%, HER-2 negative ratio 1.2; 12:00: IDC with DCIS, grade 1 ER 95%, PR 95%, Ki-67 10% HER-2 negative ratio 1.14 not similar to this biopsy, left axillary lymph node positive,    09/13/2015 - 01/17/2016 Neo-Adjuvant Chemotherapy   Neoadjuvant dose dense Adriamycin and Cytoxan followed by Taxol Herceptin Perjeta, followed by Herceptin maintenance   01/21/2016 Breast MRI   Bilateral breast masses significantly decreased in size with mild residual NME, complete resolution of numerous other nodules in both breasts, bilateral axillary lymph nodes and bilateral retropectoral lymph nodes decreased in size   02/11/2016 Surgery   Right mastectomy: IDC 2 nodules, 2.5, 1 cm,  margins negative, 3/7 lymph nodes positive T2 N1a stage II B, ER 95%, PR 2%, HER-2 negative, Ki-67 15% RCB burden 3.1 and 2.9 class II   02/11/2016 Surgery   Left mastectomy: IDC 3.1 cm with DCIS, margins negative, 0/8 lymph nodes, T2 N0 stage II a, RCB burden 2.18, class II, ER 100%, PR 95%, HER-2 negative ratio 1.2, Ki-67 30%   04/07/2016 - 05/15/2016 Radiation Therapy   Adjuvant radiation   06/26/2016 - 07/13/2017 Anti-estrogen oral therapy   Letrozole 2.5 mg daily   11/20/2016 Miscellaneous   Neratinib 240 mg daily discontinued 07/13/2017 when she presented with metastatic disease to the lungs   07/13/2017 Relapse/Recurrence   Progression of right and left hilar lymph nodes and bilateral lung nodules   07/13/2017 -  Anti-estrogen oral therapy   Ibrance with letrozole   10/24/2017 Imaging   CT chest abdomen pelvis: Interval decrease in the left hilar node.  The more superior left hilar node is slightly increased in size.  Interval decrease in the size of multiple lung nodules   04/26/2018 Imaging   Interval development of multiple lytic bone metastases.  Lesion in the left acetabulum measures 5.5 cm, new lesion L3 extending to the left pedicle.  Perihilar nodule left lower lobe increased in size 2.8 cm from 1.8 cm, other nodules not changed.  Similar appearance of left hilar adenopathy, small perinephric soft tissue nodule increased in size   05/07/2018 - 05/19/2018 Radiation Therapy   Radiation to L3 (30 Gy in 10 Fx)   01/04/2019 Progression  Bronchoscopic lung biopsy (Dr. Clyda Greener): metastatic carcinoma, HER-2 negative by FISH, ER+ 50%, PR+ 10%.     CHIEF COMPLIANT: Follow-up of metastatic breast cancer on Faslodex with Alpelisib  INTERVAL HISTORY: Felicia [name] is a 58 y.o. with above-mentioned history of metastatic breast cancer currently on treatment with Faslodex with Alpelisib. Head CT on 02/02/19 showed a 1cm enhancing lesion in the right occipital lobe and left  posterior temporal lobe compatible with metastatic disease.  She presents to the clinic today for a toxicity check and to review her scan.   REVIEW OF SYSTEMS:   Constitutional: Denies fevers, chills or abnormal weight loss Eyes: Denies blurriness of vision Ears, nose, mouth, throat, and face: Denies mucositis or sore throat Respiratory: Denies cough, dyspnea or wheezes Cardiovascular: Denies palpitation, chest discomfort Gastrointestinal: Denies nausea, heartburn or change in bowel habits Skin: Denies abnormal skin rashes Lymphatics: Denies new lymphadenopathy or easy bruising Neurological: Denies numbness, tingling or new weaknesses Behavioral/Psych: Mood is stable, no new changes  Extremities: No lower extremity edema Breast: denies any pain or lumps or nodules in either breasts All other systems were reviewed with the patient and are negative.  I have reviewed the past medical history, past surgical history, social history and family history with the patient and they are unchanged from previous note.  ALLERGIES:  is allergic to no known allergies.  MEDICATIONS:  Current Outpatient Medications  Medication Sig Dispense Refill  . albuterol (VENTOLIN HFA) 108 (90 Base) MCG/ACT inhaler Inhale 2 puffs into the lungs every 4 (four) hours as needed for wheezing or shortness of breath. 1 Inhaler 5  . alpelisib (PIQRAY '300MG'$  DAILY DOSE) 2 x 150 MG Therapy Pack Take two '150mg'$  tablets with food at the same time daily. Swallow whole, do not crush, chew, or split. 28 each 0  . Calcium Carb-Cholecalciferol (CALCIUM 600+D) 600-800 MG-UNIT TABS Take 1 tablet by mouth daily.    . Cholecalciferol (VITAMIN D-3) 5000 units TABS Take 1 tablet by mouth daily.    . furosemide (LASIX) 20 MG tablet 20 mg once daily for 4 days for swelling, stop then restart if swelling returns 30 tablet 0  . letrozole (FEMARA) 2.5 MG tablet Take 1 tablet (2.5 mg total) by mouth daily. 90 tablet 3  . lidocaine-prilocaine  (EMLA) cream Apply 1 application topically as needed (Apply 2- hours prior to access of port). 30 g 6  . lisinopril (PRINIVIL,ZESTRIL) 20 MG tablet Take 1 tablet (20 mg total) by mouth daily. 30 tablet 6  . metFORMIN (GLUCOPHAGE XR) 500 MG 24 hr tablet Take 1 tablet (500 mg total) by mouth daily with breakfast. 30 tablet 2  . oxyCODONE-acetaminophen (PERCOCET/ROXICET) 5-325 MG tablet Take 1 tablet by mouth every 6 (six) hours as needed for severe pain. 60 tablet 0  . pantoprazole (PROTONIX) 40 MG tablet Take 1 tablet (40 mg total) by mouth daily. 30 tablet 3   No current facility-administered medications for this visit.    Facility-Administered Medications Ordered in Other Visits  Medication Dose Route Frequency Provider Last Rate Last Dose  . sodium chloride flush (NS) 0.9 % injection 10 mL  10 mL Intravenous PRN Nicholas Lose, MD   10 mL at 04/14/17 0831    PHYSICAL EXAMINATION: ECOG PERFORMANCE STATUS: 1 - Symptomatic but completely ambulatory  There were no vitals filed for this visit. There were no vitals filed for this visit.  GENERAL: alert, no distress and comfortable SKIN: skin color, texture, turgor are normal, no rashes or  significant lesions EYES: normal, Conjunctiva are pink and non-injected, sclera clear OROPHARYNX: no exudate, no erythema and lips, buccal mucosa, and tongue normal  NECK: supple, thyroid normal size, non-tender, without nodularity LYMPH: no palpable lymphadenopathy in the cervical, axillary or inguinal LUNGS: clear to auscultation and percussion with normal breathing effort HEART: regular rate & rhythm and no murmurs and no lower extremity edema ABDOMEN: abdomen soft, non-tender and normal bowel sounds MUSCULOSKELETAL: no cyanosis of digits and no clubbing  NEURO: alert & oriented x 3 with fluent speech, no focal motor/sensory deficits EXTREMITIES: No lower extremity edema  LABORATORY DATA:  I have reviewed the data as listed CMP Latest Ref Rng & Units  01/20/2019 12/24/2018 11/19/2018  Glucose 70 - 99 mg/dL 119(H) 105(H) 118(H)  BUN 6 - 20 mg/dL '12 13 12  '$ Creatinine 0.44 - 1.00 mg/dL 0.66 0.69 0.73  Sodium 135 - 145 mmol/L 137 140 137  Potassium 3.5 - 5.1 mmol/L 3.7 4.0 3.8  Chloride 98 - 111 mmol/L 104 103 104  CO2 22 - 32 mmol/L '26 26 25  '$ Calcium 8.9 - 10.3 mg/dL 10.1 9.8 9.4  Total Protein 6.5 - 8.1 g/dL 7.6 7.6 7.5  Total Bilirubin 0.3 - 1.2 mg/dL 0.6 0.6 0.6  Alkaline Phos 38 - 126 U/L 80 69 63  AST 15 - 41 U/L 38 37 37  ALT 0 - 44 U/L '30 31 30    '$ Lab Results  Component Value Date   WBC 7.1 01/20/2019   HGB 12.7 01/20/2019   HCT 37.9 01/20/2019   MCV 92.0 01/20/2019   PLT 205 01/20/2019   NEUTROABS 5.1 01/20/2019    ASSESSMENT & PLAN:  Bilateral breast cancer ([place]) Treatment Summary: 1. Neoadjuvant chemotherapy with either AC followed by THP started 09/13/2015 and completed 12 weeks of Taxol 11/29/2015, Herceptin Perjeta 6; now on Herceptin  2. Bilateral mastectomies and lymph node surgery 02/11/16 3. Adjuvant radiation 04/07/2016 to 05/15/2016 4. Adjuvant antiestrogen therapy with Letrozole started 06/26/2016 5. Neratinib after Herceptin maintenancediscontinued 07/13/2017 when she developed metastatic disease 6.Ibrance with letrozole started March 2019 stopped 04/29/2018 due to progression 7. Palliative radiation L2-4 and left pelvis completed 05/19/2018  [facility] molecular test: ER positive, PR positive, PI K3CA mutation present, HER-2 negative ER positive, TMB high 19 mutations, PDL 1 -0% ______________________________________________________________________________________ Bronchoscopy and biopsy 01/04/2019: Poorly differentiated adenocarcinoma ER 50% positive, PR 10%, HER-2 equivocal by IHC 2+, FISH negative ratio 1.03  Current treatment:Faslodex with Alpelisib (will be started in 1 to 2 days) Swelling of the face: CT of the head was performed on 02/02/2019 which showed two 1 cm brain lesions without edema. Plan: 1.   Appointment radiation oncology for radiation to the brain lesions as well as her back. 2.  Start Faslodex with the Alpelisib as soon as the Alpelisib becomes available. 3.  Bone metastases: Continue with every 36-monthXgeva with calcium and vitamin D.  Goals of care: I discussed with her that if she continues to progress at the same pace then she has very poor prognosis which can lead to her death. Return to clinic along with her injection appointment in a couple of weeks  No orders of the defined types were placed in this encounter.  The patient has a good understanding of the overall plan. she agrees with it. she will call with any problems that may develop before the next visit here.  GNicholas Lose MD 02/03/2019  IJulious OkaDorshimer am acting as scribe for Dr. VNicholas Lose  I have  reviewed the above documentation for accuracy and completeness, and I agree with the above.

## 2019-02-03 ENCOUNTER — Inpatient Hospital Stay: Payer: Self-pay

## 2019-02-03 ENCOUNTER — Other Ambulatory Visit: Payer: Self-pay | Admitting: Radiation Therapy

## 2019-02-03 ENCOUNTER — Other Ambulatory Visit: Payer: Self-pay | Admitting: *Deleted

## 2019-02-03 ENCOUNTER — Other Ambulatory Visit: Payer: Self-pay

## 2019-02-03 ENCOUNTER — Inpatient Hospital Stay (HOSPITAL_BASED_OUTPATIENT_CLINIC_OR_DEPARTMENT_OTHER): Payer: Self-pay | Admitting: Hematology and Oncology

## 2019-02-03 ENCOUNTER — Telehealth: Payer: Self-pay | Admitting: Radiation Oncology

## 2019-02-03 VITALS — BP 143/84 | HR 65 | Temp 98.9°F | Resp 17 | Ht 61.0 in | Wt 194.1 lb

## 2019-02-03 DIAGNOSIS — Z170 Estrogen receptor positive status [ER+]: Secondary | ICD-10-CM

## 2019-02-03 DIAGNOSIS — C50012 Malignant neoplasm of nipple and areola, left female breast: Secondary | ICD-10-CM

## 2019-02-03 DIAGNOSIS — C50011 Malignant neoplasm of nipple and areola, right female breast: Secondary | ICD-10-CM

## 2019-02-03 DIAGNOSIS — C7931 Secondary malignant neoplasm of brain: Secondary | ICD-10-CM

## 2019-02-03 DIAGNOSIS — Z95828 Presence of other vascular implants and grafts: Secondary | ICD-10-CM

## 2019-02-03 DIAGNOSIS — C7949 Secondary malignant neoplasm of other parts of nervous system: Secondary | ICD-10-CM

## 2019-02-03 DIAGNOSIS — C7800 Secondary malignant neoplasm of unspecified lung: Secondary | ICD-10-CM

## 2019-02-03 LAB — CMP (CANCER CENTER ONLY)
ALT: 25 U/L (ref 0–44)
AST: 31 U/L (ref 15–41)
Albumin: 3.4 g/dL — ABNORMAL LOW (ref 3.5–5.0)
Alkaline Phosphatase: 80 U/L (ref 38–126)
Anion gap: 7 (ref 5–15)
BUN: 9 mg/dL (ref 6–20)
CO2: 26 mmol/L (ref 22–32)
Calcium: 9.1 mg/dL (ref 8.9–10.3)
Chloride: 103 mmol/L (ref 98–111)
Creatinine: 0.62 mg/dL (ref 0.44–1.00)
GFR, Est AFR Am: >60 mL/min (ref >60)
GFR, Estimated: >60 mL/min (ref >60)
Glucose, Bld: 117 mg/dL — ABNORMAL HIGH (ref 70–99)
Potassium: 3.6 mmol/L (ref 3.5–5.1)
Sodium: 136 mmol/L (ref 135–145)
Total Bilirubin: 0.6 mg/dL (ref 0.3–1.2)
Total Protein: 7.5 g/dL (ref 6.5–8.1)

## 2019-02-03 LAB — CBC WITH DIFFERENTIAL (CANCER CENTER ONLY)
Abs Immature Granulocytes: 0.04 10*3/uL (ref 0.00–0.07)
Basophils Absolute: 0 10*3/uL (ref 0.0–0.1)
Basophils Relative: 0 %
Eosinophils Absolute: 0.1 10*3/uL (ref 0.0–0.5)
Eosinophils Relative: 1 %
HCT: 36 % (ref 36.0–46.0)
Hemoglobin: 11.9 g/dL — ABNORMAL LOW (ref 12.0–15.0)
Immature Granulocytes: 1 %
Lymphocytes Relative: 19 %
Lymphs Abs: 1.4 10*3/uL (ref 0.7–4.0)
MCH: 29.5 pg (ref 26.0–34.0)
MCHC: 33.1 g/dL (ref 30.0–36.0)
MCV: 89.3 fL (ref 80.0–100.0)
Monocytes Absolute: 0.5 10*3/uL (ref 0.1–1.0)
Monocytes Relative: 7 %
Neutro Abs: 5.1 10*3/uL (ref 1.7–7.7)
Neutrophils Relative %: 72 %
Platelet Count: 235 10*3/uL (ref 150–400)
RBC: 4.03 MIL/uL (ref 3.87–5.11)
RDW: 12.3 % (ref 11.5–15.5)
WBC Count: 7.1 10*3/uL (ref 4.0–10.5)
nRBC: 0 % (ref 0.0–0.2)

## 2019-02-03 MED ORDER — HEPARIN SOD (PORK) LOCK FLUSH 100 UNIT/ML IV SOLN
500.0000 [IU] | Freq: Once | INTRAVENOUS | Status: AC | PRN
  Administered 2019-02-03: 09:00:00 500 [IU] via INTRAVENOUS
  Filled 2019-02-03: qty 5

## 2019-02-03 MED ORDER — SODIUM CHLORIDE 0.9% FLUSH
10.0000 mL | INTRAVENOUS | Status: DC | PRN
  Administered 2019-02-03: 10 mL via INTRAVENOUS
  Filled 2019-02-03: qty 10

## 2019-02-03 NOTE — Assessment & Plan Note (Signed)
Treatment Summary: 1. Neoadjuvant chemotherapy with either AC followed by THP started 09/13/2015 and completed 12 weeks of Taxol 11/29/2015, Herceptin Perjeta 6; now on Herceptin  2. Bilateral mastectomies and lymph node surgery 02/11/16 3. Adjuvant radiation 04/07/2016 to 05/15/2016 4. Adjuvant antiestrogen therapy with Letrozole started 06/26/2016 5. Neratinib after Herceptin maintenancediscontinued 07/13/2017 when she developed metastatic disease 6.Ibrance with letrozole started March 2019 stopped 04/29/2018 due to progression 7. Palliative radiation L2-4 and left pelvis completed 05/19/2018  [facility] molecular test: ER positive, PR positive, PI K3CA mutation present, HER-2 negative ER positive, TMB high 19 mutations, PDL 1 -0% ______________________________________________________________________________________ Bronchoscopy and biopsy 01/04/2019: Poorly differentiated adenocarcinoma ER 50% positive, PR 10%, HER-2 equivocal by IHC 2+, FISH negative ratio 1.03  Current treatment:Faslodex with Alpelisib Alpelisib Toxicities:

## 2019-02-03 NOTE — Telephone Encounter (Signed)
New message:    LVM for patient to return call via translator for the patient to call back and schedule appt per referral received.

## 2019-02-04 ENCOUNTER — Telehealth: Payer: Self-pay | Admitting: Hematology and Oncology

## 2019-02-04 NOTE — Telephone Encounter (Signed)
I left a message on the daughters number regarding schedule

## 2019-02-04 NOTE — Telephone Encounter (Signed)
Oral Oncology Patient Advocate Encounter  [employer] called and stated that they have still not been able to reach the patient to schedule her shipment.  I called the patients daughter, Olin Hauser and left another detailed message.  Oral Oncology clinic will continue to follow.  [place] Patient [place] Phone [phone] Fax [phone] 02/04/2019   1:12 PM

## 2019-02-07 ENCOUNTER — Telehealth: Payer: Self-pay | Admitting: Radiation Oncology

## 2019-02-07 NOTE — Telephone Encounter (Signed)
New message:   LVM for patient's daughter to have patient return our call to schedule appt from referral received

## 2019-02-08 ENCOUNTER — Other Ambulatory Visit: Payer: Self-pay | Admitting: Hematology and Oncology

## 2019-02-08 DIAGNOSIS — C7951 Secondary malignant neoplasm of bone: Secondary | ICD-10-CM

## 2019-02-08 DIAGNOSIS — C50011 Malignant neoplasm of nipple and areola, right female breast: Secondary | ICD-10-CM

## 2019-02-09 ENCOUNTER — Ambulatory Visit: Payer: Self-pay | Admitting: Radiation Oncology

## 2019-02-09 ENCOUNTER — Telehealth: Payer: Self-pay

## 2019-02-09 NOTE — Telephone Encounter (Signed)
Oral Oncology Patient Advocate Encounter  Confirmed with [employer] that Felicia [name] was delivered to the patient on 10/9.  [place] Patient [place] Phone [phone] Fax [phone] 02/09/2019   3:19 PM

## 2019-02-09 NOTE — Telephone Encounter (Signed)
Entry for 02/08/2019 per Dr. Clifton James Monguilod:   Message left for daughter to introduce Palliative Care and to offer to schedule a visit with NP. Contact info provided

## 2019-02-09 NOTE — Telephone Encounter (Signed)
Note entered for Dr. Clifton James Monguilod: I spoke with this patient's siter (Ms. Socorro Esquivel) who came from [place] to help her sister. I explained why we were calling, what palliative care is and how it can help her sister and family. I gave her my office number so the patient's daughter can call me and help to set up a date and time for a palliative care visit. Ms. Mellissa Kohut could make any decision regarding her sister's health although she sounded supportive with the idea of meeting with our service. She describes that her sister has some pain when standing and she welcome [place] to help with her sister's pain in the near future.

## 2019-02-09 NOTE — Telephone Encounter (Signed)
VM left for daughter to schedule visit with Palliative Care by Dr. Alferd Patee.

## 2019-02-09 NOTE — Telephone Encounter (Signed)
Phone call placed to patient's home phone. Unable to leave a message as voice mail was full

## 2019-02-10 ENCOUNTER — Ambulatory Visit
Admission: RE | Admit: 2019-02-10 | Discharge: 2019-02-10 | Disposition: A | Discharge status: Home / Self Care | Payer: Self-pay | Source: Ambulatory Visit | Attending: Radiation Oncology | Admitting: Radiation Oncology

## 2019-02-10 ENCOUNTER — Other Ambulatory Visit: Payer: Self-pay

## 2019-02-10 DIAGNOSIS — C7951 Secondary malignant neoplasm of bone: Secondary | ICD-10-CM

## 2019-02-10 DIAGNOSIS — C50012 Malignant neoplasm of nipple and areola, left female breast: Secondary | ICD-10-CM

## 2019-02-10 DIAGNOSIS — C7931 Secondary malignant neoplasm of brain: Secondary | ICD-10-CM | POA: Insufficient documentation

## 2019-02-10 DIAGNOSIS — C7949 Secondary malignant neoplasm of other parts of nervous system: Secondary | ICD-10-CM

## 2019-02-10 DIAGNOSIS — C50011 Malignant neoplasm of nipple and areola, right female breast: Secondary | ICD-10-CM

## 2019-02-10 DIAGNOSIS — Z170 Estrogen receptor positive status [ER+]: Secondary | ICD-10-CM

## 2019-02-10 NOTE — Progress Notes (Signed)
Radiation Oncology         [phone] ________________________________  ReConsultation - Conducted via telephone due to current COVID-19 concerns for limiting patient exposure  I spoke with the patient to conduct this consult visit via telephone to spare the patient unnecessary potential exposure in the healthcare setting during the current COVID-19 pandemic. The patient was notified in advance and was offered a [place] meeting to allow for face to face communication but unfortunately reported that they did not have the appropriate resources/technology to support such a visit and instead preferred to proceed with a telephone reconsult.  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  04/07/16 - 05/27/16: Left Chest Wall treated to 50.4 Gy in 28 fractions, and then Boosted an additional 10 Gy in 5 fractions. Right Chest Wall treated to 50.4 Gy in 28 fractions, and then Boosted an additional 10 Gy in 5 fractions.   Narrative: In summary this is a pleasant 58 y.o. [place] speaking female with a history of bilateral ER positive breast cancer. She was treated with definitive therapy including radiotherapy to bilateral chest walls and regional nodes which she completed in January 2018. She was found ot have recurrent disease in March 2019 with disease in her lungs and hilar nodes. She had been on Letrozole and Ibrance though a scan on 04/26/18 revealed stability of her disease in the chest since 09/2017 scan, but had a 2.7 x 3.5 x 5.5 cm lytic lesion in the left acetabulum and a 2.4 cm lesion in the L3 vertebral body extending toward the  left pedicle. She went on to receive palliative radiotherapy to these sites in January 2020. PET in August 2020 revealed persistent lung, nodal, and bone disease. She has been recently placed on Piqray oral therapy along with Faslodex. She recently found that her forehead started to become swollen and more full. A CT of the head with and without contrast on 02/02/2019 revealed multiple lesions in the skull particularly with a full thickeness lesion measuring 2.2 cm  in the frontal bone and a 1 cm lesion in the right occipital lobe and left posterior temporal lobe. She is contacted by phone to coordinate treatment to the T spine and right femur.         On review of systems, the patient reports that she is doing well overall but has noticed fullness in her forehead that is nontender. She denies any headaches or visual changes. She denies any numbness in her chest or extremities but has had pain in her right thigh and in her mid back between her shoulder blades at the level of her bra strap. She denies any lower back pain. She denies any sternal chest pain, shortness of breath, cough, fevers, chills, night sweats, unintended weight changes. She denies any bowel or bladder disturbances, and denies abdominal pain, nausea or vomiting. She denies any new musculoskeletal or joint aches or pains, new skin lesions or concerns. A complete review of systems is obtained and is otherwise negative.  Past Medical History:  Past Medical History:  Diagnosis Date   Asthma    Cancer ([place])    breast   GERD (  gastroesophageal reflux disease)    Hypertension    Pulmonary nodule     Past Surgical History: Past Surgical History:  Procedure Laterality Date   MASTECTOMY MODIFIED RADICAL Bilateral 02/11/2016   MASTECTOMY MODIFIED RADICAL Bilateral 02/11/2016   Procedure: BILATERAL MASTECTOMY MODIFIED RADICAL;  Surgeon: Rolm Bookbinder, MD;  Location: [facility];  Service: General;  Laterality: Bilateral;   PORTACATH  PLACEMENT N/A 09/04/2015   Procedure: INSERTION PORT-A-CATH WITH [place];  Surgeon: Rolm Bookbinder, MD;  Location: [facility];  Service: General;  Laterality: N/A;   TUBAL LIGATION     VIDEO BRONCHOSCOPY Bilateral 01/04/2019   Procedure: VIDEO BRONCHOSCOPY WITH FLUORO;  Surgeon: Rigoberto Noel, MD;  Location: [facility];  Service: Cardiopulmonary;  Laterality: Bilateral;    Social History:  Social History   Socioeconomic History   Marital status: Married    Spouse name: Conservation officer, nature Real   Number of children: 3   Years of education: Not on file   Highest education level: Not on file  Occupational History   Not on file  Social Needs   Financial resource strain: Not on file   Food insecurity    Worry: Not on file    Inability: Not on file   Transportation needs    Medical: No    Non-medical: No  Tobacco Use   Smoking status: Never Smoker   Smokeless tobacco: Never Used  Substance and Sexual Activity   Alcohol use: No   Drug use: No   Sexual activity: Yes    Birth control/protection: Surgical  Lifestyle   Physical activity    Days per week: Not on file    Minutes per session: Not on file   Stress: Not on file  Relationships   Social connections    Talks on phone: Not on file    Gets together: Not on file    Attends religious service: Not on file    Active member of club or organization: Not on file    Attends meetings of clubs or organizations: Not on file    Relationship status: Not on file   Intimate partner violence    Fear of current or ex partner: Not on file    Emotionally abused: Not on file    Physically abused: Not on file    Forced sexual activity: Not on file  Other Topics Concern   Not on file  Social History Narrative   3 children    [name] age 14, lives in [place]    [name] age 47 lives in [place]   [name] age 50 lives in [place]    Family History: Family History  Problem  Relation Age of Onset   Diabetes Mother      ALLERGIES:  is allergic to no known allergies.  Meds: Current Outpatient Medications  Medication Sig Dispense Refill   albuterol (VENTOLIN HFA) 108 (90 Base) MCG/ACT inhaler Inhale 2 puffs into the lungs every 4 (four) hours as needed for wheezing or shortness of breath. 1 Inhaler 5   alpelisib (PIQRAY 300MG  DAILY DOSE) 2 x 150 MG Therapy Pack Take two 150mg  tablets with food at the same time daily. Swallow whole, do not crush, chew, or split. 28 each 0   Calcium Carb-Cholecalciferol (CALCIUM 600+D) 600-800 MG-UNIT TABS Take 1 tablet by mouth daily.     Cholecalciferol (VITAMIN D-3) 5000 units TABS Take 1 tablet by mouth daily.     furosemide (LASIX) 20 MG tablet 20 mg once  daily for 4 days for swelling, stop then restart if swelling returns 30 tablet 0   letrozole (FEMARA) 2.5 MG tablet Take 1 tablet (2.5 mg total) by mouth daily. 90 tablet 3   lidocaine-prilocaine (EMLA) cream Apply 1 application topically as needed (Apply 2- hours prior to access of port). 30 g 6   lisinopril (PRINIVIL,ZESTRIL) 20 MG tablet Take 1 tablet (20 mg total) by mouth daily. 30 tablet 6   metFORMIN (GLUCOPHAGE XR) 500 MG 24 hr tablet Take 1 tablet (500 mg total) by mouth daily with breakfast. 30 tablet 2   oxyCODONE-acetaminophen (PERCOCET/ROXICET) 5-325 MG tablet Take 1 tablet by mouth every 6 (six) hours as needed for severe pain. 60 tablet 0   pantoprazole (PROTONIX) 40 MG tablet Take 1 tablet (40 mg total) by mouth daily. 30 tablet 3   No current facility-administered medications for this encounter.    Facility-Administered Medications Ordered in Other Encounters  Medication Dose Route Frequency Provider Last Rate Last Dose   sodium chloride flush (NS) 0.9 % injection 10 mL  10 mL Intravenous PRN Nicholas Lose, MD   10 mL at 04/14/17 0831    Physical Findings: Unable to assess due to encounter type.  Lab Findings: Lab Results  Component Value  Date   WBC 7.1 02/03/2019   HGB 11.9 (L) 02/03/2019   HCT 36.0 02/03/2019   MCV 89.3 02/03/2019   PLT 235 02/03/2019     Radiographic Findings: Ct Head W Wo Contrast  Result Date: 02/03/2019 CLINICAL DATA:  Metastatic breast cancer EXAM: CT HEAD WITHOUT AND WITH CONTRAST TECHNIQUE: Contiguous axial images were obtained from the base of the skull through the vertex without and with intravenous contrast CONTRAST:  29mL OMNIPAQUE IOHEXOL 300 MG/ML  SOLN COMPARISON:  None. FINDINGS: Brain: Ventricle size normal. Negative for acute infarct. Negative for acute hemorrhage. 1 cm enhancing lesion right posterior occipital parietal lobe without significant edema. 1 cm enhancing lesion left posterior temporal lobe without significant edema. These lesions are highly suspicious for metastatic disease. Vascular: Negative for hyperdense vessel Skull: Multiple lytic skull lesions compatible with widespread bony metastatic disease. Large 2.2 cm lytic lesion overlying the torcular. No significant intracranial extension of bony tumor. Paranasal sinuses clear.  Negative orbit. IMPRESSION: 1 cm enhancing lesions right occipital parietal lobe, and left posterior temporal lobe compatible with metastatic disease. 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 Electronically Signed   By: Franchot Gallo M.D.   On: 02/03/2019 08:16   Dg Femur Min 2 Views Left  Result Date: 01/20/2019 CLINICAL DATA:  Bilateral hip pain. History of metastatic breast carcinoma. No injury. EXAM: LEFT FEMUR 2 VIEWS COMPARISON:  None. FINDINGS: No fracture.  No bone lesion. Hip and knee joints are normally aligned. Soft tissues are unremarkable. IMPRESSION: Negative. Electronically Signed   By: Lajean Manes M.D.   On: 01/20/2019 16:07   Dg Femur, Min 2 Views Right  Result Date: 01/20/2019 CLINICAL DATA:  Pt c/o bilateral thigh pain, hx metastatic breast cancer, no injury. EXAM: RIGHT FEMUR 2 VIEWS COMPARISON:  None. FINDINGS:  No fracture.  No bone lesion. Hip and knee joints are normally aligned. Soft tissues are unremarkable. IMPRESSION: Negative. Electronically Signed   By: Lajean Manes M.D.   On: 01/20/2019 16:06    Impression/Plan: 1. Recurrent metastatic Bilateral ER positivebreast cancer. Dr. Lisbeth Renshaw discusses the patient's course and would offer her palliaitive radiotherapy to the right femur, her frontal bone lesion, and T7 lesion. She may also be  a candidate for stereotactic radiosurgery to the brain. Her brain MRI will help to determine this which she is scheduled for tomorrow. We will follow up with these results when available. We discussed the risks, benefits, short, and long term effects of radiotherapy, and the patient is interested in proceeding. Dr. Lisbeth Renshaw discusses the delivery and logistics of radiotherapy and anticipates a course of 2 weeks of radiotherapy to the right femur, T7 spine, and frontal bone. Written consent will be obtained tomorrow with the assistance of a spanish speaking interpretor by webex.  2. Pain secondary to #1. She will continue her current pain regimen for now. We will follow this expectantly.   Given current concerns for patient exposure during the COVID-19 pandemic, this encounter was conducted via telephone.  The patient has given verbal consent for this type of encounter. The time spent during this encounter was 60 minutes and 50% of that time was spent in the coordination of her care. The attendants for this meeting include Dr. Lisbeth Renshaw, Shona Simpson, [facility], Vicente Males interpretor from [place],  [place] and her daughter Lavell Anchors. During the encounter, Jonnie Finner from [employer] was located remotely in her office.  Toney Sang Dorice Lamas and her daughter Lavell Anchors were located at home. Dr. Lisbeth Renshaw and Shona Simpson [facility] were located at [facility] Radiation Oncology Department.    The above  documentation reflects my direct findings during this shared patient visit. Please see the separate note by Dr. Lisbeth Renshaw on this date for the remainder of the patient's plan of care.     Carola Rhine, PAC

## 2019-02-11 ENCOUNTER — Other Ambulatory Visit: Payer: Self-pay

## 2019-02-11 ENCOUNTER — Ambulatory Visit
Admission: RE | Admit: 2019-02-11 | Discharge: 2019-02-11 | Disposition: A | Discharge status: Home / Self Care | Payer: Self-pay | Source: Ambulatory Visit | Attending: Radiation Oncology | Admitting: Radiation Oncology

## 2019-02-11 ENCOUNTER — Telehealth: Payer: Self-pay

## 2019-02-11 DIAGNOSIS — C50919 Malignant neoplasm of unspecified site of unspecified female breast: Secondary | ICD-10-CM | POA: Insufficient documentation

## 2019-02-11 DIAGNOSIS — C7931 Secondary malignant neoplasm of brain: Secondary | ICD-10-CM

## 2019-02-11 DIAGNOSIS — C7951 Secondary malignant neoplasm of bone: Secondary | ICD-10-CM | POA: Insufficient documentation

## 2019-02-11 DIAGNOSIS — Z510 Encounter for antineoplastic radiation therapy: Secondary | ICD-10-CM | POA: Insufficient documentation

## 2019-02-11 DIAGNOSIS — C7949 Secondary malignant neoplasm of other parts of nervous system: Secondary | ICD-10-CM

## 2019-02-11 MED ORDER — GADOBENATE DIMEGLUMINE 529 MG/ML IV SOLN
18.0000 mL | Freq: Once | INTRAVENOUS | Status: AC | PRN
  Administered 2019-02-11: 18 mL via INTRAVENOUS

## 2019-02-11 NOTE — Telephone Encounter (Signed)
Note entered for Dr. Konrad Dolores:    VM left for patient's daughter to schedule visit with Palliative Care. Contact information also left. Message left in [place].

## 2019-02-14 ENCOUNTER — Telehealth: Payer: Self-pay | Admitting: Radiation Oncology

## 2019-02-14 ENCOUNTER — Other Ambulatory Visit: Payer: Self-pay | Admitting: Radiation Therapy

## 2019-02-14 DIAGNOSIS — C7931 Secondary malignant neoplasm of brain: Secondary | ICD-10-CM

## 2019-02-14 DIAGNOSIS — C7949 Secondary malignant neoplasm of other parts of nervous system: Secondary | ICD-10-CM

## 2019-02-14 NOTE — Telephone Encounter (Signed)
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 We were unable to reach her but will try back again tomorrow.

## 2019-02-15 ENCOUNTER — Telehealth: Payer: Self-pay | Admitting: Radiation Oncology

## 2019-02-15 NOTE — Telephone Encounter (Signed)
LM again with daughter with the help of interpreter services to give her MRI results and review updates in her treatment plan. The [facility] staff will page me back to the  Machine to review tomorrow prior to any treatment as well.

## 2019-02-16 ENCOUNTER — Encounter: Payer: Self-pay | Admitting: Radiation Oncology

## 2019-02-16 ENCOUNTER — Other Ambulatory Visit: Payer: Self-pay

## 2019-02-16 ENCOUNTER — Ambulatory Visit
Admission: RE | Admit: 2019-02-16 | Discharge: 2019-02-16 | Disposition: A | Discharge status: Home / Self Care | Payer: Self-pay | Source: Ambulatory Visit | Attending: Radiation Oncology | Admitting: Radiation Oncology

## 2019-02-16 LAB — ACID FAST CULTURE WITH REFLEXED SENSITIVITIES (MYCOBACTERIA): Acid Fast Culture: NEGATIVE

## 2019-02-16 NOTE — Progress Notes (Signed)
I spoke with the patient and let her know the MRI findings and discussion to proceed with whole brain radiation. I consented her with the help of the spanish speaking interpreter to proceed with whole brain radiotherapy given the concerns for leptomeningeal spread of disease. She is to see Dr. Lindi Adie later this week to further discuss plans for systemic therapy.

## 2019-02-17 ENCOUNTER — Telehealth: Payer: Self-pay

## 2019-02-17 ENCOUNTER — Ambulatory Visit
Admission: RE | Admit: 2019-02-17 | Discharge: 2019-02-17 | Disposition: A | Discharge status: Home / Self Care | Payer: Self-pay | Source: Ambulatory Visit | Attending: Radiation Oncology | Admitting: Radiation Oncology

## 2019-02-17 ENCOUNTER — Other Ambulatory Visit: Payer: Self-pay

## 2019-02-17 NOTE — Telephone Encounter (Signed)
Note entered on behalf of Dr. Clifton James [place]od:  Phone call placed to patient's daughter, Olin Hauser and shared with her  we were invited to her mother's case and how palliative care could help her mother. She was receptive to our services and support although she mentioned that Ms. Elveria Royals just started radiation therapy at the [facility]. and she was very tired and preferred to wait until she done with her radiation treatments (10 treatments plan). I gave her our [place] office number to call us if she need [place] before radiation therapy is finished. Otherwise we plan to reconnect as soon as she is done with [facility].

## 2019-02-17 NOTE — Progress Notes (Signed)
Patient Care Team: [name], MD as PCP - General (Family Medicine) Nicholas Lose, MD as Consulting Physician (Hematology and Oncology) Delice Bison, Charlestine Massed, NP as Nurse Practitioner (Hematology and Oncology) Kyung Rudd, MD as Consulting Physician (Radiation Oncology) Rolm Bookbinder, MD as Consulting Physician (General Surgery)  DIAGNOSIS:    ICD-10-CM   1. 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, estrogen receptor positive ([place])  08/16/2015 Mammogram   Left breast LIQ 4.2 cm, 4 mm oval mass 12:00, 1.2 cm mass left UOQ, enlarged left axillary lymph node   08/16/2015 Mammogram   Right breast UIQ: 3.9 cm irregular mass, 1.5 cm mass in the right LOQ, 9 mm mass right UOQ, 6 mm mass right LIQ, enlarged lymph node Rt axilla   08/20/2015 Initial Diagnosis   Right breast biopsy 1:00: IDC grade 2 ER 95%, PR 2%, Ki 67 15%; HER-2 negative ratio 1.04; 10:00: IDC grade 2-3 dissimilar to 1:00 biopsy 10:00: ER 95%, PR 0%, K67: 25%, HER-2 positive ratio 6.42 right axillary lymph node positive for metastatic cancer   08/20/2015 Procedure   Left biopsy 8:00: IDC with DCIS, grade 2; ER 95%, PR 95%, Ki-67 30%, HER-2 negative ratio 1.2; 12:00: IDC with DCIS, grade 1 ER 95%, PR 95%, Ki-67 10% HER-2 negative ratio 1.14 not similar to this biopsy, left axillary lymph node positive,    09/13/2015 - 01/17/2016 Neo-Adjuvant Chemotherapy   Neoadjuvant dose dense Adriamycin and Cytoxan followed by Taxol Herceptin Perjeta, followed by Herceptin maintenance   01/21/2016 Breast MRI   Bilateral breast masses significantly decreased in size with mild residual NME, complete resolution of numerous other nodules in both breasts, bilateral axillary lymph nodes and bilateral retropectoral lymph nodes decreased in size     02/11/2016 Surgery   Right mastectomy: IDC 2 nodules, 2.5, 1 cm, margins negative, 3/7 lymph nodes positive T2 N1a stage II B, ER 95%, PR 2%, HER-2 negative, Ki-67 15% RCB burden 3.1 and 2.9 class II   02/11/2016 Surgery   Left mastectomy: IDC 3.1 cm with DCIS, margins negative, 0/8 lymph nodes, T2 N0 stage II a, RCB burden 2.18, class II, ER 100%, PR 95%, HER-2 negative ratio 1.2, Ki-67 30%   04/07/2016 - 05/15/2016 Radiation Therapy   Adjuvant radiation   06/26/2016 - 07/13/2017 Anti-estrogen oral therapy   Letrozole 2.5 mg daily   11/20/2016 Miscellaneous   Neratinib 240 mg daily discontinued 07/13/2017 when she presented with metastatic disease to the lungs   07/13/2017 Relapse/Recurrence   Progression of right and left hilar lymph nodes and bilateral lung nodules   07/13/2017 -  Anti-estrogen oral therapy   Ibrance with letrozole   10/24/2017 Imaging   CT chest abdomen pelvis: Interval decrease in the left hilar node.  The more superior left hilar node is slightly increased in size.  Interval decrease in the size of multiple lung nodules   04/26/2018 Imaging   Interval development of multiple lytic bone metastases.  Lesion in the left acetabulum measures 5.5 cm, new lesion L3 extending to the left pedicle.  Perihilar nodule left lower lobe increased in size 2.8 cm from 1.8 cm, other nodules not changed.  Similar appearance of left hilar adenopathy, small perinephric soft tissue nodule increased in size   05/07/2018 - 05/19/2018 Radiation Therapy   Radiation to  L3 (30 Gy in 10 Fx)   01/04/2019 Progression   Bronchoscopic lung biopsy (Dr. Clyda Greener): metastatic carcinoma, HER-2 negative by FISH, ER+ 50%, PR+ 10%.     CHIEF COMPLIANT: Follow-up of metastatic breast cancer on Faslodex with Alpelisib  INTERVAL HISTORY: Felicia [name] is a 58 y.o. with above-mentioned history of metastatic breast cancer currently on treatment with Faslodex with Alpelisib. 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 She presents to the clinic today for a toxicity check.  She is complaining of nausea issues after radiation. Her energy levels have declined and she is using a wheelchair. We consulted palliative care for you to see her.  REVIEW OF SYSTEMS:   Constitutional: Denies fevers, chills or abnormal weight loss Eyes: Denies blurriness of vision Ears, nose, mouth, throat, and face: Denies mucositis or sore throat Respiratory: Denies cough, dyspnea or wheezes Cardiovascular: Denies palpitation, chest discomfort Gastrointestinal: Denies nausea, heartburn or change in bowel habits Skin: Denies abnormal skin rashes Lymphatics: Denies new lymphadenopathy or easy bruising Neurological: Denies numbness, tingling or new weaknesses Behavioral/Psych: Mood is stable, no new changes  Extremities:  lower extremity edema Breast: denies any pain or lumps or nodules in either breasts All other systems were reviewed with the patient and are negative.  I have reviewed the past medical history, past surgical history, social history and family history with the patient and they are unchanged from previous note.  ALLERGIES:  is allergic to no known allergies.  MEDICATIONS:  Current Outpatient Medications  Medication Sig Dispense Refill   albuterol (VENTOLIN HFA) 108 (90 Base) MCG/ACT inhaler Inhale 2 puffs into the lungs every 4 (four) hours as needed for wheezing or shortness of breath. 1 Inhaler 5   alpelisib (PIQRAY '300MG'$  DAILY DOSE) 2 x 150 MG Therapy Pack Take two '150mg'$  tablets with food at the same time daily. Swallow whole, do not crush, chew, or split. 28 each 0   Calcium Carb-Cholecalciferol (CALCIUM 600+D) 600-800 MG-UNIT TABS Take 1 tablet by mouth daily.     Cholecalciferol (VITAMIN D-3) 5000 units TABS Take 1 tablet by mouth daily.     furosemide (LASIX) 20 MG tablet 20 mg once  daily for 4 days for swelling, stop then restart if swelling returns 30 tablet 0   letrozole (FEMARA) 2.5 MG tablet Take 1 tablet (2.5 mg total) by mouth daily. 90 tablet 3   lidocaine-prilocaine (EMLA) cream Apply 1 application topically as needed (Apply 2- hours prior to access of port). 30 g 6   lisinopril (PRINIVIL,ZESTRIL) 20 MG tablet Take 1 tablet (20 mg total) by mouth daily. 30 tablet 6   metFORMIN (GLUCOPHAGE XR) 500 MG 24 hr tablet Take 1 tablet (500 mg total) by mouth daily with breakfast. 30 tablet 2   oxyCODONE-acetaminophen (PERCOCET/ROXICET) 5-325 MG tablet Take 1 tablet by mouth every 6 (six) hours as needed for severe pain. 60 tablet 0   pantoprazole (PROTONIX) 40 MG tablet Take 1 tablet (40 mg total) by mouth daily. 30 tablet 3   No current facility-administered medications for this visit.    Facility-Administered Medications Ordered in Other Visits  Medication Dose Route Frequency Provider Last Rate Last Dose   sodium chloride flush (NS) 0.9 % injection 10 mL  10 mL Intravenous PRN Nicholas Lose, MD   10 mL at 04/14/17 0831    PHYSICAL EXAMINATION: ECOG PERFORMANCE STATUS: 3 - Symptomatic, >50% confined to bed  Vitals:  02/18/19 0830  BP: (!) 139/96  Pulse: 75  Resp: 17  Temp: 98.5 F (36.9 C)  SpO2: 99%   Filed Weights   02/18/19 0830  Weight: 189 lb 6.4 oz (85.9 kg)    GENERAL: alert, no distress and comfortable SKIN: skin color, texture, turgor are normal, no rashes or significant lesions EYES: normal, Conjunctiva are pink and non-injected, sclera clear OROPHARYNX: no exudate, no erythema and lips, buccal mucosa, and tongue normal  NECK: supple, thyroid normal size, non-tender, without nodularity LYMPH: no palpable lymphadenopathy in the cervical, axillary or inguinal LUNGS: clear to auscultation and percussion with normal breathing effort HEART: regular rate & rhythm and no murmurs and no lower extremity edema ABDOMEN: abdomen soft, non-tender  and normal bowel sounds MUSCULOSKELETAL: no cyanosis of digits and no clubbing  NEURO: alert & oriented x 3 with fluent speech, no focal motor/sensory deficits EXTREMITIES: No lower extremity edema  LABORATORY DATA:  I have reviewed the data as listed CMP Latest Ref Rng & Units 02/03/2019 01/20/2019 12/24/2018  Glucose 70 - 99 mg/dL 117(H) 119(H) 105(H)  BUN 6 - 20 mg/dL '9 12 13  '$ Creatinine 0.44 - 1.00 mg/dL 0.62 0.66 0.69  Sodium 135 - 145 mmol/L 136 137 140  Potassium 3.5 - 5.1 mmol/L 3.6 3.7 4.0  Chloride 98 - 111 mmol/L 103 104 103  CO2 22 - 32 mmol/L '26 26 26  '$ Calcium 8.9 - 10.3 mg/dL 9.1 10.1 9.8  Total Protein 6.5 - 8.1 g/dL 7.5 7.6 7.6  Total Bilirubin 0.3 - 1.2 mg/dL 0.6 0.6 0.6  Alkaline Phos 38 - 126 U/L 80 80 69  AST 15 - 41 U/L 31 38 37  ALT 0 - 44 U/L '25 30 31    '$ Lab Results  Component Value Date   WBC 7.0 02/18/2019   HGB 13.1 02/18/2019   HCT 38.9 02/18/2019   MCV 85.9 02/18/2019   PLT 232 02/18/2019   NEUTROABS 5.6 02/18/2019    ASSESSMENT & PLAN:  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 Neoadjuvant chemotherapy with either AC followed by THP started 09/13/2015 and completed 12 weeks of Taxol 11/29/2015, Herceptin Perjeta 6; now on Herceptin  2. Bilateral mastectomies and lymph node surgery 02/11/16 3. Adjuvant radiation 04/07/2016 to 05/15/2016 4. Adjuvant antiestrogen therapy with Letrozole started 06/26/2016 5. Neratinib after Herceptin maintenancediscontinued 07/13/2017 when she developed metastatic disease 6.Ibrance with letrozole started March 2019 stopped 04/29/2018 due to progression 7. Palliative radiation L2-4 and left pelvis completed 05/19/2018  Caris molecular test: ER positive, PR positive, PI K3CA mutation present,HER-2 negative ER positive, TMB high 19 mutations, PDL 1  -0% ______________________________________________________________________________________ Bronchoscopy and biopsy 01/04/2019: Poorly differentiated adenocarcinoma ER 50% positive, PR 10%, HER-2 equivocal by IHC 2+, FISH negative ratio 1.03  Current treatment:Faslodex with Alpelisib (will be started in 1 to 2 days) Swelling of the face: CT of the head was performed on 02/02/2019 which showed two 1 cm brain lesions without edema. Plan: 1.  Appointment radiation oncology for radiation to the brain lesions as well as her back. 2.  Start Faslodex with the Alpelisib as soon as the Alpelisib becomes available. 3.  Bone metastases: Continue with every 39-monthXgeva with calcium and vitamin D.  MRI brain 02/11/2019: Numerous subcentimeter foci of supratentorial and infratentorial enhancement consistent with leptomeningeal disease.  Numerous skull metastases.    Current treatment: Whole brain radiation  Counseling: I discussed with the patient the poor prognosis nature of  her condition. We discussed the pros and cons of continuing alpelisib with Faslodex versus hospice care I informed her that she probably has less than 2 to 3 months to live. I wrote letters to Epifanio Lesches so that her daughters can visit her.  So far she has no toxicities and she wishes to stay on it for little while longer to see if it does make any difference.  I will see her on the last day of radiation.  She is due for her injections today.     No orders of the defined types were placed in this encounter.  The patient has a good understanding of the overall plan. she agrees with it. she will call with any problems that may develop before the next visit here.  Heath Lark, MD 02/18/2019  Julious Oka Dorshimer am acting as scribe for Dr. Nicholas Lose.  I have reviewed the above documentation for accuracy and completeness, and I agree with the above.

## 2019-02-17 NOTE — Assessment & Plan Note (Signed)
Treatment Summary: 1. Neoadjuvant chemotherapy with either AC followed by THP started 09/13/2015 and completed 12 weeks of Taxol 11/29/2015, Herceptin Perjeta 6; now on Herceptin  2. Bilateral mastectomies and lymph node surgery 02/11/16 3. Adjuvant radiation 04/07/2016 to 05/15/2016 4. Adjuvant antiestrogen therapy with Letrozole started 06/26/2016 5. Neratinib after Herceptin maintenancediscontinued 07/13/2017 when she developed metastatic disease 6.Ibrance with letrozole started March 2019 stopped 04/29/2018 due to progression 7. Palliative radiation L2-4 and left pelvis completed 05/19/2018  Caris molecular test: ER positive, PR positive, PI K3CA mutation present,HER-2 negative ER positive, TMB high 19 mutations, PDL 1 -0% ______________________________________________________________________________________ Bronchoscopy and biopsy 01/04/2019: Poorly differentiated adenocarcinoma ER 50% positive, PR 10%, HER-2 equivocal by IHC 2+, FISH negative ratio 1.03  Current treatment:Faslodex with Alpelisib (will be started in 1 to 2 days) Swelling of the face: CT of the head was performed on 02/02/2019 which showed two 1 cm brain lesions without edema. Plan: 1.  Appointment radiation oncology for radiation to the brain lesions as well as her back. 2.  Start Faslodex with the Alpelisib as soon as the Alpelisib becomes available. 3.  Bone metastases: Continue with every 88-monthXgeva with calcium and vitamin D.  MRI brain 02/11/2019: Numerous subcentimeter foci of supratentorial and infratentorial enhancement consistent with leptomeningeal disease.  Numerous skull metastases.  MRI total spine: Pending Current treatment: Whole brain radiation  Counseling: I discussed with the patient the poor prognosis nature of her condition. We discussed the pros and cons of starting alpelisib with Faslodex versus hospice care.

## 2019-02-18 ENCOUNTER — Inpatient Hospital Stay: Payer: Self-pay

## 2019-02-18 ENCOUNTER — Ambulatory Visit
Admission: RE | Admit: 2019-02-18 | Discharge: 2019-02-18 | Disposition: A | Discharge status: Home / Self Care | Payer: Self-pay | Source: Ambulatory Visit | Attending: Radiation Oncology | Admitting: Radiation Oncology

## 2019-02-18 ENCOUNTER — Telehealth: Payer: Self-pay

## 2019-02-18 ENCOUNTER — Other Ambulatory Visit: Payer: Self-pay

## 2019-02-18 ENCOUNTER — Inpatient Hospital Stay (HOSPITAL_BASED_OUTPATIENT_CLINIC_OR_DEPARTMENT_OTHER): Payer: Self-pay | Admitting: Hematology and Oncology

## 2019-02-18 DIAGNOSIS — Z95828 Presence of other vascular implants and grafts: Secondary | ICD-10-CM

## 2019-02-18 DIAGNOSIS — Z170 Estrogen receptor positive status [ER+]: Secondary | ICD-10-CM

## 2019-02-18 DIAGNOSIS — C50011 Malignant neoplasm of nipple and areola, right female breast: Secondary | ICD-10-CM

## 2019-02-18 DIAGNOSIS — C50012 Malignant neoplasm of nipple and areola, left female breast: Secondary | ICD-10-CM

## 2019-02-18 DIAGNOSIS — C7800 Secondary malignant neoplasm of unspecified lung: Secondary | ICD-10-CM

## 2019-02-18 LAB — CBC WITH DIFFERENTIAL (CANCER CENTER ONLY)
Abs Immature Granulocytes: 0.01 10*3/uL (ref 0.00–0.07)
Basophils Absolute: 0 10*3/uL (ref 0.0–0.1)
Basophils Relative: 0 %
Eosinophils Absolute: 0.1 10*3/uL (ref 0.0–0.5)
Eosinophils Relative: 1 %
HCT: 38.9 % (ref 36.0–46.0)
Hemoglobin: 13.1 g/dL (ref 12.0–15.0)
Immature Granulocytes: 0 %
Lymphocytes Relative: 13 %
Lymphs Abs: 0.9 10*3/uL (ref 0.7–4.0)
MCH: 28.9 pg (ref 26.0–34.0)
MCHC: 33.7 g/dL (ref 30.0–36.0)
MCV: 85.9 fL (ref 80.0–100.0)
Monocytes Absolute: 0.4 10*3/uL (ref 0.1–1.0)
Monocytes Relative: 6 %
Neutro Abs: 5.6 10*3/uL (ref 1.7–7.7)
Neutrophils Relative %: 80 %
Platelet Count: 232 10*3/uL (ref 150–400)
RBC: 4.53 MIL/uL (ref 3.87–5.11)
RDW: 12.2 % (ref 11.5–15.5)
WBC Count: 7 10*3/uL (ref 4.0–10.5)
nRBC: 0 % (ref 0.0–0.2)

## 2019-02-18 LAB — CMP (CANCER CENTER ONLY)
ALT: 34 U/L (ref 0–44)
AST: 37 U/L (ref 15–41)
Albumin: 3.3 g/dL — ABNORMAL LOW (ref 3.5–5.0)
Alkaline Phosphatase: 90 U/L (ref 38–126)
Anion gap: 10 (ref 5–15)
BUN: 8 mg/dL (ref 6–20)
CO2: 25 mmol/L (ref 22–32)
Calcium: 8.4 mg/dL — ABNORMAL LOW (ref 8.9–10.3)
Chloride: 99 mmol/L (ref 98–111)
Creatinine: 0.7 mg/dL (ref 0.44–1.00)
GFR, Est AFR Am: >60 mL/min (ref >60)
GFR, Estimated: >60 mL/min (ref >60)
Glucose, Bld: 288 mg/dL — ABNORMAL HIGH (ref 70–99)
Potassium: 3.3 mmol/L — ABNORMAL LOW (ref 3.5–5.1)
Sodium: 134 mmol/L — ABNORMAL LOW (ref 135–145)
Total Bilirubin: 0.7 mg/dL (ref 0.3–1.2)
Total Protein: 7.3 g/dL (ref 6.5–8.1)

## 2019-02-18 MED ORDER — DENOSUMAB 120 MG/1.7ML ~~LOC~~ SOLN: 120.0000 mg | Freq: Once | SUBCUTANEOUS | Status: DC

## 2019-02-18 MED ORDER — FULVESTRANT 250 MG/5ML IM SOLN
INTRAMUSCULAR | Status: AC
  Filled 2019-02-18: qty 10

## 2019-02-18 MED ORDER — ONDANSETRON 8 MG PO TBDP: 8.0000 mg | ORAL_TABLET | Freq: Three times a day (TID) | ORAL | 3 refills | Status: AC | PRN

## 2019-02-18 MED ORDER — FULVESTRANT 250 MG/5ML IM SOLN
500.0000 mg | Freq: Once | INTRAMUSCULAR | Status: AC
  Administered 2019-02-18: 500 mg via INTRAMUSCULAR

## 2019-02-18 MED ORDER — SODIUM CHLORIDE 0.9% FLUSH
10.0000 mL | INTRAVENOUS | Status: DC | PRN
  Administered 2019-02-18: 10 mL via INTRAVENOUS
  Filled 2019-02-18: qty 10

## 2019-02-18 NOTE — Telephone Encounter (Signed)
Per Dr Lindi Adie Pt to receive Faslodex only today. She will receive xgeva Q3 months

## 2019-02-18 NOTE — Patient Instructions (Signed)
Fulvestrant injection What is this medicine? FULVESTRANT (ful VES trant) blocks the effects of estrogen. It is used to treat breast cancer. This medicine may be used for other purposes; ask your health care provider or pharmacist if you have questions. COMMON BRAND NAME(S): FASLODEX What should I tell my health care provider before I take this medicine? They need to know if you have any of these conditions:  bleeding disorders  liver disease  low blood counts, like low white cell, platelet, or red cell counts  an unusual or allergic reaction to fulvestrant, other medicines, foods, dyes, or preservatives  pregnant or trying to get pregnant  breast-feeding How should I use this medicine? This medicine is for injection into a muscle. It is usually given by a health care professional in a hospital or clinic setting. Talk to your pediatrician regarding the use of this medicine in children. Special care may be needed. Overdosage: If you think you have taken too much of this medicine contact a poison control center or emergency room at once. NOTE: This medicine is only for you. Do not share this medicine with others. What if I miss a dose? It is important not to miss your dose. Call your doctor or health care professional if you are unable to keep an appointment. What may interact with this medicine?  medicines that treat or prevent blood clots like warfarin, enoxaparin, dalteparin, apixaban, dabigatran, and rivaroxaban This list may not describe all possible interactions. Give your health care provider a list of all the medicines, herbs, non-prescription drugs, or dietary supplements you use. Also tell them if you smoke, drink alcohol, or use illegal drugs. Some items may interact with your medicine. What should I watch for while using this medicine? Your condition will be monitored carefully while you are receiving this medicine. You will need important blood work done while you are taking  this medicine. Do not become pregnant while taking this medicine or for at least 1 year after stopping it. Women of child-bearing potential will need to have a negative pregnancy test before starting this medicine. Women should inform their doctor if they wish to become pregnant or think they might be pregnant. There is a potential for serious side effects to an unborn child. Men should inform their doctors if they wish to father a child. This medicine may lower sperm counts. Talk to your health care professional or pharmacist for more information. Do not breast-feed an infant while taking this medicine or for 1 year after the last dose. What side effects may I notice from receiving this medicine? Side effects that you should report to your doctor or health care professional as soon as possible:  allergic reactions like skin rash, itching or hives, swelling of the face, lips, or tongue  feeling faint or lightheaded, falls  pain, tingling, numbness, or weakness in the legs  signs and symptoms of infection like fever or chills; cough; flu-like symptoms; sore throat  vaginal bleeding Side effects that usually do not require medical attention (report to your doctor or health care professional if they continue or are bothersome):  aches, pains  constipation  diarrhea  headache  hot flashes  nausea, vomiting  pain at site where injected  stomach pain This list may not describe all possible side effects. Call your doctor for medical advice about side effects. You may report side effects to FDA at 1-800-FDA-1088. Where should I keep my medicine? This drug is given in a hospital or clinic and will   not be stored at home. NOTE: This sheet is a summary. It may not cover all possible information. If you have questions about this medicine, talk to your doctor, pharmacist, or health care provider.  2020 Elsevier/Gold Standard (2017-07-23 11:34:41)  

## 2019-02-21 ENCOUNTER — Ambulatory Visit: Payer: Self-pay | Admitting: Hematology and Oncology

## 2019-02-21 ENCOUNTER — Ambulatory Visit: Payer: Self-pay

## 2019-02-21 ENCOUNTER — Other Ambulatory Visit: Payer: Self-pay

## 2019-02-21 ENCOUNTER — Telehealth: Payer: Self-pay | Admitting: Hematology and Oncology

## 2019-02-21 ENCOUNTER — Ambulatory Visit (HOSPITAL_COMMUNITY)
Admission: RE | Admit: 2019-02-21 | Discharge: 2019-02-21 | Disposition: A | Discharge status: Home / Self Care | Payer: Self-pay | Source: Ambulatory Visit | Attending: Radiation Oncology | Admitting: Radiation Oncology

## 2019-02-21 ENCOUNTER — Ambulatory Visit
Admission: RE | Admit: 2019-02-21 | Discharge: 2019-02-21 | Disposition: A | Discharge status: Home / Self Care | Payer: Self-pay | Source: Ambulatory Visit | Attending: Radiation Oncology | Admitting: Radiation Oncology

## 2019-02-21 NOTE — Progress Notes (Signed)
Patient was a no show for MRI on 02-21-19.

## 2019-02-21 NOTE — Telephone Encounter (Signed)
I talk with patient daughter regarding 11/3

## 2019-02-22 ENCOUNTER — Ambulatory Visit
Admission: RE | Admit: 2019-02-22 | Discharge: 2019-02-22 | Disposition: A | Discharge status: Home / Self Care | Payer: Self-pay | Source: Ambulatory Visit | Attending: Radiation Oncology | Admitting: Radiation Oncology

## 2019-02-22 ENCOUNTER — Other Ambulatory Visit: Payer: Self-pay

## 2019-02-23 ENCOUNTER — Ambulatory Visit
Admission: RE | Admit: 2019-02-23 | Discharge: 2019-02-23 | Disposition: A | Discharge status: Home / Self Care | Payer: Self-pay | Source: Ambulatory Visit | Attending: Radiation Oncology | Admitting: Radiation Oncology

## 2019-02-23 ENCOUNTER — Other Ambulatory Visit: Payer: Self-pay

## 2019-02-24 ENCOUNTER — Ambulatory Visit
Admission: RE | Admit: 2019-02-24 | Discharge: 2019-02-24 | Disposition: A | Discharge status: Home / Self Care | Payer: Self-pay | Source: Ambulatory Visit | Attending: Radiation Oncology | Admitting: Radiation Oncology

## 2019-02-24 ENCOUNTER — Other Ambulatory Visit: Payer: Self-pay

## 2019-02-25 ENCOUNTER — Other Ambulatory Visit: Payer: Self-pay

## 2019-02-25 ENCOUNTER — Ambulatory Visit
Admission: RE | Admit: 2019-02-25 | Discharge: 2019-02-25 | Disposition: A | Discharge status: Home / Self Care | Payer: Self-pay | Source: Ambulatory Visit | Attending: Radiation Oncology | Admitting: Radiation Oncology

## 2019-02-28 ENCOUNTER — Other Ambulatory Visit: Payer: Self-pay

## 2019-02-28 ENCOUNTER — Ambulatory Visit
Admission: RE | Admit: 2019-02-28 | Discharge: 2019-02-28 | Disposition: A | Discharge status: Home / Self Care | Payer: Self-pay | Source: Ambulatory Visit | Attending: Radiation Oncology | Admitting: Radiation Oncology

## 2019-02-28 DIAGNOSIS — C50919 Malignant neoplasm of unspecified site of unspecified female breast: Secondary | ICD-10-CM | POA: Insufficient documentation

## 2019-02-28 DIAGNOSIS — Z510 Encounter for antineoplastic radiation therapy: Secondary | ICD-10-CM | POA: Insufficient documentation

## 2019-02-28 DIAGNOSIS — C7951 Secondary malignant neoplasm of bone: Secondary | ICD-10-CM | POA: Insufficient documentation

## 2019-02-28 NOTE — Progress Notes (Signed)
Patient Care Team: [name], MD as PCP - General (Family Medicine) Nicholas Lose, MD as Consulting Physician (Hematology and Oncology) Delice Bison, Charlestine Massed, NP as Nurse Practitioner (Hematology and Oncology) Kyung Rudd, MD as Consulting Physician (Radiation Oncology) Rolm Bookbinder, MD as Consulting Physician (General Surgery)  DIAGNOSIS:    ICD-10-CM   1. Bone metastasis ([place])  C79.51   2. Malignant neoplasm metastatic to lung, unspecified laterality ([place])  C78.00   3. 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, estrogen receptor positive ([place])  08/16/2015 Mammogram   Left breast LIQ 4.2 cm, 4 mm oval mass 12:00, 1.2 cm mass left UOQ, enlarged left axillary lymph node   08/16/2015 Mammogram   Right breast UIQ: 3.9 cm irregular mass, 1.5 cm mass in the right LOQ, 9 mm mass right UOQ, 6 mm mass right LIQ, enlarged lymph node Rt axilla   08/20/2015 Initial Diagnosis   Right breast biopsy 1:00: IDC grade 2 ER 95%, PR 2%, Ki 67 15%; HER-2 negative ratio 1.04; 10:00: IDC grade 2-3 dissimilar to 1:00 biopsy 10:00: ER 95%, PR 0%, K67: 25%, HER-2 positive ratio 6.42 right axillary lymph node positive for metastatic cancer   08/20/2015 Procedure   Left biopsy 8:00: IDC with DCIS, grade 2; ER 95%, PR 95%, Ki-67 30%, HER-2 negative ratio 1.2; 12:00: IDC with DCIS, grade 1 ER 95%, PR 95%, Ki-67 10% HER-2 negative ratio 1.14 not similar to this biopsy, left axillary lymph node positive,    09/13/2015 - 01/17/2016 Neo-Adjuvant Chemotherapy   Neoadjuvant dose dense Adriamycin and Cytoxan followed by Taxol Herceptin  Perjeta, followed by Herceptin maintenance   01/21/2016 Breast MRI   Bilateral breast masses significantly decreased in size with mild residual NME, complete resolution of numerous other nodules in both breasts, bilateral axillary lymph nodes and bilateral retropectoral lymph nodes decreased in size   02/11/2016 Surgery   Right mastectomy: IDC 2 nodules, 2.5, 1 cm, margins negative, 3/7 lymph nodes positive T2 N1a stage II B, ER 95%, PR 2%, HER-2 negative, Ki-67 15% RCB burden 3.1 and 2.9 class II   02/11/2016 Surgery   Left mastectomy: IDC 3.1 cm with DCIS, margins negative, 0/8 lymph nodes, T2 N0 stage II a, RCB burden 2.18, class II, ER 100%, PR 95%, HER-2 negative ratio 1.2, Ki-67 30%   04/07/2016 - 05/15/2016 Radiation Therapy   Adjuvant radiation   06/26/2016 - 07/13/2017 Anti-estrogen oral therapy   Letrozole 2.5 mg daily   11/20/2016 Miscellaneous   Neratinib 240 mg daily discontinued 07/13/2017 when she presented with metastatic disease to the lungs   07/13/2017 Relapse/Recurrence   Progression of right and left hilar lymph nodes and bilateral lung nodules   07/13/2017 -  Anti-estrogen oral therapy   Ibrance with letrozole   10/24/2017 Imaging   CT chest abdomen pelvis: Interval decrease in the left hilar node.  The more superior left hilar node is slightly increased in size.  Interval decrease in the size of multiple lung nodules   04/26/2018 Imaging   Interval development of multiple lytic bone metastases.  Lesion in the left acetabulum measures 5.5 cm, new  lesion L3 extending to the left pedicle.  Perihilar nodule left lower lobe increased in size 2.8 cm from 1.8 cm, other nodules not changed.  Similar appearance of left hilar adenopathy, small perinephric soft tissue nodule increased in size   05/07/2018 - 05/19/2018 Radiation Therapy   Radiation to L3 (30 Gy in 10 Fx)   01/04/2019 Progression   Bronchoscopic lung biopsy (Dr. Clyda Greener): metastatic carcinoma, HER-2 negative by FISH, ER+  50%, PR+ 10%.   02/21/2019 -  Radiation Therapy   Whole brain radiation     CHIEF COMPLIANT: Follow-up of metastatic breast cancer onFaslodex with Alpelisib  INTERVAL HISTORY: Felicia [name] is a 58 y.o. with above-mentioned history of metastatic breast cancercurrently on treatment withFaslodex with Alpelisib and undergoing whole brain radiation. She presents to the clinic today for follow-up.  She has noticed fatigue as well as nausea as a side effect.  Her blood sugars have increased from 300 at baseline to 400.  This is in spite of taking Metformin 500 mg a day.  REVIEW OF SYSTEMS:   Constitutional: In a wheelchair complaining of fatigue and generalized weakness Eyes: Denies blurriness of vision Ears, nose, mouth, throat, and face: Denies mucositis or sore throat Respiratory: Denies cough, dyspnea or wheezes Cardiovascular: Denies palpitation, chest discomfort Gastrointestinal: Nausea is present Skin: Denies abnormal skin rashes Lymphatics: Denies new lymphadenopathy or easy bruising Neurological: Generalized weaknesses, intermittent headaches Behavioral/Psych: Mood is stable, no new changes  Extremities: No lower extremity edema Breast: denies any pain or lumps or nodules in either breasts All other systems were reviewed with the patient and are negative.  I have reviewed the past medical history, past surgical history, social history and family history with the patient and they are unchanged from previous note.  ALLERGIES:  is allergic to no known allergies.  MEDICATIONS:  Current Outpatient Medications  Medication Sig Dispense Refill   albuterol (VENTOLIN HFA) 108 (90 Base) MCG/ACT inhaler Inhale 2 puffs into the lungs every 4 (four) hours as needed for wheezing or shortness of breath. 1 Inhaler 5   alpelisib (PIQRAY 300MG DAILY DOSE) 2 x 150 MG Therapy Pack Take two 165m tablets with food at the same time daily. Swallow whole, do not crush, chew, or  split. 28 each 6   Calcium Carb-Cholecalciferol (CALCIUM 600+D) 600-800 MG-UNIT TABS Take 1 tablet by mouth daily.     Cholecalciferol (VITAMIN D-3) 5000 units TABS Take 1 tablet by mouth daily.     furosemide (LASIX) 20 MG tablet 20 mg once daily for 4 days for swelling, stop then restart if swelling returns 30 tablet 0   glucose blood (COOL BLOOD GLUCOSE TEST STRIPS) test strip Use as instructed 100 each 12   letrozole (FEMARA) 2.5 MG tablet Take 1 tablet (2.5 mg total) by mouth daily. 90 tablet 3   lidocaine-prilocaine (EMLA) cream Apply 1 application topically as needed (Apply 2- hours prior to access of port). 30 g 6   lisinopril (PRINIVIL,ZESTRIL) 20 MG tablet Take 1 tablet (20 mg total) by mouth daily. 30 tablet 6   metFORMIN (GLUMETZA) 1000 MG (MOD) 24 hr tablet Take 1 tablet (1,000 mg total) by mouth daily with breakfast. 30 tablet 3   ondansetron (ZOFRAN ODT) 8 MG disintegrating tablet Take 1 tablet (8 mg total) by mouth every 8 (eight) hours as needed for nausea or vomiting. 30 tablet 3   oxyCODONE-acetaminophen (PERCOCET/ROXICET) 5-325 MG tablet Take 1 tablet by mouth every 6 (six) hours as needed for severe pain.  60 tablet 0   pantoprazole (PROTONIX) 40 MG tablet Take 1 tablet (40 mg total) by mouth daily. 30 tablet 3   No current facility-administered medications for this visit.    Facility-Administered Medications Ordered in Other Visits  Medication Dose Route Frequency Provider Last Rate Last Dose   sodium chloride flush (NS) 0.9 % injection 10 mL  10 mL Intravenous PRN Nicholas Lose, MD   10 mL at 04/14/17 0831    PHYSICAL EXAMINATION: ECOG PERFORMANCE STATUS: 2 - Symptomatic, <50% confined to bed  Vitals:   03/01/19 1237  BP: (!) 160/91  Pulse: 87  Resp: 18  Temp: 98.9 F (37.2 C)  SpO2: 99%   Filed Weights   03/01/19 1237  Weight: 184 lb 14.4 oz (83.9 kg)    GENERAL: alert, no distress and comfortable SKIN: skin color, texture, turgor are normal,  no rashes or significant lesions EYES: normal, Conjunctiva are pink and non-injected, sclera clear OROPHARYNX: no exudate, no erythema and lips, buccal mucosa, and tongue normal  NECK: supple, thyroid normal size, non-tender, without nodularity LYMPH: no palpable lymphadenopathy in the cervical, axillary or inguinal LUNGS: clear to auscultation and percussion with normal breathing effort HEART: regular rate & rhythm and no murmurs and no lower extremity edema ABDOMEN: abdomen soft, non-tender and normal bowel sounds MUSCULOSKELETAL: no cyanosis of digits and no clubbing  NEURO: alert & oriented x 3 with fluent speech, no focal motor/sensory deficits EXTREMITIES: No lower extremity edema  LABORATORY DATA:  I have reviewed the data as listed CMP Latest Ref Rng & Units 03/01/2019 02/18/2019 02/03/2019  Glucose 70 - 99 mg/dL 398(H) 288(H) 117(H)  BUN 6 - 20 mg/dL _0 Creatinine 0.44 - 1.00 mg/dL 0.83 0.70 0.62  Sodium 135 - 145 mmol/L 135 134(L) 136  Potassium 3.5 - 5.1 mmol/L 3.4(L) 3.3(L) 3.6  Chloride 98 - 111 mmol/L 99 99 103  CO2 22 - 32 mmol/L _1 Calcium 8.9 - 10.3 mg/dL 9.2 8.4(L) 9.1  Total Protein 6.5 - 8.1 g/dL 7.4 7.3 7.5  Total Bilirubin 0.3 - 1.2 mg/dL 0.7 0.7 0.6  Alkaline Phos 38 - 126 U/L 97 90 80  AST 15 - 41 U/L 31 37 31  ALT 0 - 44 U/L 33 34 25    Lab Results  Component Value Date   WBC 6.6 03/01/2019   HGB 13.9 03/01/2019   HCT 41.3 03/01/2019   MCV 85.0 03/01/2019   PLT 152 03/01/2019   NEUTROABS 5.3 03/01/2019    ASSESSMENT & PLAN:  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 Neoadjuvant chemotherapy with either AC followed by THP started 09/13/2015 and completed 12 weeks of Taxol 11/29/2015, Herceptin Perjeta 6; now on Herceptin  2. Bilateral mastectomies and lymph node surgery 02/11/16 3. Adjuvant radiation 04/07/2016 to 05/15/2016 4. Adjuvant antiestrogen therapy with  Letrozole started 06/26/2016 5. Neratinib after Herceptin maintenancediscontinued 07/13/2017 when she developed metastatic disease 6.Ibrance with letrozole started March 2019 stopped 04/29/2018 due to progression 7. 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  Numerous skull  metastases. Current treatment: Whole brain radiation 02/17/2019-03/01/2019  Elevated blood sugars: I will increase Metformin to 1000 mg a day.  I asked her to buy a glucometer and check her blood sugars twice a day.  She will keep a log and will call us with her blood sugars.  If it continues to increase then we will have to increase the Metformin we will consider adding insulin.  Goals of care: Palliation Return to clinic for follow-up in [redacted] weeks along with her injection appointment.    No orders of the defined types were placed in this encounter.  The patient has a good understanding of the overall plan. she agrees with it. she will call with any problems that may develop before the next visit here.  Nicholas Lose, MD 03/01/2019  Julious Oka Dorshimer am acting as scribe for Dr. Nicholas Lose.  I have reviewed the above documentation for accuracy and completeness, and I agree with the above.

## 2019-03-01 ENCOUNTER — Telehealth: Payer: Self-pay

## 2019-03-01 ENCOUNTER — Ambulatory Visit
Admission: RE | Admit: 2019-03-01 | Discharge: 2019-03-01 | Disposition: A | Discharge status: Home / Self Care | Payer: Self-pay | Source: Ambulatory Visit | Attending: Radiation Oncology | Admitting: Radiation Oncology

## 2019-03-01 ENCOUNTER — Inpatient Hospital Stay: Payer: Self-pay

## 2019-03-01 ENCOUNTER — Other Ambulatory Visit: Payer: Self-pay

## 2019-03-01 ENCOUNTER — Inpatient Hospital Stay: Payer: Self-pay | Attending: Hematology and Oncology | Admitting: Hematology and Oncology

## 2019-03-01 ENCOUNTER — Encounter: Payer: Self-pay | Admitting: Radiation Oncology

## 2019-03-01 VITALS — BP 160/91 | HR 87 | Temp 98.9°F | Resp 18 | Ht 61.0 in | Wt 184.9 lb

## 2019-03-01 DIAGNOSIS — C50812 Malignant neoplasm of overlapping sites of left female breast: Secondary | ICD-10-CM

## 2019-03-01 DIAGNOSIS — R739 Hyperglycemia, unspecified: Secondary | ICD-10-CM | POA: Insufficient documentation

## 2019-03-01 DIAGNOSIS — Z5111 Encounter for antineoplastic chemotherapy: Secondary | ICD-10-CM | POA: Insufficient documentation

## 2019-03-01 DIAGNOSIS — C7951 Secondary malignant neoplasm of bone: Secondary | ICD-10-CM | POA: Insufficient documentation

## 2019-03-01 DIAGNOSIS — C50811 Malignant neoplasm of overlapping sites of right female breast: Secondary | ICD-10-CM

## 2019-03-01 DIAGNOSIS — C50012 Malignant neoplasm of nipple and areola, left female breast: Secondary | ICD-10-CM | POA: Insufficient documentation

## 2019-03-01 DIAGNOSIS — Z452 Encounter for adjustment and management of vascular access device: Secondary | ICD-10-CM | POA: Insufficient documentation

## 2019-03-01 DIAGNOSIS — C50011 Malignant neoplasm of nipple and areola, right female breast: Secondary | ICD-10-CM | POA: Insufficient documentation

## 2019-03-01 DIAGNOSIS — C7800 Secondary malignant neoplasm of unspecified lung: Secondary | ICD-10-CM

## 2019-03-01 DIAGNOSIS — Z170 Estrogen receptor positive status [ER+]: Secondary | ICD-10-CM | POA: Insufficient documentation

## 2019-03-01 DIAGNOSIS — Z95828 Presence of other vascular implants and grafts: Secondary | ICD-10-CM

## 2019-03-01 LAB — CBC WITH DIFFERENTIAL (CANCER CENTER ONLY)
Abs Immature Granulocytes: 0.03 10*3/uL (ref 0.00–0.07)
Basophils Absolute: 0 10*3/uL (ref 0.0–0.1)
Basophils Relative: 0 %
Eosinophils Absolute: 0.1 10*3/uL (ref 0.0–0.5)
Eosinophils Relative: 1 %
HCT: 41.3 % (ref 36.0–46.0)
Hemoglobin: 13.9 g/dL (ref 12.0–15.0)
Immature Granulocytes: 1 %
Lymphocytes Relative: 10 %
Lymphs Abs: 0.7 10*3/uL (ref 0.7–4.0)
MCH: 28.6 pg (ref 26.0–34.0)
MCHC: 33.7 g/dL (ref 30.0–36.0)
MCV: 85 fL (ref 80.0–100.0)
Monocytes Absolute: 0.5 10*3/uL (ref 0.1–1.0)
Monocytes Relative: 7 %
Neutro Abs: 5.3 10*3/uL (ref 1.7–7.7)
Neutrophils Relative %: 81 %
Platelet Count: 152 10*3/uL (ref 150–400)
RBC: 4.86 MIL/uL (ref 3.87–5.11)
RDW: 12.7 % (ref 11.5–15.5)
WBC Count: 6.6 10*3/uL (ref 4.0–10.5)
nRBC: 0 % (ref 0.0–0.2)

## 2019-03-01 LAB — CMP (CANCER CENTER ONLY)
ALT: 33 U/L (ref 0–44)
AST: 31 U/L (ref 15–41)
Albumin: 3.5 g/dL (ref 3.5–5.0)
Alkaline Phosphatase: 97 U/L (ref 38–126)
Anion gap: 12 (ref 5–15)
BUN: 10 mg/dL (ref 6–20)
CO2: 24 mmol/L (ref 22–32)
Calcium: 9.2 mg/dL (ref 8.9–10.3)
Chloride: 99 mmol/L (ref 98–111)
Creatinine: 0.83 mg/dL (ref 0.44–1.00)
GFR, Est AFR Am: >60 mL/min (ref >60)
GFR, Estimated: >60 mL/min (ref >60)
Glucose, Bld: 398 mg/dL — ABNORMAL HIGH (ref 70–99)
Potassium: 3.4 mmol/L — ABNORMAL LOW (ref 3.5–5.1)
Sodium: 135 mmol/L (ref 135–145)
Total Bilirubin: 0.7 mg/dL (ref 0.3–1.2)
Total Protein: 7.4 g/dL (ref 6.5–8.1)

## 2019-03-01 MED ORDER — SODIUM CHLORIDE 0.9% FLUSH
10.0000 mL | INTRAVENOUS | Status: DC | PRN
  Administered 2019-03-01: 10 mL via INTRAVENOUS
  Filled 2019-03-01: qty 10

## 2019-03-01 MED ORDER — METFORMIN HCL ER (MOD) 1000 MG PO TB24: 1000.0000 mg | ORAL_TABLET | Freq: Every day | ORAL | 3 refills | Status: AC

## 2019-03-01 MED ORDER — COOL BLOOD GLUCOSE TEST STRIPS VI STRP: ORAL_STRIP | 12 refills | Status: AC

## 2019-03-01 MED ORDER — ALPELISIB (300 MG DAILY DOSE) 2 X 150 MG PO TBPK: ORAL_TABLET | ORAL | 6 refills | Status: DC

## 2019-03-01 MED ORDER — HEPARIN SOD (PORK) LOCK FLUSH 100 UNIT/ML IV SOLN
500.0000 [IU] | Freq: Once | INTRAVENOUS | Status: AC | PRN
  Administered 2019-03-01: 500 [IU] via INTRAVENOUS
  Filled 2019-03-01: qty 5

## 2019-03-01 NOTE — Telephone Encounter (Signed)
Prescription for glucose meter, test strips and lancets called in to [facility].  Pt is aware.

## 2019-03-01 NOTE — Telephone Encounter (Signed)
Note entered for Dr. Clifton James Monguilod: Phone call placed to patient's home to offer to schedule a visit with NP. Call back information provided

## 2019-03-01 NOTE — Assessment & Plan Note (Addendum)
Treatment Summary: 1. Neoadjuvant chemotherapy with either AC followed by THP started 09/13/2015 and completed 12 weeks of Taxol 11/29/2015, Herceptin Perjeta 6; now on Herceptin  2. Bilateral mastectomies and lymph node surgery 02/11/16 3. Adjuvant radiation 04/07/2016 to 05/15/2016 4. Adjuvant antiestrogen therapy with Letrozole started 06/26/2016 5. Neratinib after Herceptin maintenancediscontinued 07/13/2017 when she developed metastatic disease 6.Ibrance with letrozole started March 2019 stopped 04/29/2018 due to progression 7. 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  Numerous skull metastases. Current treatment: Whole brain radiation 02/17/2019-03/01/2019  Goals of care: Palliation Return to clinic in 4 weeks for follow-up

## 2019-03-01 NOTE — Telephone Encounter (Signed)
Message left for patient's daughter by Dr. Konrad Dolores  to offer to schedule visit with Palliative NP. VM left with call back information.

## 2019-03-03 ENCOUNTER — Encounter: Payer: Self-pay | Admitting: Pharmacist

## 2019-03-24 NOTE — Progress Notes (Signed)
Patient Care Team: [name], MD as PCP - General (Family Medicine) Nicholas Lose, MD as Consulting Physician (Hematology and Oncology) Delice Bison, Charlestine Massed, NP as Nurse Practitioner (Hematology and Oncology) Kyung Rudd, MD as Consulting Physician (Radiation Oncology) Rolm Bookbinder, MD as Consulting Physician (General Surgery)  DIAGNOSIS:    ICD-10-CM   1. 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, estrogen receptor positive ([place])  08/16/2015 Mammogram   Left breast LIQ 4.2 cm, 4 mm oval mass 12:00, 1.2 cm mass left UOQ, enlarged left axillary lymph node   08/16/2015 Mammogram   Right breast UIQ: 3.9 cm irregular mass, 1.5 cm mass in the right LOQ, 9 mm mass right UOQ, 6 mm mass right LIQ, enlarged lymph node Rt axilla   08/20/2015 Initial Diagnosis   Right breast biopsy 1:00: IDC grade 2 ER 95%, PR 2%, Ki 67 15%; HER-2 negative ratio 1.04; 10:00: IDC grade 2-3 dissimilar to 1:00 biopsy 10:00: ER 95%, PR 0%, K67: 25%, HER-2 positive ratio 6.42 right axillary lymph node positive for metastatic cancer   08/20/2015 Procedure   Left biopsy 8:00: IDC with DCIS, grade 2; ER 95%, PR 95%, Ki-67 30%, HER-2 negative ratio 1.2; 12:00: IDC with DCIS, grade 1 ER 95%, PR 95%, Ki-67 10% HER-2 negative ratio 1.14 not similar to this biopsy, left axillary lymph node positive,    09/13/2015 - 01/17/2016 Neo-Adjuvant Chemotherapy   Neoadjuvant dose dense Adriamycin and Cytoxan followed by Taxol Herceptin Perjeta, followed by Herceptin maintenance   01/21/2016 Breast MRI   Bilateral breast masses significantly decreased in size with mild residual NME, complete resolution of numerous other nodules in both breasts, bilateral axillary lymph nodes and bilateral retropectoral lymph nodes decreased in size    02/11/2016 Surgery   Right mastectomy: IDC 2 nodules, 2.5, 1 cm, margins negative, 3/7 lymph nodes positive T2 N1a stage II B, ER 95%, PR 2%, HER-2 negative, Ki-67 15% RCB burden 3.1 and 2.9 class II   02/11/2016 Surgery   Left mastectomy: IDC 3.1 cm with DCIS, margins negative, 0/8 lymph nodes, T2 N0 stage II a, RCB burden 2.18, class II, ER 100%, PR 95%, HER-2 negative ratio 1.2, Ki-67 30%   04/07/2016 - 05/15/2016 Radiation Therapy   Adjuvant radiation   06/26/2016 - 07/13/2017 Anti-estrogen oral therapy   Letrozole 2.5 mg daily   11/20/2016 Miscellaneous   Neratinib 240 mg daily discontinued 07/13/2017 when she presented with metastatic disease to the lungs   07/13/2017 Relapse/Recurrence   Progression of right and left hilar lymph nodes and bilateral lung nodules   07/13/2017 -  Anti-estrogen oral therapy   Ibrance with letrozole   10/24/2017 Imaging   CT chest abdomen pelvis: Interval decrease in the left hilar node.  The more superior left hilar node is slightly increased in size.  Interval decrease in the size of multiple lung nodules   04/26/2018 Imaging   Interval development of multiple lytic bone metastases.  Lesion in the left acetabulum measures 5.5 cm, new lesion L3 extending to the left pedicle.  Perihilar nodule left lower lobe increased in size 2.8 cm from 1.8 cm, other nodules not changed.  Similar appearance of left hilar adenopathy, small perinephric soft tissue nodule increased in size   05/07/2018 - 05/19/2018 Radiation Therapy   Radiation to L3 (  30 Gy in 10 Fx)   01/04/2019 Progression   Bronchoscopic lung biopsy (Dr. Clyda Greener): metastatic carcinoma, HER-2 negative by FISH, ER+ 50%, PR+ 10%.   02/21/2019 - 03/01/2019 Radiation Therapy   Whole brain radiation     CHIEF COMPLIANT: Follow-up of metastatic breast cancer onFaslodex with Alpelisib  INTERVAL HISTORY: Felicia [name] is a 58 y.o. with above-mentioned history of metastatic breast  cancercurrently on treatment withFaslodex with Alpelisib. She presents to the clinic today for follow-up.   REVIEW OF SYSTEMS:   Constitutional: Denies fevers, chills or abnormal weight loss Eyes: Denies blurriness of vision Ears, nose, mouth, throat, and face: Denies mucositis or sore throat Respiratory: Denies cough, dyspnea or wheezes Cardiovascular: Denies palpitation, chest discomfort Gastrointestinal: Denies nausea, heartburn or change in bowel habits Skin: Denies abnormal skin rashes Lymphatics: Denies new lymphadenopathy or easy bruising Neurological: Denies numbness, tingling or new weaknesses Behavioral/Psych: Mood is stable, no new changes  Extremities: No lower extremity edema Breast: denies any pain or lumps or nodules in either breasts All other systems were reviewed with the patient and are negative.  I have reviewed the past medical history, past surgical history, social history and family history with the patient and they are unchanged from previous note.  ALLERGIES:  is allergic to no known allergies.  MEDICATIONS:  Current Outpatient Medications  Medication Sig Dispense Refill  . albuterol (VENTOLIN HFA) 108 (90 Base) MCG/ACT inhaler Inhale 2 puffs into the lungs every 4 (four) hours as needed for wheezing or shortness of breath. 1 Inhaler 5  . alpelisib (PIQRAY '300MG'$  DAILY DOSE) 2 x 150 MG Therapy Pack Take two '150mg'$  tablets with food at the same time daily. Swallow whole, do not crush, chew, or split. 28 each 6  . Calcium Carb-Cholecalciferol (CALCIUM 600+D) 600-800 MG-UNIT TABS Take 1 tablet by mouth daily.    . Cholecalciferol (VITAMIN D-3) 5000 units TABS Take 1 tablet by mouth daily.    . furosemide (LASIX) 20 MG tablet 20 mg once daily for 4 days for swelling, stop then restart if swelling returns 30 tablet 0  . glucose blood (COOL BLOOD GLUCOSE TEST STRIPS) test strip Use as instructed 100 each 12  . letrozole (FEMARA) 2.5 MG tablet Take 1 tablet (2.5 mg  total) by mouth daily. 90 tablet 3  . lidocaine-prilocaine (EMLA) cream Apply 1 application topically as needed (Apply 2- hours prior to access of port). 30 g 6  . lisinopril (PRINIVIL,ZESTRIL) 20 MG tablet Take 1 tablet (20 mg total) by mouth daily. 30 tablet 6  . metFORMIN (GLUMETZA) 1000 MG (MOD) 24 hr tablet Take 1 tablet (1,000 mg total) by mouth daily with breakfast. 30 tablet 3  . ondansetron (ZOFRAN ODT) 8 MG disintegrating tablet Take 1 tablet (8 mg total) by mouth every 8 (eight) hours as needed for nausea or vomiting. 30 tablet 3  . oxyCODONE-acetaminophen (PERCOCET/ROXICET) 5-325 MG tablet Take 1 tablet by mouth every 6 (six) hours as needed for severe pain. 60 tablet 0  . pantoprazole (PROTONIX) 40 MG tablet Take 1 tablet (40 mg total) by mouth daily. 30 tablet 3   No current facility-administered medications for this visit.    Facility-Administered Medications Ordered in Other Visits  Medication Dose Route Frequency Provider Last Rate Last Dose  . sodium chloride flush (NS) 0.9 % injection 10 mL  10 mL Intravenous PRN Nicholas Lose, MD   10 mL at 04/14/17 0831    PHYSICAL EXAMINATION: ECOG PERFORMANCE STATUS: 1 - Symptomatic but  completely ambulatory  Vitals:   03/25/19 1215  BP: 134/87  Pulse: 85  Resp: 17  Temp: 98.5 F (36.9 C)  SpO2: 100%   Filed Weights   03/25/19 1215  Weight: 181 lb 6.4 oz (82.3 kg)    GENERAL: alert, no distress and comfortable SKIN: skin color, texture, turgor are normal, no rashes or significant lesions EYES: normal, Conjunctiva are pink and non-injected, sclera clear OROPHARYNX: no exudate, no erythema and lips, buccal mucosa, and tongue normal  NECK: supple, thyroid normal size, non-tender, without nodularity LYMPH: no palpable lymphadenopathy in the cervical, axillary or inguinal LUNGS: clear to auscultation and percussion with normal breathing effort HEART: regular rate & rhythm and no murmurs and no lower extremity edema ABDOMEN:  abdomen soft, non-tender and normal bowel sounds MUSCULOSKELETAL: no cyanosis of digits and no clubbing  NEURO: alert & oriented x 3 with fluent speech, no focal motor/sensory deficits EXTREMITIES: No lower extremity edema  LABORATORY DATA:  I have reviewed the data as listed CMP Latest Ref Rng & Units 03/01/2019 02/18/2019 02/03/2019  Glucose 70 - 99 mg/dL 398(H) 288(H) 117(H)  BUN 6 - 20 mg/dL '10 8 9  '$ Creatinine 0.44 - 1.00 mg/dL 0.83 0.70 0.62  Sodium 135 - 145 mmol/L 135 134(L) 136  Potassium 3.5 - 5.1 mmol/L 3.4(L) 3.3(L) 3.6  Chloride 98 - 111 mmol/L 99 99 103  CO2 22 - 32 mmol/L '24 25 26  '$ Calcium 8.9 - 10.3 mg/dL 9.2 8.4(L) 9.1  Total Protein 6.5 - 8.1 g/dL 7.4 7.3 7.5  Total Bilirubin 0.3 - 1.2 mg/dL 0.7 0.7 0.6  Alkaline Phos 38 - 126 U/L 97 90 80  AST 15 - 41 U/L 31 37 31  ALT 0 - 44 U/L 33 34 25    Lab Results  Component Value Date   WBC 6.6 03/01/2019   HGB 13.9 03/01/2019   HCT 41.3 03/01/2019   MCV 85.0 03/01/2019   PLT 152 03/01/2019   NEUTROABS 5.3 03/01/2019    ASSESSMENT & PLAN:  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 Neoadjuvant chemotherapy with either AC followed by THP started 09/13/2015 and completed 12 weeks of Taxol 11/29/2015, Herceptin Perjeta 6; now on Herceptin  2. Bilateral mastectomies and lymph node surgery 02/11/16 3. Adjuvant radiation 04/07/2016 to 05/15/2016 4. Adjuvant antiestrogen therapy with Letrozole started 06/26/2016 5. Neratinib after Herceptin maintenancediscontinued 07/13/2017 when she developed metastatic disease 6.Ibrance with letrozole started March 2019 stopped 04/29/2018 due to progression 7. 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 Whole brain radiation 02/17/2019-03/01/2019  Elevated blood sugars: Metformin to 1000 mg a day. Patient needs to keep a log of her blood sugars.  I reviewed her blood sugar log and there are episodes where she is severely about 300 and even one episode above 400.  Goals of care: Palliation Return to clinic for follow-up in [redacted]weeks along with her injection appointment. Scans to be done before the visit    No orders of the defined types were placed in this encounter.  The patient has a  good understanding of the overall plan. she agrees with it. she will call with any problems that may develop before the next visit here.  Nicholas Lose, MD 03/25/2019  Julious Oka Dorshimer, am acting as scribe for Dr. Nicholas Lose.  I have reviewed the above documentation for accuracy and completeness, and I agree with the above.

## 2019-03-25 ENCOUNTER — Inpatient Hospital Stay: Payer: Self-pay

## 2019-03-25 ENCOUNTER — Other Ambulatory Visit: Payer: Self-pay

## 2019-03-25 ENCOUNTER — Inpatient Hospital Stay (HOSPITAL_BASED_OUTPATIENT_CLINIC_OR_DEPARTMENT_OTHER): Payer: Self-pay | Admitting: Hematology and Oncology

## 2019-03-25 VITALS — BP 134/87 | HR 85 | Temp 98.5°F | Resp 17 | Ht 61.0 in | Wt 181.4 lb

## 2019-03-25 DIAGNOSIS — C50012 Malignant neoplasm of nipple and areola, left female breast: Secondary | ICD-10-CM

## 2019-03-25 DIAGNOSIS — Z95828 Presence of other vascular implants and grafts: Secondary | ICD-10-CM

## 2019-03-25 DIAGNOSIS — Z170 Estrogen receptor positive status [ER+]: Secondary | ICD-10-CM

## 2019-03-25 DIAGNOSIS — C7951 Secondary malignant neoplasm of bone: Secondary | ICD-10-CM

## 2019-03-25 DIAGNOSIS — C50011 Malignant neoplasm of nipple and areola, right female breast: Secondary | ICD-10-CM

## 2019-03-25 DIAGNOSIS — C7800 Secondary malignant neoplasm of unspecified lung: Secondary | ICD-10-CM

## 2019-03-25 LAB — CMP (CANCER CENTER ONLY)
ALT: 35 U/L (ref 0–44)
AST: 42 U/L — ABNORMAL HIGH (ref 15–41)
Albumin: 3.7 g/dL (ref 3.5–5.0)
Alkaline Phosphatase: 76 U/L (ref 38–126)
Anion gap: 12 (ref 5–15)
BUN: 8 mg/dL (ref 6–20)
CO2: 24 mmol/L (ref 22–32)
Calcium: 8.8 mg/dL — ABNORMAL LOW (ref 8.9–10.3)
Chloride: 99 mmol/L (ref 98–111)
Creatinine: 0.8 mg/dL (ref 0.44–1.00)
GFR, Est AFR Am: >60 mL/min (ref >60)
GFR, Estimated: >60 mL/min (ref >60)
Glucose, Bld: 312 mg/dL — ABNORMAL HIGH (ref 70–99)
Potassium: 3.7 mmol/L (ref 3.5–5.1)
Sodium: 135 mmol/L (ref 135–145)
Total Bilirubin: 0.9 mg/dL (ref 0.3–1.2)
Total Protein: 7.2 g/dL (ref 6.5–8.1)

## 2019-03-25 LAB — CBC WITH DIFFERENTIAL (CANCER CENTER ONLY)
Abs Immature Granulocytes: 0.03 10*3/uL (ref 0.00–0.07)
Basophils Absolute: 0.1 10*3/uL (ref 0.0–0.1)
Basophils Relative: 1 %
Eosinophils Absolute: 0.1 10*3/uL (ref 0.0–0.5)
Eosinophils Relative: 2 %
HCT: 39.2 % (ref 36.0–46.0)
Hemoglobin: 13.1 g/dL (ref 12.0–15.0)
Immature Granulocytes: 0 %
Lymphocytes Relative: 14 %
Lymphs Abs: 1 10*3/uL (ref 0.7–4.0)
MCH: 28.4 pg (ref 26.0–34.0)
MCHC: 33.4 g/dL (ref 30.0–36.0)
MCV: 84.8 fL (ref 80.0–100.0)
Monocytes Absolute: 0.5 10*3/uL (ref 0.1–1.0)
Monocytes Relative: 7 %
Neutro Abs: 5.3 10*3/uL (ref 1.7–7.7)
Neutrophils Relative %: 76 %
Platelet Count: 198 10*3/uL (ref 150–400)
RBC: 4.62 MIL/uL (ref 3.87–5.11)
RDW: 14.3 % (ref 11.5–15.5)
WBC Count: 6.9 10*3/uL (ref 4.0–10.5)
nRBC: 0 % (ref 0.0–0.2)

## 2019-03-25 MED ORDER — SODIUM CHLORIDE 0.9% FLUSH
10.0000 mL | INTRAVENOUS | Status: DC | PRN
  Administered 2019-03-25: 10 mL via INTRAVENOUS
  Filled 2019-03-25: qty 10

## 2019-03-25 MED ORDER — FULVESTRANT 250 MG/5ML IM SOLN
INTRAMUSCULAR | Status: AC
  Filled 2019-03-25: qty 5

## 2019-03-25 MED ORDER — FULVESTRANT 250 MG/5ML IM SOLN
500.0000 mg | Freq: Once | INTRAMUSCULAR | Status: AC
  Administered 2019-03-25: 500 mg via INTRAMUSCULAR

## 2019-03-25 MED ORDER — HEPARIN SOD (PORK) LOCK FLUSH 100 UNIT/ML IV SOLN
500.0000 [IU] | Freq: Once | INTRAVENOUS | Status: AC | PRN
  Administered 2019-03-25: 500 [IU] via INTRAVENOUS
  Filled 2019-03-25: qty 5

## 2019-03-25 NOTE — Patient Instructions (Signed)
Fulvestrant injection What is this medicine? FULVESTRANT (ful VES trant) blocks the effects of estrogen. It is used to treat breast cancer. This medicine may be used for other purposes; ask your health care provider or pharmacist if you have questions. COMMON BRAND NAME(S): FASLODEX What should I tell my health care provider before I take this medicine? They need to know if you have any of these conditions:  bleeding disorders  liver disease  low blood counts, like low white cell, platelet, or red cell counts  an unusual or allergic reaction to fulvestrant, other medicines, foods, dyes, or preservatives  pregnant or trying to get pregnant  breast-feeding How should I use this medicine? This medicine is for injection into a muscle. It is usually given by a health care professional in a hospital or clinic setting. Talk to your pediatrician regarding the use of this medicine in children. Special care may be needed. Overdosage: If you think you have taken too much of this medicine contact a poison control center or emergency room at once. NOTE: This medicine is only for you. Do not share this medicine with others. What if I miss a dose? It is important not to miss your dose. Call your doctor or health care professional if you are unable to keep an appointment. What may interact with this medicine?  medicines that treat or prevent blood clots like warfarin, enoxaparin, dalteparin, apixaban, dabigatran, and rivaroxaban This list may not describe all possible interactions. Give your health care provider a list of all the medicines, herbs, non-prescription drugs, or dietary supplements you use. Also tell them if you smoke, drink alcohol, or use illegal drugs. Some items may interact with your medicine. What should I watch for while using this medicine? Your condition will be monitored carefully while you are receiving this medicine. You will need important blood work done while you are taking  this medicine. Do not become pregnant while taking this medicine or for at least 1 year after stopping it. Women of child-bearing potential will need to have a negative pregnancy test before starting this medicine. Women should inform their doctor if they wish to become pregnant or think they might be pregnant. There is a potential for serious side effects to an unborn child. Men should inform their doctors if they wish to father a child. This medicine may lower sperm counts. Talk to your health care professional or pharmacist for more information. Do not breast-feed an infant while taking this medicine or for 1 year after the last dose. What side effects may I notice from receiving this medicine? Side effects that you should report to your doctor or health care professional as soon as possible:  allergic reactions like skin rash, itching or hives, swelling of the face, lips, or tongue  feeling faint or lightheaded, falls  pain, tingling, numbness, or weakness in the legs  signs and symptoms of infection like fever or chills; cough; flu-like symptoms; sore throat  vaginal bleeding Side effects that usually do not require medical attention (report to your doctor or health care professional if they continue or are bothersome):  aches, pains  constipation  diarrhea  headache  hot flashes  nausea, vomiting  pain at site where injected  stomach pain This list may not describe all possible side effects. Call your doctor for medical advice about side effects. You may report side effects to FDA at 1-800-FDA-1088. Where should I keep my medicine? This drug is given in a hospital or clinic and will   not be stored at home. NOTE: This sheet is a summary. It may not cover all possible information. If you have questions about this medicine, talk to your doctor, pharmacist, or health care provider.  2020 Elsevier/Gold Standard (2017-07-23 11:34:41)  

## 2019-03-25 NOTE — Assessment & Plan Note (Signed)
Treatment Summary: 1. Neoadjuvant chemotherapy with either AC followed by THP started 09/13/2015 and completed 12 weeks of Taxol 11/29/2015, Herceptin Perjeta 6; now on Herceptin  2. Bilateral mastectomies and lymph node surgery 02/11/16 3. Adjuvant radiation 04/07/2016 to 05/15/2016 4. Adjuvant antiestrogen therapy with Letrozole started 06/26/2016 5. Neratinib after Herceptin maintenancediscontinued 07/13/2017 when she developed metastatic disease 6.Ibrance with letrozole started March 2019 stopped 04/29/2018 due to progression 7. 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 Whole brain radiation 02/17/2019-03/01/2019  Elevated blood sugars: Metformin to 1000 mg a day. Patient needs to keep a log of her blood sugars.  Goals of care: Palliation Return to clinic for follow-up in [redacted] weeks along with her injection appointment.

## 2019-03-29 NOTE — Progress Notes (Signed)
  Radiation Oncology         [phone]) [phone] ________________________________  Name: Felicia [name] MRN: [id number]  Date: 02/11/2019  DOB: 20-Oct-1960  SIMULATION AND TREATMENT PLANNING NOTE  DIAGNOSIS:     ICD-10-CM   1. Bone metastasis ([place])  C79.51   2. 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  Whole brain XRT 2.  T-spine: T7 3.  Right femur  NARRATIVE:  The patient was brought to the [facility].  Identity was confirmed.  All relevant records and images related to the planned course of therapy were reviewed.   Written consent to proceed with treatment was confirmed which was freely given after reviewing the details related to the planned course of therapy had been reviewed with the patient.  Then, the patient was set-up in a stable reproducible  supine position for radiation therapy.  CT images were obtained.  Surface markings were placed.    Medically necessary complex treatment device(s) for immobilization:   1.  Vac-lock bag 2.  Thermoplastic mask, accuform device .   The CT images were loaded into the planning software.  Then the target and avoidance structures were contoured.  Treatment planning then occurred.  The radiation prescription was entered and confirmed.  A total of 8 complex treatment devices were fabricated which relate to the designed radiation treatment fields. Each of these customized fields/ complex treatment devices will be used on a daily basis during the radiation course. I have requested : Isodose Plan.   PLAN:  The patient will receive 30 Gy in 10 fractions.  ________________________________   Jodelle Gross, MD, PhD

## 2019-03-30 ENCOUNTER — Telehealth: Payer: Self-pay | Admitting: Radiation Oncology

## 2019-03-30 NOTE — Telephone Encounter (Signed)
  Radiation Oncology         [phone] ________________________________  Name: Nehir Qiu MRN: [id number]  Date of Service: 03/30/2019  DOB: 1961-04-09  Post Treatment Telephone Note  Diagnosis:      Recurrent metastatic Bilateral ER positive breast cancer  Interval Since Last Radiation:  4 weeks   02/16/19-03/01/2019:  1. Whole brain XRT to a dose of 30 Gy in 10 fractions. 2.  T-spine centered at T7 to a dose of 30 Gy in 10 fractions 3.  Right pelvis to a dose of 30 Gy in 10 fractions  05/06/2018 - 05/19/2018: The lumbar spine (L2-4) and left pelvis were treated to 30 Gy in 10 fractions of 3 Gy.  04/07/16 - 05/27/16: Left Chest Wall treated to 50.4 Gy in 28 fractions, and then Boosted an additional 10 Gy in 5 fractions. Right Chest Wall treated to 50.4 Gy in 28 fractions, and then Boosted an additional 10 Gy in 5 fractions.  Narrative:  The patient was contacted today for routine follow-up. During treatment she did very well with radiotherapy and did not have significant desquamation.  Impression/Plan: 1.    Recurrent metastatic Bilateral ER positive breast cancer. With the help of [facility] [employer] line a message was left on the patient's daughter's cell phone asking them to call back to coordinate follow up. She will also continue her care with Dr. Lindi Adie in medical oncology.     Carola Rhine, PAC

## 2019-03-30 NOTE — Progress Notes (Signed)
  Radiation Oncology         [phone] ________________________________  Name: Felicia [name] MRN: [id number]  Date: 03/01/2019  DOB: February 05, 1961  End of Treatment Note  Diagnosis:   Stage IV breast cancer     Indication for treatment::  palliative       Radiation treatment dates:   02/16/19 - 03/01/19  Site/dose:    1. Whole brain XRT to a dose of 30 Gy in 10 fractions. 2.  T-spine centered at T7 to a dose of 30 Gy in 10 fractions 3.  Right pelvis to a dose of 30 Gy in 10 fractions  Each of these were treated with an isodose technique.    Narrative: The patient tolerated radiation treatment relatively well.     Plan: The patient has completed radiation treatment. The patient will return to radiation oncology clinic for routine followup in one month. I advised the patient to call or return sooner if they have any questions or concerns related to their recovery or treatment. ________________________________  Jodelle Gross, M.D., Ph.D.

## 2019-04-04 ENCOUNTER — Other Ambulatory Visit: Payer: Self-pay | Admitting: Family Medicine

## 2019-04-04 DIAGNOSIS — I10 Essential (primary) hypertension: Secondary | ICD-10-CM

## 2019-04-13 ENCOUNTER — Other Ambulatory Visit: Payer: Self-pay | Admitting: Pharmacist

## 2019-04-13 DIAGNOSIS — C50011 Malignant neoplasm of nipple and areola, right female breast: Secondary | ICD-10-CM

## 2019-04-13 MED ORDER — ALPELISIB (300 MG DAILY DOSE) 2 X 150 MG PO TBPK: ORAL_TABLET | ORAL | 5 refills | Status: DC

## 2019-04-15 ENCOUNTER — Other Ambulatory Visit: Payer: Self-pay

## 2019-04-15 DIAGNOSIS — C50011 Malignant neoplasm of nipple and areola, right female breast: Secondary | ICD-10-CM

## 2019-04-15 DIAGNOSIS — Z170 Estrogen receptor positive status [ER+]: Secondary | ICD-10-CM

## 2019-04-15 MED ORDER — ALPELISIB (300 MG DAILY DOSE) 2 X 150 MG PO TBPK: ORAL_TABLET | ORAL | 5 refills | Status: DC

## 2019-04-18 ENCOUNTER — Ambulatory Visit (HOSPITAL_COMMUNITY)
Admission: RE | Admit: 2019-04-18 | Discharge: 2019-04-18 | Disposition: A | Discharge status: Home / Self Care | Payer: Self-pay | Source: Ambulatory Visit | Attending: Hematology and Oncology | Admitting: Hematology and Oncology

## 2019-04-18 ENCOUNTER — Telehealth: Payer: Self-pay | Admitting: Radiation Therapy

## 2019-04-18 ENCOUNTER — Encounter (HOSPITAL_COMMUNITY): Payer: Self-pay

## 2019-04-18 ENCOUNTER — Other Ambulatory Visit: Payer: Self-pay

## 2019-04-18 DIAGNOSIS — C7951 Secondary malignant neoplasm of bone: Secondary | ICD-10-CM | POA: Insufficient documentation

## 2019-04-18 DIAGNOSIS — C50012 Malignant neoplasm of nipple and areola, left female breast: Secondary | ICD-10-CM | POA: Insufficient documentation

## 2019-04-18 DIAGNOSIS — C50011 Malignant neoplasm of nipple and areola, right female breast: Secondary | ICD-10-CM | POA: Insufficient documentation

## 2019-04-18 DIAGNOSIS — Z170 Estrogen receptor positive status [ER+]: Secondary | ICD-10-CM | POA: Insufficient documentation

## 2019-04-18 MED ORDER — HEPARIN SOD (PORK) LOCK FLUSH 100 UNIT/ML IV SOLN
INTRAVENOUS | Status: AC
  Filled 2019-04-18: qty 5

## 2019-04-18 MED ORDER — IOHEXOL 300 MG/ML  SOLN
100.0000 mL | Freq: Once | INTRAMUSCULAR | Status: AC | PRN
  Administered 2019-04-18: 10:00:00 100 mL via INTRAVENOUS

## 2019-04-18 MED ORDER — HEPARIN SOD (PORK) LOCK FLUSH 100 UNIT/ML IV SOLN
500.0000 [IU] | Freq: Once | INTRAVENOUS | Status: AC
  Administered 2019-04-18: 500 [IU] via INTRAVENOUS

## 2019-04-18 MED ORDER — SODIUM CHLORIDE (PF) 0.9 % IJ SOLN
INTRAMUSCULAR | Status: AC
  Filled 2019-04-18: qty 50

## 2019-04-18 NOTE — Telephone Encounter (Signed)
Manuela Schwartz- I think she's appropriate for discussion but would want to make sure Dr. Lindi Adie is ok with this first.  Thanks, Bryson Ha

## 2019-04-18 NOTE — Telephone Encounter (Signed)
Opened in Error.

## 2019-04-24 NOTE — Progress Notes (Signed)
Patient Care Team: [name], MD as PCP - General (Family Medicine) Nicholas Lose, MD as Consulting Physician (Hematology and Oncology) Delice Bison, Charlestine Massed, NP as Nurse Practitioner (Hematology and Oncology) Kyung Rudd, MD as Consulting Physician (Radiation Oncology) Rolm Bookbinder, MD as Consulting Physician (General Surgery)  DIAGNOSIS:    ICD-10-CM   1. Bone metastasis ([place])  C79.51   2. 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, estrogen receptor positive ([place])  08/16/2015 Mammogram   Left breast LIQ 4.2 cm, 4 mm oval mass 12:00, 1.2 cm mass left UOQ, enlarged left axillary lymph node   08/16/2015 Mammogram   Right breast UIQ: 3.9 cm irregular mass, 1.5 cm mass in the right LOQ, 9 mm mass right UOQ, 6 mm mass right LIQ, enlarged lymph node Rt axilla   08/20/2015 Initial Diagnosis   Right breast biopsy 1:00: IDC grade 2 ER 95%, PR 2%, Ki 67 15%; HER-2 negative ratio 1.04; 10:00: IDC grade 2-3 dissimilar to 1:00 biopsy 10:00: ER 95%, PR 0%, K67: 25%, HER-2 positive ratio 6.42 right axillary lymph node positive for metastatic cancer   08/20/2015 Procedure   Left biopsy 8:00: IDC with DCIS, grade 2; ER 95%, PR 95%, Ki-67 30%, HER-2 negative ratio 1.2; 12:00: IDC with DCIS, grade 1 ER 95%, PR 95%, Ki-67 10% HER-2 negative ratio 1.14 not similar to this biopsy, left axillary lymph node positive,    09/13/2015 - 01/17/2016 Neo-Adjuvant Chemotherapy   Neoadjuvant dose dense Adriamycin and Cytoxan followed by Taxol Herceptin Perjeta, followed by Herceptin maintenance   01/21/2016 Breast MRI   Bilateral breast masses significantly decreased in size with mild residual NME, complete resolution of numerous other nodules in both breasts, bilateral axillary lymph nodes and bilateral retropectoral  lymph nodes decreased in size   02/11/2016 Surgery   Right mastectomy: IDC 2 nodules, 2.5, 1 cm, margins negative, 3/7 lymph nodes positive T2 N1a stage II B, ER 95%, PR 2%, HER-2 negative, Ki-67 15% RCB burden 3.1 and 2.9 class II   02/11/2016 Surgery   Left mastectomy: IDC 3.1 cm with DCIS, margins negative, 0/8 lymph nodes, T2 N0 stage II a, RCB burden 2.18, class II, ER 100%, PR 95%, HER-2 negative ratio 1.2, Ki-67 30%   04/07/2016 - 05/15/2016 Radiation Therapy   Adjuvant radiation   06/26/2016 - 07/13/2017 Anti-estrogen oral therapy   Letrozole 2.5 mg daily   11/20/2016 Miscellaneous   Neratinib 240 mg daily discontinued 07/13/2017 when she presented with metastatic disease to the lungs   07/13/2017 Relapse/Recurrence   Progression of right and left hilar lymph nodes and bilateral lung nodules   07/13/2017 -  Anti-estrogen oral therapy   Ibrance with letrozole   10/24/2017 Imaging   CT chest abdomen pelvis: Interval decrease in the left hilar node.  The more superior left hilar node is slightly increased in size.  Interval decrease in the size of multiple lung nodules   04/26/2018 Imaging   Interval development of multiple lytic bone metastases.  Lesion in the left acetabulum measures 5.5 cm, new lesion L3 extending to the left pedicle.  Perihilar nodule left lower lobe increased in size 2.8 cm from 1.8 cm, other nodules not changed.  Similar appearance of left hilar adenopathy, small perinephric soft tissue nodule increased in size   05/07/2018 -  05/19/2018 Radiation Therapy   Radiation to L3 (30 Gy in 10 Fx)   01/04/2019 Progression   Bronchoscopic lung biopsy (Dr. Clyda Greener): metastatic carcinoma, HER-2 negative by FISH, ER+ 50%, PR+ 10%.   02/21/2019 - 03/01/2019 Radiation Therapy   Whole brain radiation     CHIEF COMPLIANT: Follow-up of metastatic breast cancer onFaslodex with Alpelisib  INTERVAL HISTORY: Felicia [name] is a 58 y.o. with above-mentioned history of  metastatic breast cancercurrently on treatment withFaslodex with Alpelisib.CT CAP on 04/18/19 showed progressive osseous metastatic disease, new multifocal hepatic metastases, new small bilateral adrenal nodules, and volume loss and opacity in the left lower lobe suspicious for metastatic disease. She presents to the clinic todayto review her scans and discuss further treatment.  Patient is generally weak and is using wheelchair for ambulation. I used interpreter for today's discussion.  REVIEW OF SYSTEMS:   Constitutional: Generalized fatigue and weakness Eyes: Denies blurriness of vision Ears, nose, mouth, throat, and face: Denies mucositis or sore throat Respiratory: Denies cough, dyspnea or wheezes Cardiovascular: Denies palpitation, chest discomfort Gastrointestinal: Denies nausea, heartburn or change in bowel habits Skin: Denies abnormal skin rashes Lymphatics: Denies new lymphadenopathy or easy bruising Neurological: Denies numbness, tingling or new weaknesses Behavioral/Psych: Mood is stable, no new changes  Extremities: No lower extremity edema Breast: denies any pain or lumps or nodules in either breasts All other systems were reviewed with the patient and are negative.  I have reviewed the past medical history, past surgical history, social history and family history with the patient and they are unchanged from previous note.  ALLERGIES:  is allergic to no known allergies.  MEDICATIONS:  Current Outpatient Medications  Medication Sig Dispense Refill  . albuterol (VENTOLIN HFA) 108 (90 Base) MCG/ACT inhaler Inhale 2 puffs into the lungs every 4 (four) hours as needed for wheezing or shortness of breath. 1 Inhaler 5  . Calcium Carb-Cholecalciferol (CALCIUM 600+D) 600-800 MG-UNIT TABS Take 1 tablet by mouth daily.    . Cholecalciferol (VITAMIN D-3) 5000 units TABS Take 1 tablet by mouth daily.    . furosemide (LASIX) 20 MG tablet 20 mg once daily for 4 days for swelling, stop  then restart if swelling returns 30 tablet 0  . glucose blood (COOL BLOOD GLUCOSE TEST STRIPS) test strip Use as instructed 100 each 12  . letrozole (FEMARA) 2.5 MG tablet Take 1 tablet (2.5 mg total) by mouth daily. 90 tablet 3  . lidocaine-prilocaine (EMLA) cream Apply 1 application topically as needed (Apply 2- hours prior to access of port). 30 g 6  . lisinopril (PRINIVIL,ZESTRIL) 20 MG tablet Take 1 tablet (20 mg total) by mouth daily. 30 tablet 6  . metFORMIN (GLUMETZA) 1000 MG (MOD) 24 hr tablet Take 1 tablet (1,000 mg total) by mouth daily with breakfast. 30 tablet 3  . ondansetron (ZOFRAN ODT) 8 MG disintegrating tablet Take 1 tablet (8 mg total) by mouth every 8 (eight) hours as needed for nausea or vomiting. 30 tablet 3  . oxyCODONE-acetaminophen (PERCOCET/ROXICET) 5-325 MG tablet Take 1 tablet by mouth every 6 (six) hours as needed for severe pain. 60 tablet 0  . pantoprazole (PROTONIX) 40 MG tablet Take 1 tablet (40 mg total) by mouth daily. 30 tablet 3   No current facility-administered medications for this visit.   Facility-Administered Medications Ordered in Other Visits  Medication Dose Route Frequency Provider Last Rate Last Admin  . sodium chloride flush (NS) 0.9 % injection 10 mL  10 mL Intravenous PRN Kayler Buckholtz,  Loleta Dicker, MD   10 mL at 04/14/17 0831    PHYSICAL EXAMINATION: ECOG PERFORMANCE STATUS: 2 - Symptomatic, <50% confined to bed  Vitals:   04/25/19 1155  BP: (!) 155/92  Pulse: 81  Resp: 17  Temp: 97.8 F (36.6 C)  SpO2: 99%   Filed Weights   04/25/19 1155  Weight: 176 lb 1.6 oz (79.9 kg)    GENERAL: alert, no distress and comfortable SKIN: skin color, texture, turgor are normal, no rashes or significant lesions EYES: normal, Conjunctiva are pink and non-injected, sclera clear OROPHARYNX: no exudate, no erythema and lips, buccal mucosa, and tongue normal  NECK: supple, thyroid normal size, non-tender, without nodularity LYMPH: no palpable lymphadenopathy in  the cervical, axillary or inguinal LUNGS: clear to auscultation and percussion with normal breathing effort HEART: regular rate & rhythm and no murmurs and no lower extremity edema ABDOMEN: abdomen soft, non-tender and normal bowel sounds MUSCULOSKELETAL: no cyanosis of digits and no clubbing  NEURO: alert & oriented x 3 with fluent speech, no focal motor/sensory deficits EXTREMITIES: No lower extremity edema  LABORATORY DATA:  I have reviewed the data as listed CMP Latest Ref Rng & Units 04/25/2019 03/25/2019 03/01/2019  Glucose 70 - 99 mg/dL 207(H) 312(H) 398(H)  BUN 6 - 20 mg/dL 5(L) 8 10  Creatinine 0.44 - 1.00 mg/dL 0.72 0.80 0.83  Sodium 135 - 145 mmol/L 137 135 135  Potassium 3.5 - 5.1 mmol/L 3.4(L) 3.7 3.4(L)  Chloride 98 - 111 mmol/L 100 99 99  CO2 22 - 32 mmol/L _0 Calcium 8.9 - 10.3 mg/dL 9.3 8.8(L) 9.2  Total Protein 6.5 - 8.1 g/dL 7.2 7.2 7.4  Total Bilirubin 0.3 - 1.2 mg/dL 0.8 0.9 0.7  Alkaline Phos 38 - 126 U/L 75 76 97  AST 15 - 41 U/L 59(H) 42(H) 31  ALT 0 - 44 U/L 30 35 33    Lab Results  Component Value Date   WBC 8.4 04/25/2019   HGB 12.8 04/25/2019   HCT 38.3 04/25/2019   MCV 85.9 04/25/2019   PLT 259 04/25/2019   NEUTROABS 6.3 04/25/2019    ASSESSMENT & PLAN:  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 Neoadjuvant chemotherapy with either AC followed by THP started 09/13/2015 and completed 12 weeks of Taxol 11/29/2015, Herceptin Perjeta 6; now on Herceptin  2. Bilateral mastectomies and lymph node surgery 02/11/16 3. Adjuvant radiation 04/07/2016 to 05/15/2016 4. Adjuvant antiestrogen therapy with Letrozole started 06/26/2016 5. Neratinib after Herceptin maintenancediscontinued 07/13/2017 when she developed metastatic disease 6.Ibrance with letrozole started March 2019 stopped 04/29/2018 due to progression 7. 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 Whole brain radiation10/22/2020-03/01/2019  04/25/2019: CT CAP: Progressive multifocal bone metastatic disease.  New compression fracture T7 vertebral body which is pathologic.  Lesion in the right femoral lesser trochanter will also risk for pathologic fracture.  New multifocal hepatic metastatic disease with new multiple nodules 12 mm, 10 mm, more than 10 lesions noted, new bilateral adrenal mets, left lower lung volume loss and opacity suspicious  for metastatic disease.  Radiology review: I discussed with the patient the results of the CT scans and provided them with a copy of the report. The patient appears to continue to progress on current treatment.  I recommended hospice care for her given the rapidity of progression of disease. We will cancel  injection appointments Return to clinic in 3 months for follow-up.  However if she is too frail then we can cancel the follow-up.  I anticipate that she would have less than 6 months to live.   No orders of the defined types were placed in this encounter.  The patient has a good understanding of the overall plan. she agrees with it. she will call with any problems that may develop before the next visit here.  Nicholas Lose, MD 04/25/2019  Julious Oka Dorshimer, am acting as scribe for Dr. Nicholas Lose.  I have reviewed the above document  for accuracy and completeness, and I agree with the above.

## 2019-04-25 ENCOUNTER — Inpatient Hospital Stay (HOSPITAL_BASED_OUTPATIENT_CLINIC_OR_DEPARTMENT_OTHER): Payer: Self-pay | Admitting: Hematology and Oncology

## 2019-04-25 ENCOUNTER — Inpatient Hospital Stay: Payer: Self-pay

## 2019-04-25 ENCOUNTER — Other Ambulatory Visit: Payer: Self-pay

## 2019-04-25 ENCOUNTER — Inpatient Hospital Stay: Payer: Self-pay | Attending: Hematology and Oncology

## 2019-04-25 ENCOUNTER — Ambulatory Visit: Payer: Self-pay

## 2019-04-25 VITALS — BP 155/92 | HR 81 | Temp 97.8°F | Resp 17 | Ht 61.0 in | Wt 176.1 lb

## 2019-04-25 DIAGNOSIS — C7951 Secondary malignant neoplasm of bone: Secondary | ICD-10-CM

## 2019-04-25 DIAGNOSIS — Z79899 Other long term (current) drug therapy: Secondary | ICD-10-CM | POA: Insufficient documentation

## 2019-04-25 DIAGNOSIS — Z79811 Long term (current) use of aromatase inhibitors: Secondary | ICD-10-CM | POA: Insufficient documentation

## 2019-04-25 DIAGNOSIS — Z95828 Presence of other vascular implants and grafts: Secondary | ICD-10-CM

## 2019-04-25 DIAGNOSIS — Z9221 Personal history of antineoplastic chemotherapy: Secondary | ICD-10-CM | POA: Insufficient documentation

## 2019-04-25 DIAGNOSIS — C50011 Malignant neoplasm of nipple and areola, right female breast: Secondary | ICD-10-CM | POA: Insufficient documentation

## 2019-04-25 DIAGNOSIS — Z9013 Acquired absence of bilateral breasts and nipples: Secondary | ICD-10-CM | POA: Insufficient documentation

## 2019-04-25 DIAGNOSIS — Z170 Estrogen receptor positive status [ER+]: Secondary | ICD-10-CM | POA: Insufficient documentation

## 2019-04-25 DIAGNOSIS — C7800 Secondary malignant neoplasm of unspecified lung: Secondary | ICD-10-CM | POA: Insufficient documentation

## 2019-04-25 DIAGNOSIS — C787 Secondary malignant neoplasm of liver and intrahepatic bile duct: Secondary | ICD-10-CM | POA: Insufficient documentation

## 2019-04-25 DIAGNOSIS — C773 Secondary and unspecified malignant neoplasm of axilla and upper limb lymph nodes: Secondary | ICD-10-CM | POA: Insufficient documentation

## 2019-04-25 DIAGNOSIS — Z923 Personal history of irradiation: Secondary | ICD-10-CM | POA: Insufficient documentation

## 2019-04-25 DIAGNOSIS — C7972 Secondary malignant neoplasm of left adrenal gland: Secondary | ICD-10-CM | POA: Insufficient documentation

## 2019-04-25 DIAGNOSIS — C50012 Malignant neoplasm of nipple and areola, left female breast: Secondary | ICD-10-CM | POA: Insufficient documentation

## 2019-04-25 DIAGNOSIS — C7971 Secondary malignant neoplasm of right adrenal gland: Secondary | ICD-10-CM | POA: Insufficient documentation

## 2019-04-25 DIAGNOSIS — Z7984 Long term (current) use of oral hypoglycemic drugs: Secondary | ICD-10-CM | POA: Insufficient documentation

## 2019-04-25 LAB — CBC WITH DIFFERENTIAL (CANCER CENTER ONLY)
Abs Immature Granulocytes: 0.04 10*3/uL (ref 0.00–0.07)
Basophils Absolute: 0 10*3/uL (ref 0.0–0.1)
Basophils Relative: 1 %
Eosinophils Absolute: 0.2 10*3/uL (ref 0.0–0.5)
Eosinophils Relative: 2 %
HCT: 38.3 % (ref 36.0–46.0)
Hemoglobin: 12.8 g/dL (ref 12.0–15.0)
Immature Granulocytes: 1 %
Lymphocytes Relative: 15 %
Lymphs Abs: 1.3 10*3/uL (ref 0.7–4.0)
MCH: 28.7 pg (ref 26.0–34.0)
MCHC: 33.4 g/dL (ref 30.0–36.0)
MCV: 85.9 fL (ref 80.0–100.0)
Monocytes Absolute: 0.6 10*3/uL (ref 0.1–1.0)
Monocytes Relative: 7 %
Neutro Abs: 6.3 10*3/uL (ref 1.7–7.7)
Neutrophils Relative %: 74 %
Platelet Count: 259 10*3/uL (ref 150–400)
RBC: 4.46 MIL/uL (ref 3.87–5.11)
RDW: 14.1 % (ref 11.5–15.5)
WBC Count: 8.4 10*3/uL (ref 4.0–10.5)
nRBC: 0 % (ref 0.0–0.2)

## 2019-04-25 LAB — CMP (CANCER CENTER ONLY)
ALT: 30 U/L (ref 0–44)
AST: 59 U/L — ABNORMAL HIGH (ref 15–41)
Albumin: 3.5 g/dL (ref 3.5–5.0)
Alkaline Phosphatase: 75 U/L (ref 38–126)
Anion gap: 10 (ref 5–15)
BUN: 5 mg/dL — ABNORMAL LOW (ref 6–20)
CO2: 27 mmol/L (ref 22–32)
Calcium: 9.3 mg/dL (ref 8.9–10.3)
Chloride: 100 mmol/L (ref 98–111)
Creatinine: 0.72 mg/dL (ref 0.44–1.00)
GFR, Est AFR Am: >60 mL/min (ref >60)
GFR, Estimated: >60 mL/min (ref >60)
Glucose, Bld: 207 mg/dL — ABNORMAL HIGH (ref 70–99)
Potassium: 3.4 mmol/L — ABNORMAL LOW (ref 3.5–5.1)
Sodium: 137 mmol/L (ref 135–145)
Total Bilirubin: 0.8 mg/dL (ref 0.3–1.2)
Total Protein: 7.2 g/dL (ref 6.5–8.1)

## 2019-04-25 MED ORDER — HEPARIN SOD (PORK) LOCK FLUSH 100 UNIT/ML IV SOLN
500.0000 [IU] | Freq: Once | INTRAVENOUS | Status: AC | PRN
  Administered 2019-04-25: 500 [IU] via INTRAVENOUS
  Filled 2019-04-25: qty 5

## 2019-04-25 MED ORDER — SODIUM CHLORIDE 0.9% FLUSH
10.0000 mL | INTRAVENOUS | Status: DC | PRN
  Administered 2019-04-25: 12:00:00 10 mL via INTRAVENOUS
  Filled 2019-04-25: qty 10

## 2019-04-25 NOTE — Progress Notes (Signed)
Hospice consult initiated.  Referral successfully faxed over to [facility] at [phone].  Referral placed for radiation for right hip per MD recommendations.

## 2019-04-25 NOTE — Assessment & Plan Note (Signed)
Treatment Summary: 1. Neoadjuvant chemotherapy with either AC followed by THP started 09/13/2015 and completed 12 weeks of Taxol 11/29/2015, Herceptin Perjeta 6; now on Herceptin  2. Bilateral mastectomies and lymph node surgery 02/11/16 3. Adjuvant radiation 04/07/2016 to 05/15/2016 4. Adjuvant antiestrogen therapy with Letrozole started 06/26/2016 5. Neratinib after Herceptin maintenancediscontinued 07/13/2017 when she developed metastatic disease 6.Ibrance with letrozole started March 2019 stopped 04/29/2018 due to progression 7. 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 Whole brain radiation10/22/2020-03/01/2019  04/25/2019: CT CAP: Progressive multifocal bone metastatic disease.  New compression fracture T7 vertebral body which is pathologic.  Lesion in the right femoral lesser trochanter will also risk for pathologic fracture.  New multifocal hepatic metastatic disease with new multiple nodules 12 mm, 10 mm, more than 10 lesions noted, new bilateral adrenal mets, left lower lung volume loss and opacity suspicious for metastatic disease.  Radiology review: I discussed with the patient the results of the CT scans and provided them with a copy of the report. The patient appears to continue to progress on current treatment.  I recommended hospice care for her given the rapidity  of progression of disease.

## 2019-04-26 ENCOUNTER — Telehealth: Payer: Self-pay | Admitting: Radiation Oncology

## 2019-04-26 ENCOUNTER — Encounter: Payer: Self-pay | Admitting: Radiation Oncology

## 2019-04-26 NOTE — Telephone Encounter (Signed)
New message: ° ° °LVM for patient to return call to schedule appt from referral received. °

## 2019-04-27 ENCOUNTER — Telehealth: Payer: Self-pay

## 2019-04-27 ENCOUNTER — Telehealth: Payer: Self-pay | Admitting: Hematology and Oncology

## 2019-04-27 NOTE — Telephone Encounter (Signed)
Felicia [name] from [facility] contact office to update that she has been unable to get in contact

## 2019-04-27 NOTE — Telephone Encounter (Signed)
No los during time of check out.

## 2019-04-27 NOTE — Telephone Encounter (Signed)
Daisy from [facility] contacted office to update that she has been unable to get in contact with patient.   RN attempted contact, no success.  Almyra Free, [place] interpreter notified.  Almyra Free will attempt patient's daughter to give her hospice contact number of [phone].

## 2019-05-03 ENCOUNTER — Telehealth: Payer: Self-pay | Admitting: Adult Health Nurse Practitioner

## 2019-05-03 ENCOUNTER — Telehealth: Payer: Self-pay

## 2019-05-03 ENCOUNTER — Other Ambulatory Visit: Payer: Self-pay

## 2019-05-03 ENCOUNTER — Ambulatory Visit: Payer: Self-pay | Attending: Family Medicine | Admitting: Family Medicine

## 2019-05-03 VITALS — BP 158/93 | HR 115 | Temp 98.0°F | Ht 61.0 in | Wt 173.0 lb

## 2019-05-03 DIAGNOSIS — C7951 Secondary malignant neoplasm of bone: Secondary | ICD-10-CM

## 2019-05-03 DIAGNOSIS — I10 Essential (primary) hypertension: Secondary | ICD-10-CM

## 2019-05-03 DIAGNOSIS — E1169 Type 2 diabetes mellitus with other specified complication: Secondary | ICD-10-CM

## 2019-05-03 LAB — POCT GLYCOSYLATED HEMOGLOBIN (HGB A1C): HbA1c, POC (controlled diabetic range): 7.2 % — AB (ref 0.0–7.0)

## 2019-05-03 LAB — GLUCOSE, POCT (MANUAL RESULT ENTRY): POC Glucose: 141 mg/dl — AB (ref 70–99)

## 2019-05-03 MED ORDER — LISINOPRIL 20 MG PO TABS: 20.0000 mg | ORAL_TABLET | Freq: Every day | ORAL | 6 refills | Status: AC

## 2019-05-03 MED ORDER — POLYETHYLENE GLYCOL 3350 17 GM/SCOOP PO POWD: 17.0000 g | Freq: Every day | ORAL | 1 refills | Status: AC

## 2019-05-03 NOTE — Progress Notes (Signed)
[id number] A1C-7.2

## 2019-05-03 NOTE — Telephone Encounter (Signed)
Met with the patient and her daughter, Olin Hauser, when they were in the clinic today.  Olin Hauser said that they have not heard from palliative care yet.  Olin Hauser said that she works second shift and his home with her mother until she leaves for work. Her father is with her mother when she is working.  Olin Hauser is [place] speaking and she confirmed her phone number # [phone].  They remain interested in the services provided by hospice/palliative care and would benefit from a wheelchair.   Patient currently uninsured.   Call placed to [facility], spoke to [place] who confirmed that they had received a referral but have not been able to reach the patient. Referral had been received in 01/2019. Explained to her that the daughter can be called as she is [place] speaking and it would be best to call her prior to starting her shift at work.    This CM informed Olin Hauser that she should be receiving a call from [facility] and she was very Patent attorney.  Call received from [facility] who confirmed that she would be reaching out to the patient's daughter.

## 2019-05-03 NOTE — Progress Notes (Signed)
Subjective:  Patient ID: Felicia [name], female    DOB: [birth year]  [name]: 58 y.o. MRN: [id number]  CC: Hypertension   HPI Felicia [name] is 59 year old female with a history of GERD, bilateral breast cancer diagnosed in 07/2015 (status post bilateral mastectomy, chemotherapy and radiation), status post bilateral lymph node excision, now with multifocal osseous (status post pelvic radiation), hepatic, brain (status post whole brain radiation) and  lung metastasis here for follow-up visit.  I had last seen her 13 months ago. Since then she was diagnosed with type 2 diabetes mellitus (A1c 7.2) and was placed on metformin by her oncologist.  Last oncology visit was on 04/25/2019 and notes reviewed-referred to hospice due to progression of disease despite ongoing treatment. Imaging results are as follows: MRI brain 01/2019: IMPRESSION: 1. Numerous subcentimeter foci of supratentorial and infratentorial enhancement consistent with left leptomeningeal metastatic disease. 2. Numerous skull metastases.  CT abdomen and pelvis with contrast 04/18/2019: IMPRESSION: 1. Progressive multifocal osseous metastatic disease. There is a new marked compression fracture involving the T7 vertebral body which may be pathologic. There is a lesion within the right femoral lesser trochanter which is at risk for pathologic fracture. 2. New multifocal hepatic metastatic disease. 3. New small bilateral adrenal metastases. 4. Progressive volume loss and opacity in the left lower lobe with possible low-density nodules within the collapsed portion of the left lower lobe, also suspicious for metastatic disease. Other nodules within the aerated portions of the lungs have not significantly changed. 5. Emphysema (ICD10-J43.9).   She is accompanied by her daughter today and reports fatigue, decreased appetite but states she is holding up.  She resides with her husband and her daughter  and they are yet to receive services from hospice.  Past Medical History:  Diagnosis Date  . Asthma   . bilateral breast ca dx'd 2017   breast  . GERD (gastroesophageal reflux disease)   . Hypertension   . Pulmonary nodule     Past Surgical History:  Procedure Laterality Date  . MASTECTOMY MODIFIED RADICAL Bilateral 02/11/2016  . MASTECTOMY MODIFIED RADICAL Bilateral 02/11/2016   Procedure: BILATERAL MASTECTOMY MODIFIED RADICAL;  Surgeon: Rolm Bookbinder, MD;  Location: [facility];  Service: General;  Laterality: Bilateral;  . PORTACATH PLACEMENT N/A 09/04/2015   Procedure: INSERTION PORT-A-CATH WITH [place];  Surgeon: Rolm Bookbinder, MD;  Location: [facility];  Service: General;  Laterality: N/A;  . TUBAL LIGATION    . VIDEO BRONCHOSCOPY Bilateral 01/04/2019   Procedure: VIDEO BRONCHOSCOPY WITH FLUORO;  Surgeon: Rigoberto Noel, MD;  Location: [facility];  Service: Cardiopulmonary;  Laterality: Bilateral;    Family History  Problem Relation [name] of Onset  . Diabetes Mother     Allergies  Allergen Reactions  . No Known Allergies     Outpatient Medications Prior to Visit  Medication Sig Dispense Refill  . albuterol (VENTOLIN HFA) 108 (90 Base) MCG/ACT inhaler Inhale 2 puffs into the lungs every 4 (four) hours as needed for wheezing or shortness of breath. 1 Inhaler 5  . Calcium Carb-Cholecalciferol (CALCIUM 600+D) 600-800 MG-UNIT TABS Take 1 tablet by mouth daily.    . Cholecalciferol (VITAMIN D-3) 5000 units TABS Take 1 tablet by mouth daily.    . furosemide (LASIX) 20 MG tablet 20 mg once daily for 4 days for swelling, stop then restart if swelling returns 30 tablet 0  . glucose blood (COOL BLOOD GLUCOSE TEST STRIPS) test strip Use as instructed 100 each 12  .  letrozole (FEMARA) 2.5 MG tablet Take 1 tablet (2.5 mg total) by mouth daily. 90 tablet 3  . lidocaine-prilocaine (EMLA) cream Apply 1 application topically as needed (Apply 2- hours prior to access of port). 30  g 6  . metFORMIN (GLUMETZA) 1000 MG (MOD) 24 hr tablet Take 1 tablet (1,000 mg total) by mouth daily with breakfast. 30 tablet 3  . ondansetron (ZOFRAN ODT) 8 MG disintegrating tablet Take 1 tablet (8 mg total) by mouth every 8 (eight) hours as needed for nausea or vomiting. 30 tablet 3  . oxyCODONE-acetaminophen (PERCOCET/ROXICET) 5-325 MG tablet Take 1 tablet by mouth every 6 (six) hours as needed for severe pain. 60 tablet 0  . pantoprazole (PROTONIX) 40 MG tablet Take 1 tablet (40 mg total) by mouth daily. 30 tablet 3  . lisinopril (PRINIVIL,ZESTRIL) 20 MG tablet Take 1 tablet (20 mg total) by mouth daily. 30 tablet 6   Facility-Administered Medications Prior to Visit  Medication Dose Route Frequency Provider Last Rate Last Admin  . sodium chloride flush (NS) 0.9 % injection 10 mL  10 mL Intravenous PRN Nicholas Lose, MD   10 mL at 04/14/17 0831     ROS Review of Systems  Constitutional: Positive for appetite change and fatigue. Negative for activity change.  HENT: Negative for congestion, sinus pressure and sore throat.   Eyes: Negative for visual disturbance.  Respiratory: Negative for cough, chest tightness, shortness of breath and wheezing.   Cardiovascular: Negative for chest pain and palpitations.  Gastrointestinal: Negative for abdominal distention, abdominal pain and constipation.  Endocrine: Negative for polydipsia.  Genitourinary: Negative for dysuria and frequency.  Musculoskeletal:       See HPI  Skin: Negative for rash.  Neurological: Negative for tremors, light-headedness and numbness.  Hematological: Does not bruise/bleed easily.  Psychiatric/Behavioral: Negative for agitation and behavioral problems.    Objective:  BP (!) 158/93   Pulse (!) 115   Temp 98 F (36.7 C) (Oral)   Ht 5\' 1"  (1.549 m)   Wt 173 lb (78.5 kg)   SpO2 97%   BMI 32.69 kg/m   BP/Weight 05/03/2019 04/25/2019 [id number]  Systolic BP 0000000 [id number]  Diastolic BP 93 92 87  Wt. (Lbs) 173 176.1  181.4  BMI 32.69 33.27 34.28      Physical Exam Constitutional:      Appearance: She is well-developed.  Neck:     Vascular: No JVD.  Cardiovascular:     Rate and Rhythm: Tachycardia present.     Heart sounds: Normal heart sounds. No murmur.  Pulmonary:     Effort: Pulmonary effort is normal.     Breath sounds: Normal breath sounds. No wheezing or rales.  Chest:     Chest wall: No tenderness.  Abdominal:     General: Bowel sounds are normal. There is no distension.     Palpations: Abdomen is soft. There is no mass.     Tenderness: There is no abdominal tenderness.  Musculoskeletal:        General: Normal range of motion.     Right lower leg: No edema.     Left lower leg: No edema.  Skin:    Comments: R anterior chest wall nodule  Neurological:     Mental Status: She is alert and oriented to person, place, and time.  Psychiatric:        Mood and Affect: Mood normal.     CMP Latest Ref Rng & Units 04/25/2019 03/25/2019 03/01/2019  Glucose 70 -  99 mg/dL 207(H) 312(H) 398(H)  BUN 6 - 20 mg/dL 5(L) 8 10  Creatinine 0.44 - 1.00 mg/dL 0.72 0.80 0.83  Sodium 135 - 145 mmol/L 137 135 135  Potassium 3.5 - 5.1 mmol/L 3.4(L) 3.7 3.4(L)  Chloride 98 - 111 mmol/L 100 99 99  CO2 22 - 32 mmol/L 27 24 24   Calcium 8.9 - 10.3 mg/dL 9.3 8.8(L) 9.2  Total Protein 6.5 - 8.1 g/dL 7.2 7.2 7.4  Total Bilirubin 0.3 - 1.2 mg/dL 0.8 0.9 0.7  Alkaline Phos 38 - 126 U/L 75 76 97  AST 15 - 41 U/L 59(H) 42(H) 31  ALT 0 - 44 U/L 30 35 33    Lipid Panel  No results found for: CHOL, TRIG, HDL, CHOLHDL, VLDL, LDLCALC, LDLDIRECT  CBC    Component Value Date/Time   WBC 8.4 04/25/2019 1136   WBC 3.1 (L) 09/25/2017 0835   RBC 4.46 04/25/2019 1136   HGB 12.8 04/25/2019 1136   HGB 13.8 04/14/2017 0919   HCT 38.3 04/25/2019 1136   HCT 41.0 04/14/2017 0919   PLT 259 04/25/2019 1136   PLT 211 04/14/2017 0919   MCV 85.9 04/25/2019 1136   MCV 86.6 04/14/2017 0919   MCH 28.7 04/25/2019 1136    MCHC 33.4 04/25/2019 1136   RDW 14.1 04/25/2019 1136   RDW 13.2 04/14/2017 0919   LYMPHSABS 1.3 04/25/2019 1136   LYMPHSABS 1.7 04/14/2017 0919   MONOABS 0.6 04/25/2019 1136   MONOABS 0.5 04/14/2017 0919   EOSABS 0.2 04/25/2019 1136   EOSABS 0.1 04/14/2017 0919   BASOSABS 0.0 04/25/2019 1136   BASOSABS 0.0 04/14/2017 0919    Lab Results  Component Value Date   HGBA1C 7.2 (A) 05/03/2019    Assessment & Plan:   1. Type 2 diabetes mellitus with other specified complication, without long-term current use of insulin (HCC) Controlled with A1c of 7.2 Continue Metformin Continue diabetic diet and lifestyle modifications - Glucose (CBG) - HgB A1c  2. Essential hypertension Elevated No regimen change today Continue DASH diet, lifestyle modifications - lisinopril (ZESTRIL) 20 MG tablet; Take 1 tablet (20 mg total) by mouth daily.  Dispense: 30 tablet; Refill: 6  3. Bone metastasis ([place]) Breast cancer with skull, osseous metastases and compression fracture, hepatic metastases and pleural metastases. Poor prognosis due to progression of disease She had been referred to hospice but is yet to hear from them I have had the case manager perform a needs assessment and contact hospice to set up services Wheelchair dependent-she does not have 1 and we will arrange with hospice to have this delivered to her     Meds ordered this encounter  Medications  . polyethylene glycol powder (GLYCOLAX/MIRALAX) 17 GM/SCOOP powder    Sig: Take 17 g by mouth daily.    Dispense:  3350 g    Refill:  1  . lisinopril (ZESTRIL) 20 MG tablet    Sig: Take 1 tablet (20 mg total) by mouth daily.    Dispense:  30 tablet    Refill:  6    Follow-up: Return in about 3 months (around 08/01/2019) for medical conditions - virtual.       [name], MD, FAAFP. [facility], [place]   05/03/2019, 1:08 PM

## 2019-05-04 ENCOUNTER — Encounter: Payer: Self-pay | Admitting: Family Medicine

## 2019-05-04 ENCOUNTER — Telehealth: Payer: Self-pay | Admitting: Radiation Oncology

## 2019-05-04 ENCOUNTER — Telehealth: Payer: Self-pay | Admitting: Adult Health Nurse Practitioner

## 2019-05-04 NOTE — Telephone Encounter (Signed)
Called daughter again to schedule Palliative Consult, no answer - left message with reason for call along with my contact information

## 2019-05-04 NOTE — Telephone Encounter (Signed)
New message:   LVM for patient's daughter to call back to schedule appt for mother from referral received.

## 2019-05-04 NOTE — Telephone Encounter (Signed)
Called daughter Olin Hauser to schedule Palliative Consult, no answer went straight to voicemail.  I will try to call daughter again tomorrow at an earlier time due to her working second shift.

## 2019-05-18 ENCOUNTER — Telehealth: Payer: Self-pay

## 2019-05-18 ENCOUNTER — Telehealth: Payer: Self-pay | Admitting: Adult Health Nurse Practitioner

## 2019-05-18 NOTE — Telephone Encounter (Signed)
Dr. Allie Dimmer Monguilod tried calling the patient and the patient's daughter again today to schedule the Palliative Consult, with no answer at either number.  Dr. Clifton James requested that we cancel the referral after he has made multiple phone calls since Oct. 2020 to try and schedule the Consult.  Will notify Dr. Geralyn Flash office of this.

## 2019-05-19 ENCOUNTER — Other Ambulatory Visit: Payer: Self-pay

## 2019-05-19 ENCOUNTER — Encounter: Payer: Self-pay | Admitting: Radiation Oncology

## 2019-05-19 ENCOUNTER — Ambulatory Visit
Admission: RE | Admit: 2019-05-19 | Discharge: 2019-05-19 | Disposition: A | Discharge status: Home / Self Care | Payer: Self-pay | Source: Ambulatory Visit | Attending: Radiation Oncology | Admitting: Radiation Oncology

## 2019-05-19 DIAGNOSIS — C50919 Malignant neoplasm of unspecified site of unspecified female breast: Secondary | ICD-10-CM | POA: Insufficient documentation

## 2019-05-19 DIAGNOSIS — C7951 Secondary malignant neoplasm of bone: Secondary | ICD-10-CM | POA: Insufficient documentation

## 2019-05-19 DIAGNOSIS — C7949 Secondary malignant neoplasm of other parts of nervous system: Secondary | ICD-10-CM

## 2019-05-19 DIAGNOSIS — C7931 Secondary malignant neoplasm of brain: Secondary | ICD-10-CM

## 2019-05-19 DIAGNOSIS — C50011 Malignant neoplasm of nipple and areola, right female breast: Secondary | ICD-10-CM

## 2019-05-19 DIAGNOSIS — Z170 Estrogen receptor positive status [ER+]: Secondary | ICD-10-CM

## 2019-05-19 MED ORDER — HYDROCODONE-ACETAMINOPHEN 7.5-325 MG/15ML PO SOLN: 15.0000 mL | ORAL | 0 refills | Status: AC | PRN

## 2019-05-19 NOTE — Progress Notes (Signed)
Radiation Oncology         [phone] ________________________________  Outpatient Reconsultation - Conducted via telephone due to current COVID-19 concerns for limiting patient exposure  I spoke with the patient to conduct this consult visit via telephone to spare the patient unnecessary potential exposure in the healthcare setting during the current COVID-19 pandemic. The patient was notified in advance and was offered a [facility] meeting to allow for face to face communication but unfortunately reported that they did not have the appropriate resources/technology to support such a visit and instead preferred to proceed with a telephone visit.   ________________________________  Name: Felicia [name] MRN: [id number]  Date of Service: 05/19/2019  DOB: [birth year]  Post Treatment Telephone Note  Diagnosis:      Recurrent metastatic Bilateral ER positive breast cancer  Interval Since Last Radiation:  2 months  02/16/19-03/01/2019:  1. Whole brain XRT to a dose of 30 Gy in 10 fractions. 2.  T-spine centered at T7 to a dose of 30 Gy in 10 fractions 3.  Right pelvis including the femur to a dose of 30 Gy in 10 fractions  05/06/2018 - 05/19/2018: The lumbar spine (L2-4) and left pelvis were treated to 30 Gy in 10 fractions of 3 Gy.  04/07/16 - 05/27/16: Left Chest Wall treated to 50.4 Gy in 28 fractions, and then Boosted an additional 10 Gy in 5 fractions. Right Chest Wall treated to 50.4 Gy in 28 fractions, and then Boosted an additional 10 Gy in 5 fractions.  Narrative:  Ms. Felicia [name] is a well know patient with a history of recurrent metastatic bilateral ER positive breast cancer with multiple brain and bone metastases. She has been on multiple courses of treatment with her most recent being Ibrance and letrozole, this was discontinued on 04/29/2018 due to disease progression that was seen on recent CT staging on 04/18/2019. Unfortunately the scan detected progressive  pulmonary findings, and multiple mixed lytic and blastic osseous lesions in the ribs, medial left scapula and spine, new marked compression fracture involving T7 was noted, she has new liver disease, new bilateral adrenal disease, and progressive multifocal ostial osseous disease in the left iliac bone, right femoral trochanter, L3 vertebral body and left acetabulum.  Given these findings she is contacted by phone today.  Also of note the patient was being referred to palliative care with the anticipation of hospice enrollment at the completion of radiotherapy.  There are notes from medical oncology stating that palliative care has been unsuccessful in getting in touch with the patient.    On review of systems, the patient reports with the assistance of a Spanish interpreter and her daughter as well on the phone, that she is having terrible trouble with standing and is having to lay flat most days because of the right hip pain on the right.  She is also having significant pain in her right side laterally below the breast area, she states that this bothers her when she takes a deep breath or coughs.  She also has been having progressive pain with swallowing for the last 2 weeks to the point where she is unable to take in much other than liquid, she has been unable to take pain medicine as a result of this.  Because of this she continues to lose weight.  She denies any bladder disturbances, and denies abdominal pain.  She has also been experiencing some vomiting but denies any hematemesis or hemoptysis.  She denies any other new musculoskeletal  or joint aches or pains, new skin lesions or concerns. A complete review of systems is obtained and is otherwise negative.  PAST MEDICAL HISTORY:  Past Medical History:  Diagnosis Date   Asthma    bilateral breast ca dx'd 2017   breast   GERD (gastroesophageal reflux disease)    Hypertension    Pulmonary nodule     PAST SURGICAL HISTORY: Past Surgical  History:  Procedure Laterality Date   MASTECTOMY MODIFIED RADICAL Bilateral 02/11/2016   MASTECTOMY MODIFIED RADICAL Bilateral 02/11/2016   Procedure: BILATERAL MASTECTOMY MODIFIED RADICAL;  Surgeon: Rolm Bookbinder, MD;  Location: [place];  Service: General;  Laterality: Bilateral;   PORTACATH PLACEMENT N/A 09/04/2015   Procedure: INSERTION PORT-A-CATH WITH [place];  Surgeon: Rolm Bookbinder, MD;  Location: [place];  Service: General;  Laterality: N/A;   TUBAL LIGATION     VIDEO BRONCHOSCOPY Bilateral 01/04/2019   Procedure: VIDEO BRONCHOSCOPY WITH FLUORO;  Surgeon: Rigoberto Noel, MD;  Location: [facility];  Service: Cardiopulmonary;  Laterality: Bilateral;    PAST SOCIAL HISTORY:  Social History   Socioeconomic History   Marital status: Married    Spouse name: Elon Jester Real   Number of children: 3   Years of education: Not on file   Highest education level: Not on file  Occupational History   Not on file  Tobacco Use   Smoking status: Never Smoker   Smokeless tobacco: Never Used  Substance and Sexual Activity   Alcohol use: No   Drug use: No   Sexual activity: Yes    Birth control/protection: Surgical  Other Topics Concern   Not on file  Social History Narrative   3 children    [name] age 67, lives in [place]    [name] age 68 lives in [place]   [name] age 86 lives in [place] Determinants of Health   Financial Resource Strain:    Difficulty of Paying Living Expenses: Not on file  Food Insecurity:    Worried About Charity fundraiser in the Last Year: Not on file   [employer] of Food in the Last Year: Not on file  Transportation Needs:    Lack of Transportation (Medical): Not on file   Lack of Transportation (Non-Medical): Not on file  Physical Activity:    Days of Exercise per Week: Not on file   Minutes of Exercise per Session: Not on file  Stress:    Feeling of Stress : Not on  file  Social Connections:    Frequency of Communication with Friends and Family: Not on file   Frequency of Social Gatherings with Friends and Family: Not on file   Attends Religious Services: Not on file   Active Member of Clubs or Organizations: Not on file   Attends Archivist Meetings: Not on file   Marital Status: Not on file  Intimate Partner Violence:    Fear of Current or Ex-Partner: Not on file   Emotionally Abused: Not on file   Physically Abused: Not on file   Sexually Abused: Not on file    PAST FAMILY HISTORY: Family History  Problem Relation Age of Onset   Diabetes Mother     MEDICATIONS  Current Outpatient Medications  Medication Sig Dispense Refill   albuterol (VENTOLIN HFA) 108 (90 Base) MCG/ACT inhaler Inhale 2 puffs into the lungs every 4 (four) hours as needed for wheezing or shortness of breath. 1 Inhaler 5  Calcium Carb-Cholecalciferol (CALCIUM 600+D) 600-800 MG-UNIT TABS Take 1 tablet by mouth daily.     Cholecalciferol (VITAMIN D-3) 5000 units TABS Take 1 tablet by mouth daily.     furosemide (LASIX) 20 MG tablet 20 mg once daily for 4 days for swelling, stop then restart if swelling returns 30 tablet 0   glucose blood (COOL BLOOD GLUCOSE TEST STRIPS) test strip Use as instructed 100 each 12   letrozole (FEMARA) 2.5 MG tablet Take 1 tablet (2.5 mg total) by mouth daily. 90 tablet 3   lidocaine-prilocaine (EMLA) cream Apply 1 application topically as needed (Apply 2- hours prior to access of port). 30 g 6   lisinopril (ZESTRIL) 20 MG tablet Take 1 tablet (20 mg total) by mouth daily. 30 tablet 6   metFORMIN (GLUMETZA) 1000 MG (MOD) 24 hr tablet Take 1 tablet (1,000 mg total) by mouth daily with breakfast. 30 tablet 3   ondansetron (ZOFRAN ODT) 8 MG disintegrating tablet Take 1 tablet (8 mg total) by mouth every 8 (eight) hours as needed for nausea or vomiting. 30 tablet 3   oxyCODONE-acetaminophen (PERCOCET/ROXICET) 5-325 MG  tablet Take 1 tablet by mouth every 6 (six) hours as needed for severe pain. (Patient not taking: Reported on 05/19/2019) 60 tablet 0   pantoprazole (PROTONIX) 40 MG tablet Take 1 tablet (40 mg total) by mouth daily. 30 tablet 3   polyethylene glycol powder (GLYCOLAX/MIRALAX) 17 GM/SCOOP powder Take 17 g by mouth daily. 3350 g 1   No current facility-administered medications for this encounter.   Facility-Administered Medications Ordered in Other Encounters  Medication Dose Route Frequency Provider Last Rate Last Admin   sodium chloride flush (NS) 0.9 % injection 10 mL  10 mL Intravenous PRN Nicholas Lose, MD   10 mL at 04/14/17 0831    ALLERGIES:  Allergies  Allergen Reactions   No Known Allergies    PHYSICAL EXAM: Unable to assess due to encounter type.  Impression/Plan: 1.    Recurrent metastatic Bilateral ER positive breast cancer. With the help of [facility] Interpretor line Dr. Lisbeth Renshaw and I were able to speak with the patient and her daughter.  The patient has multiple sites of pain, upon further evaluation of her course and recent imaging, Dr. Lisbeth Renshaw does not see any targets at the base of the neck or throat, or along the ribs on the right that would be a good target for treatment.  He does however state that there is progressive change within the pelvis and specifically the right acetabulum where the patient is having symptoms would be a location to consider palliative radiotherapy.  Because of her quality of life, the goals of treatment would be to reduce her pain and also prevent further compromise of the acetabulum which if left untreated could lead to fracture.  Hopefully this would allow the patient to be more mobile even though her prognosis is poor.  We will follow-up with Dr. Rudene Re nurse as well as the hospice care has been difficult to coordinate with.  I think that this is because the patient's daughter works third shift and is unable to take calls during the day.  The patient  will come in tomorrow and sign consent to proceed.  We did discuss the risks, benefits, short and long-term effects of radiotherapy to the acetabulum, and at the conclusion of the visit the patient would like to proceed.   Given current concerns for patient exposure during the COVID-19 pandemic, this encounter was conducted via telephone.  The patient has given verbal consent for this type of encounter. The time spent during this encounter was 45 minutes and 50% of that time was spent in the coordination of her care. The attendants for this meeting include Dr. Lisbeth Renshaw, Felicia [name], [facility], the interpretor from [facility], and Felicia [name] and her daughter Felicia [name]. During the encounter, Dr. Lisbeth Renshaw and Felicia [name] [facility] was located at [facility] Radiation Oncology Department. The Interpretor was a remote phone line. Toney Sang Dorice Lamas and her daughter Felicia [name] were located at home.  The above documentation reflects my direct findings during this shared patient visit. Please see the separate note by Dr. Lisbeth Renshaw on this date for the remainder of the patient's plan of care.     Carola Rhine, PAC

## 2019-05-20 ENCOUNTER — Encounter: Payer: Self-pay | Admitting: *Deleted

## 2019-05-20 ENCOUNTER — Other Ambulatory Visit: Payer: Self-pay

## 2019-05-20 ENCOUNTER — Ambulatory Visit
Admission: RE | Admit: 2019-05-20 | Discharge: 2019-05-20 | Disposition: A | Discharge status: Home / Self Care | Payer: Self-pay | Source: Ambulatory Visit | Attending: Radiation Oncology | Admitting: Radiation Oncology

## 2019-05-20 DIAGNOSIS — C7951 Secondary malignant neoplasm of bone: Secondary | ICD-10-CM

## 2019-05-20 NOTE — Progress Notes (Signed)
Received call from pt stating she has not heard from hospice when a referral was sent in December.  RN placed call to [facility] hosp[facility] and confirmed they did not receive the referral fax.  RN verbally called in referral for pt.

## 2019-05-20 NOTE — Progress Notes (Signed)
Referral called to [facility] [phone]) for hospice referral.

## 2019-05-24 ENCOUNTER — Ambulatory Visit: Payer: Self-pay | Admitting: Radiation Oncology

## 2019-05-25 ENCOUNTER — Ambulatory Visit: Payer: Self-pay

## 2019-05-26 ENCOUNTER — Ambulatory Visit: Payer: Self-pay

## 2019-05-27 ENCOUNTER — Ambulatory Visit: Payer: Self-pay

## 2019-05-30 ENCOUNTER — Ambulatory Visit: Payer: Self-pay

## 2019-05-31 ENCOUNTER — Ambulatory Visit: Payer: Self-pay

## 2019-06-01 ENCOUNTER — Ambulatory Visit: Payer: Self-pay

## 2019-06-02 ENCOUNTER — Ambulatory Visit: Payer: Self-pay

## 2019-06-03 ENCOUNTER — Ambulatory Visit: Admission: RE | Admit: 2019-06-03 | Payer: Self-pay | Source: Ambulatory Visit

## 2019-06-06 ENCOUNTER — Ambulatory Visit: Admission: RE | Admit: 2019-06-06 | Payer: Self-pay | Source: Ambulatory Visit

## 2019-06-13 NOTE — Telephone Encounter (Signed)
Ready to close °

## 2019-07-05 NOTE — Progress Notes (Signed)
  Radiation Oncology         [phone] ________________________________  Name: Felicia [name] MRN: [id number]  Date: 05/20/2019  DOB: January 28, 1961  SIMULATION AND TREATMENT PLANNING NOTE  DIAGNOSIS:     ICD-10-CM   1. Bone metastasis ([place])  C79.51      Site:  Right hip  NARRATIVE:  The patient was brought to the [facility].  Identity was confirmed.  All relevant records and images related to the planned course of therapy were reviewed.   Written consent to proceed with treatment was confirmed which was freely given after reviewing the details related to the planned course of therapy had been reviewed with the patient.  Then, the patient was set-up in a stable reproducible  supine position for radiation therapy.  CT images were obtained.  Surface markings were placed.    Medically necessary complex treatment device(s) for immobilization:  Vac-lock bag.   The CT images were loaded into the planning software.  Then the target and avoidance structures were contoured.  Treatment planning then occurred.  The radiation prescription was entered and confirmed.  A total of 2 complex treatment devices were fabricated which relate to the designed radiation treatment fields. Each of these customized fields/ complex treatment devices will be used on a daily basis during the radiation course. I have requested : Isodose Plan.   PLAN:  The patient will receive 20 Gy in 5 fractions.  ________________________________   Jodelle Gross, MD, PhD

## 2019-08-02 ENCOUNTER — Ambulatory Visit: Payer: Self-pay | Attending: Family Medicine | Admitting: Family Medicine

## 2019-08-02 ENCOUNTER — Other Ambulatory Visit: Payer: Self-pay

## 2020-10-21 IMAGING — CT CT ABDOMEN AND PELVIS WITH CONTRAST
3 of 5 series · 15 of 46 positions shown, 17 images · IV contrast (APPLIED)
Comparison: 04/26/2018

CLINICAL DATA: Follow-up bilateral breast cancer, status post
mastectomy, chemo XRT

EXAM:
CT CHEST, ABDOMEN, AND PELVIS WITH CONTRAST
TECHNIQUE: Multidetector CT imaging of the chest, abdomen and pelvis was
performed following the standard protocol during bolus
administration of intravenous contrast.
CONTRAST:  100mL OMNIPAQUE IOHEXOL 300 MG/ML SOLN, additional oral
enteric contrast

[Series 2: cap with · axial · 0.86mm/px · z∈[-546,-51]mm · 10 of 121 slices shown, 12 images]
[im 11/121  soft-tissue]
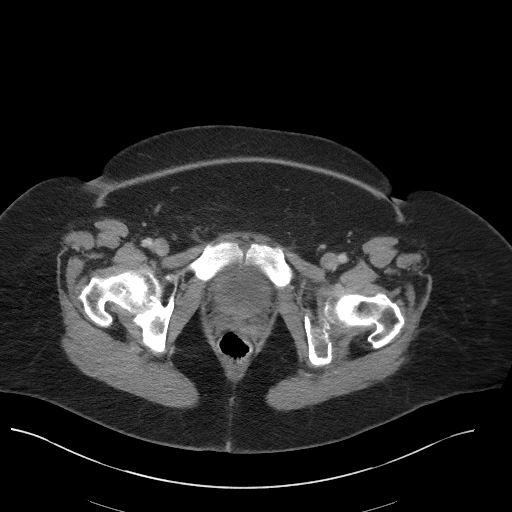
[im 11/121  bone]
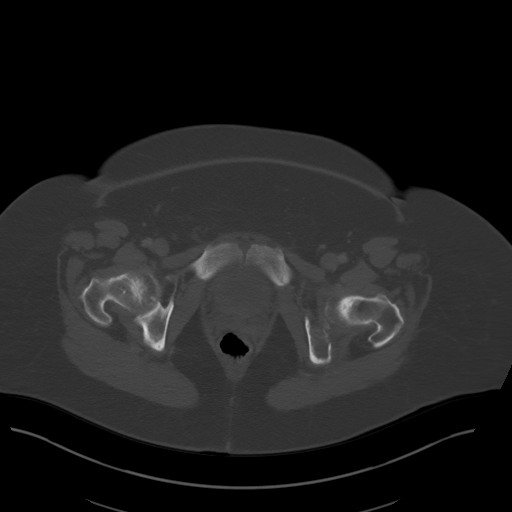
[im 22/121  soft-tissue]
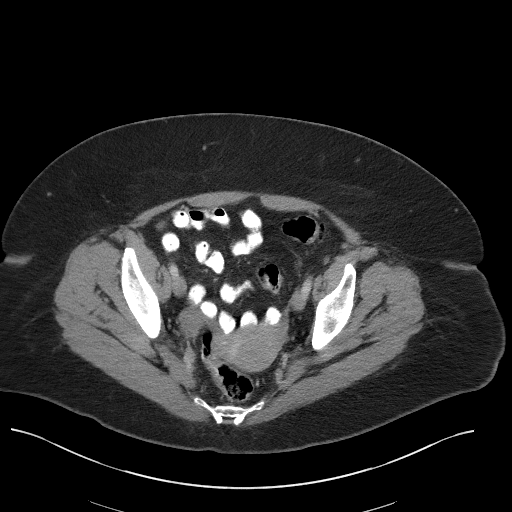
[im 33/121  soft-tissue]
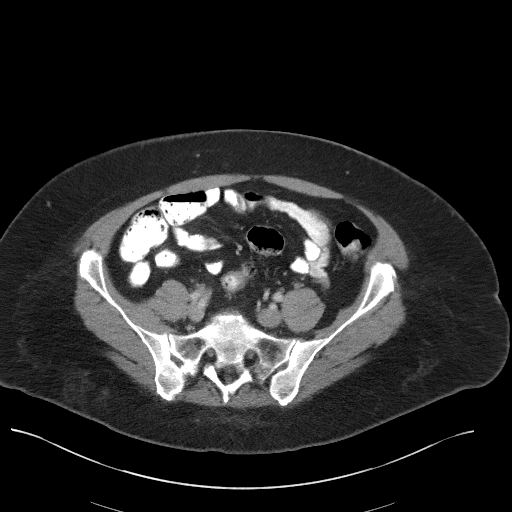
[im 44/121  soft-tissue]
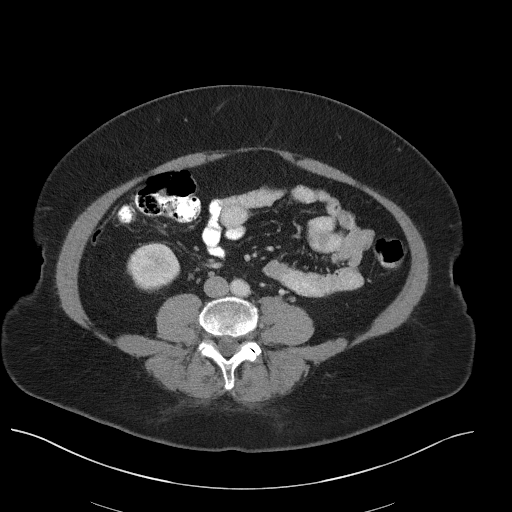
[im 55/121  soft-tissue]
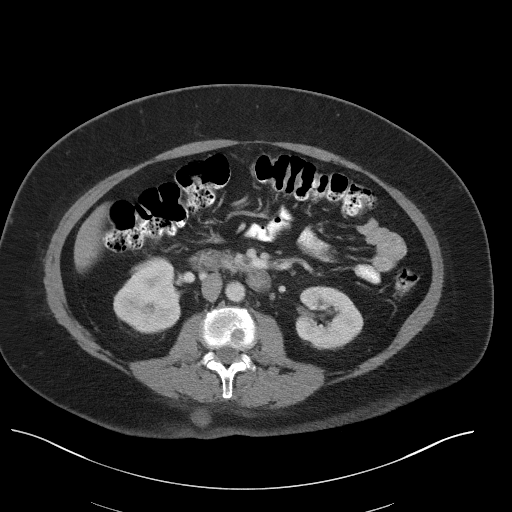
[im 66/121  soft-tissue]
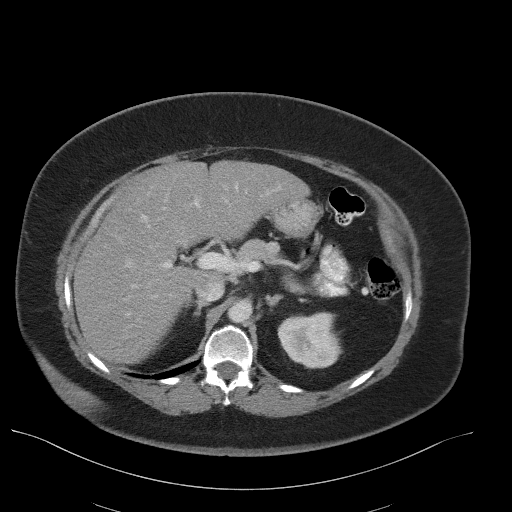
[im 77/121  soft-tissue]
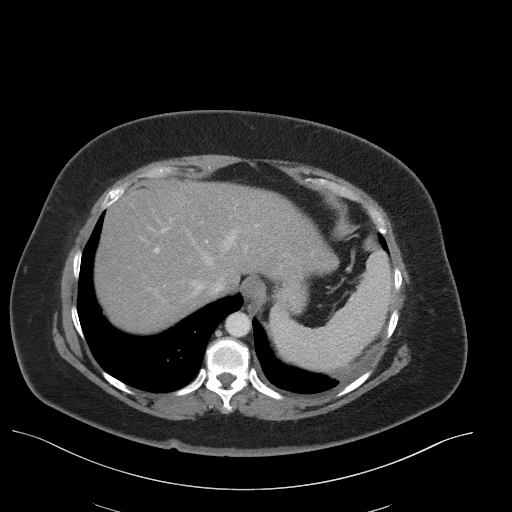
[im 88/121  soft-tissue]
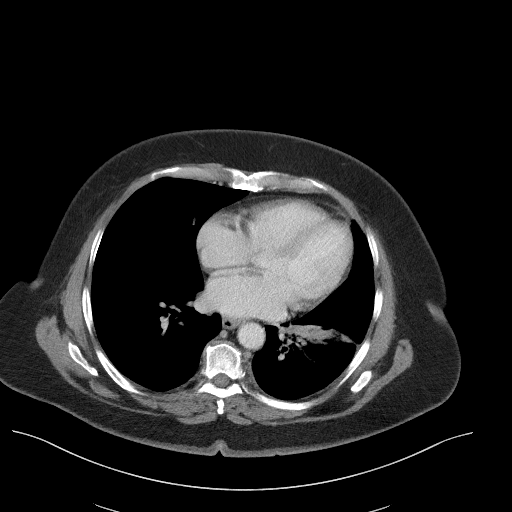
[im 99/121  soft-tissue]
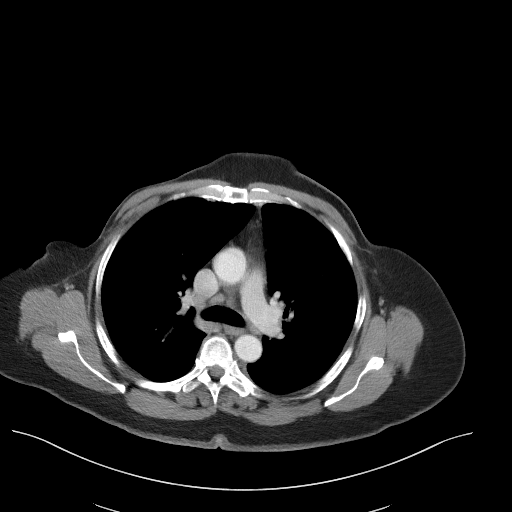
[im 99/121  bone]
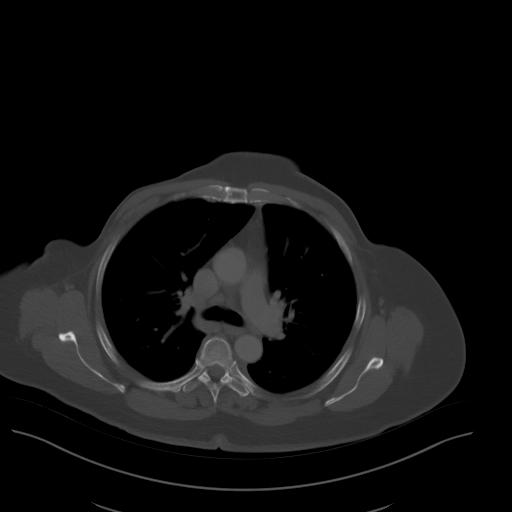
[im 110/121  soft-tissue]
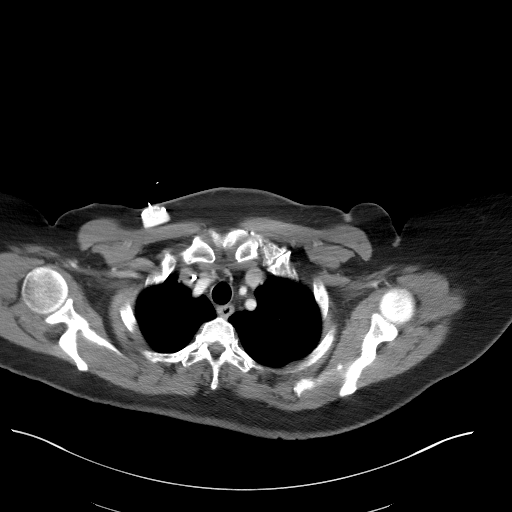

[Series 5: coronals · coronal · 0.85mm/px · 3 of 161 slices shown]
[im 54/161  soft-tissue]
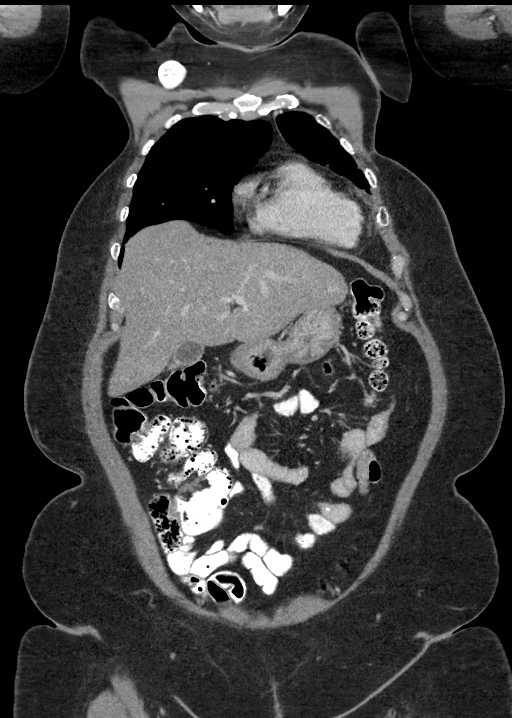
[im 72/161  soft-tissue]
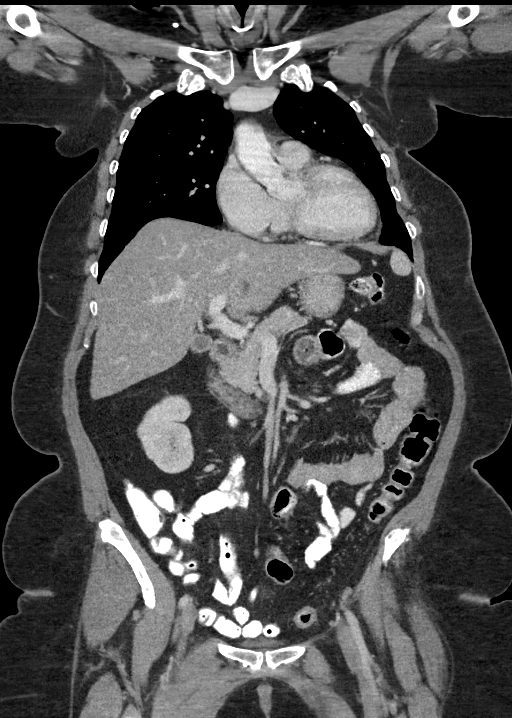
[im 89/161  soft-tissue]
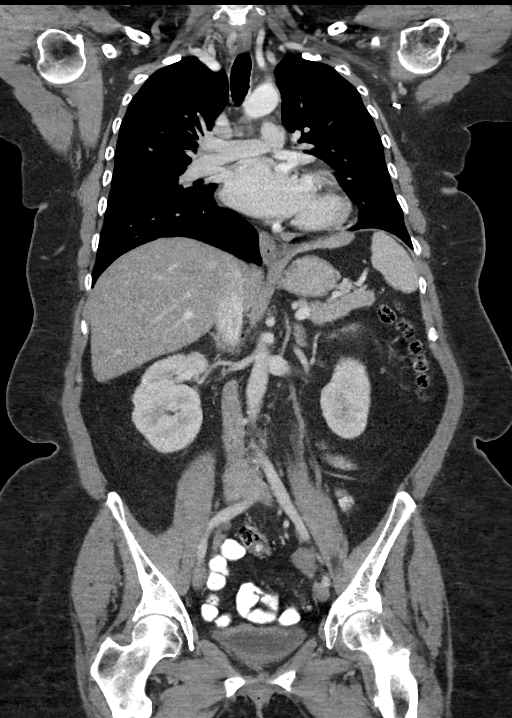

[Series 7: lung · axial · 0.86mm/px · z∈[-274,-234]mm · 2 of 149 slices shown]
[im 10/149  bone]
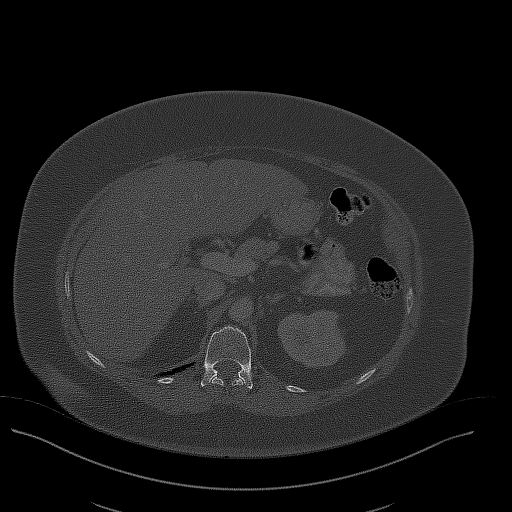
[im 30/149  bone]
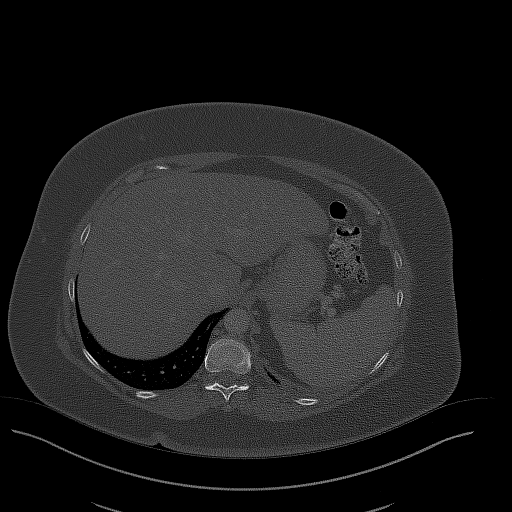

[15 of 46 positions shown; findings below may reference images not displayed]

FINDINGS: CT CHEST FINDINGS

Cardiovascular: No significant vascular findings. Normal heart size.
No pericardial effusion. Right internal jugular port catheter.

Mediastinum/Nodes: Redemonstrated prominent left hilar and
left-sided mediastinal and AP window lymph nodes, not significantly
changed compared to prior examination. Thyroid gland, trachea, and
esophagus demonstrate no significant findings.

Lungs/Pleura: Interval increase in left infrahilar airspace disease,
with interval development of new subpleural rounded heterogeneous
opacities with central clearing in the left lung base (series 7,
image 102). The left lower lobe pulmonary artery appears obstructed
by mass (series 2, image 29). Stable small pulmonary nodules
elsewhere, for example a 4 mm nodule of the right upper lobe (series
7, image 50).

Musculoskeletal: Status post bilateral mastectomy.

CT ABDOMEN PELVIS FINDINGS

Hepatobiliary: No focal liver abnormality is seen. No gallstones,
gallbladder wall thickening, or biliary dilatation.

Pancreas: Unremarkable. No pancreatic ductal dilatation or
surrounding inflammatory changes.

Spleen: Normal in size without focal abnormality.

Adrenals/Urinary Tract: Adrenal glands are unremarkable. Kidneys are
normal, without renal calculi, focal lesion, or hydronephrosis.
Bladder is unremarkable.

Stomach/Bowel: Stomach is within normal limits. Appendix appears
normal. No evidence of bowel wall thickening, distention, or
inflammatory changes.

Vascular/Lymphatic: No significant vascular findings are present. No
enlarged abdominal or pelvic lymph nodes.

Reproductive: No mass or other abnormality.

Other: No abdominal wall hernia or abnormality. No abdominopelvic
ascites.

Musculoskeletal: Redemonstrated lytic metastatic lesion of the
posterior left acetabulum (series 2, image 108) and of the left
aspect of the L3 vertebral body (series 2, image 69).
IMPRESSION: 1. Interval increase in left infrahilar airspace disease, with
interval development of new subpleural rounded heterogeneous
opacities with central clearing in the left lung base (series 7,
image 102). Findings are concerning for worsening metastatic
malignancy. The left lower lobe pulmonary artery appears obstructed
by mass (series 2, image 29), raising concern for pulmonary
infarctions.

2. Additional stable small pulmonary nodules. Attention on
follow-up.

3. Redemonstrated lytic metastatic lesion of the posterior left
acetabulum (series 2, image 108) and of the left aspect of the L3
vertebral body (series 2, image 69).

4.  Postoperative findings of bilateral mastectomy.
